# Patient Record
Sex: Female | Born: 1965 | Race: White | Hispanic: No | Marital: Married | Smoking: Never smoker
Health system: Southern US, Community
[De-identification: ages and names within clinical notes are randomized; demographics above are authoritative.]

## PROBLEM LIST (undated history)

## (undated) DIAGNOSIS — I10 Essential (primary) hypertension: Secondary | ICD-10-CM

## (undated) DIAGNOSIS — L309 Dermatitis, unspecified: Secondary | ICD-10-CM

## (undated) DIAGNOSIS — B2790 Infectious mononucleosis, unspecified without complication: Secondary | ICD-10-CM

## (undated) DIAGNOSIS — R011 Cardiac murmur, unspecified: Secondary | ICD-10-CM

## (undated) DIAGNOSIS — G4730 Sleep apnea, unspecified: Secondary | ICD-10-CM

## (undated) DIAGNOSIS — D649 Anemia, unspecified: Secondary | ICD-10-CM

## (undated) DIAGNOSIS — T8859XA Other complications of anesthesia, initial encounter: Secondary | ICD-10-CM

## (undated) DIAGNOSIS — K649 Unspecified hemorrhoids: Secondary | ICD-10-CM

## (undated) DIAGNOSIS — T783XXA Angioneurotic edema, initial encounter: Secondary | ICD-10-CM

## (undated) DIAGNOSIS — K760 Fatty (change of) liver, not elsewhere classified: Secondary | ICD-10-CM

## (undated) DIAGNOSIS — K5790 Diverticulosis of intestine, part unspecified, without perforation or abscess without bleeding: Secondary | ICD-10-CM

## (undated) DIAGNOSIS — Z8489 Family history of other specified conditions: Secondary | ICD-10-CM

## (undated) DIAGNOSIS — I4891 Unspecified atrial fibrillation: Secondary | ICD-10-CM

## (undated) DIAGNOSIS — G709 Myoneural disorder, unspecified: Secondary | ICD-10-CM

## (undated) DIAGNOSIS — E039 Hypothyroidism, unspecified: Secondary | ICD-10-CM

## (undated) DIAGNOSIS — M1990 Unspecified osteoarthritis, unspecified site: Secondary | ICD-10-CM

## (undated) DIAGNOSIS — R112 Nausea with vomiting, unspecified: Secondary | ICD-10-CM

## (undated) DIAGNOSIS — T4145XA Adverse effect of unspecified anesthetic, initial encounter: Secondary | ICD-10-CM

## (undated) DIAGNOSIS — E119 Type 2 diabetes mellitus without complications: Secondary | ICD-10-CM

## (undated) DIAGNOSIS — T7840XA Allergy, unspecified, initial encounter: Secondary | ICD-10-CM

## (undated) DIAGNOSIS — Z9889 Other specified postprocedural states: Secondary | ICD-10-CM

## (undated) HISTORY — PX: MICRODISCECTOMY LUMBAR: SUR864

## (undated) HISTORY — PX: FINGER SURGERY: SHX640

## (undated) HISTORY — DX: Myoneural disorder, unspecified: G70.9

## (undated) HISTORY — DX: Unspecified osteoarthritis, unspecified site: M19.90

## (undated) HISTORY — DX: Fatty (change of) liver, not elsewhere classified: K76.0

## (undated) HISTORY — DX: Angioneurotic edema, initial encounter: T78.3XXA

## (undated) HISTORY — DX: Allergy, unspecified, initial encounter: T78.40XA

## (undated) HISTORY — PX: WISDOM TOOTH EXTRACTION: SHX21

---

## 2004-05-11 ENCOUNTER — Ambulatory Visit: Payer: Self-pay | Admitting: Internal Medicine

## 2005-11-01 ENCOUNTER — Ambulatory Visit: Payer: Self-pay | Admitting: Internal Medicine

## 2005-11-21 ENCOUNTER — Ambulatory Visit: Payer: Self-pay | Admitting: Internal Medicine

## 2005-12-16 ENCOUNTER — Ambulatory Visit: Payer: Self-pay | Admitting: Urology

## 2006-10-31 ENCOUNTER — Ambulatory Visit: Payer: Self-pay | Admitting: Internal Medicine

## 2007-10-25 ENCOUNTER — Ambulatory Visit: Payer: Self-pay | Admitting: Internal Medicine

## 2008-11-25 ENCOUNTER — Ambulatory Visit: Payer: Self-pay | Admitting: Internal Medicine

## 2010-04-13 ENCOUNTER — Ambulatory Visit: Payer: Self-pay | Admitting: Internal Medicine

## 2010-11-08 ENCOUNTER — Ambulatory Visit: Payer: Self-pay | Admitting: Unknown Physician Specialty

## 2010-11-18 ENCOUNTER — Ambulatory Visit: Payer: Self-pay | Admitting: Unknown Physician Specialty

## 2010-11-22 LAB — PATHOLOGY REPORT

## 2010-12-16 LAB — HM COLONOSCOPY

## 2011-01-26 LAB — HM MAMMOGRAPHY

## 2011-06-14 ENCOUNTER — Ambulatory Visit: Payer: Self-pay | Admitting: Internal Medicine

## 2011-11-29 ENCOUNTER — Ambulatory Visit: Payer: Self-pay | Admitting: General Practice

## 2011-11-30 ENCOUNTER — Ambulatory Visit: Payer: Self-pay | Admitting: General Practice

## 2011-11-30 ENCOUNTER — Telehealth: Payer: Self-pay | Admitting: Internal Medicine

## 2011-11-30 NOTE — Telephone Encounter (Signed)
Pt dropped off note of her MRI and wanted you to be up dated on what was going on. Put paper in your inbox up front.

## 2011-12-01 NOTE — Telephone Encounter (Signed)
Received MRI results.  It looks like Dr Dorothey Baseman ordered.  Per note it states see ortho or a spinal surgeon.  Did her refer her to a Careers adviser.

## 2011-12-06 NOTE — Telephone Encounter (Signed)
Called pt at home and left a message

## 2011-12-07 ENCOUNTER — Other Ambulatory Visit: Payer: Self-pay | Admitting: Neurosurgery

## 2011-12-08 ENCOUNTER — Telehealth: Payer: Self-pay | Admitting: Internal Medicine

## 2011-12-08 NOTE — Telephone Encounter (Signed)
Caller: Ximena/Patient; Patient Name: Joy Houston; PCP: Dale Ceresco; Best Callback Phone Number: [phone] Got message  on home phone to call office to speak with Kourtnei, is returning call....unsure what this is about, does know she dropped off [employer] films in office last week.  Has back surgery scheduled 10/24. Patient can be reached back at  [phone].

## 2011-12-09 NOTE — Telephone Encounter (Signed)
Pt aware of results 

## 2011-12-13 ENCOUNTER — Encounter (HOSPITAL_COMMUNITY): Payer: Self-pay | Admitting: Pharmacy Technician

## 2011-12-14 ENCOUNTER — Ambulatory Visit (HOSPITAL_COMMUNITY)
Admission: RE | Admit: 2011-12-14 | Discharge: 2011-12-14 | Disposition: A | Discharge status: Home / Self Care | Payer: BC Managed Care – PPO | Source: Ambulatory Visit | Attending: Neurosurgery | Admitting: Neurosurgery

## 2011-12-14 ENCOUNTER — Encounter (HOSPITAL_COMMUNITY)
Admission: RE | Admit: 2011-12-14 | Discharge: 2011-12-14 | Disposition: A | Discharge status: Home / Self Care | Payer: BC Managed Care – PPO | Source: Ambulatory Visit | Attending: Neurosurgery | Admitting: Neurosurgery

## 2011-12-14 ENCOUNTER — Encounter (HOSPITAL_COMMUNITY): Payer: Self-pay

## 2011-12-14 DIAGNOSIS — Z01818 Encounter for other preprocedural examination: Secondary | ICD-10-CM | POA: Insufficient documentation

## 2011-12-14 HISTORY — DX: Essential (primary) hypertension: I10

## 2011-12-14 HISTORY — DX: Other complications of anesthesia, initial encounter: T88.59XA

## 2011-12-14 HISTORY — DX: Dermatitis, unspecified: L30.9

## 2011-12-14 HISTORY — DX: Other specified postprocedural states: Z98.890

## 2011-12-14 HISTORY — DX: Cardiac murmur, unspecified: R01.1

## 2011-12-14 HISTORY — DX: Unspecified hemorrhoids: K64.9

## 2011-12-14 HISTORY — DX: Family history of other specified conditions: Z84.89

## 2011-12-14 HISTORY — DX: Nausea with vomiting, unspecified: R11.2

## 2011-12-14 HISTORY — DX: Adverse effect of unspecified anesthetic, initial encounter: T41.45XA

## 2011-12-14 LAB — CBC
MCH: 30.6 pg (ref 26.0–34.0)
MCHC: 34.6 g/dL (ref 30.0–36.0)
MCV: 88.5 fL (ref 78.0–100.0)
Platelets: 258 10*3/uL (ref 150–400)
RDW: 13.2 % (ref 11.5–15.5)
WBC: 11.4 10*3/uL — ABNORMAL HIGH (ref 4.0–10.5)

## 2011-12-14 LAB — BASIC METABOLIC PANEL
CO2: 26 mEq/L (ref 19–32)
Calcium: 9.7 mg/dL (ref 8.4–10.5)
Creatinine, Ser: 0.64 mg/dL (ref 0.50–1.10)

## 2011-12-14 LAB — SURGICAL PCR SCREEN: Staphylococcus aureus: NEGATIVE

## 2011-12-14 MED ORDER — CEFAZOLIN SODIUM-DEXTROSE 2-3 GM-% IV SOLR
2.0000 g | INTRAVENOUS | Status: DC
  Administered 2011-12-15: 2 g via INTRAVENOUS
  Filled 2011-12-14: qty 50

## 2011-12-14 NOTE — Progress Notes (Signed)
Mrs Farabee said that she is having a Microdiskectomy and added the word " microdiskectomy" and signed her initials.

## 2011-12-14 NOTE — Pre-Procedure Instructions (Addendum)
[name]  12/14/2011   Your procedure is scheduled on:  Thursday, October 24th.  Report to [facility] at 8:45  AM.  Call this number if you have problems the morning of surgery: [phone]   Remember:   Do not eat food or anything to drink:After Midnight.      Take these medicines the morning of surgery with A SIP OF WATER: Hydrocodone-Acetamiohen (Norco) if needed.   Stop taking any Aspirin, Coumadin, Plavixc, Effient,NSAIDs  or Herbal Medications.  Do not wear jewelry, make-up or nail polish.  Do not wear lotions, powders, or perfumes. You may wear deodorant.  Do not shave 48 hours prior to surgery. Men may shave face and neck.  Do not bring valuables to the hospital.  Contacts, dentures or bridgework may not be worn into surgery.  Leave suitcase in the car. After surgery it may be brought to your room.  For patients admitted to the hospital, checkout time is 11:00 AM the day of discharge.   Patients discharged the day of surgery will not be allowed to drive home.  Name and phone number of your driver: NA  Special Instructions: Shower using CHG 2 nights before surgery and the night before surgery.  If you shower the day of surgery use CHG.  Use special wash - you have one bottle of CHG for all showers.  You should use approximately 1/3 of the bottle for each shower.   Please read over the following fact sheets that you were given: Pain Booklet, Coughing and Deep Breathing and Surgical Site Infection Prevention

## 2011-12-15 ENCOUNTER — Encounter (HOSPITAL_COMMUNITY)
Admission: RE | Disposition: A | Discharge status: Home / Self Care | Payer: Self-pay | Source: Ambulatory Visit | Attending: Neurosurgery

## 2011-12-15 ENCOUNTER — Encounter (HOSPITAL_COMMUNITY): Payer: Self-pay | Admitting: *Deleted

## 2011-12-15 ENCOUNTER — Encounter (HOSPITAL_COMMUNITY): Payer: Self-pay | Admitting: Anesthesiology

## 2011-12-15 ENCOUNTER — Observation Stay (HOSPITAL_COMMUNITY)
Admission: RE | Admit: 2011-12-15 | Discharge: 2011-12-16 | Disposition: A | Discharge status: Home / Self Care | Payer: BC Managed Care – PPO | Source: Ambulatory Visit | Attending: Neurosurgery | Admitting: Neurosurgery

## 2011-12-15 ENCOUNTER — Ambulatory Visit (HOSPITAL_COMMUNITY): Payer: BC Managed Care – PPO | Admitting: *Deleted

## 2011-12-15 ENCOUNTER — Ambulatory Visit (HOSPITAL_COMMUNITY): Payer: BC Managed Care – PPO

## 2011-12-15 DIAGNOSIS — Z01812 Encounter for preprocedural laboratory examination: Secondary | ICD-10-CM | POA: Insufficient documentation

## 2011-12-15 DIAGNOSIS — I10 Essential (primary) hypertension: Secondary | ICD-10-CM | POA: Insufficient documentation

## 2011-12-15 DIAGNOSIS — Z01810 Encounter for preprocedural cardiovascular examination: Secondary | ICD-10-CM | POA: Insufficient documentation

## 2011-12-15 DIAGNOSIS — Z01818 Encounter for other preprocedural examination: Secondary | ICD-10-CM | POA: Insufficient documentation

## 2011-12-15 DIAGNOSIS — M5126 Other intervertebral disc displacement, lumbar region: Principal | ICD-10-CM

## 2011-12-15 HISTORY — PX: LUMBAR LAMINECTOMY/DECOMPRESSION MICRODISCECTOMY: SHX5026

## 2011-12-15 SURGERY — LUMBAR LAMINECTOMY/DECOMPRESSION MICRODISCECTOMY 1 LEVEL
Anesthesia: General | Site: Spine Lumbar | Laterality: Right | Wound class: Clean

## 2011-12-15 MED ORDER — PHENYLEPHRINE HCL 10 MG/ML IJ SOLN
INTRAMUSCULAR | Status: DC | PRN
  Administered 2011-12-15: 80 ug via INTRAVENOUS
  Administered 2011-12-15 (×2): 40 ug via INTRAVENOUS

## 2011-12-15 MED ORDER — ACETAMINOPHEN 650 MG RE SUPP: 650.0000 mg | RECTAL | Status: DC | PRN

## 2011-12-15 MED ORDER — HYDROMORPHONE HCL PF 1 MG/ML IJ SOLN
INTRAMUSCULAR | Status: AC
  Filled 2011-12-15: qty 1

## 2011-12-15 MED ORDER — SODIUM CHLORIDE 0.9 % IV SOLN
INTRAVENOUS | Status: AC
  Filled 2011-12-15: qty 500

## 2011-12-15 MED ORDER — DOCUSATE SODIUM 100 MG PO CAPS
100.0000 mg | ORAL_CAPSULE | Freq: Two times a day (BID) | ORAL | Status: DC
  Administered 2011-12-15: 100 mg via ORAL
  Filled 2011-12-15: qty 1

## 2011-12-15 MED ORDER — SUFENTANIL CITRATE 50 MCG/ML IV SOLN
INTRAVENOUS | Status: DC | PRN
  Administered 2011-12-15 (×3): 10 ug via INTRAVENOUS
  Administered 2011-12-15: 20 ug via INTRAVENOUS

## 2011-12-15 MED ORDER — HYDROMORPHONE HCL PF 1 MG/ML IJ SOLN: 0.5000 mg | INTRAMUSCULAR | Status: DC | PRN

## 2011-12-15 MED ORDER — SODIUM CHLORIDE 0.9 % IR SOLN
Status: DC | PRN
  Administered 2011-12-15: 14:00:00

## 2011-12-15 MED ORDER — EPHEDRINE SULFATE 50 MG/ML IJ SOLN
INTRAMUSCULAR | Status: DC | PRN
  Administered 2011-12-15: 5 mg via INTRAVENOUS
  Administered 2011-12-15: 10 mg via INTRAVENOUS

## 2011-12-15 MED ORDER — ACETAMINOPHEN 325 MG PO TABS: 650.0000 mg | ORAL_TABLET | ORAL | Status: DC | PRN

## 2011-12-15 MED ORDER — ZOLPIDEM TARTRATE 5 MG PO TABS: 5.0000 mg | ORAL_TABLET | Freq: Every evening | ORAL | Status: DC | PRN

## 2011-12-15 MED ORDER — PROPOFOL 10 MG/ML IV BOLUS
INTRAVENOUS | Status: DC | PRN
  Administered 2011-12-15: 200 mg via INTRAVENOUS

## 2011-12-15 MED ORDER — HEMOSTATIC AGENTS (NO CHARGE) OPTIME
TOPICAL | Status: DC | PRN
  Administered 2011-12-15: 1 via TOPICAL

## 2011-12-15 MED ORDER — BUPIVACAINE-EPINEPHRINE PF 0.5-1:200000 % IJ SOLN
INTRAMUSCULAR | Status: DC | PRN
  Administered 2011-12-15: 10 mL

## 2011-12-15 MED ORDER — ROCURONIUM BROMIDE 100 MG/10ML IV SOLN
INTRAVENOUS | Status: DC | PRN
  Administered 2011-12-15: 50 mg via INTRAVENOUS

## 2011-12-15 MED ORDER — ONDANSETRON HCL 4 MG/2ML IJ SOLN: 4.0000 mg | INTRAMUSCULAR | Status: DC | PRN

## 2011-12-15 MED ORDER — HYDROCHLOROTHIAZIDE 25 MG PO TABS
25.0000 mg | ORAL_TABLET | Freq: Every day | ORAL | Status: DC
  Filled 2011-12-15 (×2): qty 1

## 2011-12-15 MED ORDER — ONDANSETRON HCL 4 MG/2ML IJ SOLN
INTRAMUSCULAR | Status: DC | PRN
  Administered 2011-12-15: 4 mg via INTRAVENOUS

## 2011-12-15 MED ORDER — 0.9 % SODIUM CHLORIDE (POUR BTL) OPTIME
TOPICAL | Status: DC | PRN
  Administered 2011-12-15: 1000 mL

## 2011-12-15 MED ORDER — BACITRACIN 50000 UNITS IM SOLR
INTRAMUSCULAR | Status: AC
  Filled 2011-12-15: qty 1

## 2011-12-15 MED ORDER — HYDROMORPHONE HCL PF 1 MG/ML IJ SOLN
0.2500 mg | INTRAMUSCULAR | Status: DC | PRN
  Administered 2011-12-15 (×2): 0.5 mg via INTRAVENOUS

## 2011-12-15 MED ORDER — THROMBIN 5000 UNITS EX KIT
PACK | CUTANEOUS | Status: DC | PRN
  Administered 2011-12-15 (×2): 5000 [IU] via TOPICAL

## 2011-12-15 MED ORDER — BACITRACIN ZINC 500 UNIT/GM EX OINT
TOPICAL_OINTMENT | CUTANEOUS | Status: DC | PRN
  Administered 2011-12-15: 1 via TOPICAL

## 2011-12-15 MED ORDER — HYDROCODONE-ACETAMINOPHEN 5-325 MG PO TABS
1.0000 | ORAL_TABLET | ORAL | Status: DC | PRN
  Administered 2011-12-15 – 2011-12-16 (×5): 1 via ORAL
  Filled 2011-12-15 (×5): qty 1

## 2011-12-15 MED ORDER — ONDANSETRON HCL 4 MG/2ML IJ SOLN: 4.0000 mg | Freq: Once | INTRAMUSCULAR | Status: DC | PRN

## 2011-12-15 MED ORDER — OXYCODONE-ACETAMINOPHEN 5-325 MG PO TABS: 1.0000 | ORAL_TABLET | ORAL | Status: DC | PRN

## 2011-12-15 MED ORDER — DROPERIDOL 2.5 MG/ML IJ SOLN
INTRAMUSCULAR | Status: DC | PRN
  Administered 2011-12-15: 0.625 mg via INTRAVENOUS

## 2011-12-15 MED ORDER — NEOSTIGMINE METHYLSULFATE 1 MG/ML IJ SOLN
INTRAMUSCULAR | Status: DC | PRN
  Administered 2011-12-15: 3 mg via INTRAVENOUS

## 2011-12-15 MED ORDER — LACTATED RINGERS IV SOLN
INTRAVENOUS | Status: DC | PRN
  Administered 2011-12-15 (×2): via INTRAVENOUS

## 2011-12-15 MED ORDER — LACTATED RINGERS IV SOLN: INTRAVENOUS | Status: DC

## 2011-12-15 MED ORDER — LISINOPRIL 10 MG PO TABS
10.0000 mg | ORAL_TABLET | Freq: Every day | ORAL | Status: DC
  Filled 2011-12-15 (×2): qty 1

## 2011-12-15 MED ORDER — DIAZEPAM 5 MG PO TABS: 5.0000 mg | ORAL_TABLET | Freq: Four times a day (QID) | ORAL | Status: DC | PRN

## 2011-12-15 MED ORDER — PHENOL 1.4 % MT LIQD: 1.0000 | OROMUCOSAL | Status: DC | PRN

## 2011-12-15 MED ORDER — MIDAZOLAM HCL 5 MG/5ML IJ SOLN
INTRAMUSCULAR | Status: DC | PRN
  Administered 2011-12-15: 2 mg via INTRAVENOUS

## 2011-12-15 MED ORDER — GLYCOPYRROLATE 0.2 MG/ML IJ SOLN
INTRAMUSCULAR | Status: DC | PRN
  Administered 2011-12-15: 0.4 mg via INTRAVENOUS

## 2011-12-15 MED ORDER — MENTHOL 3 MG MT LOZG
1.0000 | LOZENGE | OROMUCOSAL | Status: DC | PRN
  Administered 2011-12-15: 3 mg via ORAL
  Filled 2011-12-15: qty 9

## 2011-12-15 MED ORDER — LIDOCAINE HCL 4 % MT SOLN
OROMUCOSAL | Status: DC | PRN
  Administered 2011-12-15: 4 mL via TOPICAL

## 2011-12-15 MED ORDER — CEFAZOLIN SODIUM-DEXTROSE 2-3 GM-% IV SOLR
2.0000 g | Freq: Three times a day (TID) | INTRAVENOUS | Status: AC
  Administered 2011-12-15 – 2011-12-16 (×2): 2 g via INTRAVENOUS
  Filled 2011-12-15 (×2): qty 50

## 2011-12-15 MED ORDER — LIDOCAINE HCL (CARDIAC) 20 MG/ML IV SOLN
INTRAVENOUS | Status: DC | PRN
  Administered 2011-12-15: 80 mg via INTRAVENOUS

## 2011-12-15 SURGICAL SUPPLY — 56 items
BAG DECANTER FOR FLEXI CONT (MISCELLANEOUS) ×2 IMPLANT
BENZOIN TINCTURE PRP APPL 2/3 (GAUZE/BANDAGES/DRESSINGS) ×2 IMPLANT
BLADE SURG ROTATE 9660 (MISCELLANEOUS) ×2 IMPLANT
BRUSH SCRUB EZ PLAIN DRY (MISCELLANEOUS) ×2 IMPLANT
BUR ACORN 6.0 (BURR) ×2 IMPLANT
BUR MATCHSTICK NEURO 3.0 LAGG (BURR) ×2 IMPLANT
CANISTER SUCTION 2500CC (MISCELLANEOUS) ×2 IMPLANT
CLOTH BEACON ORANGE TIMEOUT ST (SAFETY) ×2 IMPLANT
CONT SPEC 4OZ CLIKSEAL STRL BL (MISCELLANEOUS) ×2 IMPLANT
DRAPE LAPAROTOMY 100X72X124 (DRAPES) ×2 IMPLANT
DRAPE MICROSCOPE LEICA (MISCELLANEOUS) ×2 IMPLANT
DRAPE POUCH INSTRU U-SHP 10X18 (DRAPES) ×2 IMPLANT
DRAPE SURG 17X23 STRL (DRAPES) ×8 IMPLANT
ELECT BLADE 4.0 EZ CLEAN MEGAD (MISCELLANEOUS) ×2
ELECT BLADE 6.5 EXT (BLADE) ×2 IMPLANT
ELECT REM PT RETURN 9FT ADLT (ELECTROSURGICAL) ×2
ELECTRODE BLDE 4.0 EZ CLN MEGD (MISCELLANEOUS) ×1 IMPLANT
ELECTRODE REM PT RTRN 9FT ADLT (ELECTROSURGICAL) ×1 IMPLANT
GAUZE SPONGE 4X4 16PLY XRAY LF (GAUZE/BANDAGES/DRESSINGS) IMPLANT
GLOVE BIO SURGEON STRL SZ8.5 (GLOVE) ×2 IMPLANT
GLOVE BIOGEL PI IND STRL 7.0 (GLOVE) ×1 IMPLANT
GLOVE BIOGEL PI IND STRL 8 (GLOVE) ×1 IMPLANT
GLOVE BIOGEL PI INDICATOR 7.0 (GLOVE) ×1
GLOVE BIOGEL PI INDICATOR 8 (GLOVE) ×1
GLOVE ECLIPSE 7.5 STRL STRAW (GLOVE) ×2 IMPLANT
GLOVE EXAM NITRILE LRG STRL (GLOVE) IMPLANT
GLOVE EXAM NITRILE MD LF STRL (GLOVE) ×2 IMPLANT
GLOVE EXAM NITRILE XL STR (GLOVE) IMPLANT
GLOVE EXAM NITRILE XS STR PU (GLOVE) IMPLANT
GLOVE SS BIOGEL STRL SZ 8 (GLOVE) ×1 IMPLANT
GLOVE SUPERSENSE BIOGEL SZ 8 (GLOVE) ×1
GLOVE SURG SS PI 6.5 STRL IVOR (GLOVE) ×4 IMPLANT
GOWN BRE IMP SLV AUR LG STRL (GOWN DISPOSABLE) ×4 IMPLANT
GOWN BRE IMP SLV AUR XL STRL (GOWN DISPOSABLE) ×2 IMPLANT
GOWN STRL REIN 2XL LVL4 (GOWN DISPOSABLE) IMPLANT
KIT BASIN OR (CUSTOM PROCEDURE TRAY) ×2 IMPLANT
KIT ROOM TURNOVER OR (KITS) ×2 IMPLANT
NEEDLE HYPO 21X1.5 SAFETY (NEEDLE) IMPLANT
NEEDLE HYPO 22GX1.5 SAFETY (NEEDLE) ×2 IMPLANT
NS IRRIG 1000ML POUR BTL (IV SOLUTION) ×2 IMPLANT
PACK LAMINECTOMY NEURO (CUSTOM PROCEDURE TRAY) ×2 IMPLANT
PAD ARMBOARD 7.5X6 YLW CONV (MISCELLANEOUS) ×10 IMPLANT
PATTIES SURGICAL .5 X1 (DISPOSABLE) IMPLANT
RUBBERBAND STERILE (MISCELLANEOUS) ×4 IMPLANT
SPONGE GAUZE 4X4 12PLY (GAUZE/BANDAGES/DRESSINGS) ×2 IMPLANT
SPONGE SURGIFOAM ABS GEL SZ50 (HEMOSTASIS) ×2 IMPLANT
STRIP CLOSURE SKIN 1/2X4 (GAUZE/BANDAGES/DRESSINGS) ×2 IMPLANT
SUT VIC AB 1 CT1 18XBRD ANBCTR (SUTURE) ×1 IMPLANT
SUT VIC AB 1 CT1 8-18 (SUTURE) ×1
SUT VIC AB 2-0 CP2 18 (SUTURE) ×2 IMPLANT
SYR 20CC LL (SYRINGE) IMPLANT
SYR 20ML ECCENTRIC (SYRINGE) ×2 IMPLANT
TAPE CLOTH SURG 4X10 WHT LF (GAUZE/BANDAGES/DRESSINGS) ×2 IMPLANT
TOWEL OR 17X24 6PK STRL BLUE (TOWEL DISPOSABLE) ×2 IMPLANT
TOWEL OR 17X26 10 PK STRL BLUE (TOWEL DISPOSABLE) ×2 IMPLANT
WATER STERILE IRR 1000ML POUR (IV SOLUTION) ×2 IMPLANT

## 2011-12-15 NOTE — Anesthesia Postprocedure Evaluation (Signed)
  Anesthesia Post-op Note  Patient: Joy Houston  Procedure(s) Performed: Procedure(s) (LRB) with comments: LUMBAR LAMINECTOMY/DECOMPRESSION MICRODISCECTOMY 1 LEVEL (Right) - RIGHT Lumbar four-Five diskectomy  Patient Location: PACU  Anesthesia Type: General  Level of Consciousness: awake, alert , oriented and patient cooperative  Airway and Oxygen Therapy: Patient Spontanous Breathing  Post-op Pain: mild  Post-op Assessment: Post-op Vital signs reviewed, Patient's Cardiovascular Status Stable, Respiratory Function Stable, Patent Airway, No signs of Nausea or vomiting and Pain level controlled  Post-op Vital Signs: stable  Complications: No apparent anesthesia complications

## 2011-12-15 NOTE — Anesthesia Preprocedure Evaluation (Addendum)
Anesthesia Evaluation  Patient identified by MRN, date of birth, ID band Patient awake    Reviewed: Allergy & Precautions, H&P , NPO status , Patient's Chart, lab work & pertinent test results  History of Anesthesia Complications (+) PONV and Family history of anesthesia reaction  Airway Mallampati: I TM Distance: >3 FB Neck ROM: full    Dental   Pulmonary          Cardiovascular Exercise Tolerance: Good hypertension, Pt. on medications + Valvular Problems/Murmurs Rhythm:regular Rate:Normal  Trivial murmer since childhood no prophylaxis required    Neuro/Psych Right leg pain and numbness     GI/Hepatic ? Fatty liver    Endo/Other    Renal/GU      Musculoskeletal   Abdominal   Peds  Hematology   Anesthesia Other Findings   Reproductive/Obstetrics                          Anesthesia Physical Anesthesia Plan  ASA: II  Anesthesia Plan: General   Post-op Pain Management:    Induction: Intravenous  Airway Management Planned: Oral ETT  Additional Equipment:   Intra-op Plan:   Post-operative Plan: Extubation in OR  Informed Consent: I have reviewed the patients History and Physical, chart, labs and discussed the procedure including the risks, benefits and alternatives for the proposed anesthesia with the patient or authorized representative who has indicated his/her understanding and acceptance.   Dental advisory given  Plan Discussed with: CRNA, Anesthesiologist and Surgeon  Anesthesia Plan Comments:        Anesthesia Quick Evaluation

## 2011-12-15 NOTE — Transfer of Care (Signed)
Immediate Anesthesia Transfer of Care Note  Patient: Joy Houston  Procedure(s) Performed: Procedure(s) (LRB) with comments: LUMBAR LAMINECTOMY/DECOMPRESSION MICRODISCECTOMY 1 LEVEL (Right) - RIGHT Lumbar four-Five diskectomy  Patient Location: PACU  Anesthesia Type: General  Level of Consciousness: awake, alert  and oriented  Airway & Oxygen Therapy: Patient Spontanous Breathing and Patient connected to face mask oxygen  Post-op Assessment: Report given to PACU RN  Post vital signs: Reviewed and stable  Complications: No apparent anesthesia complications

## 2011-12-15 NOTE — Progress Notes (Signed)
Patient ID: Joy Houston, female   DOB: Feb 13, 1966, 46 y.o.   MRN: [id number] Subjective:  The patient is alert and pleasant. She looks well.  Objective: Vital signs in last 24 hours: Temp:  [97.6 F (36.4 C)-98 F (36.7 C)] 97.6 F (36.4 C) (10/24 1504) Pulse Rate:  [79] 79  (10/24 0900) Resp:  [20] 20  (10/24 0900) BP: (126)/(71) 126/71 mmHg (10/24 0900) SpO2:  [97 %-100 %] 100 % (10/24 1504)  Intake/Output from previous day:   Intake/Output this shift: Total I/O In: 1700 [I.V.:1700] Out: 100 [Blood:100]  Physical exam the patient is alert and pleasant. She is moving all 4 extremities well.  Lab Results:  Basename 12/14/11 1524  WBC 11.4*  HGB 14.7  HCT 42.5  PLT 258   BMET  Basename 12/14/11 1524  NA 136  K 3.4*  CL 97  CO2 26  GLUCOSE 137*  BUN 11  CREATININE 0.64  CALCIUM 9.7    Studies/Results: Dg Chest 2 View  12/14/2011  *RADIOLOGY REPORT*  Clinical Data: Preop lumbar discectomy  CHEST - 2 VIEW  Comparison: None.  Findings: Lungs are clear. No pleural effusion or pneumothorax.  Cardiomediastinal silhouette is within normal limits.  Visualized osseous structures are within normal limits.  IMPRESSION: No evidence of acute cardiopulmonary disease.   Original Report Authenticated By: Charline Bills, M.D.    Dg Lumbar Spine 1 View  12/15/2011  *RADIOLOGY REPORT*  Clinical Data: For lumbar laminectomy and microdiskectomy  LUMBAR SPINE - 1 VIEW  Comparison: None  Findings: Single lateral view shows a surgical probe extending to the skin retractors.  The tip lies posterior to the L4-L5 disc interspace.  There is normal vertebral body stature and alignment.  IMPRESSION: Lateral lumbar radiograph for surgical localization   Original Report Authenticated By: Domenic Moras, M.D.     Assessment/Plan: The patient is doing well.  LOS: 0 days     Elye Harmsen D 12/15/2011, 3:56 PM

## 2011-12-15 NOTE — Op Note (Signed)
Brief history: The patient is a 46 year old white female who has suffered from back and right leg pain consistent with a L5 radiculopathy. She has failed medical management and was worked up with a lumbar MRI. This demonstrated a herniated disc at L4-5 on the right. I discussed the various treatment options with the patient including surgery. She has weighed the risks, benefits, and alternatives surgery and decided proceed with a right L4-5 discectomy.  Preoperative diagnosis: Right L4-5 herniated disc, spinal stenosis, lumbar radiculopathy, lumbago  Postoperative diagnosis: The same  Procedure: Right L4-5 Intervertebral discectomy using micro-dissection  Surgeon: Dr. Delma Officer  Asst.: Dr. Shirlean Kelly  Anesthesia: Gen. endotracheal  Estimated blood loss: 25 cc  Drains: None  Complications: None  Description of procedure: The patient was brought to the operating room by the anesthesia team. General endotracheal anesthesia was induced. The patient was turned to the prone position on the Wilson frame. The patient's lumbosacral region was then prepared with Betadine scrub and Betadine solution. Sterile drapes were applied.  I then injected the area to be incised with Marcaine with epinephrine solution. I then used a scalpel to make a linear midline incision over the L4-5 intervertebral disc space. I then used electrocautery to perform a right sided subperiosteal dissection exposing the spinous process and lamina of L4 and L5. We obtained intraoperative radiograph to confirm our location. I then inserted the [facility] retractor for exposure.  We then brought the operative microscope into the field. Under its magnification and illumination we completed the microdissection. I used a high-speed drill to perform a laminotomy at L4 on the right. I then used a Kerrison punches to widen the laminotomy and removed the ligamentum flavum at right L4-5. We then used microdissection to free up the  thecal sac and the right L5 nerve root from the epidural tissue. I then used a Kerrison punch to perform a foraminotomy at about the right L5 nerve root. We then using the nerve root retractor to gently retract the thecal sac and the right L5 nerve root medially. This exposed the intervertebral disc. We identified the ruptured disc and remove it with the pituitary forceps. We inspected the intervertebral disc at L4-5. There was a small hole in the annulus but did not appear to be any impending herniations. We therefore did not enter into the intervertebral disc space.  I then palpated along the ventral surface of the thecal sac and along exit route of the right L5 nerve root and noted that the neural structures were well decompressed. This completed the decompression.  We then obtained hemostasis using bipolar electrocautery. We irrigated the wound out with bacitracin solution. We then removed the retractor. We then reapproximated the patient's thoracolumbar fascia with interrupted #1 Vicryl suture. We then reapproximated the patient's subcutaneous tissue with interrupted 3-0 Vicryl suture. We then reapproximated patient's skin with Steri-Strips and benzoin. The was then coated with bacitracin ointment. The drapes were removed. The patient was subsequently returned to the supine position where they were extubated by the anesthesia team. The patient was then transported to the postanesthesia care unit in stable condition. All sponge instrument and needle counts were reportedly correct at the end of this case.

## 2011-12-15 NOTE — H&P (Signed)
Subjective: The patient is a 46 year old white female who has suffered from back and right leg pain consistent with an L5 radiculopathy. She has failed medical management and was worked up with a lumbar MRI. This demonstrated patient had a herniated disc at L4-5 on the right. I discussed the various treatment options with the patient including surgery. The patient has weighed the risks, benefits, and alternatives surgery decided proceed with a right L4-5 discectomy.   Past Medical History  Diagnosis Date  . Complication of anesthesia   . PONV (postoperative nausea and vomiting)   . Family history of anesthesia complication     Mother - confusion  . Hypertension   . Heart murmur     Slight - "nothing to worry about"  . Hemorrhoid   . Eczema     Past Surgical History  Procedure Date  . Cesarean section   . Finger surgery     pin to 3rd finger Right hand  . Wisdom tooth extraction     Allergies  Allergen Reactions  . Morphine And Related     Slight itching around nose area    History  Substance Use Topics  . Smoking status: Never Smoker   . Smokeless tobacco: Not on file  . Alcohol Use: No    History reviewed. No pertinent family history. Prior to Admission medications   Medication Sig Start Date End Date Taking? Authorizing Provider  HYDROcodone-acetaminophen (NORCO) 10-325 MG per tablet Take 0.5-1 tablets by mouth every 4 (four) hours as needed. For pain   Yes Historical Provider, MD  hydrochlorothiazide (HYDRODIURIL) 25 MG tablet Take 25 mg by mouth daily.    Historical Provider, MD  lisinopril (PRINIVIL,ZESTRIL) 10 MG tablet Take 10 mg by mouth daily.    Historical Provider, MD     Review of Systems  Positive ROS: As above  All other systems have been reviewed and were otherwise negative with the exception of those mentioned in the HPI and as above.  Objective: Vital signs in last 24 hours: Temp:  [98 F (36.7 C)-98.8 F (37.1 C)] 98 F (36.7 C) (10/24  0900) Pulse Rate:  [79-104] 79  (10/24 0900) Resp:  [20] 20  (10/24 0900) BP: (126-146)/(71-85) 126/71 mmHg (10/24 0900) SpO2:  [96 %-97 %] 97 % (10/24 0900) Weight:  [112.4 kg (247 lb 12.8 oz)] 112.4 kg (247 lb 12.8 oz) (10/23 1459)  General Appearance: Alert, cooperative, no distress, appears stated age Head: Normocephalic, without obvious abnormality, atraumatic Eyes: PERRL, conjunctiva/corneas clear, EOM's intact, fundi benign, both eyes      Ears: Normal TM's and external ear canals, both ears Throat: Lips, mucosa, and tongue normal; teeth and gums normal Neck: Supple, symmetrical, trachea midline, no adenopathy; thyroid: No enlargement/tenderness/nodules; no carotid bruit or JVD Back: Symmetric, no curvature, ROM normal, no CVA tenderness Lungs: Clear to auscultation bilaterally, respirations unlabored Heart: Regular rate and rhythm, S1 and S2 normal, no murmur, rub or gallop Abdomen: Soft, non-tender, bowel sounds active all four quadrants, no masses, no organomegaly Extremities: Extremities normal, atraumatic, no cyanosis or edema Pulses: 2+ and symmetric all extremities Skin: Skin color, texture, turgor normal, no rashes or lesions  NEUROLOGIC:   Mental status: alert and oriented, no aphasia, good attention span, Fund of knowledge/ memory ok Motor Exam - grossly normal except that she has weakness in the right extensor hallicus longus/dorsiflexors Sensory Exam - grossly normal except she has numbness in the right L5 distribution. Reflexes: Symmetric Coordination - grossly normal Gait - grossly  normal Balance - grossly normal Cranial Nerves: I: smell Not tested  II: visual acuity  OS:  normal    OD: Normal   II: visual fields Full to confrontation  II: pupils Equal, round, reactive to light  III,VII: ptosis None  III,IV,VI: extraocular muscles  Full ROM  V: mastication Normal  V: facial light touch sensation  Normal  V,VII: corneal reflex  Present  VII: facial muscle  function - upper  Normal  VII: facial muscle function - lower Normal  VIII: hearing Not tested  IX: soft palate elevation  Normal  IX,X: gag reflex Present  XI: trapezius strength  5/5  XI: sternocleidomastoid strength 5/5  XI: neck flexion strength  5/5  XII: tongue strength  Normal    Data Review Lab Results  Component Value Date   WBC 11.4* 12/14/2011   HGB 14.7 12/14/2011   HCT 42.5 12/14/2011   MCV 88.5 12/14/2011   PLT 258 12/14/2011   Lab Results  Component Value Date   NA 136 12/14/2011   K 3.4* 12/14/2011   CL 97 12/14/2011   CO2 26 12/14/2011   BUN 11 12/14/2011   CREATININE 0.64 12/14/2011   GLUCOSE 137* 12/14/2011   No results found for this basename: INR, PROTIME    Assessment/Plan: Right L4-5 disc herniation, lumbago, lumbar radiculopathy: I discussed situation with the patient. I reviewed her MR scan with her and pointed out the abnormalities. We have discussed the various treatment options including surgery. I described the surgical option of a right L4-5 discectomy. I've shown her surgical models. We have discussed the risks, benefits, alternatives, and likelihood of achieving our goals with surgery. I've answered all the patient's questions. She wants to proceed with surgery.   Tressie Stalker D 12/15/2011 12:58 PM

## 2011-12-15 NOTE — Plan of Care (Signed)
Problem: Consults Goal: Diagnosis - Spinal Surgery Outcome: Completed/Met Date Met:  12/15/11 Microdiscectomy     

## 2011-12-16 MED ORDER — DSS 100 MG PO CAPS: 100.0000 mg | ORAL_CAPSULE | Freq: Two times a day (BID) | ORAL | Status: DC

## 2011-12-16 MED ORDER — OXYCODONE-ACETAMINOPHEN 10-325 MG PO TABS: 1.0000 | ORAL_TABLET | ORAL | Status: DC | PRN

## 2011-12-16 MED ORDER — DIAZEPAM 5 MG PO TABS: 5.0000 mg | ORAL_TABLET | Freq: Four times a day (QID) | ORAL | Status: DC | PRN

## 2011-12-16 NOTE — Discharge Summary (Signed)
  Physician Discharge Summary  Patient ID: Joy Houston MRN: [id number] DOB/AGE: 02-25-1965 46 y.o.  Admit date: 12/15/2011 Discharge date: 12/16/2011  Admission Diagnoses: Right L4-5 herniated disc, lumbago, lumbar radiculopathy  Discharge Diagnoses: The same Principal Problem:  *Lumbar herniated disc   Discharged Condition: good  Hospital Course: I admitted the patient to [facility] on 10/24 13. On that day I performed a right L4-5 discectomy. The surgery went well. The patient's postoperative course was unremarkable. By postop day #1 she was requesting discharge to home. She was given oral and written discharge instructions. All her questions were answered.  Consults: None Significant Diagnostic Studies: None Treatments: Right L4-5 discectomy using microdissection. Discharge Exam: Blood pressure 109/74, pulse 75, temperature 98.9 F (37.2 C), temperature source Oral, resp. rate 20, last menstrual period 12/15/2010, SpO2 99.00%. The patient is alert and pleasant. She looks well. She is moving her lower extremities well.  Disposition: Home  Discharge Orders    Future Appointments: Provider: Department: Dept Phone: Center:   01/12/2012 3:30 PM Charm Barges, MD [facility]- [phone] None       Medication List     As of 12/16/2011  9:31 AM    STOP taking these medications         HYDROcodone-acetaminophen 10-325 MG per tablet   Commonly known as: NORCO      TAKE these medications         diazepam 5 MG tablet   Commonly known as: VALIUM   Take 1 tablet (5 mg total) by mouth every 6 (six) hours as needed.      DSS 100 MG Caps   Take 100 mg by mouth 2 (two) times daily.      hydrochlorothiazide 25 MG tablet   Commonly known as: HYDRODIURIL   Take 25 mg by mouth daily.      lisinopril 10 MG tablet   Commonly known as: PRINIVIL,ZESTRIL   Take 10 mg by mouth daily.      oxyCODONE-acetaminophen 10-325 MG per tablet   Commonly known as:  PERCOCET   Take 1 tablet by mouth every 4 (four) hours as needed for pain.         SignedCristi Loron 12/16/2011, 9:31 AM

## 2011-12-19 ENCOUNTER — Encounter (HOSPITAL_COMMUNITY): Payer: Self-pay | Admitting: Neurosurgery

## 2012-01-04 ENCOUNTER — Ambulatory Visit (INDEPENDENT_AMBULATORY_CARE_PROVIDER_SITE_OTHER): Payer: BC Managed Care – PPO | Admitting: Internal Medicine

## 2012-01-04 ENCOUNTER — Encounter: Payer: Self-pay | Admitting: Internal Medicine

## 2012-01-04 VITALS — BP 122/82 | HR 90 | Temp 98.5°F | Ht 67.0 in | Wt 252.0 lb

## 2012-01-04 DIAGNOSIS — R945 Abnormal results of liver function studies: Secondary | ICD-10-CM

## 2012-01-04 DIAGNOSIS — R319 Hematuria, unspecified: Secondary | ICD-10-CM

## 2012-01-04 DIAGNOSIS — R7989 Other specified abnormal findings of blood chemistry: Secondary | ICD-10-CM

## 2012-01-04 DIAGNOSIS — R739 Hyperglycemia, unspecified: Secondary | ICD-10-CM

## 2012-01-04 DIAGNOSIS — R7309 Other abnormal glucose: Secondary | ICD-10-CM

## 2012-01-04 DIAGNOSIS — M5126 Other intervertebral disc displacement, lumbar region: Secondary | ICD-10-CM

## 2012-01-04 DIAGNOSIS — D72829 Elevated white blood cell count, unspecified: Secondary | ICD-10-CM

## 2012-01-04 DIAGNOSIS — I10 Essential (primary) hypertension: Secondary | ICD-10-CM | POA: Insufficient documentation

## 2012-01-04 DIAGNOSIS — K769 Liver disease, unspecified: Secondary | ICD-10-CM

## 2012-01-04 DIAGNOSIS — E7800 Pure hypercholesterolemia, unspecified: Secondary | ICD-10-CM

## 2012-01-04 MED ORDER — HYDROCORTISONE ACETATE 25 MG RE SUPP: 25.0000 mg | Freq: Two times a day (BID) | RECTAL | Status: DC

## 2012-01-04 MED ORDER — LOTEPREDNOL ETABONATE 0.5 % OP SUSP: 1.0000 [drp] | Freq: Four times a day (QID) | OPHTHALMIC | Status: DC

## 2012-01-04 NOTE — Patient Instructions (Addendum)
It was nice seeing you today.  I am glad to hear your surgery went well.  We will get a scheduled appt for you to return for your lab work.  Let me know if you need anything.

## 2012-01-05 ENCOUNTER — Other Ambulatory Visit (INDEPENDENT_AMBULATORY_CARE_PROVIDER_SITE_OTHER): Payer: BC Managed Care – PPO

## 2012-01-05 DIAGNOSIS — E7800 Pure hypercholesterolemia, unspecified: Secondary | ICD-10-CM

## 2012-01-05 DIAGNOSIS — D72829 Elevated white blood cell count, unspecified: Secondary | ICD-10-CM

## 2012-01-05 DIAGNOSIS — R945 Abnormal results of liver function studies: Secondary | ICD-10-CM

## 2012-01-05 DIAGNOSIS — R739 Hyperglycemia, unspecified: Secondary | ICD-10-CM

## 2012-01-05 DIAGNOSIS — R7989 Other specified abnormal findings of blood chemistry: Secondary | ICD-10-CM

## 2012-01-05 DIAGNOSIS — R7309 Other abnormal glucose: Secondary | ICD-10-CM

## 2012-01-05 DIAGNOSIS — I10 Essential (primary) hypertension: Secondary | ICD-10-CM

## 2012-01-05 LAB — BASIC METABOLIC PANEL
Calcium: 9.2 mg/dL (ref 8.4–10.5)
Chloride: 103 mEq/L (ref 96–112)
Creatinine, Ser: 0.7 mg/dL (ref 0.4–1.2)
Sodium: 137 mEq/L (ref 135–145)

## 2012-01-05 LAB — LIPID PANEL
Cholesterol: 199 mg/dL (ref 0–200)
Triglycerides: 145 mg/dL (ref 0.0–149.0)
VLDL: 29 mg/dL (ref 0.0–40.0)

## 2012-01-05 LAB — CBC WITH DIFFERENTIAL/PLATELET
Basophils Relative: 0.6 % (ref 0.0–3.0)
Eosinophils Absolute: 0.1 10*3/uL (ref 0.0–0.7)
Hemoglobin: 13.7 g/dL (ref 12.0–15.0)
MCHC: 33.6 g/dL (ref 30.0–36.0)
MCV: 91.2 fl (ref 78.0–100.0)
Monocytes Absolute: 0.6 10*3/uL (ref 0.1–1.0)
Neutro Abs: 6.2 10*3/uL (ref 1.4–7.7)
RBC: 4.48 Mil/uL (ref 3.87–5.11)

## 2012-01-05 LAB — HEPATIC FUNCTION PANEL
Albumin: 4.1 g/dL (ref 3.5–5.2)
Total Protein: 7.2 g/dL (ref 6.0–8.3)

## 2012-01-05 LAB — HEMOGLOBIN A1C: Hgb A1c MFr Bld: 5.8 % (ref 4.6–6.5)

## 2012-01-05 MED ORDER — HYDROCORTISONE ACETATE 25 MG RE SUPP: 25.0000 mg | Freq: Two times a day (BID) | RECTAL | Status: DC

## 2012-01-06 ENCOUNTER — Ambulatory Visit: Payer: BC Managed Care – PPO | Admitting: Internal Medicine

## 2012-01-06 ENCOUNTER — Telehealth: Payer: Self-pay | Admitting: Internal Medicine

## 2012-01-06 NOTE — Telephone Encounter (Signed)
Per my last office visit note, pt was supposed to schedule a physical in 2 months (around 03/06/12).  I don't see where this has been scheduled.  Please schedule her a physical.   Thanks.

## 2012-01-08 ENCOUNTER — Encounter: Payer: Self-pay | Admitting: Internal Medicine

## 2012-01-08 DIAGNOSIS — R319 Hematuria, unspecified: Secondary | ICD-10-CM | POA: Insufficient documentation

## 2012-01-08 DIAGNOSIS — E119 Type 2 diabetes mellitus without complications: Secondary | ICD-10-CM | POA: Insufficient documentation

## 2012-01-08 DIAGNOSIS — R945 Abnormal results of liver function studies: Secondary | ICD-10-CM | POA: Insufficient documentation

## 2012-01-08 DIAGNOSIS — E7800 Pure hypercholesterolemia, unspecified: Secondary | ICD-10-CM | POA: Insufficient documentation

## 2012-01-08 DIAGNOSIS — E782 Mixed hyperlipidemia: Secondary | ICD-10-CM | POA: Insufficient documentation

## 2012-01-08 NOTE — Assessment & Plan Note (Signed)
Presumed to be fatty liver.  Have discussed need for weight loss.  Check liver panel

## 2012-01-08 NOTE — Assessment & Plan Note (Signed)
Saw Dr Stoioff.  Follow urinalysis.    

## 2012-01-08 NOTE — Progress Notes (Signed)
  Subjective:    Patient ID: Joy Houston, female    DOB: 1965-05-17, 46 y.o.   MRN: [id number]  HPI 46 year old female with past history of gestational diabetes, hypertension and presumed fatty liver who comes in today to follow up on these issues as well as for a complete physical exam.  She just recently had back surgery for a ruptured disc.  Is doing well.  Has not returned to work.  Seeing Dr Lovell Sheehan.  She has also seen an ophthalmologist for an allergic reaction/infection in her eye.  Has used Lotemax.  Was requesting a refill for this.  Explained to her that she would need to get this from her eye doctor.  She does request some Annusol HC suppositories.  Some trouble occasionally with hemorrhoid flares.  Bowels doing relatively well now.    Past Medical History  Diagnosis Date  . Complication of anesthesia   . PONV (postoperative nausea and vomiting)   . Family history of anesthesia complication     Mother - confusion  . Hypertension   . Heart murmur     Slight - "nothing to worry about"  . Hemorrhoid   . Eczema     Review of Systems Patient denies any headache, lightheadedness or dizziness.  No significant sinus or allergy symptoms.  No chest pain, tightness or palpitations.  No increased shortness of breath, cough or congestion.  No nausea or vomiting.  No abdominal pain or cramping.  No bowel change, such as diarrhea, constipation, BRBPR or melana.  No urine change.        Objective:   Physical Exam Filed Vitals:   01/04/12 1310  BP: 122/82  Pulse: 90  Temp: 98.5 F (93.74 C)   46 year old female in no acute distress.   HEENT:  Nares - clear.  OP- without lesions or erythema.  NECK:  Supple, nontender.  No audible bruit.   HEART:  Appears to be regular. LUNGS:  Without crackles or wheezing audible.  Respirations even and unlabored.   RADIAL PULSE:  Equal bilaterally.  ABDOMEN:  Soft, nontender.  No audible abdominal bruit. BACK.  Well healed incision site.  No  increased erythema.    EXTREMITIES:  No increased edema to be present.                     Assessment & Plan:  MSK.  S/P recent back surgery.  Following with Dr Lovell Sheehan.  Doing well.    HEMORRHOIDS.  Refilled anusol HC suppositories to have if needed.  Notify me if persistent problems.    GI.  Colonoscopy 12/16/10 revealed diverticulosis, internal hemorrhoids and one 3mm polyp in the proximal sigmoid and one 5mm polyp in the distal sigmoid.  Bowels currently stable.  Follow.    GYN.  Saw Dr Harold Hedge.  S/p Novasure ablation - 9/12.  Follow.    HEALTH MAINTENANCE.  Physical 01/26/11.  Need to obtain records from last mammogram.  Colonoscopy as outlined.

## 2012-01-08 NOTE — Assessment & Plan Note (Signed)
Blood pressure on my check looks good.  Same meds.  Check metabolic panel.

## 2012-01-08 NOTE — Assessment & Plan Note (Signed)
Low carb diet, exercise and weight loss.  Check met b and a1c.   

## 2012-01-08 NOTE — Assessment & Plan Note (Signed)
Low cholesterol diet and exercise when able.  Check lipid panel with next fasting labs.   

## 2012-01-08 NOTE — Assessment & Plan Note (Signed)
S/p surgery and doing well.  Continue follow up with Dr Lovell Sheehan.

## 2012-01-10 NOTE — Progress Notes (Signed)
Called patient on cell and left message for patient to return call.

## 2012-01-11 NOTE — Progress Notes (Signed)
Called and gave lab results to patient.  

## 2012-01-12 ENCOUNTER — Ambulatory Visit: Payer: Self-pay | Admitting: Internal Medicine

## 2012-01-14 NOTE — Telephone Encounter (Signed)
Pt has cpe schedule 03/15/12.

## 2012-02-01 ENCOUNTER — Telehealth: Payer: Self-pay | Admitting: *Deleted

## 2012-02-01 MED ORDER — LISINOPRIL 10 MG PO TABS: 10.0000 mg | ORAL_TABLET | Freq: Every day | ORAL | Status: DC

## 2012-02-01 NOTE — Telephone Encounter (Signed)
Called prescription in to pharmacy 

## 2012-02-16 ENCOUNTER — Encounter: Payer: Self-pay | Admitting: Internal Medicine

## 2012-02-27 ENCOUNTER — Other Ambulatory Visit: Payer: Self-pay | Admitting: Internal Medicine

## 2012-02-27 NOTE — Telephone Encounter (Signed)
Sent in to pharmacy.  

## 2012-03-15 ENCOUNTER — Encounter: Payer: BC Managed Care – PPO | Admitting: Internal Medicine

## 2012-04-02 ENCOUNTER — Telehealth: Payer: Self-pay | Admitting: *Deleted

## 2012-04-02 NOTE — Telephone Encounter (Signed)
Refill request  Hydrochlorot 25 mg  #30  Take 1 tablet by mouth everyday

## 2012-04-04 MED ORDER — HYDROCORTISONE ACETATE 25 MG RE SUPP: 25.0000 mg | Freq: Two times a day (BID) | RECTAL | Status: DC

## 2012-04-04 NOTE — Telephone Encounter (Signed)
Prescription faxed to pharmacy.

## 2012-04-07 ENCOUNTER — Other Ambulatory Visit: Payer: Self-pay

## 2012-04-09 ENCOUNTER — Telehealth: Payer: Self-pay | Admitting: Internal Medicine

## 2012-04-09 MED ORDER — HYDROCHLOROTHIAZIDE 25 MG PO TABS: 25.0000 mg | ORAL_TABLET | Freq: Every day | ORAL | Status: DC

## 2012-04-09 NOTE — Telephone Encounter (Signed)
Spoke to patient and was advised that she would like refills sent to [facility]. Patient states that she requested the refill on her HCTZ last week from [facility] and was told that they never heard back from the office. Patient states that her Anusol was refilled and does not know why that was refilled because she seldom uses that.  Checked refills and it looks like Anusol was refilled instead of HCTZ. Refill for HCTZ sent to pharmacy electronically.

## 2012-04-09 NOTE — Telephone Encounter (Signed)
Patient requested refill on Ibuprofen 800 mg. Medication is not on med sheet. Is it okay to refill medication?

## 2012-04-09 NOTE — Telephone Encounter (Signed)
Patient is completely out of her HCTZ needing it called in today   hydrochlorothiazide (HYDRODIURIL) 25 MG tablet  Ibuprofen 800 mg

## 2012-04-09 NOTE — Telephone Encounter (Signed)
Please call pt and ask her how often takes and who initially prescribed.

## 2012-04-09 NOTE — Telephone Encounter (Signed)
Refill Request  Hydrochlorot 25 mg     Take one tablet by mouth every day

## 2012-04-17 ENCOUNTER — Other Ambulatory Visit: Payer: Self-pay | Admitting: *Deleted

## 2012-04-17 NOTE — Telephone Encounter (Signed)
Spoke with patient.  She states she never heard back on her refill requests, but, she found a bottle of ibuprofen at home and will use that until she sees Dr. Lorin Picket at her appt on 2/28.  She also says she did finally get her script for hydrochlorothiazide at [facility].

## 2012-04-17 NOTE — Telephone Encounter (Signed)
Refill denied, pt to come in for office visit on 2/28 and will discuss refill then.

## 2012-04-20 ENCOUNTER — Encounter: Payer: Self-pay | Admitting: Internal Medicine

## 2012-04-20 ENCOUNTER — Ambulatory Visit (INDEPENDENT_AMBULATORY_CARE_PROVIDER_SITE_OTHER): Payer: BC Managed Care – PPO | Admitting: Internal Medicine

## 2012-04-20 VITALS — BP 120/78 | HR 93 | Temp 99.1°F | Ht 66.0 in | Wt 243.0 lb

## 2012-04-20 DIAGNOSIS — R739 Hyperglycemia, unspecified: Secondary | ICD-10-CM

## 2012-04-20 DIAGNOSIS — M5126 Other intervertebral disc displacement, lumbar region: Secondary | ICD-10-CM

## 2012-04-20 DIAGNOSIS — I10 Essential (primary) hypertension: Secondary | ICD-10-CM

## 2012-04-20 DIAGNOSIS — R7309 Other abnormal glucose: Secondary | ICD-10-CM

## 2012-04-20 DIAGNOSIS — Z1239 Encounter for other screening for malignant neoplasm of breast: Secondary | ICD-10-CM

## 2012-04-20 DIAGNOSIS — E7800 Pure hypercholesterolemia, unspecified: Secondary | ICD-10-CM

## 2012-04-20 DIAGNOSIS — R945 Abnormal results of liver function studies: Secondary | ICD-10-CM

## 2012-04-20 DIAGNOSIS — R319 Hematuria, unspecified: Secondary | ICD-10-CM

## 2012-04-20 DIAGNOSIS — K769 Liver disease, unspecified: Secondary | ICD-10-CM

## 2012-04-20 MED ORDER — DOXYCYCLINE HYCLATE 100 MG PO TBEC: 100.0000 mg | DELAYED_RELEASE_TABLET | Freq: Two times a day (BID) | ORAL | Status: DC | PRN

## 2012-04-20 MED ORDER — LISINOPRIL 10 MG PO TABS: 10.0000 mg | ORAL_TABLET | Freq: Every day | ORAL | Status: DC

## 2012-04-20 MED ORDER — HYDROCHLOROTHIAZIDE 25 MG PO TABS: 25.0000 mg | ORAL_TABLET | Freq: Every day | ORAL | Status: DC

## 2012-04-20 MED ORDER — IBUPROFEN 800 MG PO TABS: 800.0000 mg | ORAL_TABLET | Freq: Three times a day (TID) | ORAL | Status: DC | PRN

## 2012-04-22 ENCOUNTER — Encounter: Payer: Self-pay | Admitting: Internal Medicine

## 2012-04-22 NOTE — Assessment & Plan Note (Signed)
Low carb diet, exercise and weight loss.  Follow met b and a1c.  A1c just checked 01/05/12 - 5.8.

## 2012-04-22 NOTE — Progress Notes (Signed)
Subjective:    Patient ID: Joy Houston, female    DOB: 16-Dec-1965, 47 y.o.   MRN: [id number]  HPI 47 year old female with past history of gestational diabetes, hypertension and presumed fatty liver who comes in today to follow up on these issues as well as for a complete physical exam.  She just recently had back surgery for a ruptured disc.  Had been doing well.  Now with increased pain/stiffness in her back.  She especially notices this when she has been sitting for a while and then stands.  Once she starts moving around - back seems to feel better.  No significant radiation of pain down the leg.  Has started back to taking valium prn and ibuprofen 800mg  prn.  Tolerating.  Is on doxycycline for her eye.  Has a left tender axillary nodule.  No acid reflux.  No abdominal pain or cramping.  Breathing stable.  Bowels stable.     Past Medical History  Diagnosis Date  . Complication of anesthesia   . PONV (postoperative nausea and vomiting)   . Family history of anesthesia complication     Mother - confusion  . Hypertension   . Heart murmur     Slight - "nothing to worry about"  . Hemorrhoid   . Eczema   . Fatty liver     Review of Systems Patient denies any headache, lightheadedness or dizziness.  No significant sinus or allergy symptoms.  No chest pain, tightness or palpitations.  No increased shortness of breath, cough or congestion.  No nausea or vomiting.  No acid reflux.  No abdominal pain or cramping.  No bowel change, such as diarrhea, constipation, BRBPR or melana.  No urine change.  Back pain as outlined.  Involves her lower back and buttocks.  See above.       Objective:   Physical Exam  Filed Vitals:   04/20/12 1438  BP: 120/78  Pulse: 93  Temp: 99.1 F (56.23 C)   47 year old female in no acute distress.   HEENT:  Nares- clear.  Oropharynx - without lesions. NECK:  Supple.  Nontender.  No audible bruit.  HEART:  Appears to be regular. LUNGS:  No crackles or  wheezing audible.  Respirations even and unlabored.  RADIAL PULSE:  Equal bilaterally.    BREASTS:  No nipple discharge or nipple retraction present.  Could not appreciate any distinct nodules or axillary adenopathy.  Tender cyst - left axilla.  ABDOMEN:  Soft, nontender.  Bowel sounds present and normal.  No audible abdominal bruit.  GU:  Normal external genitalia.  Vaginal vault without lesions.  Cervix identified.  Pap performed. Could not appreciate any adnexal masses or tenderness.   RECTAL:  Heme negative.   EXTREMITIES:  No increased edema present.  DP pulses palpable and equal bilaterally.             Assessment & Plan:  MSK.  S/P recent back surgery.  Following with Dr Lovell Sheehan.  Some low back discomfort and stiffness as outlined.  Doing better with valium and ibuprofen.  Has a follow up appt with Dr Lovell Sheehan next week.  Discussed possible physical therapy.  Stretching.  She request a lumbar support.  rx given.  She is to discuss with Dr Lovell Sheehan before wearing/purchasing.    HEMORRHOIDS.  Stable.  Notify me if persistent problems.    GI.  Colonoscopy 12/16/10 revealed diverticulosis, internal hemorrhoids and one 3mm polyp in the proximal sigmoid and one 5mm  polyp in the distal sigmoid.  Bowels currently stable.  Follow.    GYN.  Saw Dr Harold Hedge.  S/p Novasure ablation - 9/12.  Pelvic/pap today.   PROBABLE CYST.  Left axillary cyst.  On doxycycline.  Will give a rx to extend for one more week if needed.  Warm compresses.  Let me know if persistent problem.     HEALTH MAINTENANCE.  Physical today.  Need to obtain records from last mammogram.  Schedule when due.  Colonoscopy as outlined.

## 2012-04-22 NOTE — Assessment & Plan Note (Signed)
Saw Dr Stoioff.  Follow urinalysis.    

## 2012-04-22 NOTE — Assessment & Plan Note (Signed)
Low cholesterol diet and exercise when able.  Check lipid panel with next fasting labs.   

## 2012-04-22 NOTE — Assessment & Plan Note (Signed)
S/p surgery.  Some recent problems with increased low back discomfort.  Taking valium prn and ibuprofen prn.  Continue follow up with Dr Lovell Sheehan.  Has an appt next week.  Discussed possible therapy, etc.  Will discuss with Dr Lovell Sheehan.

## 2012-04-22 NOTE — Assessment & Plan Note (Signed)
Blood pressure on my check looks good.  Same meds.  Follow metabolic panel.

## 2012-04-22 NOTE — Assessment & Plan Note (Signed)
Presumed to be fatty liver.  Have discussed need for weight loss.  She has lost weight since last visit.  Follow liver panel

## 2012-04-23 ENCOUNTER — Other Ambulatory Visit (HOSPITAL_COMMUNITY)
Admission: RE | Admit: 2012-04-23 | Discharge: 2012-04-23 | Disposition: A | Discharge status: Home / Self Care | Payer: BC Managed Care – PPO | Source: Ambulatory Visit | Attending: Internal Medicine | Admitting: Internal Medicine

## 2012-04-23 DIAGNOSIS — Z01419 Encounter for gynecological examination (general) (routine) without abnormal findings: Secondary | ICD-10-CM | POA: Insufficient documentation

## 2012-04-23 DIAGNOSIS — Z1151 Encounter for screening for human papillomavirus (HPV): Secondary | ICD-10-CM | POA: Insufficient documentation

## 2012-04-23 NOTE — Addendum Note (Signed)
Addended by: Montine Circle D on: 04/23/2012 10:48 AM   Modules accepted: Orders

## 2012-04-25 ENCOUNTER — Telehealth: Payer: Self-pay | Admitting: Internal Medicine

## 2012-04-25 NOTE — Telephone Encounter (Signed)
Pt notified of pap via my chart

## 2012-05-01 ENCOUNTER — Telehealth: Payer: Self-pay | Admitting: Emergency Medicine

## 2012-05-11 ENCOUNTER — Encounter: Payer: Self-pay | Admitting: Internal Medicine

## 2012-08-01 ENCOUNTER — Ambulatory Visit: Payer: Self-pay | Admitting: Internal Medicine

## 2012-08-02 ENCOUNTER — Encounter: Payer: Self-pay | Admitting: Internal Medicine

## 2012-08-20 ENCOUNTER — Ambulatory Visit (INDEPENDENT_AMBULATORY_CARE_PROVIDER_SITE_OTHER): Payer: BC Managed Care – PPO | Admitting: Internal Medicine

## 2012-08-20 ENCOUNTER — Encounter: Payer: Self-pay | Admitting: Internal Medicine

## 2012-08-20 VITALS — BP 120/80 | HR 92 | Temp 98.7°F | Ht 66.0 in | Wt 248.5 lb

## 2012-08-20 DIAGNOSIS — I10 Essential (primary) hypertension: Secondary | ICD-10-CM

## 2012-08-20 DIAGNOSIS — R7309 Other abnormal glucose: Secondary | ICD-10-CM

## 2012-08-20 DIAGNOSIS — K769 Liver disease, unspecified: Secondary | ICD-10-CM

## 2012-08-20 DIAGNOSIS — M5126 Other intervertebral disc displacement, lumbar region: Secondary | ICD-10-CM

## 2012-08-20 DIAGNOSIS — R319 Hematuria, unspecified: Secondary | ICD-10-CM

## 2012-08-20 DIAGNOSIS — E7800 Pure hypercholesterolemia, unspecified: Secondary | ICD-10-CM

## 2012-08-20 DIAGNOSIS — R739 Hyperglycemia, unspecified: Secondary | ICD-10-CM

## 2012-08-20 DIAGNOSIS — R945 Abnormal results of liver function studies: Secondary | ICD-10-CM

## 2012-08-20 NOTE — Assessment & Plan Note (Signed)
Saw Dr Stoioff.  Follow urinalysis.    

## 2012-08-20 NOTE — Assessment & Plan Note (Signed)
Presumed to be fatty liver.  Have discussed need for weight loss.  Follow liver panel   

## 2012-08-20 NOTE — Progress Notes (Signed)
Subjective:    Patient ID: Joy Houston, female    DOB: Jul 25, 1965, 47 y.o.   MRN: [id number]  HPI 47 year old female with past history of gestational diabetes, hypertension and presumed fatty liver who comes in today for a scheduled follow up.  She just recently had back surgery for a ruptured disc. Has been doing well.  Some pain persist.  She is working with her Systems analyst.  Takes an occasional ibuprofen.  No acid reflux.  No abdominal pain or cramping.  Breathing stable.  Bowels stable.  Desires to lose weight.     Past Medical History  Diagnosis Date  . Complication of anesthesia   . PONV (postoperative nausea and vomiting)   . Family history of anesthesia complication     Mother - confusion  . Hypertension   . Heart murmur     Slight - "nothing to worry about"  . Hemorrhoid   . Eczema   . Fatty liver     Current Outpatient Prescriptions on File Prior to Visit  Medication Sig Dispense Refill  . diazepam (VALIUM) 5 MG tablet Take 1 tablet (5 mg total) by mouth every 6 (six) hours as needed.  50 tablet  1  . hydrochlorothiazide (HYDRODIURIL) 25 MG tablet Take 1 tablet (25 mg total) by mouth daily.  90 tablet  3  . hydrocortisone (ANUSOL-HC) 25 MG suppository Place 25 mg rectally 2 (two) times daily as needed.      Marland Kitchen ibuprofen (ADVIL,MOTRIN) 800 MG tablet Take 1 tablet (800 mg total) by mouth every 8 (eight) hours as needed for pain.  30 tablet  0  . lisinopril (PRINIVIL,ZESTRIL) 10 MG tablet Take 1 tablet (10 mg total) by mouth daily.  90 tablet  3   No current facility-administered medications on file prior to visit.    Review of Systems Patient denies any headache, lightheadedness or dizziness.  No significant sinus or allergy symptoms.  No chest pain, tightness or palpitations.  No increased shortness of breath, cough or congestion.  No nausea or vomiting.  No acid reflux.  No abdominal pain or cramping.  No bowel change, such as diarrhea, constipation, BRBPR or  melana.  No urine change.  Back pain as outlined. Takes an occasional ibuprofen.         Objective:   Physical Exam  Filed Vitals:   08/20/12 1440  BP: 120/80  Pulse: 92  Temp: 98.7 F (37.1 C)   Blood pressure recheck:  75/56  47 year old female in no acute distress.   HEENT:  Nares- clear.  Oropharynx - without lesions. NECK:  Supple.  Nontender.  No audible bruit.  HEART:  Appears to be regular. LUNGS:  No crackles or wheezing audible.  Respirations even and unlabored.  RADIAL PULSE:  Equal bilaterally.  ABDOMEN:  Soft, nontender.  Bowel sounds present and normal.  No audible abdominal bruit.    EXTREMITIES:  No increased edema present.  DP pulses palpable and equal bilaterally.             Assessment & Plan:  MSK.  S/P recent back surgery.  Following with Dr Lovell Sheehan.  Working with a Systems analyst.     HEMORRHOIDS.  Stable.  Notify me if persistent problems.    GI.  Colonoscopy 12/16/10 revealed diverticulosis, internal hemorrhoids and one 3mm polyp in the proximal sigmoid and one 5mm polyp in the distal sigmoid.  Bowels currently stable.  Follow.    GYN.  Saw Dr Harold Hedge.  S/p Novasure ablation - 9/12.    HEALTH MAINTENANCE.  Physical 04/20/12.  Colonoscopy as outlined.  Mammogram 08/01/12.

## 2012-08-20 NOTE — Assessment & Plan Note (Signed)
Low cholesterol diet and exercise when able.  Check lipid panel with next fasting labs.   

## 2012-08-20 NOTE — Assessment & Plan Note (Signed)
Blood pressure doing well.  Same meds.  Follow metabolic panel.   

## 2012-08-20 NOTE — Assessment & Plan Note (Signed)
Low carb diet, exercise and weight loss.  Follow met b and a1c.  Check a1c with next labs.   

## 2012-08-20 NOTE — Assessment & Plan Note (Signed)
S/p surgery.  Seeing Dr Lovell Sheehan.  Stable.  Continues to work with her Systems analyst. Joy Houston

## 2012-08-29 ENCOUNTER — Other Ambulatory Visit: Payer: BC Managed Care – PPO

## 2012-08-30 ENCOUNTER — Other Ambulatory Visit: Payer: Self-pay | Admitting: *Deleted

## 2012-08-31 ENCOUNTER — Other Ambulatory Visit: Payer: Self-pay | Admitting: *Deleted

## 2012-08-31 ENCOUNTER — Other Ambulatory Visit (INDEPENDENT_AMBULATORY_CARE_PROVIDER_SITE_OTHER): Payer: BC Managed Care – PPO

## 2012-08-31 DIAGNOSIS — K769 Liver disease, unspecified: Secondary | ICD-10-CM

## 2012-08-31 DIAGNOSIS — R7309 Other abnormal glucose: Secondary | ICD-10-CM

## 2012-08-31 DIAGNOSIS — R739 Hyperglycemia, unspecified: Secondary | ICD-10-CM

## 2012-08-31 DIAGNOSIS — R945 Abnormal results of liver function studies: Secondary | ICD-10-CM

## 2012-08-31 DIAGNOSIS — E7800 Pure hypercholesterolemia, unspecified: Secondary | ICD-10-CM

## 2012-08-31 DIAGNOSIS — I10 Essential (primary) hypertension: Secondary | ICD-10-CM

## 2012-08-31 LAB — LIPID PANEL
Cholesterol: 184 mg/dL (ref 0–200)
Total CHOL/HDL Ratio: 4
Triglycerides: 114 mg/dL (ref 0.0–149.0)

## 2012-08-31 LAB — HEPATIC FUNCTION PANEL
AST: 42 U/L — ABNORMAL HIGH (ref 0–37)
Albumin: 3.9 g/dL (ref 3.5–5.2)
Alkaline Phosphatase: 61 U/L (ref 39–117)
Total Protein: 7.3 g/dL (ref 6.0–8.3)

## 2012-08-31 LAB — BASIC METABOLIC PANEL
Calcium: 9.5 mg/dL (ref 8.4–10.5)
GFR: 102.91 mL/min (ref >60.00)
Sodium: 138 mEq/L (ref 135–145)

## 2012-08-31 MED ORDER — LISINOPRIL 10 MG PO TABS: 10.0000 mg | ORAL_TABLET | Freq: Every day | ORAL | Status: DC

## 2012-09-05 ENCOUNTER — Encounter: Payer: Self-pay | Admitting: Internal Medicine

## 2012-09-05 ENCOUNTER — Telehealth: Payer: Self-pay | Admitting: Internal Medicine

## 2012-09-05 DIAGNOSIS — R7989 Other specified abnormal findings of blood chemistry: Secondary | ICD-10-CM

## 2012-09-05 DIAGNOSIS — R945 Abnormal results of liver function studies: Secondary | ICD-10-CM

## 2012-09-05 NOTE — Telephone Encounter (Signed)
Pt notified of lab results via my chart and the need for a f/u non fasting lab in one month.  Please schedule her for a lab appt and call her with appt date and time.   Thanks.   Dr Lorin Picket

## 2012-09-07 NOTE — Telephone Encounter (Signed)
Sent my chart message letting pt know her appointment is 8/19

## 2012-09-19 ENCOUNTER — Encounter: Payer: Self-pay | Admitting: Internal Medicine

## 2012-09-26 ENCOUNTER — Encounter: Payer: Self-pay | Admitting: Internal Medicine

## 2012-10-09 ENCOUNTER — Other Ambulatory Visit: Payer: Self-pay | Admitting: *Deleted

## 2012-10-09 ENCOUNTER — Other Ambulatory Visit: Payer: BC Managed Care – PPO

## 2012-10-09 MED ORDER — HYDROCHLOROTHIAZIDE 25 MG PO TABS: 25.0000 mg | ORAL_TABLET | Freq: Every day | ORAL | Status: DC

## 2012-11-26 ENCOUNTER — Other Ambulatory Visit: Payer: BC Managed Care – PPO

## 2012-12-07 ENCOUNTER — Other Ambulatory Visit (INDEPENDENT_AMBULATORY_CARE_PROVIDER_SITE_OTHER): Payer: BC Managed Care – PPO

## 2012-12-07 DIAGNOSIS — R7989 Other specified abnormal findings of blood chemistry: Secondary | ICD-10-CM

## 2012-12-07 DIAGNOSIS — R945 Abnormal results of liver function studies: Secondary | ICD-10-CM

## 2012-12-07 LAB — HEPATIC FUNCTION PANEL
ALT: 67 U/L — ABNORMAL HIGH (ref 0–35)
Albumin: 4 g/dL (ref 3.5–5.2)
Alkaline Phosphatase: 61 U/L (ref 39–117)
Bilirubin, Direct: 0 mg/dL (ref 0.0–0.3)
Total Protein: 7.7 g/dL (ref 6.0–8.3)

## 2012-12-10 ENCOUNTER — Encounter: Payer: Self-pay | Admitting: Internal Medicine

## 2012-12-12 ENCOUNTER — Encounter: Payer: Self-pay | Admitting: *Deleted

## 2012-12-12 ENCOUNTER — Encounter: Payer: Self-pay | Admitting: Internal Medicine

## 2012-12-13 ENCOUNTER — Encounter: Payer: Self-pay | Admitting: Internal Medicine

## 2012-12-13 ENCOUNTER — Ambulatory Visit (INDEPENDENT_AMBULATORY_CARE_PROVIDER_SITE_OTHER): Payer: BC Managed Care – PPO | Admitting: Internal Medicine

## 2012-12-13 VITALS — BP 102/70 | HR 80 | Temp 98.9°F | Wt 241.0 lb

## 2012-12-13 DIAGNOSIS — R059 Cough, unspecified: Secondary | ICD-10-CM

## 2012-12-13 DIAGNOSIS — R05 Cough: Secondary | ICD-10-CM

## 2012-12-13 MED ORDER — ALBUTEROL SULFATE HFA 108 (90 BASE) MCG/ACT IN AERS: 2.0000 | INHALATION_SPRAY | Freq: Four times a day (QID) | RESPIRATORY_TRACT | Status: DC | PRN

## 2012-12-13 NOTE — Patient Instructions (Signed)
Robitussin DM as directed

## 2012-12-14 ENCOUNTER — Encounter: Payer: Self-pay | Admitting: Internal Medicine

## 2012-12-16 ENCOUNTER — Encounter: Payer: Self-pay | Admitting: Internal Medicine

## 2012-12-16 NOTE — Progress Notes (Signed)
Subjective:    Patient ID: Joy Houston, female    DOB: Jul 21, 1965, 47 y.o.   MRN: [id number]  Pneumonia  47 year old female with past history of gestational diabetes, hypertension and presumed fatty liver who comes in today as a work in with concerns regarding persistent cough and congestion.  States symptoms started approximately one week ago.  Felt run down.  Developed a sore throat.  Subsequently developed chills.  Temperature 103.5 - tmax.  Some headache previously.  Increased cough over the last several days.  No drainage.  Taking ibuprofen and tylenol. Left ear bothers her with the cough.  No earache.  Cough mostly non productive.  Some "purring" when she lies down.  Decreased appetite.  No vomiting.  No diarrhea.  Was evaluated at the [facility] clinic at the hospital.  Given Levaquin.  Feels better.      Past Medical History  Diagnosis Date  . Complication of anesthesia   . PONV (postoperative nausea and vomiting)   . Family history of anesthesia complication     Mother - confusion  . Hypertension   . Heart murmur     Slight - "nothing to worry about"  . Hemorrhoid   . Eczema   . Fatty liver     Current Outpatient Prescriptions on File Prior to Visit  Medication Sig Dispense Refill  . hydrochlorothiazide (HYDRODIURIL) 25 MG tablet Take 1 tablet (25 mg total) by mouth daily.  90 tablet  1  . hydrocortisone (ANUSOL-HC) 25 MG suppository Place 25 mg rectally 2 (two) times daily as needed.      Marland Kitchen ibuprofen (ADVIL,MOTRIN) 800 MG tablet Take 1 tablet (800 mg total) by mouth every 8 (eight) hours as needed for pain.  30 tablet  0  . lisinopril (PRINIVIL,ZESTRIL) 10 MG tablet Take 1 tablet (10 mg total) by mouth daily.  90 tablet  1   No current facility-administered medications on file prior to visit.    Review of Systems Patient denies any lightheadedness or dizziness.  No headache now.  No significant sinus or allergy symptoms.  No drainage.   No chest pain, tightness or  palpitations.  Fever as outlined.  Increased cough and congestion as outlined.  No nausea or vomiting.  No acid reflux.  No abdominal pain or cramping.  No bowel change, such as diarrhea.         Objective:   Physical Exam  Filed Vitals:   12/13/12 1503  BP: 102/70  Pulse: 80  Temp: 98.9 F (58.8 C)   47 year old female in no acute distress.   HEENT:  Nares- clear.  Oropharynx - without lesions.  TMs - no erythema.  NECK:  Supple.  Nontender.  No audible bruit.  HEART:  Appears to be regular. LUNGS:  No crackles or wheezing audible.  Respirations even and unlabored.  Good breath sounds bilaterally.  Some increased cough with forced expiration.  RADIAL PULSE:  Equal bilaterally.            Assessment & Plan:  URI.  I do not hear anything in the lungs.  Does have increased cough and congestion as outlined.  No fever lately.  On levaquin.  Better.  Will continue levaquin.  Previous flu swab negative.  Tolerating po's.  Have her take robitussin/mucinex as directed.  albuteral inhaler as directed.  flovent inhaler - sample given.  Use as directed.  Rest.  Fluids.  Explained to her if symptoms changed, worsened or did not resolve -  she was to be reevaluated.    GI.  Colonoscopy 12/16/10 revealed diverticulosis, internal hemorrhoids and one 3mm polyp in the proximal sigmoid and one 5mm polyp in the distal sigmoid.  Bowels currently stable.  Follow.    GYN.  Saw Dr Harold Hedge.  S/p Novasure ablation - 9/12.    HEALTH MAINTENANCE.  Physical 04/20/12.  Colonoscopy as outlined.  Mammogram 08/01/12.

## 2012-12-20 ENCOUNTER — Encounter: Payer: Self-pay | Admitting: Internal Medicine

## 2012-12-20 ENCOUNTER — Ambulatory Visit: Payer: Self-pay | Admitting: Internal Medicine

## 2012-12-20 ENCOUNTER — Ambulatory Visit (INDEPENDENT_AMBULATORY_CARE_PROVIDER_SITE_OTHER): Payer: BC Managed Care – PPO | Admitting: Internal Medicine

## 2012-12-20 VITALS — BP 110/70 | HR 109 | Temp 98.3°F | Ht 66.0 in | Wt 241.2 lb

## 2012-12-20 DIAGNOSIS — R0602 Shortness of breath: Secondary | ICD-10-CM

## 2012-12-20 DIAGNOSIS — I10 Essential (primary) hypertension: Secondary | ICD-10-CM

## 2012-12-20 DIAGNOSIS — J189 Pneumonia, unspecified organism: Secondary | ICD-10-CM

## 2012-12-20 MED ORDER — PREDNISONE 10 MG PO TABS: ORAL_TABLET | ORAL | Status: DC

## 2012-12-20 MED ORDER — LEVOFLOXACIN 500 MG PO TABS: 500.0000 mg | ORAL_TABLET | Freq: Every day | ORAL | Status: DC

## 2012-12-20 MED ORDER — ALBUTEROL SULFATE (2.5 MG/3ML) 0.083% IN NEBU: 2.5000 mg | INHALATION_SOLUTION | Freq: Once | RESPIRATORY_TRACT | Status: DC

## 2012-12-21 ENCOUNTER — Telehealth: Payer: Self-pay | Admitting: *Deleted

## 2012-12-21 ENCOUNTER — Encounter: Payer: Self-pay | Admitting: Internal Medicine

## 2012-12-21 NOTE — Telephone Encounter (Signed)
Chest X-ray Findings: Increased density is appreciated in the region of the lingula and to a much lesser extent the left lower lobe. This finding has developed in the interim. The cardia silhouette is within normal limits. There is mild prominence of the interstitial markings. An ill-defined area of vague density projects within the right upper lobe. Mild dextroscoliosis is identified within the thoracic spine.  Impression: 1.) Infiltrate versus atelectasis within the lingula       2.) Atelectasis versus infiltrate, right upper lobe. Surveillance evaluation is recommended.  (Original report in your folder)

## 2012-12-22 ENCOUNTER — Encounter: Payer: Self-pay | Admitting: Internal Medicine

## 2012-12-22 ENCOUNTER — Telehealth: Payer: Self-pay | Admitting: Internal Medicine

## 2012-12-22 NOTE — Telephone Encounter (Signed)
Called in aerochamber for pt.  Continue levaquin as she is doing.  Needs to use the inhalers as directed.  Will call with update Monday or Tuesday of this coming week.

## 2012-12-22 NOTE — Progress Notes (Signed)
Subjective:    Patient ID: Joy Houston, female    DOB: 07/10/1965, 47 y.o.   MRN: [id number]  Pneumonia  47 year old female with past history of gestational diabetes, hypertension and presumed fatty liver who comes in today for a scheduled follow up.  Was recently evaluated and diagnosed with a respiratory infection.  Is on Levaquin.  Taking regularly.  Finished her last pill today.  Is not using her inhaler.  States she feels better.  Still with increase cough and congestion, but overall better.  No fever.  Eating and drinking.  No nausea or vomiting.  States she did notice some blood in her underpants today.  Not sure if it is vaginal bleeding or hemorrhoidal bleeding.  No abdominal pain or cramping.       Past Medical History  Diagnosis Date  . Complication of anesthesia   . PONV (postoperative nausea and vomiting)   . Family history of anesthesia complication     Mother - confusion  . Hypertension   . Heart murmur     Slight - "nothing to worry about"  . Hemorrhoid   . Eczema   . Fatty liver     Current Outpatient Prescriptions on File Prior to Visit  Medication Sig Dispense Refill  . acetaminophen (TYLENOL) 325 MG tablet Take 650 mg by mouth every 6 (six) hours as needed for pain.      Marland Kitchen albuterol (PROVENTIL HFA;VENTOLIN HFA) 108 (90 BASE) MCG/ACT inhaler Inhale 2 puffs into the lungs every 6 (six) hours as needed for wheezing.  1 Inhaler  0  . hydrochlorothiazide (HYDRODIURIL) 25 MG tablet Take 1 tablet (25 mg total) by mouth daily.  90 tablet  1  . hydrocortisone (ANUSOL-HC) 25 MG suppository Place 25 mg rectally 2 (two) times daily as needed.      Marland Kitchen ibuprofen (ADVIL,MOTRIN) 800 MG tablet Take 1 tablet (800 mg total) by mouth every 8 (eight) hours as needed for pain.  30 tablet  0  . lisinopril (PRINIVIL,ZESTRIL) 10 MG tablet Take 1 tablet (10 mg total) by mouth daily.  90 tablet  1   No current facility-administered medications on file prior to visit.    Review of  Systems Patient denies any lightheadedness or dizziness.  No headache now.  No significant sinus or allergy symptoms.  No drainage.   No chest pain, tightness or palpitations.  No fever.  Increased cough and congestion as outlined.  No nausea or vomiting.  No acid reflux.  No abdominal pain or cramping.  No bowel change, such as diarrhea.  Does report the bleeding as outlined.  Overall states she feels better.          Objective:   Physical Exam  Filed Vitals:   12/20/12 1558  BP: 110/70  Pulse: 109  Temp:    47 year old female in no acute distress.   HEENT:  Nares- clear.  Oropharynx - without lesions.  TMs - no erythema.  NECK:  Supple.  Nontender.  No audible bruit.  HEART:  Appears to be regular. LUNGS:  Increased wheezing - diffuse.  Increased cough with forced expiration.  No crackles.    RADIAL PULSE:  Equal bilaterally.     ABDOMEN:  Soft.  Non tender.  Bowels sounds present and normal. GU:  Normal external genitalia.  Vaginal vault without lesions or blood.  Cervix with no lesions.    RECTAL:  Small hemorrhoid present.  Heme positive.  Assessment & Plan:  GI.  Colonoscopy 12/16/10 revealed diverticulosis, internal hemorrhoids and one 3mm polyp in the proximal sigmoid and one 5mm polyp in the distal sigmoid.  Bowels currently stable.  Has been straining a little more lately.  With the hemorrhoid as outlined.  Treat.  Follow.  Notify me if persistent problems.    GYN.  Saw Dr Harold Hedge.  S/p Novasure ablation - 9/12.  No bleeding since.  Exam as outlined.    HEALTH MAINTENANCE.  Physical 04/20/12.  Colonoscopy as outlined.  Mammogram 08/01/12.   I spent 30 minutes with the patient and more than 50% of the time was spent in consultation regarding the above.

## 2012-12-22 NOTE — Telephone Encounter (Signed)
Reviewed pts cxr and spoke to pt.  See other phone message.

## 2012-12-22 NOTE — Assessment & Plan Note (Signed)
Blood pressure doing well.  Same meds.  Follow metabolic panel.   

## 2012-12-22 NOTE — Telephone Encounter (Signed)
Pt notified of cxr results.  Was feeling good yesterday.  Went to a party.  Stayed up late.  Not feeling good today. Notices some change in her breathing.  Still with increased cough.  Not using her inhalers as directed.  Has only used once today.  Will continue the levaquin as she is doing.  Use the inhalers as directed.  Call with update within the next couple of days.  Incentive spirometer called in to [facility] [address]  (her pharmacy was closed).   Any change or worsening symptoms - she is to be evaluated.

## 2012-12-22 NOTE — Assessment & Plan Note (Signed)
On levaquin.  Will extend out for one more week.  Overall she feels better.  Lung exam as outlined.  Was given an albuterol neb here in the office.  Some increased air movement s/p neb treatment.  Pulse ox post neb 95-96%.  Will treat with prednisone taper starting at 60mg  and decreasing by 5mg  each day until off.  Use the albuterol inhaler and flovent inhaler scheduled as directed.  Continue mucinex and robitussin DM as directed.  Follow closely.  Check cxr.

## 2012-12-23 ENCOUNTER — Encounter: Payer: Self-pay | Admitting: Internal Medicine

## 2012-12-28 ENCOUNTER — Encounter: Payer: Self-pay | Admitting: Internal Medicine

## 2012-12-28 MED ORDER — AZITHROMYCIN 250 MG PO TABS: ORAL_TABLET | ORAL | Status: DC

## 2012-12-28 NOTE — Telephone Encounter (Signed)
rx sent in for zpak.  Not on levaquin.

## 2013-01-04 ENCOUNTER — Ambulatory Visit: Payer: BC Managed Care – PPO | Admitting: Internal Medicine

## 2013-01-09 ENCOUNTER — Telehealth: Payer: Self-pay | Admitting: Internal Medicine

## 2013-01-09 ENCOUNTER — Encounter: Payer: Self-pay | Admitting: Internal Medicine

## 2013-01-09 DIAGNOSIS — R7989 Other specified abnormal findings of blood chemistry: Secondary | ICD-10-CM

## 2013-01-09 DIAGNOSIS — E7800 Pure hypercholesterolemia, unspecified: Secondary | ICD-10-CM

## 2013-01-09 DIAGNOSIS — R739 Hyperglycemia, unspecified: Secondary | ICD-10-CM

## 2013-01-09 DIAGNOSIS — I10 Essential (primary) hypertension: Secondary | ICD-10-CM

## 2013-01-09 DIAGNOSIS — R945 Abnormal results of liver function studies: Secondary | ICD-10-CM

## 2013-01-09 NOTE — Telephone Encounter (Signed)
Pt is scheduled in January for a f/u but she was wondering if she needed to go and do the follow up x-ray before she sees you so the results could be back in by January ???

## 2013-01-09 NOTE — Telephone Encounter (Signed)
See her my chart message.  I notified her regarding the question she had about a f/u appt and labs.  Please mail the rx for her cxr.  She has the address in the attached my chart message.  Thanks.

## 2013-01-09 NOTE — Telephone Encounter (Signed)
Responded to pt via my chart.  Will let me know about order.

## 2013-01-15 ENCOUNTER — Ambulatory Visit: Payer: Self-pay | Admitting: Internal Medicine

## 2013-01-18 ENCOUNTER — Telehealth: Payer: Self-pay | Admitting: Internal Medicine

## 2013-01-18 NOTE — Telephone Encounter (Signed)
Pt notified cxr cleared via my chart.

## 2013-01-21 ENCOUNTER — Encounter: Payer: Self-pay | Admitting: Internal Medicine

## 2013-02-07 ENCOUNTER — Encounter: Payer: Self-pay | Admitting: Internal Medicine

## 2013-02-26 ENCOUNTER — Other Ambulatory Visit: Payer: BC Managed Care – PPO

## 2013-02-28 ENCOUNTER — Encounter: Payer: Self-pay | Admitting: Internal Medicine

## 2013-02-28 ENCOUNTER — Ambulatory Visit (INDEPENDENT_AMBULATORY_CARE_PROVIDER_SITE_OTHER): Payer: BC Managed Care – PPO | Admitting: Internal Medicine

## 2013-02-28 VITALS — BP 120/80 | HR 89 | Temp 98.9°F | Ht 66.0 in | Wt 253.5 lb

## 2013-02-28 DIAGNOSIS — M5126 Other intervertebral disc displacement, lumbar region: Secondary | ICD-10-CM

## 2013-02-28 DIAGNOSIS — R319 Hematuria, unspecified: Secondary | ICD-10-CM

## 2013-02-28 DIAGNOSIS — R7309 Other abnormal glucose: Secondary | ICD-10-CM

## 2013-02-28 DIAGNOSIS — K769 Liver disease, unspecified: Secondary | ICD-10-CM

## 2013-02-28 DIAGNOSIS — E7800 Pure hypercholesterolemia, unspecified: Secondary | ICD-10-CM

## 2013-02-28 DIAGNOSIS — R945 Abnormal results of liver function studies: Secondary | ICD-10-CM

## 2013-02-28 DIAGNOSIS — R739 Hyperglycemia, unspecified: Secondary | ICD-10-CM

## 2013-02-28 DIAGNOSIS — J189 Pneumonia, unspecified organism: Secondary | ICD-10-CM

## 2013-02-28 DIAGNOSIS — I10 Essential (primary) hypertension: Secondary | ICD-10-CM

## 2013-02-28 NOTE — Progress Notes (Signed)
Pre-visit discussion using our clinic review tool. No additional management support is needed unless otherwise documented below in the visit note.  

## 2013-03-01 ENCOUNTER — Other Ambulatory Visit: Payer: Self-pay | Admitting: Internal Medicine

## 2013-03-01 ENCOUNTER — Other Ambulatory Visit (INDEPENDENT_AMBULATORY_CARE_PROVIDER_SITE_OTHER): Payer: BC Managed Care – PPO

## 2013-03-01 DIAGNOSIS — R7309 Other abnormal glucose: Secondary | ICD-10-CM

## 2013-03-01 DIAGNOSIS — I10 Essential (primary) hypertension: Secondary | ICD-10-CM

## 2013-03-01 DIAGNOSIS — R739 Hyperglycemia, unspecified: Secondary | ICD-10-CM

## 2013-03-01 DIAGNOSIS — E7800 Pure hypercholesterolemia, unspecified: Secondary | ICD-10-CM

## 2013-03-01 LAB — CBC WITH DIFFERENTIAL/PLATELET
BASOS ABS: 0 10*3/uL (ref 0.0–0.1)
Basophils Relative: 0.4 % (ref 0.0–3.0)
EOS ABS: 0.1 10*3/uL (ref 0.0–0.7)
Eosinophils Relative: 1.1 % (ref 0.0–5.0)
HCT: 43.2 % (ref 36.0–46.0)
Hemoglobin: 14.8 g/dL (ref 12.0–15.0)
LYMPHS PCT: 26.9 % (ref 12.0–46.0)
Lymphs Abs: 2.8 10*3/uL (ref 0.7–4.0)
MCHC: 34.4 g/dL (ref 30.0–36.0)
MCV: 90.3 fl (ref 78.0–100.0)
MONO ABS: 0.7 10*3/uL (ref 0.1–1.0)
Monocytes Relative: 6.4 % (ref 3.0–12.0)
NEUTROS PCT: 65.2 % (ref 43.0–77.0)
Neutro Abs: 6.7 10*3/uL (ref 1.4–7.7)
Platelets: 294 10*3/uL (ref 150.0–400.0)
RBC: 4.78 Mil/uL (ref 3.87–5.11)
RDW: 13.7 % (ref 11.5–14.6)
WBC: 10.4 10*3/uL (ref 4.5–10.5)

## 2013-03-01 LAB — BASIC METABOLIC PANEL
BUN: 13 mg/dL (ref 6–23)
CALCIUM: 9.8 mg/dL (ref 8.4–10.5)
CO2: 26 meq/L (ref 19–32)
Chloride: 104 mEq/L (ref 96–112)
Creatinine, Ser: 0.6 mg/dL (ref 0.4–1.2)
GFR: 106.39 mL/min (ref >60.00)
Glucose, Bld: 87 mg/dL (ref 70–99)
Potassium: 4.3 mEq/L (ref 3.5–5.1)
Sodium: 138 mEq/L (ref 135–145)

## 2013-03-01 LAB — LDL CHOLESTEROL, DIRECT: Direct LDL: 145.4 mg/dL

## 2013-03-01 LAB — LIPID PANEL
CHOL/HDL RATIO: 4
Cholesterol: 217 mg/dL — ABNORMAL HIGH (ref 0–200)
HDL: 58 mg/dL (ref >39.00)
TRIGLYCERIDES: 85 mg/dL (ref 0.0–149.0)
VLDL: 17 mg/dL (ref 0.0–40.0)

## 2013-03-01 LAB — HEPATIC FUNCTION PANEL
ALK PHOS: 60 U/L (ref 39–117)
ALT: 64 U/L — AB (ref 0–35)
AST: 49 U/L — AB (ref 0–37)
Albumin: 4.3 g/dL (ref 3.5–5.2)
Bilirubin, Direct: 0.1 mg/dL (ref 0.0–0.3)
Total Bilirubin: 0.7 mg/dL (ref 0.3–1.2)
Total Protein: 7.5 g/dL (ref 6.0–8.3)

## 2013-03-01 LAB — TSH: TSH: 3.06 u[IU]/mL (ref 0.35–5.50)

## 2013-03-01 LAB — MICROALBUMIN / CREATININE URINE RATIO
Creatinine,U: 120.1 mg/dL
MICROALB/CREAT RATIO: 0.7 mg/g (ref 0.0–30.0)
Microalb, Ur: 0.8 mg/dL (ref 0.0–1.9)

## 2013-03-01 LAB — HEMOGLOBIN A1C: HEMOGLOBIN A1C: 5.9 % (ref 4.6–6.5)

## 2013-03-02 ENCOUNTER — Encounter: Payer: Self-pay | Admitting: Internal Medicine

## 2013-03-03 ENCOUNTER — Encounter: Payer: Self-pay | Admitting: Internal Medicine

## 2013-03-03 ENCOUNTER — Telehealth: Payer: Self-pay | Admitting: Internal Medicine

## 2013-03-03 NOTE — Telephone Encounter (Signed)
Please schedule a physical in 3 months.

## 2013-03-03 NOTE — Assessment & Plan Note (Signed)
Presumed to be fatty liver.  Have discussed need for weight loss.  Follow liver panel   

## 2013-03-03 NOTE — Progress Notes (Signed)
  Subjective:    Patient ID: Joy Houston, female    DOB: 09-30-1965, 48 y.o.   MRN: [id number]  HPI 48 year old female with past history of gestational diabetes, hypertension and presumed fatty liver who comes in today for a scheduled follow up.  Is s/p back surgery for a ruptured disc. Has been doing well.  No acid reflux.  No abdominal pain or cramping.  Breathing stable.  Bowels stable.  Desires to lose weight.  Discussed at length with her today.  Overall she is doing well.    Past Medical History  Diagnosis Date  . Complication of anesthesia   . PONV (postoperative nausea and vomiting)   . Family history of anesthesia complication     Mother - confusion  . Hypertension   . Heart murmur     Slight - "nothing to worry about"  . Hemorrhoid   . Eczema   . Fatty liver     Current Outpatient Prescriptions on File Prior to Visit  Medication Sig Dispense Refill  . hydrochlorothiazide (HYDRODIURIL) 25 MG tablet Take 1 tablet (25 mg total) by mouth daily.  90 tablet  1  . hydrocortisone (ANUSOL-HC) 25 MG suppository Place 25 mg rectally 2 (two) times daily as needed.      Marland Kitchen ibuprofen (ADVIL,MOTRIN) 800 MG tablet Take 1 tablet (800 mg total) by mouth every 8 (eight) hours as needed for pain.  30 tablet  0   Current Facility-Administered Medications on File Prior to Visit  Medication Dose Route Frequency Provider Last Rate Last Dose  . albuterol (PROVENTIL) (2.5 MG/3ML) 0.083% nebulizer solution 2.5 mg  2.5 mg Nebulization Once Alisa Graff, MD        Review of Systems Patient denies any headache, lightheadedness or dizziness.  No significant sinus or allergy symptoms.  No chest pain, tightness or palpitations.  No increased shortness of breath, cough or congestion.  No nausea or vomiting.  No acid reflux.  No abdominal pain or cramping.  No bowel change, such as diarrhea, constipation, BRBPR or melana.  No urine change.  Back stable.          Objective:   Physical Exam  Filed  Vitals:   02/28/13 1605  BP: 120/80  Pulse: 89  Temp: 98.9 F (37.2 C)   Blood pressure recheck:  8/31  48 year old female in no acute distress.   HEENT:  Nares- clear.  Oropharynx - without lesions. NECK:  Supple.  Nontender.  No audible bruit.  HEART:  Appears to be regular. LUNGS:  No crackles or wheezing audible.  Respirations even and unlabored.  RADIAL PULSE:  Equal bilaterally.  ABDOMEN:  Soft, nontender.  Bowel sounds present and normal.  No audible abdominal bruit.    EXTREMITIES:  No increased edema present.  DP pulses palpable and equal bilaterally.             Assessment & Plan:  MSK.  S/P recent back surgery.  Following with Dr Arnoldo Morale.  Stable.      HEMORRHOIDS.  Stable.  Notify me if persistent problems.    GI.  Colonoscopy 12/16/10 revealed diverticulosis, internal hemorrhoids and one 43mm polyp in the proximal sigmoid and one 4mm polyp in the distal sigmoid.  Bowels currently stable.  Follow.    GYN.  Saw Dr Rayford Halsted.  S/p Novasure ablation - 9/12.    HEALTH MAINTENANCE.  Physical 04/20/12.  Colonoscopy as outlined.  Mammogram 08/01/12.

## 2013-03-03 NOTE — Assessment & Plan Note (Signed)
Blood pressure doing well.  Same meds.  Follow metabolic panel.   

## 2013-03-03 NOTE — Assessment & Plan Note (Signed)
S/p surgery.  Seeing Dr Jenkins.  Stable.   

## 2013-03-03 NOTE — Assessment & Plan Note (Signed)
Saw Dr Bernardo Heater.  Follow urinalysis.

## 2013-03-03 NOTE — Assessment & Plan Note (Signed)
Low cholesterol diet and exercise when able.  Check lipid panel with next fasting labs.   

## 2013-03-03 NOTE — Assessment & Plan Note (Signed)
Doing well.  Follow up cxr - clear.    

## 2013-03-03 NOTE — Assessment & Plan Note (Signed)
Low carb diet, exercise and weight loss.  Follow met b and a1c.  Check a1c with next labs.

## 2013-03-04 NOTE — Telephone Encounter (Signed)
cpx already made   Appointment 04/22/13

## 2013-04-14 ENCOUNTER — Other Ambulatory Visit: Payer: Self-pay | Admitting: Internal Medicine

## 2013-04-22 ENCOUNTER — Ambulatory Visit (INDEPENDENT_AMBULATORY_CARE_PROVIDER_SITE_OTHER): Payer: BC Managed Care – PPO | Admitting: Internal Medicine

## 2013-04-22 ENCOUNTER — Encounter: Payer: Self-pay | Admitting: Internal Medicine

## 2013-04-22 VITALS — BP 110/78 | HR 92 | Temp 98.5°F | Ht 66.0 in | Wt 248.8 lb

## 2013-04-22 DIAGNOSIS — R7309 Other abnormal glucose: Secondary | ICD-10-CM

## 2013-04-22 DIAGNOSIS — E7800 Pure hypercholesterolemia, unspecified: Secondary | ICD-10-CM

## 2013-04-22 DIAGNOSIS — R0602 Shortness of breath: Secondary | ICD-10-CM

## 2013-04-22 DIAGNOSIS — J189 Pneumonia, unspecified organism: Secondary | ICD-10-CM

## 2013-04-22 DIAGNOSIS — M5126 Other intervertebral disc displacement, lumbar region: Secondary | ICD-10-CM

## 2013-04-22 DIAGNOSIS — I10 Essential (primary) hypertension: Secondary | ICD-10-CM

## 2013-04-22 DIAGNOSIS — R739 Hyperglycemia, unspecified: Secondary | ICD-10-CM

## 2013-04-22 DIAGNOSIS — R945 Abnormal results of liver function studies: Secondary | ICD-10-CM

## 2013-04-22 DIAGNOSIS — K769 Liver disease, unspecified: Secondary | ICD-10-CM

## 2013-04-22 DIAGNOSIS — R319 Hematuria, unspecified: Secondary | ICD-10-CM

## 2013-04-22 NOTE — Progress Notes (Signed)
Subjective:    Patient ID: Joy Houston, female    DOB: 1965/03/30, 48 y.o.   MRN: [id number]  HPI 48 year old female with past history of gestational diabetes, hypertension and presumed fatty liver who comes in today to follow up on these issues as well as for a complete physical exam.   Is s/p back surgery for a ruptured disc.  Has been doing well.  No acid reflux.  No abdominal pain or cramping.  Breathing stable.   Desires to lose weight.  Have discussed at length with her.  She reports that when she eats she feels bloated.  Bowel movements are normal.  Has previously been on abx for pneumonia and for her eye.  Off now.     Past Medical History  Diagnosis Date  . Complication of anesthesia   . PONV (postoperative nausea and vomiting)   . Family history of anesthesia complication     Mother - confusion  . Hypertension   . Heart murmur     Slight - "nothing to worry about"  . Hemorrhoid   . Eczema   . Fatty liver     Current Outpatient Prescriptions on File Prior to Visit  Medication Sig Dispense Refill  . hydrochlorothiazide (HYDRODIURIL) 25 MG tablet TAKE 1 TABLET (25 MG TOTAL) BY MOUTH DAILY.  90 tablet  1  . ibuprofen (ADVIL,MOTRIN) 800 MG tablet Take 1 tablet (800 mg total) by mouth every 8 (eight) hours as needed for pain.  30 tablet  0  . lisinopril (PRINIVIL,ZESTRIL) 10 MG tablet TAKE 1 TABLET BY MOUTH EVERY DAY  90 tablet  1  . doxycycline (DORYX) 100 MG DR capsule Take 100 mg by mouth daily.      . hydrocortisone (ANUSOL-HC) 25 MG suppository Place 25 mg rectally 2 (two) times daily as needed.       Current Facility-Administered Medications on File Prior to Visit  Medication Dose Route Frequency Provider Last Rate Last Dose  . albuterol (PROVENTIL) (2.5 MG/3ML) 0.083% nebulizer solution 2.5 mg  2.5 mg Nebulization Once Alisa Graff, MD        Review of Systems Patient denies any headache, lightheadedness or dizziness.  No significant sinus or allergy symptoms.   No chest pain, tightness or palpitations.  No cough or congestion.  Does report some occasional sob - or feeling she needs to take a deep breath.  States notices more with the bloating.  No sob with increased activity or exertion.  No nausea or vomiting.  No acid reflux. No abdominal pain or cramping.  Does report feeling bloated after eating.  Started essential oils.  States feels better.  No bowel change, such as diarrhea, constipation, BRBPR or melana.  States her bowels are stable.  No urine change.  Back stable.          Objective:   Physical Exam  Filed Vitals:   04/22/13 1540  BP: 110/78  Pulse: 92  Temp: 98.5 F (36.9 C)   Blood pressure recheck:  59/57  48 year old female in no acute distress.   HEENT:  Nares- clear.  Oropharynx - without lesions. NECK:  Supple.  Nontender.  No audible bruit.  HEART:  Appears to be regular. LUNGS:  No crackles or wheezing audible.  Respirations even and unlabored.  RADIAL PULSE:  Equal bilaterally.    BREASTS:  No nipple discharge or nipple retraction present.  Could not appreciate any distinct nodules or axillary adenopathy.  ABDOMEN:  Soft, nontender.  Bowel sounds present and normal.  No audible abdominal bruit.  GU:  Not performed.    EXTREMITIES:  No increased edema present.  DP pulses palpable and equal bilaterally.           Assessment & Plan:  MSK.  S/P recent back surgery.  Following with Dr Arnoldo Morale.  Stable.      HEMORRHOIDS.  Stable.  Notify me if persistent problems.    GI.  Colonoscopy 12/16/10 revealed diverticulosis, internal hemorrhoids and one 41mm polyp in the proximal sigmoid and one 65mm polyp in the distal sigmoid.  Bowels currently stable.  Bloating - as outlined.  Start align.  Monitor for triggers.  Follow closely.  If persistent symptoms or problems, will need reevaluation.     GYN.  Saw Dr Rayford Halsted.  S/p Novasure ablation - 9/12.    HEALTH MAINTENANCE.  Physical today.  Colonoscopy as outlined.  Mammogram  08/01/12.

## 2013-04-27 ENCOUNTER — Telehealth: Payer: Self-pay | Admitting: Internal Medicine

## 2013-04-27 ENCOUNTER — Encounter: Payer: Self-pay | Admitting: Internal Medicine

## 2013-04-27 NOTE — Assessment & Plan Note (Signed)
Presumed to be fatty liver.  Have discussed need for weight loss.  Follow liver panel

## 2013-04-27 NOTE — Assessment & Plan Note (Signed)
Low carb diet, exercise and weight loss.  Follow met b and a1c.  Check a1c with next labs.

## 2013-04-27 NOTE — Assessment & Plan Note (Signed)
Doing well.  Follow up cxr - clear.

## 2013-04-27 NOTE — Assessment & Plan Note (Signed)
Blood pressure doing well.  Same meds.  Follow metabolic panel.   

## 2013-04-27 NOTE — Assessment & Plan Note (Signed)
S/p surgery.  Seeing Dr Jenkins.  Stable.   

## 2013-04-27 NOTE — Telephone Encounter (Signed)
Pt needs a f/u appt in 6 weeks (89min) or end of 1/2 day.  Needs after 3:30.   Thanks.

## 2013-04-27 NOTE — Assessment & Plan Note (Signed)
Low cholesterol diet and exercise when able.  Check lipid panel with next fasting labs.

## 2013-04-27 NOTE — Assessment & Plan Note (Signed)
Saw Dr Stoioff.  Follow urinalysis.    

## 2013-04-29 ENCOUNTER — Encounter: Payer: Self-pay | Admitting: Internal Medicine

## 2013-04-29 NOTE — Telephone Encounter (Signed)
Made appointment for 06/20/13 @ 3 Sent my chart message letting pt know about appointment date and time

## 2013-06-17 ENCOUNTER — Telehealth: Payer: Self-pay | Admitting: Internal Medicine

## 2013-06-17 NOTE — Telephone Encounter (Signed)
The patient cancelled her appointment on 4.30.15, because she has to go out of town. I offered her an appointment on 5.22.15 and she stated I should talk to Dr. Nicki Reaper about this appointment because she did not think she would want her waiting that long. Please advise.

## 2013-06-17 NOTE — Telephone Encounter (Signed)
Pt will be here 4/28 @ 2:15

## 2013-06-17 NOTE — Telephone Encounter (Signed)
Noted  

## 2013-06-17 NOTE — Telephone Encounter (Signed)
Please advise 

## 2013-06-18 ENCOUNTER — Encounter: Payer: Self-pay | Admitting: Internal Medicine

## 2013-06-18 ENCOUNTER — Ambulatory Visit (INDEPENDENT_AMBULATORY_CARE_PROVIDER_SITE_OTHER): Payer: BC Managed Care – PPO | Admitting: Internal Medicine

## 2013-06-18 VITALS — BP 130/80 | HR 96 | Temp 98.5°F | Ht 66.0 in | Wt 252.8 lb

## 2013-06-18 DIAGNOSIS — K769 Liver disease, unspecified: Secondary | ICD-10-CM

## 2013-06-18 DIAGNOSIS — R319 Hematuria, unspecified: Secondary | ICD-10-CM

## 2013-06-18 DIAGNOSIS — R945 Abnormal results of liver function studies: Secondary | ICD-10-CM

## 2013-06-18 DIAGNOSIS — E7800 Pure hypercholesterolemia, unspecified: Secondary | ICD-10-CM

## 2013-06-18 DIAGNOSIS — J189 Pneumonia, unspecified organism: Secondary | ICD-10-CM

## 2013-06-18 DIAGNOSIS — I10 Essential (primary) hypertension: Secondary | ICD-10-CM

## 2013-06-18 DIAGNOSIS — M5126 Other intervertebral disc displacement, lumbar region: Secondary | ICD-10-CM

## 2013-06-18 DIAGNOSIS — R7309 Other abnormal glucose: Secondary | ICD-10-CM

## 2013-06-18 DIAGNOSIS — R739 Hyperglycemia, unspecified: Secondary | ICD-10-CM

## 2013-06-20 ENCOUNTER — Ambulatory Visit: Payer: BC Managed Care – PPO | Admitting: Internal Medicine

## 2013-06-23 ENCOUNTER — Encounter: Payer: Self-pay | Admitting: Internal Medicine

## 2013-06-23 NOTE — Assessment & Plan Note (Signed)
Low carb diet, exercise and weight loss.  Follow met b and a1c.  Check a1c with next labs.

## 2013-06-23 NOTE — Assessment & Plan Note (Signed)
Saw Dr Bernardo Heater.  Follow urinalysis.

## 2013-06-23 NOTE — Assessment & Plan Note (Signed)
Doing well.  Follow up cxr - clear.

## 2013-06-23 NOTE — Assessment & Plan Note (Signed)
Presumed to be fatty liver.  Have discussed need for weight loss.  Follow liver panel

## 2013-06-23 NOTE — Assessment & Plan Note (Signed)
Low cholesterol diet and exercise when able.  Check lipid panel with next fasting labs.

## 2013-06-23 NOTE — Assessment & Plan Note (Signed)
Blood pressure doing well.  Same meds.  Follow metabolic panel.   

## 2013-06-23 NOTE — Assessment & Plan Note (Signed)
S/p surgery.  Seeing Dr Jenkins.  Stable.   

## 2013-06-23 NOTE — Progress Notes (Signed)
Subjective:    Patient ID: Joy Houston, female    DOB: 1965/12/23, 48 y.o.   MRN: [id number]  HPI 48 year old female with past history of gestational diabetes, hypertension and presumed fatty liver who comes in today for a scheduled follow up.  Is s/p back surgery for a ruptured disc.  Has been doing well.  No acid reflux.  No significant abdominal pain or cramping.  Does still notice some increased gas, but overall feels may be getting better.  Plans to adjust her diet more.  Thinks this will help.  Breathing stable.   Desires to lose weight.  Have discussed at length with her.  Bowel movements are normal.  Has started working with a Physiological scientist.  Is going to do more exercise.  Request rx for massage therapy.     Past Medical History  Diagnosis Date  . Complication of anesthesia   . PONV (postoperative nausea and vomiting)   . Family history of anesthesia complication     Mother - confusion  . Hypertension   . Heart murmur     Slight - "nothing to worry about"  . Hemorrhoid   . Eczema   . Fatty liver     Current Outpatient Prescriptions on File Prior to Visit  Medication Sig Dispense Refill  . hydrochlorothiazide (HYDRODIURIL) 25 MG tablet TAKE 1 TABLET (25 MG TOTAL) BY MOUTH DAILY.  90 tablet  1  . lisinopril (PRINIVIL,ZESTRIL) 10 MG tablet TAKE 1 TABLET BY MOUTH EVERY DAY  90 tablet  1  . hydrocortisone (ANUSOL-HC) 25 MG suppository Place 25 mg rectally 2 (two) times daily as needed.      Marland Kitchen ibuprofen (ADVIL,MOTRIN) 800 MG tablet Take 1 tablet (800 mg total) by mouth every 8 (eight) hours as needed for pain.  30 tablet  0   No current facility-administered medications on file prior to visit.    Review of Systems Patient denies any headache, lightheadedness or dizziness.  No significant sinus or allergy symptoms.  No chest pain, tightness or palpitations.  No cough or congestion.  No sob.   No sob with increased activity or exertion.  No nausea or vomiting.  No acid reflux.  No abdominal pain or cramping.  Does report feeling bloated after eating.  Started essential oils.  States feels better.  No bowel change, such as diarrhea, constipation, BRBPR or melana.  States her bowels are stable.  No urine change.  Back stable.  Starting to exercise more.          Objective:   Physical Exam  Filed Vitals:   06/18/13 1433  BP: 130/80  Pulse: 96  Temp: 98.5 F (36.9 C)   Blood pressure recheck:  124/80, pulse 81  48 year old female in no acute distress.   HEENT:  Nares- clear.  Oropharynx - without lesions. NECK:  Supple.  Nontender.  No audible bruit.  HEART:  Appears to be regular. LUNGS:  No crackles or wheezing audible.  Respirations even and unlabored.  RADIAL PULSE:  Equal bilaterally.    ABDOMEN:  Soft, nontender.  Bowel sounds present and normal.  No audible abdominal bruit.   EXTREMITIES:  No increased edema present.  DP pulses palpable and equal bilaterally.           Assessment & Plan:  MSK.  S/P back surgery.  Following with Joy Houston.  Stable.      HEMORRHOIDS.  Stable.  Notify me if persistent problems.    GI.  Colonoscopy 12/16/10 revealed diverticulosis, internal hemorrhoids and one 15mm polyp in the proximal sigmoid and one 48mm polyp in the distal sigmoid.  Bowels currently stable.  Bloating - as outlined.  Is better.  Monitor for triggers.  Follow closely.  If persistent symptoms or problems, will need reevaluation.  She wants to continue to modify her diet.  Feels this will help.     GYN.  Saw Joy Houston.  S/p Novasure ablation - 9/12.    HEALTH MAINTENANCE.  Physical last visit.  Colonoscopy as outlined.  Mammogram 08/01/12.

## 2013-08-19 ENCOUNTER — Other Ambulatory Visit: Payer: Self-pay | Admitting: *Deleted

## 2013-08-19 ENCOUNTER — Other Ambulatory Visit: Payer: BC Managed Care – PPO

## 2013-08-19 MED ORDER — HYDROCHLOROTHIAZIDE 25 MG PO TABS: ORAL_TABLET | ORAL | Status: DC

## 2013-08-21 ENCOUNTER — Other Ambulatory Visit (INDEPENDENT_AMBULATORY_CARE_PROVIDER_SITE_OTHER): Payer: BC Managed Care – PPO

## 2013-08-21 DIAGNOSIS — R945 Abnormal results of liver function studies: Secondary | ICD-10-CM

## 2013-08-21 DIAGNOSIS — R7309 Other abnormal glucose: Secondary | ICD-10-CM

## 2013-08-21 DIAGNOSIS — R319 Hematuria, unspecified: Secondary | ICD-10-CM

## 2013-08-21 DIAGNOSIS — I10 Essential (primary) hypertension: Secondary | ICD-10-CM

## 2013-08-21 DIAGNOSIS — R739 Hyperglycemia, unspecified: Secondary | ICD-10-CM

## 2013-08-21 DIAGNOSIS — E7800 Pure hypercholesterolemia, unspecified: Secondary | ICD-10-CM

## 2013-08-21 LAB — URINALYSIS, ROUTINE W REFLEX MICROSCOPIC
Bilirubin Urine: NEGATIVE
Ketones, ur: NEGATIVE
LEUKOCYTES UA: NEGATIVE
Nitrite: NEGATIVE
SPECIFIC GRAVITY, URINE: 1.025 (ref 1.000–1.030)
Total Protein, Urine: NEGATIVE
URINE GLUCOSE: NEGATIVE
UROBILINOGEN UA: 0.2 (ref 0.0–1.0)
pH: 6 (ref 5.0–8.0)

## 2013-08-21 LAB — BASIC METABOLIC PANEL
BUN: 12 mg/dL (ref 6–23)
CO2: 29 meq/L (ref 19–32)
Calcium: 9.6 mg/dL (ref 8.4–10.5)
Chloride: 102 mEq/L (ref 96–112)
Creatinine, Ser: 0.6 mg/dL (ref 0.4–1.2)
GFR: 123.85 mL/min (ref >60.00)
GLUCOSE: 89 mg/dL (ref 70–99)
Potassium: 4.3 mEq/L (ref 3.5–5.1)
SODIUM: 138 meq/L (ref 135–145)

## 2013-08-21 LAB — HEPATIC FUNCTION PANEL
ALBUMIN: 4.3 g/dL (ref 3.5–5.2)
ALT: 66 U/L — ABNORMAL HIGH (ref 0–35)
AST: 46 U/L — ABNORMAL HIGH (ref 0–37)
Alkaline Phosphatase: 71 U/L (ref 39–117)
Bilirubin, Direct: 0.2 mg/dL (ref 0.0–0.3)
Total Bilirubin: 0.7 mg/dL (ref 0.2–1.2)
Total Protein: 7.5 g/dL (ref 6.0–8.3)

## 2013-08-21 LAB — LIPID PANEL
Cholesterol: 202 mg/dL — ABNORMAL HIGH (ref 0–200)
HDL: 54.1 mg/dL (ref >39.00)
LDL CALC: 133 mg/dL — AB (ref 0–99)
NONHDL: 147.9
Total CHOL/HDL Ratio: 4
Triglycerides: 75 mg/dL (ref 0.0–149.0)
VLDL: 15 mg/dL (ref 0.0–40.0)

## 2013-08-21 LAB — HEMOGLOBIN A1C: HEMOGLOBIN A1C: 5.9 % (ref 4.6–6.5)

## 2013-08-22 ENCOUNTER — Encounter: Payer: Self-pay | Admitting: Internal Medicine

## 2013-08-30 ENCOUNTER — Ambulatory Visit (INDEPENDENT_AMBULATORY_CARE_PROVIDER_SITE_OTHER): Payer: BC Managed Care – PPO | Admitting: Internal Medicine

## 2013-08-30 ENCOUNTER — Encounter: Payer: Self-pay | Admitting: Internal Medicine

## 2013-08-30 VITALS — BP 120/70 | HR 84 | Temp 98.6°F | Ht 66.0 in | Wt 247.5 lb

## 2013-08-30 DIAGNOSIS — K769 Liver disease, unspecified: Secondary | ICD-10-CM

## 2013-08-30 DIAGNOSIS — R319 Hematuria, unspecified: Secondary | ICD-10-CM

## 2013-08-30 DIAGNOSIS — R7309 Other abnormal glucose: Secondary | ICD-10-CM

## 2013-08-30 DIAGNOSIS — E669 Obesity, unspecified: Secondary | ICD-10-CM

## 2013-08-30 DIAGNOSIS — R739 Hyperglycemia, unspecified: Secondary | ICD-10-CM

## 2013-08-30 DIAGNOSIS — R945 Abnormal results of liver function studies: Secondary | ICD-10-CM

## 2013-08-30 DIAGNOSIS — M5126 Other intervertebral disc displacement, lumbar region: Secondary | ICD-10-CM

## 2013-08-30 DIAGNOSIS — E7800 Pure hypercholesterolemia, unspecified: Secondary | ICD-10-CM

## 2013-08-30 DIAGNOSIS — I10 Essential (primary) hypertension: Secondary | ICD-10-CM

## 2013-08-30 NOTE — Progress Notes (Signed)
Pre visit review using our clinic review tool, if applicable. No additional management support is needed unless otherwise documented below in the visit note. 

## 2013-09-05 ENCOUNTER — Encounter: Payer: Self-pay | Admitting: Internal Medicine

## 2013-09-05 DIAGNOSIS — Z6841 Body Mass Index (BMI) 40.0 and over, adult: Secondary | ICD-10-CM | POA: Insufficient documentation

## 2013-09-05 NOTE — Assessment & Plan Note (Signed)
Blood pressure doing well.  Same meds.  Follow metabolic panel.   

## 2013-09-05 NOTE — Assessment & Plan Note (Signed)
Low cholesterol diet and exercise.  Discussed medication.  She refuses.  Follow.

## 2013-09-05 NOTE — Progress Notes (Signed)
Subjective:    Patient ID: Joy Houston, female    DOB: 03-Apr-1965, 48 y.o.   MRN: [id number]  HPI 48 year old female with past history of gestational diabetes, hypertension and presumed fatty liver who comes in today for a scheduled follow up.  Is s/p back surgery for a ruptured disc.  Has been doing well.  No acid reflux.  No significant abdominal pain or cramping.   Breathing stable.   Desires to lose weight.  Have discussed at length with her.  We again discussed today.  We discussed her cholesterol and fatty liver.  Discussed cholesterol medication.  She declines.  Bowel movements are normal.  Has started working with a Physiological scientist.  Is going to do more exercise. Overall feels she is doing relatively well.  Just returned from cruise.     Past Medical History  Diagnosis Date  . Complication of anesthesia   . PONV (postoperative nausea and vomiting)   . Family history of anesthesia complication     Mother - confusion  . Hypertension   . Heart murmur     Slight - "nothing to worry about"  . Hemorrhoid   . Eczema   . Fatty liver     Current Outpatient Prescriptions on File Prior to Visit  Medication Sig Dispense Refill  . hydrochlorothiazide (HYDRODIURIL) 25 MG tablet TAKE 1 TABLET (25 MG TOTAL) BY MOUTH DAILY.  30 tablet  0  . hydrocortisone (ANUSOL-HC) 25 MG suppository Place 25 mg rectally 2 (two) times daily as needed.      Marland Kitchen ibuprofen (ADVIL,MOTRIN) 800 MG tablet Take 1 tablet (800 mg total) by mouth every 8 (eight) hours as needed for pain.  30 tablet  0  . lisinopril (PRINIVIL,ZESTRIL) 10 MG tablet TAKE 1 TABLET BY MOUTH EVERY DAY  90 tablet  1   No current facility-administered medications on file prior to visit.    Review of Systems Patient denies any headache, lightheadedness or dizziness.  No significant sinus or allergy symptoms.  No chest pain, tightness or palpitations.  No cough or congestion.  No sob.   No sob with increased activity or exertion.  No nausea  or vomiting.  No acid reflux. No abdominal pain or cramping.   No bowel change, such as diarrhea, constipation, BRBPR or melana.  States her bowels are stable.  No urine change.  Back stable.  Starting to exercise more.          Objective:   Physical Exam  Filed Vitals:   08/30/13 1507  BP: 120/70  Pulse: 84  Temp: 98.6 F (61 C)   48 year old female in no acute distress.   HEENT:  Nares- clear.  Oropharynx - without lesions. NECK:  Supple.  Nontender.  No audible bruit.  HEART:  Appears to be regular. LUNGS:  No crackles or wheezing audible.  Respirations even and unlabored.  RADIAL PULSE:  Equal bilaterally.    ABDOMEN:  Soft, nontender.  Bowel sounds present and normal.  No audible abdominal bruit.   EXTREMITIES:  No increased edema present.  DP pulses palpable and equal bilaterally.  FEET:  No lesions.            Assessment & Plan:  MSK.  S/P back surgery.  Following with Dr Arnoldo Morale.  Stable.      HEMORRHOIDS.  Stable.  Notify me if persistent problems.    GI.  Colonoscopy 12/16/10 revealed diverticulosis, internal hemorrhoids and one 59mm polyp in the proximal  sigmoid and one 29mm polyp in the distal sigmoid.  Bowels currently stable.     GYN.  Saw Dr Rayford Halsted.  S/p Novasure ablation - 9/12.    HEALTH MAINTENANCE.  Physical 04/22/13.  Colonoscopy as outlined.  Mammogram 08/01/12.  Schedule f/u mammogram.

## 2013-09-05 NOTE — Assessment & Plan Note (Signed)
Low carb diet, exercise and weight loss.  Follow met b and a1c.  

## 2013-09-05 NOTE — Assessment & Plan Note (Signed)
Discussed diet, exercise and need for weight loss.  Follow.

## 2013-09-05 NOTE — Assessment & Plan Note (Addendum)
Saw Dr Bernardo Heater.  Follow urinalysis.  Urinalysis 08/21/13 - no rbc's.

## 2013-09-05 NOTE — Assessment & Plan Note (Signed)
S/p surgery.  Seeing Dr Arnoldo Morale.  Stable.

## 2013-09-05 NOTE — Assessment & Plan Note (Signed)
Presumed to be fatty liver.  Dscussed need for weight loss, diet adjustment and exercise.  Start vitamin E.   Follow liver panel

## 2013-09-16 ENCOUNTER — Other Ambulatory Visit: Payer: Self-pay | Admitting: Internal Medicine

## 2013-09-22 ENCOUNTER — Other Ambulatory Visit: Payer: Self-pay | Admitting: Internal Medicine

## 2013-10-24 ENCOUNTER — Other Ambulatory Visit: Payer: Self-pay | Admitting: Internal Medicine

## 2013-11-01 ENCOUNTER — Other Ambulatory Visit: Payer: Self-pay | Admitting: Internal Medicine

## 2013-12-13 ENCOUNTER — Other Ambulatory Visit: Payer: BC Managed Care – PPO

## 2013-12-31 ENCOUNTER — Encounter: Payer: Self-pay | Admitting: Internal Medicine

## 2013-12-31 ENCOUNTER — Ambulatory Visit (INDEPENDENT_AMBULATORY_CARE_PROVIDER_SITE_OTHER): Payer: BC Managed Care – PPO | Admitting: Internal Medicine

## 2013-12-31 ENCOUNTER — Other Ambulatory Visit (INDEPENDENT_AMBULATORY_CARE_PROVIDER_SITE_OTHER): Payer: BC Managed Care – PPO

## 2013-12-31 VITALS — BP 115/73 | HR 96 | Temp 98.0°F | Ht 66.0 in | Wt 248.0 lb

## 2013-12-31 DIAGNOSIS — K7689 Other specified diseases of liver: Secondary | ICD-10-CM

## 2013-12-31 DIAGNOSIS — R945 Abnormal results of liver function studies: Secondary | ICD-10-CM

## 2013-12-31 DIAGNOSIS — E7800 Pure hypercholesterolemia, unspecified: Secondary | ICD-10-CM

## 2013-12-31 DIAGNOSIS — I10 Essential (primary) hypertension: Secondary | ICD-10-CM

## 2013-12-31 DIAGNOSIS — R739 Hyperglycemia, unspecified: Secondary | ICD-10-CM

## 2013-12-31 DIAGNOSIS — R0602 Shortness of breath: Secondary | ICD-10-CM

## 2013-12-31 DIAGNOSIS — E669 Obesity, unspecified: Secondary | ICD-10-CM

## 2013-12-31 DIAGNOSIS — R319 Hematuria, unspecified: Secondary | ICD-10-CM

## 2013-12-31 LAB — LIPID PANEL
CHOLESTEROL: 206 mg/dL — AB (ref 0–200)
HDL: 49.9 mg/dL (ref >39.00)
LDL CALC: 140 mg/dL — AB (ref 0–99)
NonHDL: 156.1
TRIGLYCERIDES: 83 mg/dL (ref 0.0–149.0)
Total CHOL/HDL Ratio: 4
VLDL: 16.6 mg/dL (ref 0.0–40.0)

## 2013-12-31 LAB — BASIC METABOLIC PANEL
BUN: 14 mg/dL (ref 6–23)
CALCIUM: 9.3 mg/dL (ref 8.4–10.5)
CO2: 27 mEq/L (ref 19–32)
Chloride: 102 mEq/L (ref 96–112)
Creatinine, Ser: 0.7 mg/dL (ref 0.4–1.2)
GFR: 94.03 mL/min (ref >60.00)
Glucose, Bld: 105 mg/dL — ABNORMAL HIGH (ref 70–99)
POTASSIUM: 3.8 meq/L (ref 3.5–5.1)
SODIUM: 136 meq/L (ref 135–145)

## 2013-12-31 LAB — HEPATIC FUNCTION PANEL
ALT: 74 U/L — AB (ref 0–35)
AST: 59 U/L — AB (ref 0–37)
Albumin: 3.6 g/dL (ref 3.5–5.2)
Alkaline Phosphatase: 66 U/L (ref 39–117)
Bilirubin, Direct: 0.1 mg/dL (ref 0.0–0.3)
Total Bilirubin: 0.6 mg/dL (ref 0.2–1.2)
Total Protein: 7.8 g/dL (ref 6.0–8.3)

## 2013-12-31 LAB — HEMOGLOBIN A1C: HEMOGLOBIN A1C: 5.8 % (ref 4.6–6.5)

## 2013-12-31 MED ORDER — HYDROCHLOROTHIAZIDE 25 MG PO TABS: 25.0000 mg | ORAL_TABLET | Freq: Every day | ORAL | Status: DC

## 2013-12-31 MED ORDER — LISINOPRIL 10 MG PO TABS: 10.0000 mg | ORAL_TABLET | Freq: Every day | ORAL | Status: DC

## 2013-12-31 NOTE — Progress Notes (Signed)
Pre visit review using our clinic review tool, if applicable. No additional management support is needed unless otherwise documented below in the visit note. 

## 2014-01-05 ENCOUNTER — Encounter: Payer: Self-pay | Admitting: Internal Medicine

## 2014-01-05 NOTE — Progress Notes (Signed)
Subjective:    Patient ID: Joy Houston, female    DOB: September 21, 1965, 48 y.o.   MRN: [id number]  HPI 48 year old female with past history of gestational diabetes, hypertension and presumed fatty liver who comes in today for a scheduled follow up.  Is s/p back surgery for a ruptured disc.  Has been doing well.  No acid reflux.  Some sob with exertion.  Desires to lose weight.  Have discussed at length with her.  We again discussed today.  We discussed her cholesterol and fatty liver.  Discussed my desire for f/u abdominal ultrasound.  Discussed cholesterol medication.  She declines.  Bowel movements are normal.  She does report some fullness in her right side.  No significant pain.  No nausea or vomiting.  Eating well.  Some bloating.     Past Medical History  Diagnosis Date  . Complication of anesthesia   . PONV (postoperative nausea and vomiting)   . Family history of anesthesia complication     Mother - confusion  . Hypertension   . Heart murmur     Slight - "nothing to worry about"  . Hemorrhoid   . Eczema   . Fatty liver     Current Outpatient Prescriptions on File Prior to Visit  Medication Sig Dispense Refill  . hydrocortisone (ANUSOL-HC) 25 MG suppository Place 25 mg rectally 2 (two) times daily as needed.    Marland Kitchen ibuprofen (ADVIL,MOTRIN) 800 MG tablet Take 1 tablet (800 mg total) by mouth every 8 (eight) hours as needed for pain. 30 tablet 0   No current facility-administered medications on file prior to visit.    Review of Systems Patient denies any headache, lightheadedness or dizziness.  No significant sinus or allergy symptoms.  No chest pain, tightness or palpitations.  No cough or congestion.  Some sob with exertion.  She relates to being out of shape.  No nausea or vomiting.  No acid reflux. Right side abdominal discomfort as outlined.  No bowel change, such as diarrhea, constipation, BRBPR or melana.  States her bowels are stable.  No urine change.  Back stable.  We  again discussed diet, exercise and weight loss.      Objective:   Physical Exam  Filed Vitals:   12/31/13 1551  BP: 115/73  Pulse: 96  Temp: 98 F (36.7 C)   Blood pressure recheck:  33/12  48 year old female in no acute distress.   HEENT:  Nares- clear.  Oropharynx - without lesions. NECK:  Supple.  Nontender.  No audible bruit.  HEART:  Appears to be regular. LUNGS:  No crackles or wheezing audible.  Respirations even and unlabored.  RADIAL PULSE:  Equal bilaterally.    ABDOMEN:  Soft, nontender.  Bowel sounds present and normal.  No audible abdominal bruit.   EXTREMITIES:  No increased edema present.  DP pulses palpable and equal bilaterally.  FEET:  No lesions.            Assessment & Plan:  MSK.  S/P back surgery.  Following with Dr Arnoldo Morale.  Stable.      HEMORRHOIDS.  Stable.  Notify me if persistent problems.    GI.  Colonoscopy 12/16/10 revealed diverticulosis, internal hemorrhoids and one 61mm polyp in the proximal sigmoid and one 57mm polyp in the distal sigmoid.  Bowels currently stable.     GYN.  Saw Dr Rayford Halsted.  S/p Novasure ablation - 9/12.    SOB (shortness of breath) on exertion Reported  sob with exertion.   - EKG 12-Lead - no acute ischemic changes.  Discussed with her regarding further w/up.  She declines.  Wants to follow.    Essential hypertension Blood pressure controlled.  Same medications regimen.  Follow.   Abnormal liver function Persistent elevation, but stable.  Feel related to fatty liver.  Has seen GI.  Discussed diet, exercise and weight loss.  Discussed the need for f/u abdominal ultrasound.  She declines.    Hypercholesterolemia Low cholesterol diet and exercise.  Follow.   Lab Results  Component Value Date   CHOL 206* 12/31/2013   HDL 49.90 12/31/2013   LDLCALC 140* 12/31/2013   LDLDIRECT 145.4 03/01/2013   TRIG 83.0 12/31/2013   CHOLHDL 4 12/31/2013   Hematuria Saw Dr Bernardo Heater.  09/04/13 urinalysis - no rbc's.     Hyperglycemia Low carb diet, exercise and weight loss.   Lab Results  Component Value Date   HGBA1C 5.8 12/31/2013   Obesity (BMI 30-39.9) Discussed diet, exercise and weight loss.    HEALTH MAINTENANCE.  Physical 04/22/13.  Colonoscopy as outlined.  Mammogram 08/01/12.  Schedule f/u mammogram. Overdue.

## 2014-01-27 ENCOUNTER — Other Ambulatory Visit: Payer: Self-pay | Admitting: Internal Medicine

## 2014-05-02 ENCOUNTER — Other Ambulatory Visit: Payer: BC Managed Care – PPO

## 2014-05-05 ENCOUNTER — Other Ambulatory Visit (HOSPITAL_COMMUNITY)
Admission: RE | Admit: 2014-05-05 | Discharge: 2014-05-05 | Disposition: A | Discharge status: Home / Self Care | Payer: BLUE CROSS/BLUE SHIELD | Source: Ambulatory Visit | Attending: Internal Medicine | Admitting: Internal Medicine

## 2014-05-05 ENCOUNTER — Ambulatory Visit (INDEPENDENT_AMBULATORY_CARE_PROVIDER_SITE_OTHER): Payer: BLUE CROSS/BLUE SHIELD | Admitting: Internal Medicine

## 2014-05-05 ENCOUNTER — Encounter: Payer: Self-pay | Admitting: Internal Medicine

## 2014-05-05 VITALS — BP 132/81 | HR 75 | Temp 98.4°F | Ht 65.5 in | Wt 252.5 lb

## 2014-05-05 DIAGNOSIS — Z1151 Encounter for screening for human papillomavirus (HPV): Secondary | ICD-10-CM | POA: Insufficient documentation

## 2014-05-05 DIAGNOSIS — Z0000 Encounter for general adult medical examination without abnormal findings: Secondary | ICD-10-CM

## 2014-05-05 DIAGNOSIS — R945 Abnormal results of liver function studies: Secondary | ICD-10-CM

## 2014-05-05 DIAGNOSIS — Z01419 Encounter for gynecological examination (general) (routine) without abnormal findings: Secondary | ICD-10-CM | POA: Insufficient documentation

## 2014-05-05 DIAGNOSIS — E7800 Pure hypercholesterolemia, unspecified: Secondary | ICD-10-CM

## 2014-05-05 DIAGNOSIS — K7689 Other specified diseases of liver: Secondary | ICD-10-CM

## 2014-05-05 DIAGNOSIS — E669 Obesity, unspecified: Secondary | ICD-10-CM

## 2014-05-05 DIAGNOSIS — I10 Essential (primary) hypertension: Secondary | ICD-10-CM

## 2014-05-05 DIAGNOSIS — H539 Unspecified visual disturbance: Secondary | ICD-10-CM

## 2014-05-05 DIAGNOSIS — N760 Acute vaginitis: Secondary | ICD-10-CM

## 2014-05-05 DIAGNOSIS — R739 Hyperglycemia, unspecified: Secondary | ICD-10-CM

## 2014-05-05 NOTE — Progress Notes (Signed)
Pre visit review using our clinic review tool, if applicable. No additional management support is needed unless otherwise documented below in the visit note. 

## 2014-05-05 NOTE — Progress Notes (Signed)
Patient ID: Joy Houston, female   DOB: Apr 19, 1965, 49 y.o.   MRN: [id number]   Subjective:    Patient ID: Joy Houston, female    DOB: [birth year], 49 y.o.   MRN: [id number]  HPI  Patient here for her physical exam.  Reports noticed last night (while watching TV) some blurred vision.  She describes it as - looking through a pond.  Involved her left visual field.  Unsure if one eye or both eyes.  Lasted approximately 10-15 minutes.  She had another episode this am.  Same blurring.  Lasted 10-15 minutes.  No headache.  No sinus pressure or congestion.  Breathing stable.  Is not exercising.  Planning to start weight watchers today.     Past Medical History  Diagnosis Date  . Complication of anesthesia   . PONV (postoperative nausea and vomiting)   . Family history of anesthesia complication     Mother - confusion  . Hypertension   . Heart murmur     Slight - "nothing to worry about"  . Hemorrhoid   . Eczema   . Fatty liver     Current Outpatient Prescriptions on File Prior to Visit  Medication Sig Dispense Refill  . hydrochlorothiazide (HYDRODIURIL) 25 MG tablet Take 1 tablet (25 mg total) by mouth daily. 90 tablet 3  . hydrocortisone (ANUSOL-HC) 25 MG suppository Place 25 mg rectally 2 (two) times daily as needed.    Marland Kitchen ibuprofen (ADVIL,MOTRIN) 800 MG tablet Take 1 tablet (800 mg total) by mouth every 8 (eight) hours as needed for pain. 30 tablet 0  . lisinopril (PRINIVIL,ZESTRIL) 10 MG tablet TAKE 1 TABLET BY MOUTH EVERY DAY 90 tablet 0   No current facility-administered medications on file prior to visit.    Review of Systems  Constitutional: Negative for fever and appetite change.  HENT: Negative for congestion, sinus pressure and sore throat.   Eyes: Positive for visual disturbance (vision change as outlined.  ). Negative for pain and discharge.  Respiratory: Negative for cough, chest tightness and shortness of breath.   Cardiovascular: Negative for chest pain,  palpitations and leg swelling.  Gastrointestinal: Negative for nausea, vomiting, abdominal pain and diarrhea.  Genitourinary: Negative for dysuria and difficulty urinating.  Musculoskeletal: Positive for neck pain. Negative for joint swelling.  Skin: Negative for color change and rash.  Neurological: Negative for dizziness, light-headedness and headaches.  Hematological: Negative for adenopathy. Does not bruise/bleed easily.  Psychiatric/Behavioral: Negative for dysphoric mood and agitation.       Objective:    Physical Exam  Constitutional: She is oriented to person, place, and time. She appears well-developed and well-nourished.  HENT:  Nose: Nose normal.  Mouth/Throat: Oropharynx is clear and moist.  Eyes: Right eye exhibits no discharge. Left eye exhibits no discharge. No scleral icterus.  Neck: Neck supple. No thyromegaly present.  Cardiovascular: Normal rate and regular rhythm.   Pulmonary/Chest: Breath sounds normal. No accessory muscle usage. No tachypnea. No respiratory distress. She has no decreased breath sounds. She has no wheezes. She has no rhonchi. Right breast exhibits no inverted nipple, no mass, no nipple discharge and no tenderness (no axillary adenopathy). Left breast exhibits no inverted nipple, no mass, no nipple discharge and no tenderness (no axilarry adenopathy).  Abdominal: Soft. Bowel sounds are normal. There is no tenderness.  Genitourinary:  Normal external genitalia.  Vaginal vault without lesions.  Cervix identified.  Pap smear performed.  Could not appreciate any adnexal masses or tenderness.  Discharge  present.  KOH/wet prep sent.    Musculoskeletal: She exhibits no edema or tenderness.  Lymphadenopathy:    She has no cervical adenopathy.  Neurological: She is alert and oriented to person, place, and time.  Skin: Skin is warm. No rash noted.  Psychiatric: She has a normal mood and affect. Her behavior is normal.    BP 132/81 mmHg  Pulse 75   Temp(Src) 98.4 F (36.9 C) (Oral)  Ht 5' 5.5" (1.664 m)  Wt 252 lb 8 oz (114.533 kg)  BMI 41.36 kg/m2  SpO2 95% Wt Readings from Last 3 Encounters:  05/05/14 252 lb 8 oz (114.533 kg)  12/31/13 248 lb (112.492 kg)  08/30/13 247 lb 8 oz (112.265 kg)     Lab Results  Component Value Date   WBC 10.4 03/01/2013   HGB 14.8 03/01/2013   HCT 43.2 03/01/2013   PLT 294.0 03/01/2013   GLUCOSE 105* 12/31/2013   CHOL 206* 12/31/2013   TRIG 83.0 12/31/2013   HDL 49.90 12/31/2013   LDLDIRECT 145.4 03/01/2013   LDLCALC 140* 12/31/2013   ALT 74* 12/31/2013   AST 59* 12/31/2013   NA 136 12/31/2013   K 3.8 12/31/2013   CL 102 12/31/2013   CREATININE 0.7 12/31/2013   BUN 14 12/31/2013   CO2 27 12/31/2013   TSH 3.06 03/01/2013   HGBA1C 5.8 12/31/2013   MICROALBUR 0.8 03/01/2013       Assessment & Plan:   Problem List Items Addressed This Visit    Abnormal liver function    Presumed to be fatty liver.  Have discussed diet, exercise and weight loss.  She is starting weight watchers today.         Change in vision - Primary    Vision change as outlined.  Two episodes.  Unclear etiology.  Discussed further w/up including MRI, etc.  She declines.  Did agree to be referral to opthalmology.  Start ECASA 38m q day.        Relevant Orders   Ambulatory referral to Ophthalmology   Cytology - PAP   WET PREP BY MOLECULAR PROBE (Completed)   Health care maintenance    Physical today.  PAP today.  State had mammogram for 2015.  Obtain records.  If has not had, will need to schedule.  Colonoscopy 11-2010.        Hypercholesterolemia    Low cholesterol diet and exercise.  Follow lipid panel.  She has declined cholesterol medication.       Hyperglycemia    Low carb diet, exercise and weight loss.  Follow met b and a1c.       Hypertension    Blood pressure is doing well.  Continue same medication regimen.  Check metabolic panel.       Obesity (BMI 30-39.9)    Discussed diet and  exercise.        Vaginitis    Some discharge present.  KOH/wet prep.         I spent 25 minutes with the patient and more than 50% of the time was spent in consultation regarding the above.     SEinar Pheasant MD

## 2014-05-06 ENCOUNTER — Other Ambulatory Visit (INDEPENDENT_AMBULATORY_CARE_PROVIDER_SITE_OTHER): Payer: BLUE CROSS/BLUE SHIELD

## 2014-05-06 ENCOUNTER — Encounter: Payer: Self-pay | Admitting: Internal Medicine

## 2014-05-06 ENCOUNTER — Encounter: Payer: Self-pay | Admitting: *Deleted

## 2014-05-06 DIAGNOSIS — R739 Hyperglycemia, unspecified: Secondary | ICD-10-CM

## 2014-05-06 DIAGNOSIS — E669 Obesity, unspecified: Secondary | ICD-10-CM

## 2014-05-06 DIAGNOSIS — N760 Acute vaginitis: Secondary | ICD-10-CM | POA: Insufficient documentation

## 2014-05-06 DIAGNOSIS — E7800 Pure hypercholesterolemia, unspecified: Secondary | ICD-10-CM

## 2014-05-06 DIAGNOSIS — R319 Hematuria, unspecified: Secondary | ICD-10-CM

## 2014-05-06 DIAGNOSIS — I10 Essential (primary) hypertension: Secondary | ICD-10-CM

## 2014-05-06 DIAGNOSIS — R945 Abnormal results of liver function studies: Secondary | ICD-10-CM

## 2014-05-06 DIAGNOSIS — H539 Unspecified visual disturbance: Secondary | ICD-10-CM | POA: Insufficient documentation

## 2014-05-06 DIAGNOSIS — K7689 Other specified diseases of liver: Secondary | ICD-10-CM

## 2014-05-06 DIAGNOSIS — Z0000 Encounter for general adult medical examination without abnormal findings: Secondary | ICD-10-CM | POA: Insufficient documentation

## 2014-05-06 LAB — TSH: TSH: 3.59 u[IU]/mL (ref 0.35–4.50)

## 2014-05-06 LAB — BASIC METABOLIC PANEL
BUN: 13 mg/dL (ref 6–23)
CO2: 31 mEq/L (ref 19–32)
Calcium: 9.8 mg/dL (ref 8.4–10.5)
Chloride: 99 mEq/L (ref 96–112)
Creatinine, Ser: 0.62 mg/dL (ref 0.40–1.20)
GFR: 109.79 mL/min (ref >60.00)
Glucose, Bld: 89 mg/dL (ref 70–99)
Potassium: 3.9 mEq/L (ref 3.5–5.1)
SODIUM: 135 meq/L (ref 135–145)

## 2014-05-06 LAB — WET PREP BY MOLECULAR PROBE
CANDIDA SPECIES: NEGATIVE
Gardnerella vaginalis: NEGATIVE
TRICHOMONAS VAG: NEGATIVE

## 2014-05-06 LAB — CBC WITH DIFFERENTIAL/PLATELET
BASOS PCT: 0.5 % (ref 0.0–3.0)
Basophils Absolute: 0.1 10*3/uL (ref 0.0–0.1)
EOS ABS: 0.1 10*3/uL (ref 0.0–0.7)
Eosinophils Relative: 1.1 % (ref 0.0–5.0)
HCT: 43.7 % (ref 36.0–46.0)
HEMOGLOBIN: 15.2 g/dL — AB (ref 12.0–15.0)
Lymphocytes Relative: 25.7 % (ref 12.0–46.0)
Lymphs Abs: 2.4 10*3/uL (ref 0.7–4.0)
MCHC: 34.8 g/dL (ref 30.0–36.0)
MCV: 89.1 fl (ref 78.0–100.0)
Monocytes Absolute: 0.6 10*3/uL (ref 0.1–1.0)
Monocytes Relative: 5.8 % (ref 3.0–12.0)
NEUTROS PCT: 66.9 % (ref 43.0–77.0)
Neutro Abs: 6.3 10*3/uL (ref 1.4–7.7)
PLATELETS: 288 10*3/uL (ref 150.0–400.0)
RBC: 4.9 Mil/uL (ref 3.87–5.11)
RDW: 13.3 % (ref 11.5–15.5)
WBC: 9.5 10*3/uL (ref 4.0–10.5)

## 2014-05-06 LAB — MICROALBUMIN / CREATININE URINE RATIO
Creatinine,U: 124.9 mg/dL
MICROALB UR: 0.9 mg/dL (ref 0.0–1.9)
Microalb Creat Ratio: 0.7 mg/g (ref 0.0–30.0)

## 2014-05-06 LAB — LIPID PANEL
CHOL/HDL RATIO: 4
Cholesterol: 219 mg/dL — ABNORMAL HIGH (ref 0–200)
HDL: 56.1 mg/dL (ref >39.00)
LDL CALC: 143 mg/dL — AB (ref 0–99)
NonHDL: 162.9
TRIGLYCERIDES: 102 mg/dL (ref 0.0–149.0)
VLDL: 20.4 mg/dL (ref 0.0–40.0)

## 2014-05-06 LAB — HEMOGLOBIN A1C: Hgb A1c MFr Bld: 6.1 % (ref 4.6–6.5)

## 2014-05-06 LAB — HEPATIC FUNCTION PANEL
ALBUMIN: 4.6 g/dL (ref 3.5–5.2)
ALT: 51 U/L — AB (ref 0–35)
AST: 35 U/L (ref 0–37)
Alkaline Phosphatase: 68 U/L (ref 39–117)
BILIRUBIN TOTAL: 0.5 mg/dL (ref 0.2–1.2)
Bilirubin, Direct: 0.1 mg/dL (ref 0.0–0.3)
Total Protein: 7.5 g/dL (ref 6.0–8.3)

## 2014-05-06 NOTE — Assessment & Plan Note (Signed)
Vision change as outlined.  Two episodes.  Unclear etiology.  Discussed further w/up including MRI, etc.  She declines.  Did agree to be referral to opthalmology.  Start ECASA 81mg  q day.

## 2014-05-06 NOTE — Assessment & Plan Note (Signed)
Physical today.  PAP today.  State had mammogram for 2015.  Obtain records.  If has not had, will need to schedule.  Colonoscopy 11-2010.

## 2014-05-06 NOTE — Assessment & Plan Note (Signed)
Low carb diet, exercise and weight loss.  Follow met b and a1c.

## 2014-05-06 NOTE — Assessment & Plan Note (Addendum)
Presumed to be fatty liver.  Have discussed diet, exercise and weight loss.  She is starting weight watchers today.

## 2014-05-06 NOTE — Assessment & Plan Note (Signed)
Some discharge present.  KOH/wet prep.

## 2014-05-06 NOTE — Assessment & Plan Note (Signed)
Discussed diet and exercise 

## 2014-05-06 NOTE — Assessment & Plan Note (Signed)
Low cholesterol diet and exercise.  Follow lipid panel.  She has declined cholesterol medication.  

## 2014-05-06 NOTE — Assessment & Plan Note (Signed)
Blood pressure is doing well.  Continue same medication regimen.  Check metabolic panel.

## 2014-05-07 ENCOUNTER — Encounter: Payer: Self-pay | Admitting: Internal Medicine

## 2014-05-07 LAB — CYTOLOGY - PAP

## 2014-05-08 NOTE — Telephone Encounter (Signed)
Mailed unread message to pt  

## 2014-05-14 NOTE — Telephone Encounter (Signed)
Unread mychart message mailed to patient 

## 2014-05-20 ENCOUNTER — Other Ambulatory Visit: Payer: Self-pay | Admitting: Internal Medicine

## 2014-09-11 ENCOUNTER — Encounter: Payer: Self-pay | Admitting: Internal Medicine

## 2014-09-11 ENCOUNTER — Ambulatory Visit (INDEPENDENT_AMBULATORY_CARE_PROVIDER_SITE_OTHER): Payer: BLUE CROSS/BLUE SHIELD | Admitting: Internal Medicine

## 2014-09-11 VITALS — BP 120/73 | HR 89 | Temp 97.7°F | Ht 65.5 in | Wt 252.0 lb

## 2014-09-11 DIAGNOSIS — Z0000 Encounter for general adult medical examination without abnormal findings: Secondary | ICD-10-CM

## 2014-09-11 DIAGNOSIS — R945 Abnormal results of liver function studies: Secondary | ICD-10-CM

## 2014-09-11 DIAGNOSIS — E7800 Pure hypercholesterolemia, unspecified: Secondary | ICD-10-CM

## 2014-09-11 DIAGNOSIS — K7689 Other specified diseases of liver: Secondary | ICD-10-CM

## 2014-09-11 DIAGNOSIS — Z1239 Encounter for other screening for malignant neoplasm of breast: Secondary | ICD-10-CM

## 2014-09-11 DIAGNOSIS — R319 Hematuria, unspecified: Secondary | ICD-10-CM

## 2014-09-11 DIAGNOSIS — I10 Essential (primary) hypertension: Secondary | ICD-10-CM | POA: Diagnosis not present

## 2014-09-11 DIAGNOSIS — R739 Hyperglycemia, unspecified: Secondary | ICD-10-CM

## 2014-09-11 DIAGNOSIS — L723 Sebaceous cyst: Secondary | ICD-10-CM

## 2014-09-11 DIAGNOSIS — E669 Obesity, unspecified: Secondary | ICD-10-CM

## 2014-09-11 NOTE — Progress Notes (Signed)
Pre visit review using our clinic review tool, if applicable. No additional management support is needed unless otherwise documented below in the visit note. 

## 2014-09-11 NOTE — Progress Notes (Signed)
Patient ID: Joy Houston, female   DOB: 1965/08/29, 49 y.o.   MRN: [id number]   Subjective:    Patient ID: Joy Houston, female    DOB: 05-15-1965, 49 y.o.   MRN: [id number]  HPI  Patient here for a scheduled follow up.  She has not been exercising.  Not watching her diet.  Discussed the need for diet and exercise.  She reports noticing a "cyst" - left axilla.  Has these intermittently.  No pain.  No increase in size.  The previous left elbow pain - better.  Has been doing accupuncture and "healing touch".  Much better.  No nausea or vomiting.  No bowel change.     Past Medical History  Diagnosis Date  . Complication of anesthesia   . PONV (postoperative nausea and vomiting)   . Family history of anesthesia complication     Mother - confusion  . Hypertension   . Heart murmur     Slight - "nothing to worry about"  . Hemorrhoid   . Eczema   . Fatty liver     Outpatient Encounter Prescriptions as of 09/11/2014  Medication Sig  . doxycycline (VIBRAMYCIN) 100 MG capsule Take 100 mg by mouth 2 (two) times daily.  . hydrochlorothiazide (HYDRODIURIL) 25 MG tablet Take 1 tablet (25 mg total) by mouth daily.  Marland Kitchen ibuprofen (ADVIL,MOTRIN) 800 MG tablet Take 1 tablet (800 mg total) by mouth every 8 (eight) hours as needed for pain.  Marland Kitchen lisinopril (PRINIVIL,ZESTRIL) 10 MG tablet TAKE 1 TABLET BY MOUTH EVERY DAY  . tobramycin (TOBREX) 0.3 % ophthalmic solution ADMINISTER 1-2 DROPS TO THE RIGHT EYE EVERY 4 (FOUR) HOURS FOR 7 DAYS. AVOID IN LACTATION.  . hydrocortisone (ANUSOL-HC) 25 MG suppository Place 25 mg rectally 2 (two) times daily as needed.   No facility-administered encounter medications on file as of 09/11/2014.    Review of Systems  Constitutional: Positive for fatigue. Negative for appetite change and unexpected weight change.       Not watching her diet.  Not exercising.   HENT: Negative for congestion and sinus pressure.   Respiratory: Negative for cough, chest tightness and  shortness of breath.   Cardiovascular: Negative for chest pain, palpitations and leg swelling.  Gastrointestinal: Negative for nausea, vomiting, abdominal pain and diarrhea.  Genitourinary: Negative for dysuria and difficulty urinating.  Musculoskeletal:       Elbow pain is better.   Skin: Negative for color change and rash.       Left axilla nodule/cyst.   Neurological: Negative for dizziness, light-headedness and headaches.  Psychiatric/Behavioral: Negative for dysphoric mood and agitation.       Objective:     Blood pressure recheck:  120/76  Physical Exam  Constitutional: She appears well-developed and well-nourished. No distress.  HENT:  Nose: Nose normal.  Mouth/Throat: Oropharynx is clear and moist.  Neck: Neck supple. No thyromegaly present.  Cardiovascular: Normal rate and regular rhythm.   Pulmonary/Chest: Breath sounds normal. No respiratory distress. She has no wheezes.  Abdominal: Soft. Bowel sounds are normal. There is no tenderness.  Musculoskeletal: She exhibits no edema or tenderness.  Lymphadenopathy:    She has no cervical adenopathy.  Skin: No rash noted. No erythema.  Appears to have sebaceous cyst present - left axilla.  No erythema.    Psychiatric: She has a normal mood and affect. Her behavior is normal.    BP 120/73 mmHg  Pulse 89  Temp(Src) 97.7 F (36.5 C) (Oral)  Ht  5' 5.5" (1.664 m)  Wt 252 lb (114.306 kg)  BMI 41.28 kg/m2  SpO2 95% Wt Readings from Last 3 Encounters:  09/11/14 252 lb (114.306 kg)  05/05/14 252 lb 8 oz (114.533 kg)  12/31/13 248 lb (112.492 kg)     Lab Results  Component Value Date   WBC 9.5 05/06/2014   HGB 15.2* 05/06/2014   HCT 43.7 05/06/2014   PLT 288.0 05/06/2014   GLUCOSE 89 05/06/2014   CHOL 219* 05/06/2014   TRIG 102.0 05/06/2014   HDL 56.10 05/06/2014   LDLDIRECT 145.4 03/01/2013   LDLCALC 143* 05/06/2014   ALT 51* 05/06/2014   AST 35 05/06/2014   NA 135 05/06/2014   K 3.9 05/06/2014   CL 99  05/06/2014   CREATININE 0.62 05/06/2014   BUN 13 05/06/2014   CO2 31 05/06/2014   TSH 3.59 05/06/2014   HGBA1C 6.1 05/06/2014   MICROALBUR 0.9 05/06/2014       Assessment & Plan:   Problem List Items Addressed This Visit    Abnormal liver function    Presumed to be fatty liver.  Discussed diet and exercise.  Discussed the need for weight loss.       Relevant Orders   Hepatic function panel   Health care maintenance    Physical 05/05/14.  PAP 05/05/14 negative with negative HPV.  Missed mammogram for 2015.  Schedule for mammogram.  Colonoscopy 11/2010.       Hematuria    Saw Dr Bernardo Heater.  Follow urinalysis.  Urinalysis 08/21/13 - no rbc's.  Recheck with next labs.        Relevant Orders   Urinalysis, Routine w reflex microscopic (not at [facility])   Hypercholesterolemia    Low cholesterol diet and exercise.  Follow lipid panel.        Relevant Orders   Lipid panel   Hyperglycemia    Low carb diet and exercise.  Follow met b and a1c.       Relevant Orders   Hemoglobin A1c   Hypertension    Blood pressure under good control.  Continue same medication regimen.  Follow pressures.  Follow metabolic panel.        Relevant Orders   Basic metabolic panel   Obesity (BMI 30-39.9)    Diet and exercise.       Sebaceous cyst    Lesion appears to be c/w sebaceous cyst.  Small.  No evidence of infection.  No pain.  Follow.        Other Visit Diagnoses    Breast cancer screening    -  Primary    Relevant Orders    MM DIGITAL SCREENING BILATERAL      I spent 25 minutes with the patient and more than 50% of the time was spent in consultation regarding the above.     Einar Pheasant, MD

## 2014-09-13 ENCOUNTER — Encounter: Payer: Self-pay | Admitting: Internal Medicine

## 2014-09-13 DIAGNOSIS — L723 Sebaceous cyst: Secondary | ICD-10-CM | POA: Insufficient documentation

## 2014-09-13 NOTE — Assessment & Plan Note (Signed)
Low cholesterol diet and exercise.  Follow lipid panel.   

## 2014-09-13 NOTE — Assessment & Plan Note (Signed)
Physical 05/05/14.  PAP 05/05/14 negative with negative HPV.  Missed mammogram for 2015.  Schedule for mammogram.  Colonoscopy 11/2010.

## 2014-09-13 NOTE — Assessment & Plan Note (Signed)
Blood pressure under good control.  Continue same medication regimen.  Follow pressures.  Follow metabolic panel.   

## 2014-09-13 NOTE — Assessment & Plan Note (Signed)
Low carb diet and exercise.  Follow met b and a1c.  

## 2014-09-13 NOTE — Assessment & Plan Note (Signed)
Diet and exercise.   

## 2014-09-13 NOTE — Assessment & Plan Note (Signed)
Presumed to be fatty liver.  Discussed diet and exercise.  Discussed the need for weight loss.

## 2014-09-13 NOTE — Assessment & Plan Note (Signed)
Saw Dr Bernardo Heater.  Follow urinalysis.  Urinalysis 08/21/13 - no rbc's.  Recheck with next labs.

## 2014-09-13 NOTE — Assessment & Plan Note (Signed)
Lesion appears to be c/w sebaceous cyst.  Small.  No evidence of infection.  No pain.  Follow.

## 2014-10-07 ENCOUNTER — Other Ambulatory Visit (INDEPENDENT_AMBULATORY_CARE_PROVIDER_SITE_OTHER): Payer: BLUE CROSS/BLUE SHIELD

## 2014-10-07 ENCOUNTER — Encounter: Payer: Self-pay | Admitting: Internal Medicine

## 2014-10-07 DIAGNOSIS — R319 Hematuria, unspecified: Secondary | ICD-10-CM | POA: Diagnosis not present

## 2014-10-07 DIAGNOSIS — I10 Essential (primary) hypertension: Secondary | ICD-10-CM | POA: Diagnosis not present

## 2014-10-07 DIAGNOSIS — K7689 Other specified diseases of liver: Secondary | ICD-10-CM

## 2014-10-07 DIAGNOSIS — E7800 Pure hypercholesterolemia, unspecified: Secondary | ICD-10-CM

## 2014-10-07 DIAGNOSIS — R739 Hyperglycemia, unspecified: Secondary | ICD-10-CM | POA: Diagnosis not present

## 2014-10-07 DIAGNOSIS — R945 Abnormal results of liver function studies: Secondary | ICD-10-CM

## 2014-10-07 LAB — HEPATIC FUNCTION PANEL
ALT: 52 U/L — AB (ref 0–35)
AST: 34 U/L (ref 0–37)
Albumin: 4.2 g/dL (ref 3.5–5.2)
Alkaline Phosphatase: 63 U/L (ref 39–117)
BILIRUBIN DIRECT: 0.1 mg/dL (ref 0.0–0.3)
BILIRUBIN TOTAL: 0.5 mg/dL (ref 0.2–1.2)
Total Protein: 7.4 g/dL (ref 6.0–8.3)

## 2014-10-07 LAB — URINALYSIS, ROUTINE W REFLEX MICROSCOPIC
Bilirubin Urine: NEGATIVE
KETONES UR: NEGATIVE
LEUKOCYTES UA: NEGATIVE
NITRITE: NEGATIVE
Specific Gravity, Urine: 1.02 (ref 1.000–1.030)
Total Protein, Urine: NEGATIVE
URINE GLUCOSE: NEGATIVE
Urobilinogen, UA: 0.2 (ref 0.0–1.0)
pH: 6 (ref 5.0–8.0)

## 2014-10-07 LAB — LIPID PANEL
CHOL/HDL RATIO: 4
CHOLESTEROL: 191 mg/dL (ref 0–200)
HDL: 51.3 mg/dL (ref >39.00)
LDL CALC: 120 mg/dL — AB (ref 0–99)
NONHDL: 139.98
Triglycerides: 98 mg/dL (ref 0.0–149.0)
VLDL: 19.6 mg/dL (ref 0.0–40.0)

## 2014-10-07 LAB — BASIC METABOLIC PANEL
BUN: 14 mg/dL (ref 6–23)
CO2: 29 meq/L (ref 19–32)
Calcium: 9.5 mg/dL (ref 8.4–10.5)
Chloride: 102 mEq/L (ref 96–112)
Creatinine, Ser: 0.68 mg/dL (ref 0.40–1.20)
GFR: 98.51 mL/min (ref >60.00)
Glucose, Bld: 110 mg/dL — ABNORMAL HIGH (ref 70–99)
POTASSIUM: 4 meq/L (ref 3.5–5.1)
SODIUM: 137 meq/L (ref 135–145)

## 2014-10-07 LAB — HEMOGLOBIN A1C: Hgb A1c MFr Bld: 6.1 % (ref 4.6–6.5)

## 2014-10-08 ENCOUNTER — Ambulatory Visit
Admission: RE | Admit: 2014-10-08 | Discharge: 2014-10-08 | Disposition: A | Discharge status: Home / Self Care | Payer: BLUE CROSS/BLUE SHIELD | Source: Ambulatory Visit | Attending: Internal Medicine | Admitting: Internal Medicine

## 2014-10-08 DIAGNOSIS — Z1231 Encounter for screening mammogram for malignant neoplasm of breast: Secondary | ICD-10-CM | POA: Diagnosis not present

## 2014-10-08 DIAGNOSIS — Z1239 Encounter for other screening for malignant neoplasm of breast: Secondary | ICD-10-CM

## 2014-10-08 MED ORDER — HYDROCHLOROTHIAZIDE 25 MG PO TABS: 25.0000 mg | ORAL_TABLET | Freq: Every day | ORAL | Status: DC

## 2014-10-08 NOTE — Telephone Encounter (Signed)
rx sent in for hctz #90 with 3 refills.

## 2014-11-21 ENCOUNTER — Encounter: Payer: Self-pay | Admitting: Internal Medicine

## 2014-11-21 DIAGNOSIS — I10 Essential (primary) hypertension: Secondary | ICD-10-CM

## 2014-11-21 DIAGNOSIS — R739 Hyperglycemia, unspecified: Secondary | ICD-10-CM

## 2014-11-21 DIAGNOSIS — R945 Abnormal results of liver function studies: Secondary | ICD-10-CM

## 2014-11-21 DIAGNOSIS — E7800 Pure hypercholesterolemia, unspecified: Secondary | ICD-10-CM

## 2014-11-21 DIAGNOSIS — R7989 Other specified abnormal findings of blood chemistry: Secondary | ICD-10-CM

## 2014-11-24 NOTE — Telephone Encounter (Signed)
Orders placed for labs.  Please schedule and notify pt of lab appt.

## 2014-11-25 ENCOUNTER — Other Ambulatory Visit: Payer: Self-pay | Admitting: Internal Medicine

## 2014-12-09 ENCOUNTER — Other Ambulatory Visit: Payer: BLUE CROSS/BLUE SHIELD

## 2014-12-12 ENCOUNTER — Other Ambulatory Visit (INDEPENDENT_AMBULATORY_CARE_PROVIDER_SITE_OTHER): Payer: BLUE CROSS/BLUE SHIELD

## 2014-12-12 DIAGNOSIS — I10 Essential (primary) hypertension: Secondary | ICD-10-CM

## 2014-12-12 DIAGNOSIS — R739 Hyperglycemia, unspecified: Secondary | ICD-10-CM

## 2014-12-12 DIAGNOSIS — R7989 Other specified abnormal findings of blood chemistry: Secondary | ICD-10-CM

## 2014-12-12 DIAGNOSIS — R945 Abnormal results of liver function studies: Secondary | ICD-10-CM

## 2014-12-12 DIAGNOSIS — E7800 Pure hypercholesterolemia, unspecified: Secondary | ICD-10-CM | POA: Diagnosis not present

## 2014-12-12 LAB — CBC WITH DIFFERENTIAL/PLATELET
BASOS ABS: 0 10*3/uL (ref 0.0–0.1)
BASOS PCT: 0.6 % (ref 0.0–3.0)
EOS ABS: 0.1 10*3/uL (ref 0.0–0.7)
Eosinophils Relative: 1.2 % (ref 0.0–5.0)
HCT: 44.4 % (ref 36.0–46.0)
Hemoglobin: 15 g/dL (ref 12.0–15.0)
Lymphocytes Relative: 33.7 % (ref 12.0–46.0)
Lymphs Abs: 2.8 10*3/uL (ref 0.7–4.0)
MCHC: 33.7 g/dL (ref 30.0–36.0)
MCV: 90.9 fl (ref 78.0–100.0)
Monocytes Absolute: 0.6 10*3/uL (ref 0.1–1.0)
Monocytes Relative: 7.4 % (ref 3.0–12.0)
NEUTROS ABS: 4.7 10*3/uL (ref 1.4–7.7)
NEUTROS PCT: 57.1 % (ref 43.0–77.0)
PLATELETS: 251 10*3/uL (ref 150.0–400.0)
RBC: 4.88 Mil/uL (ref 3.87–5.11)
RDW: 13.6 % (ref 11.5–15.5)
WBC: 8.2 10*3/uL (ref 4.0–10.5)

## 2014-12-12 LAB — BASIC METABOLIC PANEL
BUN: 12 mg/dL (ref 6–23)
CHLORIDE: 100 meq/L (ref 96–112)
CO2: 29 mEq/L (ref 19–32)
CREATININE: 0.62 mg/dL (ref 0.40–1.20)
Calcium: 9.9 mg/dL (ref 8.4–10.5)
GFR: 109.5 mL/min (ref >60.00)
GLUCOSE: 74 mg/dL (ref 70–99)
POTASSIUM: 3.9 meq/L (ref 3.5–5.1)
Sodium: 139 mEq/L (ref 135–145)

## 2014-12-12 LAB — LIPID PANEL
Cholesterol: 162 mg/dL (ref 0–200)
HDL: 49.1 mg/dL (ref >39.00)
LDL Cholesterol: 97 mg/dL (ref 0–99)
NONHDL: 112.88
TRIGLYCERIDES: 79 mg/dL (ref 0.0–149.0)
Total CHOL/HDL Ratio: 3
VLDL: 15.8 mg/dL (ref 0.0–40.0)

## 2014-12-12 LAB — HEPATIC FUNCTION PANEL
ALK PHOS: 65 U/L (ref 39–117)
ALT: 53 U/L — ABNORMAL HIGH (ref 0–35)
AST: 31 U/L (ref 0–37)
Albumin: 4.5 g/dL (ref 3.5–5.2)
BILIRUBIN DIRECT: 0.1 mg/dL (ref 0.0–0.3)
TOTAL PROTEIN: 7.5 g/dL (ref 6.0–8.3)
Total Bilirubin: 0.6 mg/dL (ref 0.2–1.2)

## 2014-12-12 LAB — T4, FREE: FREE T4: 0.79 ng/dL (ref 0.60–1.60)

## 2014-12-12 LAB — HEMOGLOBIN A1C: Hgb A1c MFr Bld: 5.6 % (ref 4.6–6.5)

## 2014-12-12 LAB — TSH: TSH: 2.59 u[IU]/mL (ref 0.35–4.50)

## 2014-12-12 LAB — FOLLICLE STIMULATING HORMONE: FSH: 4.7 m[IU]/mL

## 2014-12-12 LAB — T3, FREE: T3, Free: 3.6 pg/mL (ref 2.3–4.2)

## 2014-12-15 ENCOUNTER — Encounter: Payer: Self-pay | Admitting: Internal Medicine

## 2014-12-17 NOTE — Telephone Encounter (Signed)
Unread mychart message mailed to patient 

## 2015-01-12 ENCOUNTER — Encounter: Payer: Self-pay | Admitting: Internal Medicine

## 2015-01-12 ENCOUNTER — Telehealth: Payer: Self-pay | Admitting: Internal Medicine

## 2015-01-12 ENCOUNTER — Ambulatory Visit (INDEPENDENT_AMBULATORY_CARE_PROVIDER_SITE_OTHER): Payer: BLUE CROSS/BLUE SHIELD | Admitting: Internal Medicine

## 2015-01-12 VITALS — BP 120/70 | HR 73 | Temp 98.0°F | Resp 18 | Ht 65.5 in | Wt 228.0 lb

## 2015-01-12 DIAGNOSIS — K7689 Other specified diseases of liver: Secondary | ICD-10-CM | POA: Diagnosis not present

## 2015-01-12 DIAGNOSIS — R739 Hyperglycemia, unspecified: Secondary | ICD-10-CM

## 2015-01-12 DIAGNOSIS — I10 Essential (primary) hypertension: Secondary | ICD-10-CM

## 2015-01-12 DIAGNOSIS — R198 Other specified symptoms and signs involving the digestive system and abdomen: Secondary | ICD-10-CM | POA: Diagnosis not present

## 2015-01-12 DIAGNOSIS — E7800 Pure hypercholesterolemia, unspecified: Secondary | ICD-10-CM

## 2015-01-12 DIAGNOSIS — R945 Abnormal results of liver function studies: Secondary | ICD-10-CM

## 2015-01-12 DIAGNOSIS — E669 Obesity, unspecified: Secondary | ICD-10-CM

## 2015-01-12 NOTE — Progress Notes (Signed)
Pre-visit discussion using our clinic review tool. No additional management support is needed unless otherwise documented below in the visit note.  

## 2015-01-12 NOTE — Telephone Encounter (Signed)
I placed the order for the ultrasound.  Please schedule.  Thanks

## 2015-01-12 NOTE — Telephone Encounter (Signed)
Pt called to request liver ultrasound be around 3:30 by the end of the month.msn

## 2015-01-12 NOTE — Progress Notes (Signed)
Patient ID: Joy Houston, female   DOB: [birth year], 49 y.o.   MRN: [id number]   Subjective:    Patient ID: Joy Houston, female    DOB: [birth year], 49 y.o.   MRN: [id number]  HPI  Patient with history of hypertension, hyperglycemia and fatty liver.  She comes in today for a scheduled follow up.  She has adjusted her diet and lost weight.  Doing weight watchers now.   Feels better.  Has not started exercising.  Plans to start.  Cholesterol recently checked and improved.  One liver test slightly elevated.  Remainder of liver panel wnl.  No abdominal pain or cramping.  States has had a little straining with bowel movements recently.  Minimal bleeding this am.  Feels related to hemorrhoids.     Past Medical History  Diagnosis Date  . Complication of anesthesia   . PONV (postoperative nausea and vomiting)   . Family history of anesthesia complication     Mother - confusion  . Hypertension   . Heart murmur     Slight - "nothing to worry about"  . Hemorrhoid   . Eczema   . Fatty liver    Past Surgical History  Procedure Laterality Date  . Cesarean section    . Finger surgery      pin to 3rd finger Right hand  . Wisdom tooth extraction    . Lumbar laminectomy/decompression microdiscectomy  12/15/2011    Procedure: LUMBAR LAMINECTOMY/DECOMPRESSION MICRODISCECTOMY 1 LEVEL;  Surgeon: Ophelia Charter, MD;  Location: [facility];  Service: Neurosurgery;  Laterality: Right;  RIGHT Lumbar four-Five diskectomy   Family History  Problem Relation Age of Onset  . Diabetes Father   . Cancer Father     Melanoma, Prostate  . Heart disease Father   . Heart disease Maternal Grandfather   . Diabetes Paternal Grandfather   . GER disease Mother    Social History   Social History  . Marital Status: Married    Spouse Name: N/A  . Number of Children: 2  . Years of Education: N/A   Occupational History  .     Social History Main Topics  . Smoking status: Never Smoker   . Smokeless  tobacco: Never Used  . Alcohol Use: 0.0 oz/week    0 Standard drinks or equivalent per week     Comment: rare  . Drug Use: No  . Sexual Activity: Not Asked   Other Topics Concern  . None   Social History Narrative    Outpatient Encounter Prescriptions as of 01/12/2015  Medication Sig  . hydrochlorothiazide (HYDRODIURIL) 25 MG tablet Take 1 tablet (25 mg total) by mouth daily.  . hydrocortisone (ANUSOL-HC) 25 MG suppository Place 25 mg rectally 2 (two) times daily as needed.  Marland Kitchen ibuprofen (ADVIL,MOTRIN) 800 MG tablet Take 1 tablet (800 mg total) by mouth every 8 (eight) hours as needed for pain.  Marland Kitchen lisinopril (PRINIVIL,ZESTRIL) 10 MG tablet TAKE 1 TABLET BY MOUTH EVERY DAY  . [DISCONTINUED] doxycycline (VIBRAMYCIN) 100 MG capsule Take 100 mg by mouth 2 (two) times daily.  . [DISCONTINUED] tobramycin (TOBREX) 0.3 % ophthalmic solution ADMINISTER 1-2 DROPS TO THE RIGHT EYE EVERY 4 (FOUR) HOURS FOR 7 DAYS. AVOID IN LACTATION.   No facility-administered encounter medications on file as of 01/12/2015.    Review of Systems  Constitutional:       Adjusted her diet.  Has lost weight.  Feels better.   HENT: Negative for congestion and sinus  pressure.   Eyes: Negative for pain and discharge.  Respiratory: Negative for cough, chest tightness and shortness of breath.   Cardiovascular: Negative for chest pain, palpitations and leg swelling.  Gastrointestinal: Negative for nausea, vomiting, abdominal pain and diarrhea.  Genitourinary: Negative for dysuria and difficulty urinating.  Musculoskeletal: Negative for back pain and joint swelling.  Skin: Negative for color change and rash.  Neurological: Negative for dizziness, light-headedness and headaches.  Psychiatric/Behavioral: Negative for dysphoric mood and agitation.       Objective:     Blood pressure rechecked by me:  120/78  Physical Exam  Constitutional: She appears well-developed and well-nourished. No distress.  HENT:  Nose:  Nose normal.  Mouth/Throat: Oropharynx is clear and moist.  Eyes: Conjunctivae are normal. Right eye exhibits no discharge. Left eye exhibits no discharge.  Neck: Neck supple. No thyromegaly present.  Cardiovascular: Normal rate and regular rhythm.   Pulmonary/Chest: Breath sounds normal. No respiratory distress. She has no wheezes.  Abdominal: Soft. Bowel sounds are normal. There is no tenderness.  Minimal fullness in the RUQ.    Musculoskeletal: She exhibits no edema or tenderness.  Lymphadenopathy:    She has no cervical adenopathy.  Skin: No rash noted. No erythema.  Psychiatric: She has a normal mood and affect. Her behavior is normal.    BP 120/70 mmHg  Pulse 73  Temp(Src) 98 F (36.7 C) (Oral)  Resp 18  Ht 5' 5.5" (1.664 m)  Wt 228 lb (103.42 kg)  BMI 37.35 kg/m2  SpO2 95% Wt Readings from Last 3 Encounters:  01/12/15 228 lb (103.42 kg)  09/11/14 252 lb (114.306 kg)  05/05/14 252 lb 8 oz (114.533 kg)     Lab Results  Component Value Date   WBC 8.2 12/12/2014   HGB 15.0 12/12/2014   HCT 44.4 12/12/2014   PLT 251.0 12/12/2014   GLUCOSE 74 12/12/2014   CHOL 162 12/12/2014   TRIG 79.0 12/12/2014   HDL 49.10 12/12/2014   LDLDIRECT 145.4 03/01/2013   LDLCALC 97 12/12/2014   ALT 53* 12/12/2014   AST 31 12/12/2014   NA 139 12/12/2014   K 3.9 12/12/2014   CL 100 12/12/2014   CREATININE 0.62 12/12/2014   BUN 12 12/12/2014   CO2 29 12/12/2014   TSH 2.59 12/12/2014   HGBA1C 5.6 12/12/2014   MICROALBUR 0.9 05/06/2014    Mm Digital Screening Bilateral  10/09/2014  CLINICAL DATA:  Screening. EXAM: DIGITAL SCREENING BILATERAL MAMMOGRAM WITH CAD COMPARISON:  Previous exam(s). ACR Breast Density Category b: There are scattered areas of fibroglandular density. FINDINGS: There are no findings suspicious for malignancy. Images were processed with CAD. IMPRESSION: No mammographic evidence of malignancy. A result letter of this screening mammogram will be mailed directly to  the patient. RECOMMENDATION: Screening mammogram in one year. (Code:SM-B-01Y) BI-RADS CATEGORY  1: Negative. Electronically Signed   By: Abelardo Diesel M.D.   On: 10/09/2014 09:23       Assessment & Plan:   Problem List Items Addressed This Visit    Abdominal fullness in right upper quadrant    Check abdominal ultrasound as outlined.        Abnormal liver function - Primary    Liver function tests as outlined.   Has adjusted her diet and lost weight.  Presumed to be due to fatty liver.  Abdominal fullness as outlined.  Check abdominal ultrasound.        Relevant Orders   US Abdomen Complete   Hepatic function panel  Hypercholesterolemia    Low cholesterol diet and exercise.  Follow lipid panel.  Last liver panel wnl.   Lab Results  Component Value Date   CHOL 162 12/12/2014   HDL 49.10 12/12/2014   LDLCALC 97 12/12/2014   LDLDIRECT 145.4 03/01/2013   TRIG 79.0 12/12/2014   CHOLHDL 3 12/12/2014        Relevant Orders   Lipid panel   Hyperglycemia    Low carb diet and exercise.  Follow met b and a1c.   Lab Results  Component Value Date   HGBA1C 5.6 12/12/2014        Relevant Orders   Hemoglobin A1c   Hypertension    Blood pressure under good control.  Continue same medication regimen.  Follow pressures.  Follow metabolic panel.        Relevant Orders   Basic metabolic panel   Obesity (BMI 30-39.9)    She has adjusted her diet.  Low weight.  Feels better.  Plans to start exercising.         Other Visit Diagnoses    RUQ fullness        Relevant Orders    US Abdomen Complete        Einar Pheasant, MD

## 2015-01-13 ENCOUNTER — Encounter: Payer: Self-pay | Admitting: Internal Medicine

## 2015-01-13 DIAGNOSIS — R198 Other specified symptoms and signs involving the digestive system and abdomen: Secondary | ICD-10-CM | POA: Insufficient documentation

## 2015-01-13 NOTE — Assessment & Plan Note (Signed)
Liver function tests as outlined.   Has adjusted her diet and lost weight.  Presumed to be due to fatty liver.  Abdominal fullness as outlined.  Check abdominal ultrasound.

## 2015-01-13 NOTE — Assessment & Plan Note (Signed)
Check abdominal ultrasound as outlined.

## 2015-01-13 NOTE — Assessment & Plan Note (Signed)
Low carb diet and exercise.  Follow met b and a1c.   Lab Results  Component Value Date   HGBA1C 5.6 12/12/2014

## 2015-01-13 NOTE — Assessment & Plan Note (Signed)
Blood pressure under good control.  Continue same medication regimen.  Follow pressures.  Follow metabolic panel.   

## 2015-01-13 NOTE — Assessment & Plan Note (Signed)
She has adjusted her diet.  Low weight.  Feels better.  Plans to start exercising.

## 2015-01-13 NOTE — Assessment & Plan Note (Signed)
Low cholesterol diet and exercise.  Follow lipid panel.  Last liver panel wnl.   Lab Results  Component Value Date   CHOL 162 12/12/2014   HDL 49.10 12/12/2014   LDLCALC 97 12/12/2014   LDLDIRECT 145.4 03/01/2013   TRIG 79.0 12/12/2014   CHOLHDL 3 12/12/2014

## 2015-01-13 NOTE — Telephone Encounter (Signed)
Please clarify time with pt.  Thanks.

## 2015-01-21 ENCOUNTER — Ambulatory Visit
Admission: RE | Admit: 2015-01-21 | Discharge: 2015-01-21 | Disposition: A | Discharge status: Home / Self Care | Payer: BLUE CROSS/BLUE SHIELD | Source: Ambulatory Visit | Attending: Internal Medicine | Admitting: Internal Medicine

## 2015-01-21 DIAGNOSIS — R198 Other specified symptoms and signs involving the digestive system and abdomen: Secondary | ICD-10-CM | POA: Diagnosis present

## 2015-01-21 DIAGNOSIS — K8020 Calculus of gallbladder without cholecystitis without obstruction: Secondary | ICD-10-CM | POA: Insufficient documentation

## 2015-01-21 DIAGNOSIS — R748 Abnormal levels of other serum enzymes: Secondary | ICD-10-CM | POA: Diagnosis present

## 2015-01-21 DIAGNOSIS — R945 Abnormal results of liver function studies: Secondary | ICD-10-CM

## 2015-01-22 ENCOUNTER — Encounter: Payer: Self-pay | Admitting: Internal Medicine

## 2015-01-22 DIAGNOSIS — R945 Abnormal results of liver function studies: Secondary | ICD-10-CM

## 2015-01-22 DIAGNOSIS — R7989 Other specified abnormal findings of blood chemistry: Secondary | ICD-10-CM

## 2015-01-22 NOTE — Telephone Encounter (Signed)
Order placed for GI referral.   

## 2015-03-06 ENCOUNTER — Other Ambulatory Visit: Payer: Self-pay | Admitting: Physician Assistant

## 2015-03-06 MED ORDER — DIFLUPREDNATE 0.05 % OP EMUL: 1.0000 [drp] | Freq: Four times a day (QID) | OPHTHALMIC | Status: DC | PRN

## 2015-03-06 NOTE — Progress Notes (Signed)
Pt called and needs refill on eye medication that opth gave her, durezol

## 2015-04-03 ENCOUNTER — Other Ambulatory Visit: Payer: BLUE CROSS/BLUE SHIELD

## 2015-04-09 ENCOUNTER — Other Ambulatory Visit: Payer: BLUE CROSS/BLUE SHIELD

## 2015-04-14 ENCOUNTER — Other Ambulatory Visit (INDEPENDENT_AMBULATORY_CARE_PROVIDER_SITE_OTHER): Payer: BLUE CROSS/BLUE SHIELD

## 2015-04-14 DIAGNOSIS — E7800 Pure hypercholesterolemia, unspecified: Secondary | ICD-10-CM

## 2015-04-14 DIAGNOSIS — R739 Hyperglycemia, unspecified: Secondary | ICD-10-CM | POA: Diagnosis not present

## 2015-04-14 DIAGNOSIS — I10 Essential (primary) hypertension: Secondary | ICD-10-CM | POA: Diagnosis not present

## 2015-04-14 DIAGNOSIS — R945 Abnormal results of liver function studies: Secondary | ICD-10-CM

## 2015-04-14 DIAGNOSIS — K7689 Other specified diseases of liver: Secondary | ICD-10-CM

## 2015-04-14 LAB — BASIC METABOLIC PANEL
BUN: 16 mg/dL (ref 6–23)
CO2: 30 mEq/L (ref 19–32)
Calcium: 9.8 mg/dL (ref 8.4–10.5)
Chloride: 99 mEq/L (ref 96–112)
Creatinine, Ser: 0.63 mg/dL (ref 0.40–1.20)
GFR: 107.34 mL/min (ref >60.00)
Glucose, Bld: 78 mg/dL (ref 70–99)
POTASSIUM: 3.6 meq/L (ref 3.5–5.1)
Sodium: 138 mEq/L (ref 135–145)

## 2015-04-14 LAB — HEPATIC FUNCTION PANEL
ALBUMIN: 4.6 g/dL (ref 3.5–5.2)
ALK PHOS: 70 U/L (ref 39–117)
ALT: 17 U/L (ref 0–35)
AST: 16 U/L (ref 0–37)
Bilirubin, Direct: 0.1 mg/dL (ref 0.0–0.3)
Total Bilirubin: 0.5 mg/dL (ref 0.2–1.2)
Total Protein: 7.5 g/dL (ref 6.0–8.3)

## 2015-04-14 LAB — LIPID PANEL
Cholesterol: 189 mg/dL (ref 0–200)
HDL: 57.8 mg/dL (ref >39.00)
LDL Cholesterol: 115 mg/dL — ABNORMAL HIGH (ref 0–99)
NONHDL: 130.76
TRIGLYCERIDES: 78 mg/dL (ref 0.0–149.0)
Total CHOL/HDL Ratio: 3
VLDL: 15.6 mg/dL (ref 0.0–40.0)

## 2015-04-14 LAB — HEMOGLOBIN A1C: Hgb A1c MFr Bld: 5.4 % (ref 4.6–6.5)

## 2015-04-16 ENCOUNTER — Ambulatory Visit: Payer: BLUE CROSS/BLUE SHIELD | Admitting: Internal Medicine

## 2015-04-16 ENCOUNTER — Encounter: Payer: Self-pay | Admitting: Internal Medicine

## 2015-04-21 ENCOUNTER — Encounter: Payer: Self-pay | Admitting: Internal Medicine

## 2015-04-21 ENCOUNTER — Ambulatory Visit (INDEPENDENT_AMBULATORY_CARE_PROVIDER_SITE_OTHER): Payer: BLUE CROSS/BLUE SHIELD | Admitting: Internal Medicine

## 2015-04-21 VITALS — BP 110/70 | HR 78 | Temp 98.5°F | Resp 18 | Ht 65.5 in | Wt 214.5 lb

## 2015-04-21 DIAGNOSIS — R945 Abnormal results of liver function studies: Secondary | ICD-10-CM

## 2015-04-21 DIAGNOSIS — R739 Hyperglycemia, unspecified: Secondary | ICD-10-CM | POA: Diagnosis not present

## 2015-04-21 DIAGNOSIS — E7800 Pure hypercholesterolemia, unspecified: Secondary | ICD-10-CM

## 2015-04-21 DIAGNOSIS — E669 Obesity, unspecified: Secondary | ICD-10-CM | POA: Diagnosis not present

## 2015-04-21 DIAGNOSIS — I10 Essential (primary) hypertension: Secondary | ICD-10-CM | POA: Diagnosis not present

## 2015-04-21 DIAGNOSIS — R198 Other specified symptoms and signs involving the digestive system and abdomen: Secondary | ICD-10-CM

## 2015-04-21 DIAGNOSIS — K7689 Other specified diseases of liver: Secondary | ICD-10-CM

## 2015-04-21 NOTE — Progress Notes (Signed)
Pre-visit discussion using our clinic review tool. No additional management support is needed unless otherwise documented below in the visit note.  

## 2015-04-21 NOTE — Progress Notes (Signed)
Patient ID: Joy Houston, female   DOB: [birth year], 50 y.o.   MRN: [id number]   Subjective:    Patient ID: Joy Houston, female    DOB: 1965/09/22, 50 y.o.   MRN: [id number]  HPI  Patient with past history of abnormal liver function tests, hyperglycemia, hypercholesterolemia and hypertension.  She comes in today for a scheduled follow up.  She has adjusted her diet.  Has lost weight.  Has done well with her diet.  Feels better.  Labs better. Discussed lab results.  Discussed exercise.  She plans to start exercising.  No abdominal pain or cramping.  Bowels stable.  No chest pain or tightness.  No sob.     Past Medical History  Diagnosis Date  . Complication of anesthesia   . PONV (postoperative nausea and vomiting)   . Family history of anesthesia complication     Mother - confusion  . Hypertension   . Heart murmur     Slight - "nothing to worry about"  . Hemorrhoid   . Eczema   . Fatty liver    Past Surgical History  Procedure Laterality Date  . Cesarean section    . Finger surgery      pin to 3rd finger Right hand  . Wisdom tooth extraction    . Lumbar laminectomy/decompression microdiscectomy  12/15/2011    Procedure: LUMBAR LAMINECTOMY/DECOMPRESSION MICRODISCECTOMY 1 LEVEL;  Surgeon: Ophelia Charter, MD;  Location: [facility];  Service: Neurosurgery;  Laterality: Right;  RIGHT Lumbar four-Five diskectomy   Family History  Problem Relation Age of Onset  . Diabetes Father   . Cancer Father     Melanoma, Prostate  . Heart disease Father   . Heart disease Maternal Grandfather   . Diabetes Paternal Grandfather   . GER disease Mother    Social History   Social History  . Marital Status: Married    Spouse Name: N/A  . Number of Children: 2  . Years of Education: N/A   Occupational History  .     Social History Main Topics  . Smoking status: Never Smoker   . Smokeless tobacco: Never Used  . Alcohol Use: 0.0 oz/week    0 Standard drinks or equivalent per  week     Comment: rare  . Drug Use: No  . Sexual Activity: Not Asked   Other Topics Concern  . None   Social History Narrative    Outpatient Encounter Prescriptions as of 04/21/2015  Medication Sig  . Difluprednate 0.05 % EMUL Apply 1 drop to eye 4 (four) times daily as needed.  . hydrochlorothiazide (HYDRODIURIL) 25 MG tablet Take 1 tablet (25 mg total) by mouth daily.  . hydrocortisone (ANUSOL-HC) 25 MG suppository Place 25 mg rectally 2 (two) times daily as needed.  Marland Kitchen ibuprofen (ADVIL,MOTRIN) 800 MG tablet Take 1 tablet (800 mg total) by mouth every 8 (eight) hours as needed for pain.  Marland Kitchen lisinopril (PRINIVIL,ZESTRIL) 10 MG tablet TAKE 1 TABLET BY MOUTH EVERY DAY   No facility-administered encounter medications on file as of 04/21/2015.    Review of Systems  Constitutional: Negative for appetite change and unexpected weight change.  HENT: Negative for congestion and sinus pressure.   Respiratory: Negative for cough, chest tightness and shortness of breath.   Cardiovascular: Negative for chest pain, palpitations and leg swelling.  Gastrointestinal: Negative for nausea, vomiting, abdominal pain and diarrhea.  Genitourinary: Negative for dysuria and difficulty urinating.  Musculoskeletal: Negative for back pain and joint  swelling.  Skin: Negative for color change and rash.  Neurological: Negative for dizziness, light-headedness and headaches.  Psychiatric/Behavioral: Negative for dysphoric mood and agitation.       Objective:     Blood pressure rechecked by me:  112/78  Physical Exam  Constitutional: She appears well-developed and well-nourished. No distress.  HENT:  Nose: Nose normal.  Mouth/Throat: Oropharynx is clear and moist.  Eyes: Conjunctivae are normal. Right eye exhibits no discharge. Left eye exhibits no discharge.  Neck: Neck supple. No thyromegaly present.  Cardiovascular: Normal rate and regular rhythm.   Pulmonary/Chest: Breath sounds normal. No respiratory  distress. She has no wheezes.  Abdominal: Soft. Bowel sounds are normal. There is no tenderness.  Musculoskeletal: She exhibits no edema or tenderness.  Lymphadenopathy:    She has no cervical adenopathy.  Skin: No rash noted. No erythema.  Psychiatric: She has a normal mood and affect. Her behavior is normal.    BP 110/70 mmHg  Pulse 78  Temp(Src) 98.5 F (36.9 C) (Oral)  Resp 18  Ht 5' 5.5" (1.664 m)  Wt 214 lb 8 oz (97.297 kg)  BMI 35.14 kg/m2  SpO2 97% Wt Readings from Last 3 Encounters:  04/21/15 214 lb 8 oz (97.297 kg)  01/12/15 228 lb (103.42 kg)  09/11/14 252 lb (114.306 kg)     Lab Results  Component Value Date   WBC 8.2 12/12/2014   HGB 15.0 12/12/2014   HCT 44.4 12/12/2014   PLT 251.0 12/12/2014   GLUCOSE 78 04/14/2015   CHOL 189 04/14/2015   TRIG 78.0 04/14/2015   HDL 57.80 04/14/2015   LDLDIRECT 145.4 03/01/2013   LDLCALC 115* 04/14/2015   ALT 17 04/14/2015   AST 16 04/14/2015   NA 138 04/14/2015   K 3.6 04/14/2015   CL 99 04/14/2015   CREATININE 0.63 04/14/2015   BUN 16 04/14/2015   CO2 30 04/14/2015   TSH 2.59 12/12/2014   HGBA1C 5.4 04/14/2015   MICROALBUR 0.9 05/06/2014    US Abdomen Complete  01/21/2015  CLINICAL DATA:  Abnormal liver function test ; sensation of right upper quadrant fullness. EXAM: ULTRASOUND ABDOMEN COMPLETE COMPARISON:  Abdominal ultrasound of October 31, 2006 FINDINGS: Gallbladder: The gallstone is adequately distended. There are mobile gallstones with the largest measuring 8 mm in diameter. A non mobile focus measures 5 x 7 mm. There is no gallbladder wall thickening, pericholecystic fluid, or positive sonographic Murphy's sign. Common bile duct: Diameter: 3.1 mm Liver: The hepatic echotexture is increased diffusely. There is no focal mass or ductal dilation. A small area of decreased echogenicity adjacent to the gallbladder likely reflect focal fatty sparing. IVC: No abnormality visualized. Pancreas: Visualized portion  unremarkable. Spleen: Size and appearance within normal limits. Right Kidney: Length: 12 cm. Echogenicity within normal limits. No mass or hydronephrosis visualized. Left Kidney: Length: 11.9 cm. Echogenicity within normal limits. No mass or hydronephrosis visualized. Abdominal aorta: No aneurysm visualized. Other findings: There is no ascites. IMPRESSION: 1. Multiple gallstones without sonographic evidence of acute cholecystitis. There may be a 7 mm non mobile gallstone or polyp. 2. Fatty infiltrative change of the liver which is been previously described. 3. No acute abnormality is observed elsewhere in the abdomen. Electronically Signed   By: David  Martinique M.D.   On: 01/21/2015 16:34       Assessment & Plan:   Problem List Items Addressed This Visit    Abdominal fullness in right upper quadrant    Abdominal ultrasound as outlined.  No  fullness noted on exam today.  Has lost weight.  Follow.        Abnormal liver function    With the weight loss, liver function tests now normal.  Follow.        Relevant Orders   Hepatic function panel   Hypercholesterolemia    Low cholesterol diet and exercise.  Follow lipid panel.   Lab Results  Component Value Date   CHOL 189 04/14/2015   HDL 57.80 04/14/2015   LDLCALC 115* 04/14/2015   LDLDIRECT 145.4 03/01/2013   TRIG 78.0 04/14/2015   CHOLHDL 3 04/14/2015        Relevant Orders   Lipid panel   Hyperglycemia    Low carb diet and exercise.  a1c just checked 5.4.        Relevant Orders   Hemoglobin A1c   Hypertension - Primary    Blood pressure doing well.  Has lost weight.  Decreased hctz to 25mg  1/2 tablet per day.  Follow pressures.  Will continue to adjust if needed.  Follow metabolic panel.        Relevant Orders   Basic metabolic panel   Obesity (BMI 30-39.9)    She has adjusted her diet.  Has lost weight.  Feels better. Continue diet adjustment.  Plans to start exercise.            Einar Pheasant, MD

## 2015-04-26 ENCOUNTER — Encounter: Payer: Self-pay | Admitting: Internal Medicine

## 2015-04-26 NOTE — Assessment & Plan Note (Signed)
With the weight loss, liver function tests now normal.  Follow.

## 2015-04-26 NOTE — Assessment & Plan Note (Signed)
She has adjusted her diet.  Has lost weight.  Feels better. Continue diet adjustment.  Plans to start exercise.

## 2015-04-26 NOTE — Assessment & Plan Note (Signed)
Abdominal ultrasound as outlined.  No fullness noted on exam today.  Has lost weight.  Follow.

## 2015-04-26 NOTE — Assessment & Plan Note (Signed)
Low carb diet and exercise.  a1c just checked 5.4.

## 2015-04-26 NOTE — Assessment & Plan Note (Signed)
Low cholesterol diet and exercise.  Follow lipid panel.   Lab Results  Component Value Date   CHOL 189 04/14/2015   HDL 57.80 04/14/2015   LDLCALC 115* 04/14/2015   LDLDIRECT 145.4 03/01/2013   TRIG 78.0 04/14/2015   CHOLHDL 3 04/14/2015

## 2015-04-26 NOTE — Assessment & Plan Note (Signed)
Blood pressure doing well.  Has lost weight.  Decreased hctz to 25mg  1/2 tablet per day.  Follow pressures.  Will continue to adjust if needed.  Follow metabolic panel.

## 2015-04-27 ENCOUNTER — Encounter: Payer: Self-pay | Admitting: Internal Medicine

## 2015-05-11 ENCOUNTER — Encounter: Payer: Self-pay | Admitting: Internal Medicine

## 2015-05-11 NOTE — Telephone Encounter (Signed)
I can see her tomorrow at 12:00.  I am unable to call in valium/pain medication without an evaluation.   Thanks.

## 2015-05-12 ENCOUNTER — Ambulatory Visit (INDEPENDENT_AMBULATORY_CARE_PROVIDER_SITE_OTHER): Payer: BLUE CROSS/BLUE SHIELD | Admitting: Internal Medicine

## 2015-05-12 ENCOUNTER — Encounter: Payer: Self-pay | Admitting: Internal Medicine

## 2015-05-12 VITALS — BP 120/72 | HR 71 | Temp 98.3°F | Resp 18 | Ht 65.5 in | Wt 218.1 lb

## 2015-05-12 DIAGNOSIS — M5126 Other intervertebral disc displacement, lumbar region: Secondary | ICD-10-CM

## 2015-05-12 MED ORDER — DIAZEPAM 5 MG PO TABS: 5.0000 mg | ORAL_TABLET | Freq: Every evening | ORAL | Status: DC | PRN

## 2015-05-12 MED ORDER — IBUPROFEN 800 MG PO TABS: 800.0000 mg | ORAL_TABLET | Freq: Three times a day (TID) | ORAL | Status: DC | PRN

## 2015-05-12 NOTE — Progress Notes (Signed)
Patient ID: Joy Houston, female   DOB: April 28, 1965, 50 y.o.   MRN: [id number]   Subjective:    Patient ID: Joy Houston, female    DOB: 01/18/1966, 50 y.o.   MRN: [id number]  HPI  Patient here as a work in with concerns regarding back pain.  Noticed at the end of last week, she was more stiff with sitting and standing.  Has progressively has gotten worse.  Localized to the lower back.  No radiation of pain.  Better with walking.  Has used healing touch.  Has had issues with her back previously.  S/p surgery.  Flares at times.  States what works for her is ibuprofen and valium.    Past Medical History  Diagnosis Date  . Complication of anesthesia   . PONV (postoperative nausea and vomiting)   . Family history of anesthesia complication     Mother - confusion  . Hypertension   . Heart murmur     Slight - "nothing to worry about"  . Hemorrhoid   . Eczema   . Fatty liver    Past Surgical History  Procedure Laterality Date  . Cesarean section    . Finger surgery      pin to 3rd finger Right hand  . Wisdom tooth extraction    . Lumbar laminectomy/decompression microdiscectomy  12/15/2011    Procedure: LUMBAR LAMINECTOMY/DECOMPRESSION MICRODISCECTOMY 1 LEVEL;  Surgeon: Ophelia Charter, MD;  Location: [facility];  Service: Neurosurgery;  Laterality: Right;  RIGHT Lumbar four-Five diskectomy   Family History  Problem Relation Age of Onset  . Diabetes Father   . Cancer Father     Melanoma, Prostate  . Heart disease Father   . Heart disease Maternal Grandfather   . Diabetes Paternal Grandfather   . GER disease Mother    Social History   Social History  . Marital Status: Married    Spouse Name: N/A  . Number of Children: 2  . Years of Education: N/A   Occupational History  .     Social History Main Topics  . Smoking status: Never Smoker   . Smokeless tobacco: Never Used  . Alcohol Use: 0.0 oz/week    0 Standard drinks or equivalent per week     Comment: rare    . Drug Use: No  . Sexual Activity: Not Asked   Other Topics Concern  . None   Social History Narrative    Outpatient Encounter Prescriptions as of 05/12/2015  Medication Sig  . Difluprednate 0.05 % EMUL Apply 1 drop to eye 4 (four) times daily as needed.  . hydrochlorothiazide (HYDRODIURIL) 25 MG tablet Take 1 tablet (25 mg total) by mouth daily.  . hydrocortisone (ANUSOL-HC) 25 MG suppository Place 25 mg rectally 2 (two) times daily as needed.  Marland Kitchen ibuprofen (ADVIL,MOTRIN) 800 MG tablet Take 1 tablet (800 mg total) by mouth every 8 (eight) hours as needed.  Marland Kitchen lisinopril (PRINIVIL,ZESTRIL) 10 MG tablet TAKE 1 TABLET BY MOUTH EVERY DAY  . [DISCONTINUED] ibuprofen (ADVIL,MOTRIN) 800 MG tablet Take 1 tablet (800 mg total) by mouth every 8 (eight) hours as needed for pain.  . diazepam (VALIUM) 5 MG tablet Take 1 tablet (5 mg total) by mouth at bedtime as needed for anxiety.   No facility-administered encounter medications on file as of 05/12/2015.    Review of Systems  Constitutional: Negative for fever and appetite change.  Respiratory: Negative for cough and shortness of breath.   Cardiovascular: Negative for  chest pain, palpitations and leg swelling.  Gastrointestinal: Negative for nausea and vomiting.  Genitourinary: Negative for dysuria and difficulty urinating.  Musculoskeletal: Positive for back pain. Negative for joint swelling.  Skin: Negative for color change and rash.  Psychiatric/Behavioral: Negative for dysphoric mood and agitation.       Objective:    Physical Exam  Constitutional: She appears well-developed and well-nourished. No distress.  Neck: Neck supple. No thyromegaly present.  Cardiovascular: Normal rate and regular rhythm.   Pulmonary/Chest: Breath sounds normal. No respiratory distress. She has no wheezes.  Abdominal: Bowel sounds are normal.  Musculoskeletal: She exhibits no edema or tenderness.  Increased pain with lying down and sitting up.  Increased  pain with straight leg raise.    Lymphadenopathy:    She has no cervical adenopathy.  Skin: No rash noted. No erythema.    BP 120/72 mmHg  Pulse 71  Temp(Src) 98.3 F (36.8 C) (Oral)  Resp 18  Ht 5' 5.5" (1.664 m)  Wt 218 lb 2 oz (98.941 kg)  BMI 35.73 kg/m2  SpO2 97% Wt Readings from Last 3 Encounters:  05/12/15 218 lb 2 oz (98.941 kg)  04/21/15 214 lb 8 oz (97.297 kg)  01/12/15 228 lb (103.42 kg)     Lab Results  Component Value Date   WBC 8.2 12/12/2014   HGB 15.0 12/12/2014   HCT 44.4 12/12/2014   PLT 251.0 12/12/2014   GLUCOSE 78 04/14/2015   CHOL 189 04/14/2015   TRIG 78.0 04/14/2015   HDL 57.80 04/14/2015   LDLDIRECT 145.4 03/01/2013   LDLCALC 115* 04/14/2015   ALT 17 04/14/2015   AST 16 04/14/2015   NA 138 04/14/2015   K 3.6 04/14/2015   CL 99 04/14/2015   CREATININE 0.63 04/14/2015   BUN 16 04/14/2015   CO2 30 04/14/2015   TSH 2.59 12/12/2014   HGBA1C 5.4 04/14/2015   MICROALBUR 0.9 05/06/2014    US Abdomen Complete  01/21/2015  CLINICAL DATA:  Abnormal liver function test ; sensation of right upper quadrant fullness. EXAM: ULTRASOUND ABDOMEN COMPLETE COMPARISON:  Abdominal ultrasound of October 31, 2006 FINDINGS: Gallbladder: The gallstone is adequately distended. There are mobile gallstones with the largest measuring 8 mm in diameter. A non mobile focus measures 5 x 7 mm. There is no gallbladder wall thickening, pericholecystic fluid, or positive sonographic Murphy's sign. Common bile duct: Diameter: 3.1 mm Liver: The hepatic echotexture is increased diffusely. There is no focal mass or ductal dilation. A small area of decreased echogenicity adjacent to the gallbladder likely reflect focal fatty sparing. IVC: No abnormality visualized. Pancreas: Visualized portion unremarkable. Spleen: Size and appearance within normal limits. Right Kidney: Length: 12 cm. Echogenicity within normal limits. No mass or hydronephrosis visualized. Left Kidney: Length: 11.9 cm.  Echogenicity within normal limits. No mass or hydronephrosis visualized. Abdominal aorta: No aneurysm visualized. Other findings: There is no ascites. IMPRESSION: 1. Multiple gallstones without sonographic evidence of acute cholecystitis. There may be a 7 mm non mobile gallstone or polyp. 2. Fatty infiltrative change of the liver which is been previously described. 3. No acute abnormality is observed elsewhere in the abdomen. Electronically Signed   By: David  Martinique M.D.   On: 01/21/2015 16:34       Assessment & Plan:   Problem List Items Addressed This Visit    Lumbar herniated disc - Primary    S/p surgery previously.  Sees Dr Arnoldo Morale.  Recent flare with her back.  No radicular symptoms.  rx given  for ibuprofen and valium if needed.  Call if changes or problems or if pain worsens or does not resolve.            Einar Pheasant, MD

## 2015-05-12 NOTE — Progress Notes (Signed)
Pre-visit discussion using our clinic review tool. No additional management support is needed unless otherwise documented below in the visit note.  

## 2015-05-24 ENCOUNTER — Encounter: Payer: Self-pay | Admitting: Internal Medicine

## 2015-05-24 NOTE — Assessment & Plan Note (Signed)
S/p surgery previously.  Sees Dr Arnoldo Morale.  Recent flare with her back.  No radicular symptoms.  rx given for ibuprofen and valium if needed.  Call if changes or problems or if pain worsens or does not resolve.

## 2015-08-19 ENCOUNTER — Other Ambulatory Visit: Payer: BLUE CROSS/BLUE SHIELD

## 2015-08-20 ENCOUNTER — Encounter: Payer: Self-pay | Admitting: Internal Medicine

## 2015-08-20 ENCOUNTER — Ambulatory Visit (INDEPENDENT_AMBULATORY_CARE_PROVIDER_SITE_OTHER): Payer: BLUE CROSS/BLUE SHIELD | Admitting: Internal Medicine

## 2015-08-20 VITALS — BP 120/72 | HR 91 | Temp 97.9°F | Resp 18 | Ht 65.5 in | Wt 229.5 lb

## 2015-08-20 DIAGNOSIS — Z1239 Encounter for other screening for malignant neoplasm of breast: Secondary | ICD-10-CM | POA: Diagnosis not present

## 2015-08-20 DIAGNOSIS — M5126 Other intervertebral disc displacement, lumbar region: Secondary | ICD-10-CM

## 2015-08-20 DIAGNOSIS — E669 Obesity, unspecified: Secondary | ICD-10-CM

## 2015-08-20 DIAGNOSIS — I10 Essential (primary) hypertension: Secondary | ICD-10-CM

## 2015-08-20 DIAGNOSIS — R945 Abnormal results of liver function studies: Secondary | ICD-10-CM

## 2015-08-20 DIAGNOSIS — R739 Hyperglycemia, unspecified: Secondary | ICD-10-CM

## 2015-08-20 DIAGNOSIS — K7689 Other specified diseases of liver: Secondary | ICD-10-CM

## 2015-08-20 DIAGNOSIS — E7800 Pure hypercholesterolemia, unspecified: Secondary | ICD-10-CM

## 2015-08-20 MED ORDER — HYDROCHLOROTHIAZIDE 25 MG PO TABS: 25.0000 mg | ORAL_TABLET | Freq: Every day | ORAL | Status: DC

## 2015-08-20 MED ORDER — LISINOPRIL 10 MG PO TABS: 10.0000 mg | ORAL_TABLET | Freq: Every day | ORAL | Status: DC

## 2015-08-20 NOTE — Progress Notes (Signed)
Pre-visit discussion using our clinic review tool. No additional management support is needed unless otherwise documented below in the visit note.  

## 2015-08-20 NOTE — Progress Notes (Signed)
Patient ID: Joy Houston, female   DOB: [birth year], 50 y.o.   MRN: [id number]   Subjective:    Patient ID: Joy Houston, female    DOB: 03-18-1965, 50 y.o.   MRN: [id number]  HPI  Patient here for a scheduled follow up.  She is doing well.  Just returned from a cruise.  Not watching her diet.  No exercising.  Has gained some of her weight back.  Plans to get started back on her diet.  No chest pain.  No sob.  No acid reflux.  No abdominal pain or cramping.  Bowels stable. Blood pressure doing well.    Past Medical History  Diagnosis Date  . Complication of anesthesia   . PONV (postoperative nausea and vomiting)   . Family history of anesthesia complication     Mother - confusion  . Hypertension   . Heart murmur     Slight - "nothing to worry about"  . Hemorrhoid   . Eczema   . Fatty liver    Past Surgical History  Procedure Laterality Date  . Cesarean section    . Finger surgery      pin to 3rd finger Right hand  . Wisdom tooth extraction    . Lumbar laminectomy/decompression microdiscectomy  12/15/2011    Procedure: LUMBAR LAMINECTOMY/DECOMPRESSION MICRODISCECTOMY 1 LEVEL;  Surgeon: Ophelia Charter, MD;  Location: [facility];  Service: Neurosurgery;  Laterality: Right;  RIGHT Lumbar four-Five diskectomy   Family History  Problem Relation Age of Onset  . Diabetes Father   . Cancer Father     Melanoma, Prostate  . Heart disease Father   . Heart disease Maternal Grandfather   . Diabetes Paternal Grandfather   . GER disease Mother    Social History   Social History  . Marital Status: Married    Spouse Name: N/A  . Number of Children: 2  . Years of Education: N/A   Occupational History  .     Social History Main Topics  . Smoking status: Never Smoker   . Smokeless tobacco: Never Used  . Alcohol Use: 0.0 oz/week    0 Standard drinks or equivalent per week     Comment: rare  . Drug Use: No  . Sexual Activity: Not Asked   Other Topics Concern  .  None   Social History Narrative    Outpatient Encounter Prescriptions as of 08/20/2015  Medication Sig  . Difluprednate 0.05 % EMUL Apply 1 drop to eye 4 (four) times daily as needed.  . hydrochlorothiazide (HYDRODIURIL) 25 MG tablet Take 1 tablet (25 mg total) by mouth daily.  . hydrocortisone (ANUSOL-HC) 25 MG suppository Place 25 mg rectally 2 (two) times daily as needed.  Marland Kitchen ibuprofen (ADVIL,MOTRIN) 800 MG tablet Take 1 tablet (800 mg total) by mouth every 8 (eight) hours as needed.  Marland Kitchen lisinopril (PRINIVIL,ZESTRIL) 10 MG tablet Take 1 tablet (10 mg total) by mouth daily.  . [DISCONTINUED] diazepam (VALIUM) 5 MG tablet Take 1 tablet (5 mg total) by mouth at bedtime as needed for anxiety.  . [DISCONTINUED] hydrochlorothiazide (HYDRODIURIL) 25 MG tablet Take 1 tablet (25 mg total) by mouth daily.  . [DISCONTINUED] lisinopril (PRINIVIL,ZESTRIL) 10 MG tablet TAKE 1 TABLET BY MOUTH EVERY DAY   No facility-administered encounter medications on file as of 08/20/2015.    Review of Systems  Constitutional: Negative for appetite change and unexpected weight change.  HENT: Negative for congestion and sinus pressure.   Respiratory: Negative for  cough, chest tightness and shortness of breath.   Cardiovascular: Negative for chest pain, palpitations and leg swelling.  Gastrointestinal: Negative for nausea, vomiting, abdominal pain and diarrhea.  Genitourinary: Negative for dysuria and difficulty urinating.  Musculoskeletal: Negative for back pain and joint swelling.  Skin: Negative for color change and rash.  Neurological: Negative for dizziness, light-headedness and headaches.  Psychiatric/Behavioral: Negative for dysphoric mood and agitation.       Objective:     Blood pressure rechecked by me:  120/76  Physical Exam  Constitutional: She appears well-developed and well-nourished. No distress.  HENT:  Nose: Nose normal.  Mouth/Throat: Oropharynx is clear and moist.  Neck: Neck supple. No  thyromegaly present.  Cardiovascular: Normal rate and regular rhythm.   Pulmonary/Chest: Breath sounds normal. No respiratory distress. She has no wheezes.  Abdominal: Soft. Bowel sounds are normal. There is no tenderness.  Musculoskeletal: She exhibits no edema or tenderness.  Lymphadenopathy:    She has no cervical adenopathy.  Skin: No rash noted. No erythema.  Psychiatric: She has a normal mood and affect. Her behavior is normal.    BP 120/72 mmHg  Pulse 91  Temp(Src) 97.9 F (36.6 C) (Oral)  Resp 18  Ht 5' 5.5" (1.664 m)  Wt 229 lb 8 oz (104.101 kg)  BMI 37.60 kg/m2  SpO2 97% Wt Readings from Last 3 Encounters:  08/20/15 229 lb 8 oz (104.101 kg)  05/12/15 218 lb 2 oz (98.941 kg)  04/21/15 214 lb 8 oz (97.297 kg)     Lab Results  Component Value Date   WBC 8.2 12/12/2014   HGB 15.0 12/12/2014   HCT 44.4 12/12/2014   PLT 251.0 12/12/2014   GLUCOSE 78 04/14/2015   CHOL 189 04/14/2015   TRIG 78.0 04/14/2015   HDL 57.80 04/14/2015   LDLDIRECT 145.4 03/01/2013   LDLCALC 115* 04/14/2015   ALT 17 04/14/2015   AST 16 04/14/2015   NA 138 04/14/2015   K 3.6 04/14/2015   CL 99 04/14/2015   CREATININE 0.63 04/14/2015   BUN 16 04/14/2015   CO2 30 04/14/2015   TSH 2.59 12/12/2014   HGBA1C 5.4 04/14/2015   MICROALBUR 0.9 05/06/2014    US Abdomen Complete  01/21/2015  CLINICAL DATA:  Abnormal liver function test ; sensation of right upper quadrant fullness. EXAM: ULTRASOUND ABDOMEN COMPLETE COMPARISON:  Abdominal ultrasound of October 31, 2006 FINDINGS: Gallbladder: The gallstone is adequately distended. There are mobile gallstones with the largest measuring 8 mm in diameter. A non mobile focus measures 5 x 7 mm. There is no gallbladder wall thickening, pericholecystic fluid, or positive sonographic Murphy's sign. Common bile duct: Diameter: 3.1 mm Liver: The hepatic echotexture is increased diffusely. There is no focal mass or ductal dilation. A small area of decreased  echogenicity adjacent to the gallbladder likely reflect focal fatty sparing. IVC: No abnormality visualized. Pancreas: Visualized portion unremarkable. Spleen: Size and appearance within normal limits. Right Kidney: Length: 12 cm. Echogenicity within normal limits. No mass or hydronephrosis visualized. Left Kidney: Length: 11.9 cm. Echogenicity within normal limits. No mass or hydronephrosis visualized. Abdominal aorta: No aneurysm visualized. Other findings: There is no ascites. IMPRESSION: 1. Multiple gallstones without sonographic evidence of acute cholecystitis. There may be a 7 mm non mobile gallstone or polyp. 2. Fatty infiltrative change of the liver which is been previously described. 3. No acute abnormality is observed elsewhere in the abdomen. Electronically Signed   By: David  Martinique M.D.   On: 01/21/2015 16:34  Assessment & Plan:   Problem List Items Addressed This Visit    Abnormal liver function    Ultrasound - fatty liver.  Last liver check wnl.  Discussed diet and exercise.        Hypercholesterolemia    Low cholesterol diet and exercise.  Follow lipid panel.        Relevant Medications   lisinopril (PRINIVIL,ZESTRIL) 10 MG tablet   hydrochlorothiazide (HYDRODIURIL) 25 MG tablet   Hyperglycemia    Low carb diet and exercise.  Follow met b and a1c.  Weight loss.        Hypertension    Blood pressure under good control.  Continue same medication regimen.  Follow pressures.  Follow metabolic panel.        Relevant Medications   lisinopril (PRINIVIL,ZESTRIL) 10 MG tablet   hydrochlorothiazide (HYDRODIURIL) 25 MG tablet   Lumbar herniated disc    S/p previous surgery.  Sees Dr Arnoldo Morale.  Stable.       Obesity (BMI 30-39.9)    Diet and exercise.         Other Visit Diagnoses    Screening breast examination    -  Primary    Relevant Orders    MM DIGITAL SCREENING BILATERAL        Einar Pheasant, MD

## 2015-08-22 ENCOUNTER — Encounter: Payer: Self-pay | Admitting: Internal Medicine

## 2015-08-22 NOTE — Assessment & Plan Note (Signed)
Ultrasound - fatty liver.  Last liver check wnl.  Discussed diet and exercise.

## 2015-08-22 NOTE — Assessment & Plan Note (Signed)
Low cholesterol diet and exercise.  Follow lipid panel.   

## 2015-08-22 NOTE — Assessment & Plan Note (Signed)
Diet and exercise.   

## 2015-08-22 NOTE — Assessment & Plan Note (Signed)
Low carb diet and exercise.  Follow met b and a1c.  Weight loss.

## 2015-08-22 NOTE — Assessment & Plan Note (Signed)
Blood pressure under good control.  Continue same medication regimen.  Follow pressures.  Follow metabolic panel.   

## 2015-08-22 NOTE — Assessment & Plan Note (Signed)
S/p previous surgery.  Sees Dr Arnoldo Morale.  Stable.

## 2015-10-12 ENCOUNTER — Ambulatory Visit: Admission: RE | Admit: 2015-10-12 | Payer: BLUE CROSS/BLUE SHIELD | Source: Ambulatory Visit

## 2015-10-23 ENCOUNTER — Other Ambulatory Visit: Payer: Self-pay | Admitting: Internal Medicine

## 2015-10-23 ENCOUNTER — Ambulatory Visit
Admission: RE | Admit: 2015-10-23 | Discharge: 2015-10-23 | Disposition: A | Discharge status: Home / Self Care | Payer: BLUE CROSS/BLUE SHIELD | Source: Ambulatory Visit | Attending: Internal Medicine | Admitting: Internal Medicine

## 2015-10-23 ENCOUNTER — Other Ambulatory Visit (INDEPENDENT_AMBULATORY_CARE_PROVIDER_SITE_OTHER): Payer: BLUE CROSS/BLUE SHIELD

## 2015-10-23 DIAGNOSIS — I10 Essential (primary) hypertension: Secondary | ICD-10-CM

## 2015-10-23 DIAGNOSIS — K7689 Other specified diseases of liver: Secondary | ICD-10-CM | POA: Diagnosis not present

## 2015-10-23 DIAGNOSIS — Z1231 Encounter for screening mammogram for malignant neoplasm of breast: Secondary | ICD-10-CM | POA: Insufficient documentation

## 2015-10-23 DIAGNOSIS — E7800 Pure hypercholesterolemia, unspecified: Secondary | ICD-10-CM

## 2015-10-23 DIAGNOSIS — Z1239 Encounter for other screening for malignant neoplasm of breast: Secondary | ICD-10-CM | POA: Diagnosis not present

## 2015-10-23 DIAGNOSIS — R945 Abnormal results of liver function studies: Secondary | ICD-10-CM

## 2015-10-23 DIAGNOSIS — R739 Hyperglycemia, unspecified: Secondary | ICD-10-CM | POA: Diagnosis not present

## 2015-10-23 LAB — LIPID PANEL
CHOL/HDL RATIO: 3
CHOLESTEROL: 185 mg/dL (ref 0–200)
HDL: 55.4 mg/dL (ref >39.00)
LDL CALC: 112 mg/dL — AB (ref 0–99)
NonHDL: 129.23
Triglycerides: 88 mg/dL (ref 0.0–149.0)
VLDL: 17.6 mg/dL (ref 0.0–40.0)

## 2015-10-23 LAB — HEPATIC FUNCTION PANEL
ALT: 20 U/L (ref 0–35)
AST: 17 U/L (ref 0–37)
Albumin: 4.1 g/dL (ref 3.5–5.2)
Alkaline Phosphatase: 59 U/L (ref 39–117)
BILIRUBIN TOTAL: 0.5 mg/dL (ref 0.2–1.2)
Bilirubin, Direct: 0 mg/dL (ref 0.0–0.3)
Total Protein: 7.3 g/dL (ref 6.0–8.3)

## 2015-10-23 LAB — BASIC METABOLIC PANEL
BUN: 16 mg/dL (ref 6–23)
CO2: 28 mEq/L (ref 19–32)
Calcium: 9.2 mg/dL (ref 8.4–10.5)
Chloride: 104 mEq/L (ref 96–112)
Creatinine, Ser: 0.72 mg/dL (ref 0.40–1.20)
GFR: 91.81 mL/min (ref >60.00)
GLUCOSE: 91 mg/dL (ref 70–99)
POTASSIUM: 3.9 meq/L (ref 3.5–5.1)
SODIUM: 139 meq/L (ref 135–145)

## 2015-10-23 LAB — HEMOGLOBIN A1C: HEMOGLOBIN A1C: 5.5 % (ref 4.6–6.5)

## 2015-10-24 ENCOUNTER — Encounter: Payer: Self-pay | Admitting: Internal Medicine

## 2015-10-28 ENCOUNTER — Ambulatory Visit: Payer: BLUE CROSS/BLUE SHIELD

## 2015-10-30 ENCOUNTER — Other Ambulatory Visit: Payer: Self-pay | Admitting: Internal Medicine

## 2015-10-31 ENCOUNTER — Encounter: Payer: Self-pay | Admitting: Emergency Medicine

## 2015-10-31 ENCOUNTER — Emergency Department
Admission: EM | Admit: 2015-10-31 | Discharge: 2015-10-31 | Disposition: A | Discharge status: Home / Self Care | Payer: BLUE CROSS/BLUE SHIELD | Attending: Emergency Medicine | Admitting: Emergency Medicine

## 2015-10-31 ENCOUNTER — Emergency Department: Payer: BLUE CROSS/BLUE SHIELD

## 2015-10-31 DIAGNOSIS — M5186 Other intervertebral disc disorders, lumbar region: Secondary | ICD-10-CM | POA: Diagnosis not present

## 2015-10-31 DIAGNOSIS — I10 Essential (primary) hypertension: Secondary | ICD-10-CM | POA: Diagnosis not present

## 2015-10-31 DIAGNOSIS — R200 Anesthesia of skin: Secondary | ICD-10-CM | POA: Insufficient documentation

## 2015-10-31 DIAGNOSIS — M5136 Other intervertebral disc degeneration, lumbar region: Secondary | ICD-10-CM

## 2015-10-31 DIAGNOSIS — M5386 Other specified dorsopathies, lumbar region: Secondary | ICD-10-CM | POA: Diagnosis not present

## 2015-10-31 DIAGNOSIS — M5126 Other intervertebral disc displacement, lumbar region: Secondary | ICD-10-CM

## 2015-10-31 DIAGNOSIS — M545 Low back pain, unspecified: Secondary | ICD-10-CM

## 2015-10-31 DIAGNOSIS — M546 Pain in thoracic spine: Secondary | ICD-10-CM | POA: Diagnosis not present

## 2015-10-31 DIAGNOSIS — M4806 Spinal stenosis, lumbar region: Secondary | ICD-10-CM | POA: Diagnosis not present

## 2015-10-31 LAB — POCT PREGNANCY, URINE: Preg Test, Ur: NEGATIVE

## 2015-10-31 MED ORDER — KETOROLAC TROMETHAMINE 30 MG/ML IJ SOLN
30.0000 mg | Freq: Once | INTRAMUSCULAR | Status: AC
  Administered 2015-10-31: 30 mg via INTRAVENOUS

## 2015-10-31 MED ORDER — KETOROLAC TROMETHAMINE 10 MG PO TABS: 10.0000 mg | ORAL_TABLET | Freq: Four times a day (QID) | ORAL | 0 refills | Status: DC | PRN

## 2015-10-31 MED ORDER — DIAZEPAM 5 MG/ML IJ SOLN
5.0000 mg | Freq: Once | INTRAMUSCULAR | Status: AC
  Administered 2015-10-31: 5 mg via INTRAVENOUS
  Filled 2015-10-31: qty 2

## 2015-10-31 MED ORDER — KETOROLAC TROMETHAMINE 30 MG/ML IJ SOLN
INTRAMUSCULAR | Status: AC
  Administered 2015-10-31: 30 mg via INTRAVENOUS
  Filled 2015-10-31: qty 1

## 2015-10-31 MED ORDER — DIAZEPAM 5 MG PO TABS: 5.0000 mg | ORAL_TABLET | Freq: Three times a day (TID) | ORAL | 0 refills | Status: DC | PRN

## 2015-10-31 NOTE — ED Provider Notes (Addendum)
-----------------------------------------   10:27 AM on 10/31/2015 ----------------------------------------- Care was assumed from Dr. Owens Shark at 7 AM pending results of MRI of the lumbar spine. MRI shows L4/L5 central disc protrusion with some mass effect on the left L5 nerve root. On her MRI in 2013, the patient did have some component of disc protrusion as well. She denies any fevers, has no numbness or weakness in the legs, no bowel or bladder incontinence. Her pain is improved with Valium, she is able to ambulate. We discussed meticulous return precautions and need for close follow-up with her surgeon who did her microdiscectomy in 2013. Patient is comfortable with the discharge plan. DC home. Upreg negative, will dc with toradol and valium po.   MRI lumbar spine IMPRESSION: 1. At L4-5 there is a central disc protrusion with disc material migrating along the left paracentral aspect with severe narrowing of the left lateral recess and mass effect on the left intraspinal L5 nerve root. Right lateral recess stenosis. Mild bilateral facet arthropathy. Mild spinal stenosis.   Joanne Gavel, MD 10/31/15 1029    Joanne Gavel, MD 10/31/15 1115

## 2015-10-31 NOTE — ED Notes (Signed)
Pt resting quietley, states that she is more comfortable, pt rates pain at a 3/10, with discomfort behind the knee

## 2015-10-31 NOTE — ED Notes (Signed)
Pt taken to MRI  

## 2015-10-31 NOTE — ED Provider Notes (Signed)
[facility] Emergency Department Provider Note    First MD Initiated Contact with Patient 10/31/15 [phone]     (approximate)  I have reviewed the triage vital signs and the nursing notes.   HISTORY  Chief Complaint Back Pain (Pt. ambulatory to triage with back pain.)    HPI Joy Houston is a 50 y.o. female strips sciatica status post microdiscectomy in 2013 presents to the emergency department with left paraspinal lumbar pain is currently 10 out of 10 associated with "muscle spasms. Patient states that the pain radiates down the posterior aspect of her left leg terminating at the level of the knee. Patient states pain onset was after lifting a Joy Houston on Wednesday. Patient states that the pain has progressively worsened since that time reaching its maximum intensity last night.   Past Medical History:  Diagnosis Date  . Complication of anesthesia   . Eczema   . Family history of anesthesia complication    Mother - confusion  . Fatty liver   . Heart murmur    Slight - "nothing to worry about"  . Hemorrhoid   . Hypertension   . PONV (postoperative nausea and vomiting)     Patient Active Problem List   Diagnosis Date Noted  . Abdominal fullness in right upper quadrant 01/13/2015  . Sebaceous cyst 09/13/2014  . Change in vision 05/06/2014  . Vaginitis 05/06/2014  . Health care maintenance 05/06/2014  . Obesity (BMI 30-39.9) 09/05/2013  . Pneumonia 12/22/2012  . Abnormal liver function 01/08/2012  . Hypercholesterolemia 01/08/2012  . Hematuria 01/08/2012  . Hyperglycemia 01/08/2012  . Hypertension 01/04/2012  . Lumbar herniated disc 12/15/2011    Past Surgical History:  Procedure Laterality Date  . CESAREAN SECTION    . FINGER SURGERY     pin to 3rd finger Right hand  . LUMBAR LAMINECTOMY/DECOMPRESSION MICRODISCECTOMY  12/15/2011   Procedure: LUMBAR LAMINECTOMY/DECOMPRESSION MICRODISCECTOMY 1 LEVEL;  Surgeon: Ophelia Charter, MD;   Location: [place] [facility];  Service: Neurosurgery;  Laterality: Right;  RIGHT Lumbar four-Five diskectomy  . WISDOM TOOTH EXTRACTION      Prior to Admission medications   Medication Sig Start Date End Date Taking? Authorizing Provider  Difluprednate 0.05 % EMUL Apply 1 drop to eye 4 (four) times daily as needed. 03/06/15   Versie Starks, PA-C  hydrochlorothiazide (HYDRODIURIL) 25 MG tablet Take 1 tablet (25 mg total) by mouth daily. 08/20/15   Einar Pheasant, MD  hydrocortisone (ANUSOL-HC) 25 MG suppository Place 25 mg rectally 2 (two) times daily as needed. 04/04/12   Einar Pheasant, MD  ibuprofen (ADVIL,MOTRIN) 800 MG tablet Take 1 tablet (800 mg total) by mouth every 8 (eight) hours as needed. 05/12/15   Einar Pheasant, MD  lisinopril (PRINIVIL,ZESTRIL) 10 MG tablet Take 1 tablet (10 mg total) by mouth daily. 08/20/15   Einar Pheasant, MD    Allergies Morphine and related  Family History  Problem Relation Age of Onset  . GER disease Mother   . Diabetes Father   . Cancer Father     Melanoma, Prostate  . Heart disease Father   . Heart disease Maternal Grandfather   . Diabetes Paternal Grandfather   . Breast cancer Sister 40    Social History Social History  Substance Use Topics  . Smoking status: Never Smoker  . Smokeless tobacco: Never Used  . Alcohol use 0.0 oz/week     Comment: rare    Review of Systems Constitutional: No fever/chills Eyes: No  visual changes. ENT: No sore throat. Cardiovascular: Denies chest pain. Respiratory: Denies shortness of breath. Gastrointestinal: No abdominal pain.  No nausea, no vomiting.  No diarrhea.  No constipation. Genitourinary: Negative for dysuria. Musculoskeletal: Negative for back pain. Skin: Negative for rash. Neurological: Negative for headaches, focal weakness or numbness.  10-point ROS otherwise negative.  ____________________________________________   PHYSICAL EXAM:  VITAL SIGNS: ED Triage Vitals  Enc Vitals Group      BP 10/31/15 0600 (!) 150/92     Pulse Rate 10/31/15 0600 82     Resp 10/31/15 0600 18     Temp 10/31/15 0600 97.5 F (36.4 C)     Temp Source 10/31/15 0600 Oral     SpO2 10/31/15 0600 97 %     Weight 10/31/15 0556 225 lb (102.1 kg)     Height 10/31/15 0556 5\' 6"  (1.676 m)     Head Circumference --      Peak Flow --      Pain Score 10/31/15 0556 9     Pain Loc --      Pain Edu? --      Excl. in [place]? --    Constitutional: Alert and oriented. Apparent discomfort Eyes: Conjunctivae are normal. PERRL. EOMI. Head: Atraumatic. Mouth/Throat: Mucous membranes are moist.  Oropharynx non-erythematous. Neck: No stridor.  No meningeal signs.   Cardiovascular: Normal rate, regular rhythm. Good peripheral circulation. Grossly normal heart sounds. Respiratory: Normal respiratory effort.  No retractions. Lungs CTAB. Gastrointestinal: Soft and nontender. No distention.  Musculoskeletal: No lower extremity tenderness nor edema. No gross deformities of extremities. Pain with palpation left paraspinal muscles L3-L5 Neurologic:  Normal speech and language. No gross focal neurologic deficits are appreciated.  Skin:  Skin is warm, dry and intact. No rash noted. Psychiatric: Mood and affect are normal. Speech and behavior are normal.  ____________________________________________    RADIOLOGY     Procedures      INITIAL IMPRESSION / ASSESSMENT AND PLAN / ED COURSE  Pertinent labs & imaging results that were available during my care of the patient were reviewed by me and considered in my medical decision making (see chart for details).  Patient given IV Valium 5 mg and Toradol 30 mg. I revisited the patient at approximately 7:15 AM at which point she informed me that her pain is much improved. MRI lumbar spine ordered for the patient with concern for possible disc herniation. Patient's care transferred to Dr. Madaline Savage   Clinical Course    ____________________________________________  FINAL  CLINICAL IMPRESSION(S) / ED DIAGNOSES  Final diagnoses:  Left leg numbness  Lumbar spine pain     MEDICATIONS GIVEN DURING THIS VISIT:  Medications  diazepam (VALIUM) injection 5 mg (5 mg Intravenous Given 10/31/15 0627)     NEW OUTPATIENT MEDICATIONS STARTED DURING THIS VISIT:  New Prescriptions   No medications on file    Modified Medications   No medications on file    Discontinued Medications   No medications on file     Note:  This document was prepared using Dragon voice recognition software and may include unintentional dictation errors.    Gregor Hams, MD 11/01/15 [id number]

## 2015-10-31 NOTE — ED Triage Notes (Signed)
Pt. States lt. Sided lumbar back pain.  Pt. States hx of back surgery in 2013.  Pt. States she helped lift a hurt dog on Wednesday.  Pt. States pain radiates down lt. Leg.

## 2015-10-31 NOTE — ED Notes (Signed)
Relayed to MD patient reported decreased pain @ 5/10, BP is now at 122/57

## 2015-10-31 NOTE — ED Notes (Signed)
Pt back from MRI 

## 2015-10-31 NOTE — ED Notes (Signed)
Dr Owens Shark at bedside, pt resting more comfortably and states pain down to a 5/10, pt has her left leg propped on pillows, pt reminded of the call bell and to use it as needed, also discussed upcoming MRI while in the ER

## 2015-11-02 ENCOUNTER — Telehealth: Payer: Self-pay | Admitting: Internal Medicine

## 2015-11-02 MED ORDER — IBUPROFEN 800 MG PO TABS: ORAL_TABLET | ORAL | 0 refills | Status: DC

## 2015-11-02 NOTE — Telephone Encounter (Signed)
ok'd rx for ibuprofen #30 with no refills.

## 2015-11-02 NOTE — Telephone Encounter (Signed)
If able to do without toradol, then would try to minimize the amount of toradol she is taking.  I just received a request today for ibuprofen ( she had requested this medication 10/30/15 per note).  Would not want her taking both medications.  If has to take one of these, would prefer ibuprofen.  Please confirm she is scheduled to see her surgeon - given the increased pain.

## 2015-11-02 NOTE — Telephone Encounter (Signed)
Gave Toradol (every 6 hours) for the pain and valium (not taken since the ED, 12 pills given) for Muscle spasms  BP medications might have a contraindication  Weight bearing issues due to pain, L4-5 herniation, left feet and toe are numb, has an appt with a acupuncturist tomorrow am and then Nedurologist appt hopefully tomorrow.

## 2015-11-02 NOTE — Telephone Encounter (Signed)
I sent in the prescription for the ibuprofen.  I would recommend taking the minimal effective dose.  Keep appt with neurosurgery.

## 2015-11-02 NOTE — Telephone Encounter (Signed)
Pt wanted to let Dr. Nicki Reaper know that she was seen in the ED for a herniated disc and that she had some questions about the medicine they put her on.. Please  Advise pt

## 2015-11-02 NOTE — Telephone Encounter (Signed)
Spoke with the patient, she hsas decreased to almost no Toradol, she would rather take the ibuprofen if you can call that in.  The neurologist is the surgeon and she is seeing him tomorrow.

## 2015-11-03 DIAGNOSIS — M545 Low back pain: Secondary | ICD-10-CM | POA: Diagnosis not present

## 2015-11-03 DIAGNOSIS — Z6837 Body mass index (BMI) 37.0-37.9, adult: Secondary | ICD-10-CM | POA: Diagnosis not present

## 2015-11-03 DIAGNOSIS — M5126 Other intervertebral disc displacement, lumbar region: Secondary | ICD-10-CM | POA: Diagnosis not present

## 2015-11-03 DIAGNOSIS — M5416 Radiculopathy, lumbar region: Secondary | ICD-10-CM | POA: Diagnosis not present

## 2015-11-03 NOTE — Telephone Encounter (Signed)
Noted  

## 2015-12-03 ENCOUNTER — Telehealth: Payer: Self-pay | Admitting: Internal Medicine

## 2015-12-03 NOTE — Telephone Encounter (Signed)
Pt called and wants to have her son Lurena Joiner to establish as a new patient with you. He will be switching over from [facility]. Please advise, thank you!  Call pt @ [phone]

## 2015-12-03 NOTE — Telephone Encounter (Signed)
Please advise 

## 2015-12-03 NOTE — Telephone Encounter (Signed)
I do not mind, just need to know how old he is.  I see 36 and older.  Can send to [facility] to schedule if age ok.

## 2015-12-25 ENCOUNTER — Ambulatory Visit (INDEPENDENT_AMBULATORY_CARE_PROVIDER_SITE_OTHER): Payer: BLUE CROSS/BLUE SHIELD | Admitting: Internal Medicine

## 2015-12-25 ENCOUNTER — Encounter: Payer: Self-pay | Admitting: Internal Medicine

## 2015-12-25 DIAGNOSIS — R739 Hyperglycemia, unspecified: Secondary | ICD-10-CM | POA: Diagnosis not present

## 2015-12-25 DIAGNOSIS — K7689 Other specified diseases of liver: Secondary | ICD-10-CM

## 2015-12-25 DIAGNOSIS — E669 Obesity, unspecified: Secondary | ICD-10-CM

## 2015-12-25 DIAGNOSIS — R945 Abnormal results of liver function studies: Secondary | ICD-10-CM

## 2015-12-25 DIAGNOSIS — I10 Essential (primary) hypertension: Secondary | ICD-10-CM | POA: Diagnosis not present

## 2015-12-25 DIAGNOSIS — E7800 Pure hypercholesterolemia, unspecified: Secondary | ICD-10-CM

## 2015-12-25 DIAGNOSIS — M5126 Other intervertebral disc displacement, lumbar region: Secondary | ICD-10-CM | POA: Diagnosis not present

## 2015-12-25 NOTE — Progress Notes (Signed)
Pre visit review using our clinic review tool, if applicable. No additional management support is needed unless otherwise documented below in the visit note. 

## 2015-12-25 NOTE — Progress Notes (Signed)
Patient ID: Joy Houston, female   DOB: 22-Dec-1965, 50 y.o.   MRN: [id number]   Subjective:    Patient ID: Joy Houston, female    DOB: 05/10/1965, 50 y.o.   MRN: [id number]  HPI  Patient here for a scheduled follow up.  She is having increased problems with her back.  Having foot drop in left foot.  Some weakness and numbness - left foot.  Has seen neurosurgery.  Recommending surgery.  She drags her left foot when she walks.  Limiting her activity.  No chest pain.  No sob.  No acid reflux.  No abdominal pain or cramping.  Bowels stable.     Past Medical History:  Diagnosis Date  . Complication of anesthesia   . Eczema   . Family history of anesthesia complication    Mother - confusion  . Fatty liver   . Heart murmur    Slight - "nothing to worry about"  . Hemorrhoid   . Hypertension   . PONV (postoperative nausea and vomiting)    Past Surgical History:  Procedure Laterality Date  . CESAREAN SECTION    . FINGER SURGERY     pin to 3rd finger Right hand  . LUMBAR LAMINECTOMY/DECOMPRESSION MICRODISCECTOMY  12/15/2011   Procedure: LUMBAR LAMINECTOMY/DECOMPRESSION MICRODISCECTOMY 1 LEVEL;  Surgeon: Ophelia Charter, MD;  Location: [facility];  Service: Neurosurgery;  Laterality: Right;  RIGHT Lumbar four-Five diskectomy  . WISDOM TOOTH EXTRACTION     Family History  Problem Relation Age of Onset  . GER disease Mother   . Diabetes Father   . Cancer Father     Melanoma, Prostate  . Heart disease Father   . Heart disease Maternal Grandfather   . Diabetes Paternal Grandfather   . Breast cancer Sister 84   Social History   Social History  . Marital status: Married    Spouse name: N/A  . Number of children: 2  . Years of education: N/A   Occupational History  .  [employer]   Social History Main Topics  . Smoking status: Never Smoker  . Smokeless tobacco: Never Used  . Alcohol use 0.0 oz/week     Comment: rare  . Drug use: No  . Sexual activity: Not  Asked   Other Topics Concern  . None   Social History Narrative  . None    Outpatient Encounter Prescriptions as of 12/25/2015  Medication Sig  . diazepam (VALIUM) 5 MG tablet Take 1 tablet (5 mg total) by mouth every 8 (eight) hours as needed for muscle spasms (Do Not drive while taking this medication).  . hydrochlorothiazide (HYDRODIURIL) 25 MG tablet Take 1 tablet (25 mg total) by mouth daily.  Marland Kitchen ibuprofen (ADVIL,MOTRIN) 800 MG tablet One tablet q 8-12 hours prn  . ketorolac (TORADOL) 10 MG tablet Take 1 tablet (10 mg total) by mouth every 6 (six) hours as needed for moderate pain or severe pain.  Marland Kitchen lisinopril (PRINIVIL,ZESTRIL) 10 MG tablet Take 1 tablet (10 mg total) by mouth daily.  . [DISCONTINUED] Difluprednate 0.05 % EMUL Apply 1 drop to eye 4 (four) times daily as needed.  . [DISCONTINUED] hydrocortisone (ANUSOL-HC) 25 MG suppository Place 25 mg rectally 2 (two) times daily as needed.   No facility-administered encounter medications on file as of 12/25/2015.     Review of Systems  Constitutional: Negative for appetite change and unexpected weight change.  HENT: Negative for congestion and sinus pressure.   Respiratory: Negative for cough,  chest tightness and shortness of breath.   Cardiovascular: Negative for chest pain, palpitations and leg swelling.  Gastrointestinal: Negative for abdominal pain, diarrhea, nausea and vomiting.  Genitourinary: Negative for difficulty urinating and dysuria.  Musculoskeletal: Positive for back pain. Negative for joint swelling.  Neurological: Negative for dizziness, light-headedness and headaches.  Psychiatric/Behavioral: Negative for agitation and dysphoric mood.       Objective:     Blood pressure rechecked by me:  124/78  Physical Exam  Constitutional: She appears well-developed and well-nourished. No distress.  HENT:  Nose: Nose normal.  Mouth/Throat: Oropharynx is clear and moist.  Neck: Neck supple. No thyromegaly present.    Cardiovascular: Normal rate and regular rhythm.   Pulmonary/Chest: Breath sounds normal. No respiratory distress. She has no wheezes.  Abdominal: Soft. Bowel sounds are normal. There is no tenderness.  Musculoskeletal: She exhibits no edema or tenderness.  Some weakness noted in left foot with dorsi and plantar flexion.  Drags her left foot when walks.    Lymphadenopathy:    She has no cervical adenopathy.  Skin: No rash noted. No erythema.  Psychiatric: She has a normal mood and affect. Her behavior is normal.    BP 118/60   Pulse 67   Temp 97.8 F (36.6 C) (Oral)   Ht _0  (1.651 m)   Wt 237 lb 12.8 oz (107.9 kg)   SpO2 98%   BMI 39.57 kg/m  Wt Readings from Last 3 Encounters:  12/25/15 237 lb 12.8 oz (107.9 kg)  10/31/15 225 lb (102.1 kg)  08/20/15 229 lb 8 oz (104.1 kg)     Lab Results  Component Value Date   WBC 8.2 12/12/2014   HGB 15.0 12/12/2014   HCT 44.4 12/12/2014   PLT 251.0 12/12/2014   GLUCOSE 91 10/23/2015   CHOL 185 10/23/2015   TRIG 88.0 10/23/2015   HDL 55.40 10/23/2015   LDLDIRECT 145.4 03/01/2013   LDLCALC 112 (H) 10/23/2015   ALT 20 10/23/2015   AST 17 10/23/2015   NA 139 10/23/2015   K 3.9 10/23/2015   CL 104 10/23/2015   CREATININE 0.72 10/23/2015   BUN 16 10/23/2015   CO2 28 10/23/2015   TSH 2.59 12/12/2014   HGBA1C 5.5 10/23/2015   MICROALBUR 0.9 05/06/2014    Mr Lumbar Spine Wo Contrast  Result Date: 10/31/2015 CLINICAL DATA:  Left side lumbar pain. History of back surgery in 2013. EXAM: MRI LUMBAR SPINE WITHOUT CONTRAST TECHNIQUE: Multiplanar, multisequence MR imaging of the lumbar spine was performed. No intravenous contrast was administered. COMPARISON:  11/29/2011 FINDINGS: Segmentation:  Standard. Alignment:  Physiologic. Vertebrae:  No fracture, evidence of discitis, or bone lesion. Conus medullaris: Extends to the L1 level and appears normal. Paraspinal and other soft tissues: No focal paraspinal abnormality. Disc levels: Disc  spaces: Disc desiccation at L2-3, L3-4, L4-5 and L5-S1. T12-L1: Shallow right paracentral disc protrusion. No evidence of neural foraminal stenosis. No central canal stenosis. L1-L2: No significant disc bulge. No evidence of neural foraminal stenosis. No central canal stenosis. L2-L3: Posterior annular fissure. No evidence of neural foraminal stenosis. No central canal stenosis. L3-L4: Small posterior annular fissure. No disc protrusion. Bilateral lateral recess narrowing. No evidence of neural foraminal stenosis. No central canal stenosis. L4-L5: Central disc protrusion with disc material migrating along the left paracentral aspect with severe narrowing of the left lateral recess and mass effect on the left intraspinal L5 nerve root. Right lateral recess stenosis. Mild bilateral facet arthropathy. Mild spinal stenosis. L5-S1: Broad-based  disc bulge. No evidence of neural foraminal stenosis. No central canal stenosis. IMPRESSION: 1. At L4-5 there is a central disc protrusion with disc material migrating along the left paracentral aspect with severe narrowing of the left lateral recess and mass effect on the left intraspinal L5 nerve root. Right lateral recess stenosis. Mild bilateral facet arthropathy. Mild spinal stenosis. Electronically Signed   By: Kathreen Devoid   On: 10/31/2015 08:55       Assessment & Plan:   Problem List Items Addressed This Visit    Abnormal liver function    Discussed the need for diet, exercise and weight loss.  Previous ultrasound revealed fatty liver.  Follow liver panel.        Relevant Orders   Hepatic function panel   Hypercholesterolemia    Low cholesterol diet and exercise.  Follow lipid panel.        Relevant Orders   Lipid panel   Hyperglycemia    Low carb diet and exercise.  Follow met b and a1c.       Relevant Orders   Hemoglobin A1c   Hypertension    Blood pressure under good control.  Continue same medication regimen.  Follow pressures.  Follow  metabolic panel.        Relevant Orders   CBC with Differential/Platelet   Basic metabolic panel   TSH   Lumbar herniated disc    S/p previous surgery.  With reoccurring back pain now.  Also describes foot drop, etc.  Saw Dr Arnoldo Morale.  Recommending surgery.  Discussed with her today.  She has f/u with Dr Arnoldo Morale scheduled this month.        Obesity (BMI 30-39.9)    Diet and exercise.  Follow.           Einar Pheasant, MD

## 2015-12-27 ENCOUNTER — Encounter: Payer: Self-pay | Admitting: Internal Medicine

## 2015-12-27 NOTE — Assessment & Plan Note (Signed)
Low carb diet and exercise.  Follow met b and a1c.  

## 2015-12-27 NOTE — Assessment & Plan Note (Signed)
Low cholesterol diet and exercise.  Follow lipid panel.   

## 2015-12-27 NOTE — Assessment & Plan Note (Signed)
Blood pressure under good control.  Continue same medication regimen.  Follow pressures.  Follow metabolic panel.   

## 2015-12-27 NOTE — Assessment & Plan Note (Signed)
S/p previous surgery.  With reoccurring back pain now.  Also describes foot drop, etc.  Saw Dr Arnoldo Morale.  Recommending surgery.  Discussed with her today.  She has f/u with Dr Arnoldo Morale scheduled this month.

## 2015-12-27 NOTE — Assessment & Plan Note (Signed)
Diet and exercise.  Follow.  

## 2015-12-27 NOTE — Assessment & Plan Note (Signed)
Discussed the need for diet, exercise and weight loss.  Previous ultrasound revealed fatty liver.  Follow liver panel.

## 2016-01-12 DIAGNOSIS — M5416 Radiculopathy, lumbar region: Secondary | ICD-10-CM | POA: Diagnosis not present

## 2016-01-12 DIAGNOSIS — M5126 Other intervertebral disc displacement, lumbar region: Secondary | ICD-10-CM | POA: Diagnosis not present

## 2016-01-12 DIAGNOSIS — Z6838 Body mass index (BMI) 38.0-38.9, adult: Secondary | ICD-10-CM | POA: Diagnosis not present

## 2016-01-12 DIAGNOSIS — M21372 Foot drop, left foot: Secondary | ICD-10-CM | POA: Diagnosis not present

## 2016-01-27 DIAGNOSIS — M5126 Other intervertebral disc displacement, lumbar region: Secondary | ICD-10-CM | POA: Diagnosis not present

## 2016-02-11 ENCOUNTER — Telehealth: Payer: Self-pay | Admitting: Internal Medicine

## 2016-02-11 NOTE — Telephone Encounter (Signed)
Patient advised and verbalized an understanding  

## 2016-02-11 NOTE — Telephone Encounter (Signed)
Since she has had recent surgery and now having abdominal pain, I do think she needs to go ahead and be evaluated to confirm nothing more acute going on.

## 2016-02-11 NOTE — Telephone Encounter (Signed)
Pt called requesting an appt. C/o lower abdominal pain (feels like menstrual cramps). Please advise, thank you!  Call pt @ [phone]

## 2016-02-11 NOTE — Telephone Encounter (Signed)
Patient calls complaining of abdominal pain.  Called patient back advised her that she would need to go to urgent care to be evaluated.  She insisted for me to send you a message.  She stated she  back surgery 2 weeks ago and she has been having lower abdominal pain for 2 days.  She declines taking opoid pain medications .  Only taking Ibuprofen and tylenol for pain.  No longer has menses.   States she is having regular BM and has been taking Miralax.  Please advise.

## 2016-02-13 DIAGNOSIS — R300 Dysuria: Secondary | ICD-10-CM | POA: Diagnosis not present

## 2016-03-01 DIAGNOSIS — L0291 Cutaneous abscess, unspecified: Secondary | ICD-10-CM | POA: Diagnosis not present

## 2016-03-01 DIAGNOSIS — L820 Inflamed seborrheic keratosis: Secondary | ICD-10-CM | POA: Diagnosis not present

## 2016-03-01 DIAGNOSIS — L821 Other seborrheic keratosis: Secondary | ICD-10-CM | POA: Diagnosis not present

## 2016-03-01 DIAGNOSIS — L300 Nummular dermatitis: Secondary | ICD-10-CM | POA: Diagnosis not present

## 2016-03-30 ENCOUNTER — Other Ambulatory Visit: Payer: BLUE CROSS/BLUE SHIELD

## 2016-04-01 ENCOUNTER — Telehealth: Payer: Self-pay | Admitting: Internal Medicine

## 2016-04-01 ENCOUNTER — Other Ambulatory Visit (INDEPENDENT_AMBULATORY_CARE_PROVIDER_SITE_OTHER): Payer: BLUE CROSS/BLUE SHIELD

## 2016-04-01 DIAGNOSIS — E7800 Pure hypercholesterolemia, unspecified: Secondary | ICD-10-CM | POA: Diagnosis not present

## 2016-04-01 DIAGNOSIS — R739 Hyperglycemia, unspecified: Secondary | ICD-10-CM

## 2016-04-01 DIAGNOSIS — I10 Essential (primary) hypertension: Secondary | ICD-10-CM | POA: Diagnosis not present

## 2016-04-01 DIAGNOSIS — K7689 Other specified diseases of liver: Secondary | ICD-10-CM

## 2016-04-01 DIAGNOSIS — R945 Abnormal results of liver function studies: Secondary | ICD-10-CM

## 2016-04-01 LAB — CBC WITH DIFFERENTIAL/PLATELET
BASOS PCT: 0.6 % (ref 0.0–3.0)
Basophils Absolute: 0.1 10*3/uL (ref 0.0–0.1)
EOS ABS: 0.1 10*3/uL (ref 0.0–0.7)
EOS PCT: 1 % (ref 0.0–5.0)
HEMATOCRIT: 43.6 % (ref 36.0–46.0)
HEMOGLOBIN: 14.8 g/dL (ref 12.0–15.0)
Lymphocytes Relative: 30.1 % (ref 12.0–46.0)
Lymphs Abs: 2.6 10*3/uL (ref 0.7–4.0)
MCHC: 33.9 g/dL (ref 30.0–36.0)
MCV: 91 fl (ref 78.0–100.0)
Monocytes Absolute: 0.5 10*3/uL (ref 0.1–1.0)
Monocytes Relative: 6.3 % (ref 3.0–12.0)
NEUTROS ABS: 5.3 10*3/uL (ref 1.4–7.7)
Neutrophils Relative %: 62 % (ref 43.0–77.0)
PLATELETS: 288 10*3/uL (ref 150.0–400.0)
RBC: 4.8 Mil/uL (ref 3.87–5.11)
RDW: 13.1 % (ref 11.5–15.5)
WBC: 8.5 10*3/uL (ref 4.0–10.5)

## 2016-04-01 LAB — BASIC METABOLIC PANEL
BUN: 16 mg/dL (ref 6–23)
CALCIUM: 9.9 mg/dL (ref 8.4–10.5)
CO2: 30 mEq/L (ref 19–32)
CREATININE: 0.7 mg/dL (ref 0.40–1.20)
Chloride: 101 mEq/L (ref 96–112)
GFR: 94.67 mL/min (ref >60.00)
Glucose, Bld: 97 mg/dL (ref 70–99)
POTASSIUM: 4.2 meq/L (ref 3.5–5.1)
Sodium: 138 mEq/L (ref 135–145)

## 2016-04-01 LAB — HEPATIC FUNCTION PANEL
ALBUMIN: 4.5 g/dL (ref 3.5–5.2)
ALT: 36 U/L — ABNORMAL HIGH (ref 0–35)
AST: 25 U/L (ref 0–37)
Alkaline Phosphatase: 66 U/L (ref 39–117)
Bilirubin, Direct: 0.1 mg/dL (ref 0.0–0.3)
Total Bilirubin: 0.5 mg/dL (ref 0.2–1.2)
Total Protein: 7.5 g/dL (ref 6.0–8.3)

## 2016-04-01 LAB — LIPID PANEL
CHOL/HDL RATIO: 3
CHOLESTEROL: 214 mg/dL — AB (ref 0–200)
HDL: 61.5 mg/dL (ref >39.00)
LDL Cholesterol: 128 mg/dL — ABNORMAL HIGH (ref 0–99)
NonHDL: 152.13
TRIGLYCERIDES: 121 mg/dL (ref 0.0–149.0)
VLDL: 24.2 mg/dL (ref 0.0–40.0)

## 2016-04-01 LAB — TSH: TSH: 2.77 u[IU]/mL (ref 0.35–4.50)

## 2016-04-01 LAB — HEMOGLOBIN A1C: Hgb A1c MFr Bld: 5.9 % (ref 4.6–6.5)

## 2016-04-01 NOTE — Telephone Encounter (Signed)
FYI - Pt wanted to let Dr. Nicki Reaper to know that she has been decreasing her lisinopril to a 1/2 tablet. Pt has an appt on 2/13. If she has any questions to call. [phone]

## 2016-04-01 NOTE — Telephone Encounter (Signed)
Will need to monitor blood pressure and let us know if any problems.

## 2016-04-01 NOTE — Telephone Encounter (Signed)
Please advise 

## 2016-04-04 ENCOUNTER — Encounter: Payer: Self-pay | Admitting: Internal Medicine

## 2016-04-04 DIAGNOSIS — L821 Other seborrheic keratosis: Secondary | ICD-10-CM | POA: Diagnosis not present

## 2016-04-04 DIAGNOSIS — L308 Other specified dermatitis: Secondary | ICD-10-CM | POA: Diagnosis not present

## 2016-04-04 DIAGNOSIS — L820 Inflamed seborrheic keratosis: Secondary | ICD-10-CM | POA: Diagnosis not present

## 2016-04-04 DIAGNOSIS — L720 Epidermal cyst: Secondary | ICD-10-CM | POA: Diagnosis not present

## 2016-04-04 DIAGNOSIS — L918 Other hypertrophic disorders of the skin: Secondary | ICD-10-CM | POA: Diagnosis not present

## 2016-04-05 ENCOUNTER — Encounter: Payer: Self-pay | Admitting: Internal Medicine

## 2016-04-05 ENCOUNTER — Ambulatory Visit (INDEPENDENT_AMBULATORY_CARE_PROVIDER_SITE_OTHER): Payer: BLUE CROSS/BLUE SHIELD | Admitting: Internal Medicine

## 2016-04-05 VITALS — BP 126/84 | HR 88 | Temp 98.6°F | Resp 16 | Ht 65.0 in | Wt 248.8 lb

## 2016-04-05 DIAGNOSIS — K7689 Other specified diseases of liver: Secondary | ICD-10-CM

## 2016-04-05 DIAGNOSIS — I10 Essential (primary) hypertension: Secondary | ICD-10-CM

## 2016-04-05 DIAGNOSIS — R945 Abnormal results of liver function studies: Secondary | ICD-10-CM

## 2016-04-05 DIAGNOSIS — M5126 Other intervertebral disc displacement, lumbar region: Secondary | ICD-10-CM | POA: Diagnosis not present

## 2016-04-05 DIAGNOSIS — E7800 Pure hypercholesterolemia, unspecified: Secondary | ICD-10-CM | POA: Diagnosis not present

## 2016-04-05 DIAGNOSIS — E669 Obesity, unspecified: Secondary | ICD-10-CM

## 2016-04-05 DIAGNOSIS — R739 Hyperglycemia, unspecified: Secondary | ICD-10-CM

## 2016-04-05 DIAGNOSIS — R7989 Other specified abnormal findings of blood chemistry: Secondary | ICD-10-CM | POA: Diagnosis not present

## 2016-04-05 MED ORDER — LISINOPRIL 5 MG PO TABS: 5.0000 mg | ORAL_TABLET | Freq: Every day | ORAL | 1 refills | Status: DC

## 2016-04-05 NOTE — Telephone Encounter (Signed)
Will discuss with Dr. Nicki Reaper at office visit today

## 2016-04-05 NOTE — Progress Notes (Signed)
Pre-visit discussion using our clinic review tool. No additional management support is needed unless otherwise documented below in the visit note.  

## 2016-04-05 NOTE — Progress Notes (Signed)
Patient ID: Joy Houston, female   DOB: 05/31/1965, 50 y.o.   MRN: [id number]   Subjective:    Patient ID: Joy Houston, female    DOB: 01/13/1966, 50 y.o.   MRN: [id number]  HPI  Patient here for a scheduled follow up.  She has just started back to [employer].  Plans to start exercising more.  Just had another back procedure.  Back to work 03/08/16.  Doing better.  Pain better.  Has gained weight.  No chest pain.  No sob.  No acid reflux.  No abdominal pain or cramping.  Bowels stable.  Taking 5mg of lisinopril daily.  Blood pressure doing better.     Past Medical History:  Diagnosis Date  . Complication of anesthesia   . Eczema   . Family history of anesthesia complication    Mother - confusion  . Fatty liver   . Heart murmur    Slight - "nothing to worry about"  . Hemorrhoid   . Hypertension   . PONV (postoperative nausea and vomiting)    Past Surgical History:  Procedure Laterality Date  . CESAREAN SECTION    . FINGER SURGERY     pin to 3rd finger Right hand  . LUMBAR LAMINECTOMY/DECOMPRESSION MICRODISCECTOMY  12/15/2011   Procedure: LUMBAR LAMINECTOMY/DECOMPRESSION MICRODISCECTOMY 1 LEVEL;  Surgeon: Jeffrey D Jenkins, MD;  Location: [facility];  Service: Neurosurgery;  Laterality: Right;  RIGHT Lumbar four-Five diskectomy  . WISDOM TOOTH EXTRACTION     Family History  Problem Relation Age of Onset  . GER disease Mother   . Diabetes Father   . Cancer Father     Melanoma, Prostate  . Heart disease Father   . Heart disease Maternal Grandfather   . Diabetes Paternal Grandfather   . Breast cancer Sister 54   Social History   Social History  . Marital status: Married    Spouse name: N/A  . Number of children: 2  . Years of education: N/A   Occupational History  .  [employer]   Social History Main Topics  . Smoking status: Never Smoker  . Smokeless tobacco: Never Used  . Alcohol use 0.0 oz/week     Comment: rare  . Drug use: No  . Sexual  activity: Not Asked   Other Topics Concern  . None   Social History Narrative  . None    Outpatient Encounter Prescriptions as of 04/05/2016  Medication Sig  . hydrochlorothiazide (HYDRODIURIL) 25 MG tablet Take 1 tablet (25 mg total) by mouth daily.  . [DISCONTINUED] lisinopril (PRINIVIL,ZESTRIL) 10 MG tablet Take 1 tablet (10 mg total) by mouth daily. (Patient taking differently: Take 10 mg by mouth daily. )  . lisinopril (PRINIVIL,ZESTRIL) 5 MG tablet Take 1 tablet (5 mg total) by mouth daily.  . [DISCONTINUED] diazepam (VALIUM) 5 MG tablet Take 1 tablet (5 mg total) by mouth every 8 (eight) hours as needed for muscle spasms (Do Not drive while taking this medication). (Patient not taking: Reported on 04/05/2016)  . [DISCONTINUED] ibuprofen (ADVIL,MOTRIN) 800 MG tablet One tablet q 8-12 hours prn (Patient not taking: Reported on 04/05/2016)  . [DISCONTINUED] ketorolac (TORADOL) 10 MG tablet Take 1 tablet (10 mg total) by mouth every 6 (six) hours as needed for moderate pain or severe pain. (Patient not taking: Reported on 04/05/2016)   No facility-administered encounter medications on file as of 04/05/2016.     Review of Systems  Constitutional: Negative for appetite change and unexpected weight   change.  HENT: Negative for congestion and sinus pressure.   Respiratory: Negative for cough, chest tightness and shortness of breath.   Cardiovascular: Negative for chest pain, palpitations and leg swelling.  Gastrointestinal: Negative for abdominal pain, diarrhea, nausea and vomiting.  Genitourinary: Negative for difficulty urinating and dysuria.  Musculoskeletal: Negative for joint swelling.       Back is better s/p procedure.    Skin: Negative for color change and rash.  Neurological: Negative for dizziness, light-headedness and headaches.  Psychiatric/Behavioral: Negative for agitation and dysphoric mood.       Objective:     Blood pressure rechecked by me:  118/72  Physical Exam    Constitutional: She appears well-developed and well-nourished. No distress.  HENT:  Nose: Nose normal.  Mouth/Throat: Oropharynx is clear and moist.  Neck: Neck supple. No thyromegaly present.  Cardiovascular: Normal rate and regular rhythm.   Pulmonary/Chest: Breath sounds normal. No respiratory distress. She has no wheezes.  Abdominal: Soft. Bowel sounds are normal. There is no tenderness.  Musculoskeletal: She exhibits no edema or tenderness.  Lymphadenopathy:    She has no cervical adenopathy.  Skin: No rash noted. No erythema.  Psychiatric: She has a normal mood and affect. Her behavior is normal.    BP 126/84 (BP Location: Left Arm, Patient Position: Sitting, Cuff Size: Large)   Pulse 88   Temp 98.6 F (37 C) (Oral)   Resp 16   Ht 5' 5" (1.651 m)   Wt 248 lb 12.8 oz (112.9 kg)   SpO2 97%   BMI 41.40 kg/m  Wt Readings from Last 3 Encounters:  04/05/16 248 lb 12.8 oz (112.9 kg)  12/25/15 237 lb 12.8 oz (107.9 kg)  10/31/15 225 lb (102.1 kg)     Lab Results  Component Value Date   WBC 8.5 04/01/2016   HGB 14.8 04/01/2016   HCT 43.6 04/01/2016   PLT 288.0 04/01/2016   GLUCOSE 97 04/01/2016   CHOL 214 (H) 04/01/2016   TRIG 121.0 04/01/2016   HDL 61.50 04/01/2016   LDLDIRECT 145.4 03/01/2013   LDLCALC 128 (H) 04/01/2016   ALT 36 (H) 04/01/2016   AST 25 04/01/2016   NA 138 04/01/2016   K 4.2 04/01/2016   CL 101 04/01/2016   CREATININE 0.70 04/01/2016   BUN 16 04/01/2016   CO2 30 04/01/2016   TSH 2.77 04/01/2016   HGBA1C 5.9 04/01/2016   MICROALBUR 0.9 05/06/2014    Mr Lumbar Spine Wo Contrast  Result Date: 10/31/2015 CLINICAL DATA:  Left side lumbar pain. History of back surgery in 2013. EXAM: MRI LUMBAR SPINE WITHOUT CONTRAST TECHNIQUE: Multiplanar, multisequence MR imaging of the lumbar spine was performed. No intravenous contrast was administered. COMPARISON:  11/29/2011 FINDINGS: Segmentation:  Standard. Alignment:  Physiologic. Vertebrae:  No fracture,  evidence of discitis, or bone lesion. Conus medullaris: Extends to the L1 level and appears normal. Paraspinal and other soft tissues: No focal paraspinal abnormality. Disc levels: Disc spaces: Disc desiccation at L2-3, L3-4, L4-5 and L5-S1. T12-L1: Shallow right paracentral disc protrusion. No evidence of neural foraminal stenosis. No central canal stenosis. L1-L2: No significant disc bulge. No evidence of neural foraminal stenosis. No central canal stenosis. L2-L3: Posterior annular fissure. No evidence of neural foraminal stenosis. No central canal stenosis. L3-L4: Small posterior annular fissure. No disc protrusion. Bilateral lateral recess narrowing. No evidence of neural foraminal stenosis. No central canal stenosis. L4-L5: Central disc protrusion with disc material migrating along the left paracentral aspect with severe narrowing of  the left lateral recess and mass effect on the left intraspinal L5 nerve root. Right lateral recess stenosis. Mild bilateral facet arthropathy. Mild spinal stenosis. L5-S1: Broad-based disc bulge. No evidence of neural foraminal stenosis. No central canal stenosis. IMPRESSION: 1. At L4-5 there is a central disc protrusion with disc material migrating along the left paracentral aspect with severe narrowing of the left lateral recess and mass effect on the left intraspinal L5 nerve root. Right lateral recess stenosis. Mild bilateral facet arthropathy. Mild spinal stenosis. Electronically Signed   By: Kathreen Devoid   On: 10/31/2015 08:55       Assessment & Plan:   Problem List Items Addressed This Visit    Abnormal liver function    Diet, exercise and weight loss.  Previous ultrasound revealed fatty liver.  Follow liver panel.        Hypercholesterolemia    Low cholesterol diet and exercise.  Follow lipid panel.       Relevant Medications   lisinopril (PRINIVIL,ZESTRIL) 5 MG tablet   Hyperglycemia    Low carb diet and exercise.  Follow met b and a1c.   Lab Results    Component Value Date   HGBA1C 5.9 04/01/2016        Hypertension    On lisinopril 28m q day.  Blood pressure doing well.  Follow.        Relevant Medications   lisinopril (PRINIVIL,ZESTRIL) 5 MG tablet   Lumbar herniated disc    S/p recent back procedure.  Doing better.  Follow.        Obesity (BMI 30-39.9)    Discussed diet and exercise.  She has joined [employer]  Follow.         Other Visit Diagnoses    Abnormal liver function test    -  Primary   Relevant Orders   Hepatic function panel       SEinar Pheasant MD

## 2016-04-17 ENCOUNTER — Encounter: Payer: Self-pay | Admitting: Internal Medicine

## 2016-04-17 NOTE — Assessment & Plan Note (Signed)
Diet, exercise and weight loss.  Previous ultrasound revealed fatty liver.  Follow liver panel.

## 2016-04-17 NOTE — Assessment & Plan Note (Signed)
On lisinopril 5mg  q day.  Blood pressure doing well.  Follow.

## 2016-04-17 NOTE — Assessment & Plan Note (Signed)
Discussed diet and exercise.  She has joined [employer].  Follow.

## 2016-04-17 NOTE — Assessment & Plan Note (Signed)
Low cholesterol diet and exercise.  Follow lipid panel.   

## 2016-04-17 NOTE — Assessment & Plan Note (Signed)
Low carb diet and exercise.  Follow met b and a1c.   Lab Results  Component Value Date   HGBA1C 5.9 04/01/2016   

## 2016-04-17 NOTE — Assessment & Plan Note (Signed)
S/p recent back procedure.  Doing better.  Follow.

## 2016-05-12 ENCOUNTER — Encounter: Payer: Self-pay | Admitting: Internal Medicine

## 2016-05-16 ENCOUNTER — Other Ambulatory Visit: Payer: BLUE CROSS/BLUE SHIELD

## 2016-06-14 ENCOUNTER — Encounter: Payer: Self-pay | Admitting: Internal Medicine

## 2016-06-28 DIAGNOSIS — L720 Epidermal cyst: Secondary | ICD-10-CM | POA: Diagnosis not present

## 2016-07-05 DIAGNOSIS — L720 Epidermal cyst: Secondary | ICD-10-CM | POA: Diagnosis not present

## 2016-07-07 ENCOUNTER — Ambulatory Visit (INDEPENDENT_AMBULATORY_CARE_PROVIDER_SITE_OTHER): Payer: BLUE CROSS/BLUE SHIELD | Admitting: Internal Medicine

## 2016-07-07 ENCOUNTER — Encounter: Payer: Self-pay | Admitting: Internal Medicine

## 2016-07-07 DIAGNOSIS — R945 Abnormal results of liver function studies: Secondary | ICD-10-CM

## 2016-07-07 DIAGNOSIS — R739 Hyperglycemia, unspecified: Secondary | ICD-10-CM | POA: Diagnosis not present

## 2016-07-07 DIAGNOSIS — I10 Essential (primary) hypertension: Secondary | ICD-10-CM | POA: Diagnosis not present

## 2016-07-07 DIAGNOSIS — K7689 Other specified diseases of liver: Secondary | ICD-10-CM

## 2016-07-07 DIAGNOSIS — E669 Obesity, unspecified: Secondary | ICD-10-CM

## 2016-07-07 DIAGNOSIS — E7800 Pure hypercholesterolemia, unspecified: Secondary | ICD-10-CM | POA: Diagnosis not present

## 2016-07-07 MED ORDER — MUPIROCIN 2 % EX OINT: 1.0000 "application " | TOPICAL_OINTMENT | Freq: Two times a day (BID) | CUTANEOUS | 0 refills | Status: DC

## 2016-07-07 MED ORDER — HYDROCHLOROTHIAZIDE 12.5 MG PO CAPS: ORAL_CAPSULE | ORAL | 3 refills | Status: DC

## 2016-07-07 NOTE — Progress Notes (Signed)
Pre-visit discussion using our clinic review tool. No additional management support is needed unless otherwise documented below in the visit note.  

## 2016-07-07 NOTE — Progress Notes (Signed)
Patient ID: Joy Houston, female   DOB: Jul 08, 1965, 51 y.o.   MRN: [id number]   Subjective:    Patient ID: Joy Houston, female    DOB: May 28, 1965, 51 y.o.   MRN: [id number]  HPI  Patient here for a scheduled follow up.  She reports she is doing well.  Started Weight [employer]chers.  Has lost some weight.  We discussed diet and exercise.  No chest pain. No sob.  No acid reflux.  No abdominal pain.  Bowels stable.  No urine change.  Wants to decrease her blood pressure medication.  Blood pressure doing well.     Past Medical History:  Diagnosis Date  . Complication of anesthesia   . Eczema   . Family history of anesthesia complication    Mother - confusion  . Fatty liver   . Heart murmur    Slight - "nothing to worry about"  . Hemorrhoid   . Hypertension   . PONV (postoperative nausea and vomiting)    Past Surgical History:  Procedure Laterality Date  . CESAREAN SECTION    . FINGER SURGERY     pin to 3rd finger Right hand  . LUMBAR LAMINECTOMY/DECOMPRESSION MICRODISCECTOMY  12/15/2011   Procedure: LUMBAR LAMINECTOMY/DECOMPRESSION MICRODISCECTOMY 1 LEVEL;  Surgeon: Ophelia Charter, MD;  Location: [facility];  Service: Neurosurgery;  Laterality: Right;  RIGHT Lumbar four-Five diskectomy  . WISDOM TOOTH EXTRACTION     Family History  Problem Relation Age of Onset  . GER disease Mother   . Diabetes Father   . Cancer Father        Melanoma, Prostate  . Heart disease Father   . Heart disease Maternal Grandfather   . Diabetes Paternal Grandfather   . Breast cancer Sister 26   Social History   Social History  . Marital status: Married    Spouse name: N/A  . Number of children: 2  . Years of education: N/A   Occupational History  .  [employer]   Social History Main Topics  . Smoking status: Never Smoker  . Smokeless tobacco: Never Used  . Alcohol use 0.0 oz/week     Comment: rare  . Drug use: No  . Sexual activity: Not Asked   Other Topics Concern  .  None   Social History Narrative  . None    Outpatient Encounter Prescriptions as of 07/07/2016  Medication Sig  . lisinopril (PRINIVIL,ZESTRIL) 5 MG tablet Take 1 tablet (5 mg total) by mouth daily.  . [DISCONTINUED] hydrochlorothiazide (HYDRODIURIL) 25 MG tablet Take 1 tablet (25 mg total) by mouth daily.  . hydrochlorothiazide (MICROZIDE) 12.5 MG capsule Take 1-2 tablets q day  . mupirocin ointment (BACTROBAN) 2 % Place 1 application into the nose 2 (two) times daily.   No facility-administered encounter medications on file as of 07/07/2016.     Review of Systems  Constitutional: Negative for appetite change and unexpected weight change.  HENT: Negative for congestion and sinus pressure.   Respiratory: Negative for cough, chest tightness and shortness of breath.   Cardiovascular: Negative for chest pain, palpitations and leg swelling.  Gastrointestinal: Negative for abdominal pain, diarrhea, nausea and vomiting.  Genitourinary: Negative for difficulty urinating and dysuria.  Musculoskeletal: Negative for joint swelling and myalgias.  Skin: Negative for color change and rash.  Neurological: Negative for dizziness, light-headedness and headaches.  Psychiatric/Behavioral: Negative for agitation and dysphoric mood.       Objective:    Physical Exam  Constitutional: She  appears well-developed and well-nourished. No distress.  HENT:  Nose: Nose normal.  Mouth/Throat: Oropharynx is clear and moist.  Neck: Neck supple. No thyromegaly present.  Cardiovascular: Normal rate and regular rhythm.   Pulmonary/Chest: Breath sounds normal. No respiratory distress. She has no wheezes.  Abdominal: Soft. Bowel sounds are normal. There is no tenderness.  Musculoskeletal: She exhibits no edema or tenderness.  Lymphadenopathy:    She has no cervical adenopathy.  Skin: No rash noted. No erythema.  Psychiatric: She has a normal mood and affect. Her behavior is normal.    BP 120/70 (BP  Location: Left Arm, Patient Position: Sitting, Cuff Size: Large)   Pulse 61   Temp 99 F (37.2 C) (Oral)   Resp 12   Ht 5' 5"  (1.651 m)   Wt 241 lb 3.2 oz (109.4 kg)   SpO2 99%   BMI 40.14 kg/m  Wt Readings from Last 3 Encounters:  07/07/16 241 lb 3.2 oz (109.4 kg)  04/05/16 248 lb 12.8 oz (112.9 kg)  12/25/15 237 lb 12.8 oz (107.9 kg)     Lab Results  Component Value Date   WBC 8.5 04/01/2016   HGB 14.8 04/01/2016   HCT 43.6 04/01/2016   PLT 288.0 04/01/2016   GLUCOSE 97 04/01/2016   CHOL 214 (H) 04/01/2016   TRIG 121.0 04/01/2016   HDL 61.50 04/01/2016   LDLDIRECT 145.4 03/01/2013   LDLCALC 128 (H) 04/01/2016   ALT 36 (H) 04/01/2016   AST 25 04/01/2016   NA 138 04/01/2016   K 4.2 04/01/2016   CL 101 04/01/2016   CREATININE 0.70 04/01/2016   BUN 16 04/01/2016   CO2 30 04/01/2016   TSH 2.77 04/01/2016   HGBA1C 5.9 04/01/2016   MICROALBUR 0.9 05/06/2014    Mr Lumbar Spine Wo Contrast  Result Date: 10/31/2015 CLINICAL DATA:  Left side lumbar pain. History of back surgery in 2013. EXAM: MRI LUMBAR SPINE WITHOUT CONTRAST TECHNIQUE: Multiplanar, multisequence MR imaging of the lumbar spine was performed. No intravenous contrast was administered. COMPARISON:  11/29/2011 FINDINGS: Segmentation:  Standard. Alignment:  Physiologic. Vertebrae:  No fracture, evidence of discitis, or bone lesion. Conus medullaris: Extends to the L1 level and appears normal. Paraspinal and other soft tissues: No focal paraspinal abnormality. Disc levels: Disc spaces: Disc desiccation at L2-3, L3-4, L4-5 and L5-S1. T12-L1: Shallow right paracentral disc protrusion. No evidence of neural foraminal stenosis. No central canal stenosis. L1-L2: No significant disc bulge. No evidence of neural foraminal stenosis. No central canal stenosis. L2-L3: Posterior annular fissure. No evidence of neural foraminal stenosis. No central canal stenosis. L3-L4: Small posterior annular fissure. No disc protrusion. Bilateral  lateral recess narrowing. No evidence of neural foraminal stenosis. No central canal stenosis. L4-L5: Central disc protrusion with disc material migrating along the left paracentral aspect with severe narrowing of the left lateral recess and mass effect on the left intraspinal L5 nerve root. Right lateral recess stenosis. Mild bilateral facet arthropathy. Mild spinal stenosis. L5-S1: Broad-based disc bulge. No evidence of neural foraminal stenosis. No central canal stenosis. IMPRESSION: 1. At L4-5 there is a central disc protrusion with disc material migrating along the left paracentral aspect with severe narrowing of the left lateral recess and mass effect on the left intraspinal L5 nerve root. Right lateral recess stenosis. Mild bilateral facet arthropathy. Mild spinal stenosis. Electronically Signed   By: Kathreen Devoid   On: 10/31/2015 08:55       Assessment & Plan:   Problem List Items Addressed This  Visit    Abnormal liver function    Previous ultrasound revealed fatty liver.  Discussed diet, exercise and weight loss.        Hypercholesterolemia    Low cholesterol diet and exercise.  Follow lipid panel.        Relevant Medications   hydrochlorothiazide (MICROZIDE) 12.5 MG capsule   Other Relevant Orders   Hepatic function panel   Lipid panel   Hyperglycemia    Low carb diet and exercise.  Follow met b and a1c.        Relevant Orders   Hemoglobin A1c   Hypertension    Blood pressure as outlined.  Decrease hctz to 12.65m q day.  Follow pressures.  Follow metabolic panel.        Relevant Medications   hydrochlorothiazide (MICROZIDE) 12.5 MG capsule   Other Relevant Orders   Basic metabolic panel   Obesity (BMI 30-39.9)    Diet and exercise.  Follow.            SEinar Pheasant MD

## 2016-07-18 ENCOUNTER — Encounter: Payer: Self-pay | Admitting: Internal Medicine

## 2016-07-18 NOTE — Assessment & Plan Note (Signed)
Low cholesterol diet and exercise.  Follow lipid panel.   

## 2016-07-18 NOTE — Assessment & Plan Note (Signed)
Previous ultrasound revealed fatty liver.  Discussed diet, exercise and weight loss.

## 2016-07-18 NOTE — Assessment & Plan Note (Signed)
Diet and exercise.  Follow.  

## 2016-07-18 NOTE — Assessment & Plan Note (Signed)
Low carb diet and exercise.  Follow met b and a1c.   

## 2016-07-18 NOTE — Assessment & Plan Note (Signed)
Blood pressure as outlined.  Decrease hctz to 12.5mg  q day.  Follow pressures.  Follow metabolic panel.

## 2016-08-31 ENCOUNTER — Other Ambulatory Visit: Payer: Self-pay | Admitting: Internal Medicine

## 2016-09-05 ENCOUNTER — Encounter: Payer: Self-pay | Admitting: *Deleted

## 2016-09-05 ENCOUNTER — Other Ambulatory Visit: Payer: Self-pay | Admitting: Internal Medicine

## 2016-09-08 ENCOUNTER — Other Ambulatory Visit: Payer: Self-pay | Admitting: *Deleted

## 2016-10-06 ENCOUNTER — Other Ambulatory Visit: Payer: Self-pay | Admitting: Internal Medicine

## 2016-10-06 DIAGNOSIS — Z1231 Encounter for screening mammogram for malignant neoplasm of breast: Secondary | ICD-10-CM

## 2016-10-10 ENCOUNTER — Other Ambulatory Visit: Payer: Self-pay | Admitting: Internal Medicine

## 2016-10-11 ENCOUNTER — Encounter: Payer: Self-pay | Admitting: Internal Medicine

## 2016-11-03 ENCOUNTER — Encounter: Payer: Self-pay | Admitting: Internal Medicine

## 2016-11-21 ENCOUNTER — Ambulatory Visit
Admission: RE | Admit: 2016-11-21 | Discharge: 2016-11-21 | Disposition: A | Discharge status: Home / Self Care | Payer: BLUE CROSS/BLUE SHIELD | Source: Ambulatory Visit | Attending: Internal Medicine | Admitting: Internal Medicine

## 2016-11-21 DIAGNOSIS — Z1231 Encounter for screening mammogram for malignant neoplasm of breast: Secondary | ICD-10-CM | POA: Diagnosis not present

## 2016-11-23 ENCOUNTER — Encounter: Payer: Self-pay | Admitting: Internal Medicine

## 2016-11-23 NOTE — Telephone Encounter (Signed)
This message is regarding pts son Guerline Happ).  Can call and see if he wants/needs to be seen.  I am off this pm.  I can add him at lunch.  Can come in at 1:00.  May have to wait.

## 2016-11-23 NOTE — Telephone Encounter (Signed)
Noted  

## 2016-11-25 ENCOUNTER — Encounter: Payer: Self-pay | Admitting: Internal Medicine

## 2016-11-25 NOTE — Telephone Encounter (Signed)
I am ok to see her husband.  Do you mind calling and getting his information and scheduling.  Thanks.

## 2017-01-03 ENCOUNTER — Encounter: Payer: Self-pay | Admitting: Internal Medicine

## 2017-01-06 NOTE — Telephone Encounter (Signed)
Notified pts mother - have requested lab results

## 2017-02-03 ENCOUNTER — Encounter: Payer: Self-pay | Admitting: Internal Medicine

## 2017-02-03 ENCOUNTER — Ambulatory Visit: Payer: BLUE CROSS/BLUE SHIELD | Admitting: Internal Medicine

## 2017-02-03 DIAGNOSIS — R945 Abnormal results of liver function studies: Secondary | ICD-10-CM

## 2017-02-03 DIAGNOSIS — R739 Hyperglycemia, unspecified: Secondary | ICD-10-CM

## 2017-02-03 DIAGNOSIS — I10 Essential (primary) hypertension: Secondary | ICD-10-CM | POA: Diagnosis not present

## 2017-02-03 DIAGNOSIS — E7800 Pure hypercholesterolemia, unspecified: Secondary | ICD-10-CM | POA: Diagnosis not present

## 2017-02-03 DIAGNOSIS — E669 Obesity, unspecified: Secondary | ICD-10-CM

## 2017-02-03 DIAGNOSIS — K7689 Other specified diseases of liver: Secondary | ICD-10-CM

## 2017-02-03 NOTE — Progress Notes (Signed)
Patient ID: NEESA KNAPIK, female   DOB: 1965/07/04, 51 y.o.   MRN: [id number]   Subjective:    Patient ID: [name], female    DOB: [birth year], 51 y.o.   MRN: [id number]  HPI  Patient here for a scheduled follow up.  She reports she is doing relatively well.  Not exercising.  Not watching her diet.  Discussed with her today.  She plans to start doing better - watching her diet and exercising more.  No chest pain.  No sob.  States noticed sore throat yesterday.  Better today.  No sinus pressure.  No fever.  No chest congestion or cough.  No acid reflux.  No abdominal pain.  Bowels moving.     Past Medical History:  Diagnosis Date  . Complication of anesthesia   . Eczema   . Family history of anesthesia complication    Mother - confusion  . Fatty liver   . Heart murmur    Slight - "nothing to worry about"  . Hemorrhoid   . Hypertension   . PONV (postoperative nausea and vomiting)    Past Surgical History:  Procedure Laterality Date  . CESAREAN SECTION    . FINGER SURGERY     pin to 3rd finger Right hand  . LUMBAR LAMINECTOMY/DECOMPRESSION MICRODISCECTOMY  12/15/2011   Procedure: LUMBAR LAMINECTOMY/DECOMPRESSION MICRODISCECTOMY 1 LEVEL;  Surgeon: Ophelia Charter, MD;  Location: [facility];  Service: Neurosurgery;  Laterality: Right;  RIGHT Lumbar four-Five diskectomy  . WISDOM TOOTH EXTRACTION     Family History  Problem Relation Age of Onset  . GER disease Mother   . Diabetes Father   . Cancer Father        Melanoma, Prostate  . Heart disease Father   . Heart disease Maternal Grandfather   . Diabetes Paternal Grandfather   . Breast cancer Sister 46   Social History   Socioeconomic History  . Marital status: Married    Spouse name: None  . Number of children: 2  . Years of education: None  . Highest education level: None  Social Needs  . Financial resource strain: None  . Food insecurity - worry: None  . Food insecurity - inability: None  .  Transportation needs - medical: None  . Transportation needs - non-medical: None  Occupational History    Employer: [employer]  Tobacco Use  . Smoking status: Never Smoker  . Smokeless tobacco: Never Used  Substance and Sexual Activity  . Alcohol use: Yes    Alcohol/week: 0.0 oz    Comment: rare  . Drug use: No  . Sexual activity: None  Other Topics Concern  . None  Social History Narrative  . None    Outpatient Encounter Medications as of 02/03/2017  Medication Sig  . hydrochlorothiazide (MICROZIDE) 12.5 MG capsule TAKE 1 TO 2 CAPSULES BY MOUTH EVERY DAY  . lisinopril (PRINIVIL,ZESTRIL) 5 MG tablet TAKE 1 TABLET (5 MG TOTAL) BY MOUTH DAILY.  . mupirocin ointment (BACTROBAN) 2 % Place 1 application into the nose 2 (two) times daily.   No facility-administered encounter medications on file as of 02/03/2017.     Review of Systems  Constitutional: Negative for appetite change and unexpected weight change.  HENT: Negative for congestion and sinus pressure.   Respiratory: Negative for cough, chest tightness and shortness of breath.   Cardiovascular: Negative for chest pain, palpitations and leg swelling.  Gastrointestinal: Negative for abdominal pain, diarrhea, nausea and vomiting.  Genitourinary: Negative  for difficulty urinating and dysuria.  Musculoskeletal: Negative for joint swelling and myalgias.  Skin: Negative for color change and rash.  Neurological: Negative for dizziness, light-headedness and headaches.  Psychiatric/Behavioral: Negative for agitation and dysphoric mood.       Objective:     Blood pressure rechecked by me:  122/82  Physical Exam  Constitutional: She appears well-developed and well-nourished. No distress.  HENT:  Nose: Nose normal.  Mouth/Throat: Oropharynx is clear and moist.  Neck: Neck supple. No thyromegaly present.  Cardiovascular: Normal rate and regular rhythm.  Pulmonary/Chest: Breath sounds normal. No respiratory distress. She  has no wheezes.  Abdominal: Soft. Bowel sounds are normal. There is no tenderness.  Musculoskeletal: She exhibits no edema or tenderness.  Lymphadenopathy:    She has no cervical adenopathy.  Skin: No rash noted. No erythema.  Psychiatric: She has a normal mood and affect. Her behavior is normal.    BP 122/82   Pulse 86   Temp 98.4 F (36.9 C) (Oral)   Ht _0  (1.651 m)   Wt 258 lb 12.8 oz (117.4 kg)   SpO2 96%   BMI 43.07 kg/m  Wt Readings from Last 3 Encounters:  02/03/17 258 lb 12.8 oz (117.4 kg)  07/07/16 241 lb 3.2 oz (109.4 kg)  04/05/16 248 lb 12.8 oz (112.9 kg)     Lab Results  Component Value Date   WBC 8.5 04/01/2016   HGB 14.8 04/01/2016   HCT 43.6 04/01/2016   PLT 288.0 04/01/2016   GLUCOSE 97 04/01/2016   CHOL 214 (H) 04/01/2016   TRIG 121.0 04/01/2016   HDL 61.50 04/01/2016   LDLDIRECT 145.4 03/01/2013   LDLCALC 128 (H) 04/01/2016   ALT 36 (H) 04/01/2016   AST 25 04/01/2016   NA 138 04/01/2016   K 4.2 04/01/2016   CL 101 04/01/2016   CREATININE 0.70 04/01/2016   BUN 16 04/01/2016   CO2 30 04/01/2016   TSH 2.77 04/01/2016   HGBA1C 5.9 04/01/2016   MICROALBUR 0.9 05/06/2014    Mm Screening Breast Tomo Bilateral  Result Date: 11/22/2016 CLINICAL DATA:  Screening. EXAM: 2D DIGITAL SCREENING BILATERAL MAMMOGRAM WITH CAD AND ADJUNCT TOMO COMPARISON:  Previous exam(s). ACR Breast Density Category b: There are scattered areas of fibroglandular density. FINDINGS: There are no findings suspicious for malignancy. Images were processed with CAD. IMPRESSION: No mammographic evidence of malignancy. A result letter of this screening mammogram will be mailed directly to the patient. RECOMMENDATION: Screening mammogram in one year. (Code:SM-B-01Y) BI-RADS CATEGORY  1: Negative. Electronically Signed   By: Evangeline Dakin M.D.   On: 11/22/2016 09:00       Assessment & Plan:   Problem List Items Addressed This Visit    Abnormal liver function    Previous  ultrasound - fatty liver.  Discussed diet, exercise and weight loss.        Hypercholesterolemia    Low cholesterol diet and exercise.  Follow lipid panel.       Hyperglycemia    Low carb diet and exercise.  Follow met b and a1c.        Hypertension    Blood pressure on recheck improved.  Same medication regimen.  Follow pressures.  Follow metabolic panel.        Obesity (BMI 30-39.9)    Discussed diet and exercise.  Follow.           Einar Pheasant, MD

## 2017-02-05 ENCOUNTER — Encounter: Payer: Self-pay | Admitting: Internal Medicine

## 2017-02-05 NOTE — Assessment & Plan Note (Signed)
Blood pressure on recheck improved.  Same medication regimen.  Follow pressures.  Follow metabolic panel.   

## 2017-02-05 NOTE — Assessment & Plan Note (Signed)
Discussed diet and exercise.  Follow.  

## 2017-02-05 NOTE — Assessment & Plan Note (Signed)
Low carb diet and exercise.  Follow met b and a1c.   

## 2017-02-05 NOTE — Assessment & Plan Note (Signed)
Low cholesterol diet and exercise.  Follow lipid panel.   

## 2017-02-05 NOTE — Assessment & Plan Note (Signed)
Previous ultrasound - fatty liver.  Discussed diet, exercise and weight loss.

## 2017-02-25 ENCOUNTER — Other Ambulatory Visit: Payer: Self-pay

## 2017-02-25 ENCOUNTER — Ambulatory Visit (INDEPENDENT_AMBULATORY_CARE_PROVIDER_SITE_OTHER): Payer: BLUE CROSS/BLUE SHIELD

## 2017-02-25 ENCOUNTER — Ambulatory Visit (HOSPITAL_COMMUNITY)
Admission: EM | Admit: 2017-02-25 | Discharge: 2017-02-25 | Disposition: A | Discharge status: Home / Self Care | Payer: BLUE CROSS/BLUE SHIELD | Attending: Family Medicine | Admitting: Family Medicine

## 2017-02-25 ENCOUNTER — Encounter (HOSPITAL_COMMUNITY): Payer: Self-pay | Admitting: Emergency Medicine

## 2017-02-25 DIAGNOSIS — S86911A Strain of unspecified muscle(s) and tendon(s) at lower leg level, right leg, initial encounter: Secondary | ICD-10-CM

## 2017-02-25 DIAGNOSIS — M25461 Effusion, right knee: Secondary | ICD-10-CM | POA: Diagnosis not present

## 2017-02-25 DIAGNOSIS — M25561 Pain in right knee: Secondary | ICD-10-CM | POA: Diagnosis not present

## 2017-02-25 MED ORDER — IBUPROFEN 800 MG PO TABS: 800.0000 mg | ORAL_TABLET | Freq: Three times a day (TID) | ORAL | 0 refills | Status: DC

## 2017-02-25 NOTE — Discharge Instructions (Signed)
Ice, elevate, use of sleeve, ibuprofen as needed for pain control. Activity as tolerated. May expect increased pain in the next 24 hours followed by gradual improvement. Continue to follow with your primary care provider and/or orthopedics if develop increased pain in the next week or no improvement of pain in the next 2-4 weeks

## 2017-02-25 NOTE — ED Notes (Signed)
Offered x-large knee sleeve, this knee sleeve too tight.  Offered 2 large ace wrap, patient agreeable to this option.

## 2017-02-25 NOTE — ED Provider Notes (Signed)
[facility]    CSN: [id number] Arrival date & time: 02/25/17  1242     History   Chief Complaint Chief Complaint  Patient presents with  . Knee Pain    HPI Joy Houston is a 52 y.o. female.   [name] presents with complaints of right knee pain after a slip and fall at approximately 1115 today. She slipped on wet tile in a bathroom causing her knee to twist and extend behind her body, she heard a pop. She is not certain if she landed on the knee. History of osgood schlatter but otherwise without history of knee issues. Rates pain 2/10 currently, it has improved. Worse with weight bearing. Without numbness or tingling. Has not taken any medications for her symptoms.    ROS per HPI.       Past Medical History:  Diagnosis Date  . Complication of anesthesia   . Eczema   . Family history of anesthesia complication    Mother - confusion  . Fatty liver   . Heart murmur    Slight - "nothing to worry about"  . Hemorrhoid   . Hypertension   . PONV (postoperative nausea and vomiting)     Patient Active Problem List   Diagnosis Date Noted  . Abdominal fullness in right upper quadrant 01/13/2015  . Sebaceous cyst 09/13/2014  . Change in vision 05/06/2014  . Vaginitis 05/06/2014  . Health care maintenance 05/06/2014  . Obesity (BMI 30-39.9) 09/05/2013  . Abnormal liver function 01/08/2012  . Hypercholesterolemia 01/08/2012  . Hematuria 01/08/2012  . Hyperglycemia 01/08/2012  . Hypertension 01/04/2012  . Lumbar herniated disc 12/15/2011    Past Surgical History:  Procedure Laterality Date  . CESAREAN SECTION    . FINGER SURGERY     pin to 3rd finger Right hand  . LUMBAR LAMINECTOMY/DECOMPRESSION MICRODISCECTOMY  12/15/2011   Procedure: LUMBAR LAMINECTOMY/DECOMPRESSION MICRODISCECTOMY 1 LEVEL;  Surgeon: Ophelia Charter, MD;  Location: [facility];  Service: Neurosurgery;  Laterality: Right;  RIGHT Lumbar four-Five diskectomy  . WISDOM TOOTH  EXTRACTION      OB History    No data available       Home Medications    Prior to Admission medications   Medication Sig Start Date End Date Taking? Authorizing Provider  hydrochlorothiazide (MICROZIDE) 12.5 MG capsule TAKE 1 TO 2 CAPSULES BY MOUTH EVERY DAY 10/10/16  Yes Einar Pheasant, MD  lisinopril (PRINIVIL,ZESTRIL) 5 MG tablet TAKE 1 TABLET (5 MG TOTAL) BY MOUTH DAILY. 09/05/16  Yes Einar Pheasant, MD  ibuprofen (ADVIL,MOTRIN) 800 MG tablet Take 1 tablet (800 mg total) by mouth 3 (three) times daily. 02/25/17   Zigmund Gottron, NP  mupirocin ointment (BACTROBAN) 2 % Place 1 application into the nose 2 (two) times daily. Patient not taking: Reported on 02/25/2017 07/07/16   Einar Pheasant, MD    Family History Family History  Problem Relation Age of Onset  . GER disease Mother   . Diabetes Father   . Cancer Father        Melanoma, Prostate  . Heart disease Father   . Heart disease Maternal Grandfather   . Diabetes Paternal Grandfather   . Breast cancer Sister 21    Social History Social History   Tobacco Use  . Smoking status: Never Smoker  . Smokeless tobacco: Never Used  Substance Use Topics  . Alcohol use: Yes    Alcohol/week: 0.0 oz    Comment: rare  . Drug use: No  Allergies   Morphine and related   Review of Systems Review of Systems   Physical Exam Triage Vital Signs ED Triage Vitals  Enc Vitals Group     BP 02/25/17 1344 (!) 173/71     Pulse Rate 02/25/17 1344 99     Resp 02/25/17 1344 16     Temp 02/25/17 1344 97.9 F (36.6 C)     Temp Source 02/25/17 1344 Oral     SpO2 02/25/17 1344 100 %     Weight --      Height --      Head Circumference --      Peak Flow --      Pain Score 02/25/17 1342 4     Pain Loc --      Pain Edu? --      Excl. in [place]? --    No data found.  Updated Vital Signs BP (!) 173/71 (BP Location: Left Arm) Comment: Notified Tina  Pulse 99   Temp 97.9 F (36.6 C) (Oral)   Resp 16   SpO2 100%   Visual  Acuity Right Eye Distance:   Left Eye Distance:   Bilateral Distance:    Right Eye Near:   Left Eye Near:    Bilateral Near:     Physical Exam  Constitutional: She is oriented to person, place, and time. She appears well-developed and well-nourished. No distress.  Cardiovascular: Normal rate, regular rhythm and normal heart sounds.  Pulmonary/Chest: Effort normal and breath sounds normal.  Musculoskeletal:       Right knee: She exhibits normal range of motion, no swelling, no ecchymosis, no deformity, no erythema, no LCL laxity, normal patellar mobility, no bony tenderness, normal meniscus and no MCL laxity. Tenderness found. Medial joint line tenderness noted.  Mild medial knee pain; without laxity; mild pain with flexion; without pain with extension; sensation intact; strength equal to bilateral lower extremities  Neurological: She is alert and oriented to person, place, and time.  Skin: Skin is warm and dry.     UC Treatments / Results  Labs (all labs ordered are listed, but only abnormal results are displayed) Labs Reviewed - No data to display  EKG  EKG Interpretation None       Radiology Dg Knee Complete 4 Views Right  Result Date: 02/25/2017 CLINICAL DATA:  Acute right knee pain following fall. Initial encounter. EXAM: RIGHT KNEE - COMPLETE 4+ VIEW COMPARISON:  None. FINDINGS: No evidence of acute fracture or dislocation. There may be a small knee effusion present. No evidence of arthropathy or other focal bone abnormality. Soft tissues are unremarkable. IMPRESSION: Question small knee effusion.  No acute bony abnormality. Electronically Signed   By: Margarette Canada M.D.   On: 02/25/2017 14:11    Procedures Procedures (including critical care time)  Medications Ordered in UC Medications - No data to display   Initial Impression / Assessment and Plan / UC Course  I have reviewed the triage vital signs and the nursing notes.  Pertinent labs & imaging results that were  available during my care of the patient were reviewed by me and considered in my medical decision making (see chart for details).     Knee sleeve, RICE therapy, ibuprofen. Return precautions provided. Discussed follow up with PCP and/or orthopedics as needed for follow up. Discussed expected course of healing. Patient verbalized understanding and agreeable to plan.  Ambulatory out of clinic without difficulty.    Final Clinical Impressions(s) / UC Diagnoses  Final diagnoses:  Strain of right knee, initial encounter  Effusion of right knee    ED Discharge Orders        Ordered    ibuprofen (ADVIL,MOTRIN) 800 MG tablet  3 times daily     02/25/17 1431       Controlled Substance Prescriptions [place] Controlled Substance Registry consulted? Not Applicable   Zigmund Gottron, NP 02/25/17 1437

## 2017-02-25 NOTE — ED Triage Notes (Signed)
Pt slipped on a wet floor, fell on her R leg, states it made a popping sound. C/o R knee pain.

## 2017-03-01 ENCOUNTER — Other Ambulatory Visit (INDEPENDENT_AMBULATORY_CARE_PROVIDER_SITE_OTHER): Payer: BLUE CROSS/BLUE SHIELD

## 2017-03-01 DIAGNOSIS — I10 Essential (primary) hypertension: Secondary | ICD-10-CM | POA: Diagnosis not present

## 2017-03-01 DIAGNOSIS — R739 Hyperglycemia, unspecified: Secondary | ICD-10-CM

## 2017-03-01 DIAGNOSIS — E7800 Pure hypercholesterolemia, unspecified: Secondary | ICD-10-CM | POA: Diagnosis not present

## 2017-03-01 LAB — BASIC METABOLIC PANEL
BUN: 15 mg/dL (ref 6–23)
CHLORIDE: 102 meq/L (ref 96–112)
CO2: 27 mEq/L (ref 19–32)
CREATININE: 0.67 mg/dL (ref 0.40–1.20)
Calcium: 9.3 mg/dL (ref 8.4–10.5)
GFR: 99.2 mL/min (ref >60.00)
GLUCOSE: 124 mg/dL — AB (ref 70–99)
POTASSIUM: 4.1 meq/L (ref 3.5–5.1)
Sodium: 138 mEq/L (ref 135–145)

## 2017-03-01 LAB — LIPID PANEL
CHOL/HDL RATIO: 4
Cholesterol: 198 mg/dL (ref 0–200)
HDL: 51.9 mg/dL (ref >39.00)
LDL CALC: 121 mg/dL — AB (ref 0–99)
NONHDL: 145.69
Triglycerides: 123 mg/dL (ref 0.0–149.0)
VLDL: 24.6 mg/dL (ref 0.0–40.0)

## 2017-03-01 LAB — HEPATIC FUNCTION PANEL
ALBUMIN: 4.2 g/dL (ref 3.5–5.2)
ALT: 40 U/L — ABNORMAL HIGH (ref 0–35)
AST: 34 U/L (ref 0–37)
Alkaline Phosphatase: 58 U/L (ref 39–117)
Bilirubin, Direct: 0.1 mg/dL (ref 0.0–0.3)
Total Bilirubin: 0.5 mg/dL (ref 0.2–1.2)
Total Protein: 7.3 g/dL (ref 6.0–8.3)

## 2017-03-01 LAB — HEMOGLOBIN A1C: HEMOGLOBIN A1C: 6.4 % (ref 4.6–6.5)

## 2017-03-02 ENCOUNTER — Ambulatory Visit (INDEPENDENT_AMBULATORY_CARE_PROVIDER_SITE_OTHER): Payer: BLUE CROSS/BLUE SHIELD | Admitting: Internal Medicine

## 2017-03-02 ENCOUNTER — Encounter: Payer: Self-pay | Admitting: Internal Medicine

## 2017-03-02 ENCOUNTER — Other Ambulatory Visit (HOSPITAL_COMMUNITY)
Admission: RE | Admit: 2017-03-02 | Discharge: 2017-03-02 | Disposition: A | Discharge status: Home / Self Care | Payer: BLUE CROSS/BLUE SHIELD | Source: Ambulatory Visit | Attending: Internal Medicine | Admitting: Internal Medicine

## 2017-03-02 VITALS — BP 136/90 | HR 94 | Temp 99.0°F | Ht 65.0 in | Wt 260.0 lb

## 2017-03-02 DIAGNOSIS — I10 Essential (primary) hypertension: Secondary | ICD-10-CM | POA: Diagnosis not present

## 2017-03-02 DIAGNOSIS — M25561 Pain in right knee: Secondary | ICD-10-CM | POA: Diagnosis not present

## 2017-03-02 DIAGNOSIS — K7689 Other specified diseases of liver: Secondary | ICD-10-CM | POA: Diagnosis not present

## 2017-03-02 DIAGNOSIS — Z124 Encounter for screening for malignant neoplasm of cervix: Secondary | ICD-10-CM

## 2017-03-02 DIAGNOSIS — E7800 Pure hypercholesterolemia, unspecified: Secondary | ICD-10-CM | POA: Diagnosis not present

## 2017-03-02 DIAGNOSIS — R739 Hyperglycemia, unspecified: Secondary | ICD-10-CM

## 2017-03-02 DIAGNOSIS — Z6841 Body Mass Index (BMI) 40.0 and over, adult: Secondary | ICD-10-CM

## 2017-03-02 DIAGNOSIS — R945 Abnormal results of liver function studies: Secondary | ICD-10-CM

## 2017-03-02 NOTE — Progress Notes (Signed)
Patient ID: Joy Houston, female   DOB: [birth year], 52 y.o.   MRN: [id number]   Subjective:    Patient ID: Joy Houston, female    DOB: April 01, 1965, 52 y.o.   MRN: [id number]  HPI  Patient here for her physical exam.  She reports she fell five days ago.  Her leg/knee hyperextended.  (right).  Increased pain.  Evaluated at [facility].  Xray.  No acute bone abnormality.  Question of small effusion.  Wearing a brace.  Still with increased pain.  Limiting her walking.  Not watching her diet.  Not exercising.  No sob.  No acid reflux.  No abdominal pain.  Bowels moving.  Discussed labs.  Elevated liver enzymes.  Discussed need for diet adjustment and weight loss.     Past Medical History:  Diagnosis Date  . Complication of anesthesia   . Eczema   . Family history of anesthesia complication    Mother - confusion  . Fatty liver   . Heart murmur    Slight - "nothing to worry about"  . Hemorrhoid   . Hypertension   . PONV (postoperative nausea and vomiting)    Past Surgical History:  Procedure Laterality Date  . CESAREAN SECTION    . FINGER SURGERY     pin to 3rd finger Right hand  . LUMBAR LAMINECTOMY/DECOMPRESSION MICRODISCECTOMY  12/15/2011   Procedure: LUMBAR LAMINECTOMY/DECOMPRESSION MICRODISCECTOMY 1 LEVEL;  Surgeon: Ophelia Charter, MD;  Location: [facility];  Service: Neurosurgery;  Laterality: Right;  RIGHT Lumbar four-Five diskectomy  . WISDOM TOOTH EXTRACTION     Family History  Problem Relation Age of Onset  . GER disease Mother   . Diabetes Father   . Cancer Father        Melanoma, Prostate  . Heart disease Father   . Heart disease Maternal Grandfather   . Diabetes Paternal Grandfather   . Breast cancer Sister 21   Social History   Socioeconomic History  . Marital status: Married    Spouse name: None  . Number of children: 2  . Years of education: None  . Highest education level: None  Social Needs  . Financial resource strain: None  .  Food insecurity - worry: None  . Food insecurity - inability: None  . Transportation needs - medical: None  . Transportation needs - non-medical: None  Occupational History    Employer: [employer]  Tobacco Use  . Smoking status: Never Smoker  . Smokeless tobacco: Never Used  Substance and Sexual Activity  . Alcohol use: Yes    Alcohol/week: 0.0 oz    Comment: rare  . Drug use: No  . Sexual activity: None  Other Topics Concern  . None  Social History Narrative  . None    Outpatient Encounter Medications as of 03/02/2017  Medication Sig  . hydrochlorothiazide (MICROZIDE) 12.5 MG capsule TAKE 1 TO 2 CAPSULES BY MOUTH EVERY DAY  . ibuprofen (ADVIL,MOTRIN) 800 MG tablet Take 1 tablet (800 mg total) by mouth 3 (three) times daily.  Marland Kitchen lisinopril (PRINIVIL,ZESTRIL) 5 MG tablet TAKE 1 TABLET (5 MG TOTAL) BY MOUTH DAILY.  . mupirocin ointment (BACTROBAN) 2 % Place 1 application into the nose 2 (two) times daily. (Patient not taking: Reported on 02/25/2017)   No facility-administered encounter medications on file as of 03/02/2017.     Review of Systems  Constitutional: Negative for appetite change, fatigue and unexpected weight change.  HENT: Negative for congestion and sinus  pressure.   Eyes: Negative for pain and visual disturbance.  Respiratory: Negative for cough, chest tightness and shortness of breath.   Cardiovascular: Negative for chest pain, palpitations and leg swelling.  Gastrointestinal: Negative for abdominal pain, diarrhea, nausea and vomiting.  Genitourinary: Negative for difficulty urinating and dysuria.  Musculoskeletal: Negative for myalgias.       Right knee pain as outlined.    Skin: Negative for color change and rash.  Neurological: Negative for dizziness, light-headedness and headaches.  Hematological: Negative for adenopathy. Does not bruise/bleed easily.  Psychiatric/Behavioral: Negative for agitation and dysphoric mood.       Objective:    Physical  Exam  Constitutional: She is oriented to person, place, and time. She appears well-developed and well-nourished. No distress.  HENT:  Nose: Nose normal.  Mouth/Throat: Oropharynx is clear and moist.  Eyes: Right eye exhibits no discharge. Left eye exhibits no discharge. No scleral icterus.  Neck: Neck supple. No thyromegaly present.  Cardiovascular: Normal rate and regular rhythm.  Pulmonary/Chest: Breath sounds normal. No accessory muscle usage. No tachypnea. No respiratory distress. She has no decreased breath sounds. She has no wheezes. She has no rhonchi. Right breast exhibits no inverted nipple, no mass, no nipple discharge and no tenderness (no axillary adenopathy). Left breast exhibits no inverted nipple, no mass, no nipple discharge and no tenderness (no axilarry adenopathy).  Abdominal: Soft. Bowel sounds are normal. There is no tenderness.  Musculoskeletal: She exhibits no edema or tenderness.  Increased pain to palpation - knee and in the popliteal region.  Increased pain with full extension and flexion.  Increased pain with weight bearing.    Lymphadenopathy:    She has no cervical adenopathy.  Neurological: She is alert and oriented to person, place, and time.  Skin: Skin is warm. No rash noted. No erythema.  Psychiatric: She has a normal mood and affect. Her behavior is normal.    BP 136/90   Pulse 94   Temp 99 F (37.2 C) (Oral)   Ht 5' 5"  (1.651 m)   Wt 260 lb (117.9 kg)   SpO2 96%   BMI 43.27 kg/m  Wt Readings from Last 3 Encounters:  03/02/17 260 lb (117.9 kg)  02/03/17 258 lb 12.8 oz (117.4 kg)  07/07/16 241 lb 3.2 oz (109.4 kg)     Lab Results  Component Value Date   WBC 8.5 04/01/2016   HGB 14.8 04/01/2016   HCT 43.6 04/01/2016   PLT 288.0 04/01/2016   GLUCOSE 124 (H) 03/01/2017   CHOL 198 03/01/2017   TRIG 123.0 03/01/2017   HDL 51.90 03/01/2017   LDLDIRECT 145.4 03/01/2013   LDLCALC 121 (H) 03/01/2017   ALT 40 (H) 03/01/2017   AST 34 03/01/2017     NA 138 03/01/2017   K 4.1 03/01/2017   CL 102 03/01/2017   CREATININE 0.67 03/01/2017   BUN 15 03/01/2017   CO2 27 03/01/2017   TSH 2.77 04/01/2016   HGBA1C 6.4 03/01/2017   MICROALBUR 0.9 05/06/2014    Dg Knee Complete 4 Views Right  Result Date: 02/25/2017 CLINICAL DATA:  Acute right knee pain following fall. Initial encounter. EXAM: RIGHT KNEE - COMPLETE 4+ VIEW COMPARISON:  None. FINDINGS: No evidence of acute fracture or dislocation. There may be a small knee effusion present. No evidence of arthropathy or other focal bone abnormality. Soft tissues are unremarkable. IMPRESSION: Question small knee effusion.  No acute bony abnormality. Electronically Signed   By: Cleatis Polka.D.  On: 02/25/2017 14:11       Assessment & Plan:   Problem List Items Addressed This Visit    Abnormal liver function    Discussed importance of diet and exercise.  Previous ultrasound with fatty liver.        BMI 40.0-44.9, adult (HCC)    Discussed diet and exercise.  Follow.        Hypercholesterolemia    Low cholesterol diet and exercise.  Follow lipid panel.        Relevant Orders   Hepatic function panel   Lipid panel   Hyperglycemia    Low carb diet and exercise.  Follow met b and a1c.        Relevant Orders   Hemoglobin A1c   Hypertension    Blood pressure slightly elevated today.  Recheck improved.  Have her spot check her pressure.  Get her back in to reassess. Send in readings.        Relevant Orders   CBC with Differential/Platelet   TSH   Basic metabolic panel   Right knee pain    Recent fall as outlined.  Was evaluated at urgent care.  Xray revealed no acute bony abnormality.  Question of small effusion.  Given type of injury and persistent pain, refer to ortho for further evaluation and treatment.        Relevant Orders   Ambulatory referral to Orthopedic Surgery    Other Visit Diagnoses    Screening for cervical cancer    -  Primary   Relevant Orders   Cytology -  PAP       Einar Pheasant, MD

## 2017-03-02 NOTE — Progress Notes (Signed)
Pre visit review using our clinic review tool, if applicable. No additional management support is needed unless otherwise documented below in the visit note. 

## 2017-03-05 ENCOUNTER — Encounter: Payer: Self-pay | Admitting: Internal Medicine

## 2017-03-05 DIAGNOSIS — M25561 Pain in right knee: Secondary | ICD-10-CM | POA: Insufficient documentation

## 2017-03-05 NOTE — Assessment & Plan Note (Signed)
Low carb diet and exercise.  Follow met b and a1c.   

## 2017-03-05 NOTE — Assessment & Plan Note (Signed)
Recent fall as outlined.  Was evaluated at urgent care.  Xray revealed no acute bony abnormality.  Question of small effusion.  Given type of injury and persistent pain, refer to ortho for further evaluation and treatment.

## 2017-03-05 NOTE — Assessment & Plan Note (Signed)
Discussed importance of diet and exercise.  Previous ultrasound with fatty liver.

## 2017-03-05 NOTE — Assessment & Plan Note (Signed)
Low cholesterol diet and exercise.  Follow lipid panel.   

## 2017-03-05 NOTE — Assessment & Plan Note (Signed)
Blood pressure slightly elevated today.  Recheck improved.  Have her spot check her pressure.  Get her back in to reassess. Send in readings.

## 2017-03-05 NOTE — Assessment & Plan Note (Signed)
Discussed diet and exercise.  Follow.  

## 2017-03-06 ENCOUNTER — Encounter: Payer: Self-pay | Admitting: Internal Medicine

## 2017-03-06 LAB — CYTOLOGY - PAP
DIAGNOSIS: NEGATIVE
HPV (WINDOPATH): NOT DETECTED

## 2017-03-08 NOTE — Telephone Encounter (Signed)
Pt sent me a my chart message regarding an appt with ortho.  See referral comments. She prefers to see Dr Hart Robinsons at [facility].

## 2017-03-24 DIAGNOSIS — S86111A Strain of other muscle(s) and tendon(s) of posterior muscle group at lower leg level, right leg, initial encounter: Secondary | ICD-10-CM | POA: Diagnosis not present

## 2017-04-06 ENCOUNTER — Ambulatory Visit: Payer: Self-pay | Admitting: Adult Health

## 2017-04-06 ENCOUNTER — Encounter: Payer: Self-pay | Admitting: Adult Health

## 2017-04-06 VITALS — BP 149/78 | HR 97 | Temp 98.0°F | Resp 16 | Ht 66.0 in

## 2017-04-06 DIAGNOSIS — J029 Acute pharyngitis, unspecified: Secondary | ICD-10-CM

## 2017-04-06 MED ORDER — PREDNISONE 10 MG (21) PO TBPK
ORAL_TABLET | ORAL | 0 refills | Status: DC
Start: 2017-04-06 — End: 2017-06-05

## 2017-04-06 MED ORDER — AMOXICILLIN-POT CLAVULANATE 875-125 MG PO TABS: 1.0000 | ORAL_TABLET | Freq: Two times a day (BID) | ORAL | 0 refills | Status: DC

## 2017-04-06 NOTE — Progress Notes (Signed)
Subjective:     Patient ID: Joy Houston, female   DOB: 09/08/1965, 52 y.o.   MRN: [id number]  HPI Patient is a 52 year old fema;le in no acute distress who reports she had a post nasal drip  Cough, congestion since the middle of last week around 03/28/17. She reports mild fever past two days. She continues sore throat.    She is a Marine scientist in occupational health.   Patient  denies any chills, rash, chest pain, shortness of breath, nausea, vomiting, or diarrhea.  Blood pressure (!) 149/78, pulse 97, temperature 98 F (36.7 C), resp. rate 16, height 5\' 6"  (1.676 m), SpO2 99 %. She is having her blood pressure followed by Einar Pheasant, MD and reports this is good for her.  Recheck temperature tympanic right ear per patient request 99.8 F.  She denies any other symptoms or concerns at this time.  Review of Systems  Constitutional: Positive for activity change (tired when home from work past two days ready for bed " not her ususal" ), fatigue and fever. Negative for appetite change, chills, diaphoresis and unexpected weight change.  HENT: Positive for ear pain, rhinorrhea, sinus pressure and sore throat. Negative for congestion, dental problem, drooling, ear discharge, facial swelling, hearing loss, mouth sores, nosebleeds, postnasal drip, sinus pain, tinnitus, trouble swallowing (no trouble swallowing liquids or food but throat does " feel tight" at times / denies any choking or difficulty breathing ) and voice change.   Eyes: Negative.   Respiratory: Positive for cough. Negative for apnea, choking, chest tightness, shortness of breath, wheezing and stridor.   Cardiovascular: Negative.   Gastrointestinal: Negative.   Genitourinary: Negative.   Musculoskeletal: Negative.   Skin: Negative.   Neurological: Negative.   Hematological: Negative.   Psychiatric/Behavioral: Negative.        Objective:   Physical Exam  Constitutional: She is oriented to person, place, and time. She appears  well-developed and well-nourished. No distress.  HENT:  Head: Normocephalic and atraumatic.  Right Ear: Hearing, external ear and ear canal normal. No tenderness. Tympanic membrane is not perforated, not erythematous and not retracted. A middle ear effusion is present.  Left Ear: Hearing, tympanic membrane, external ear and ear canal normal. No tenderness. Tympanic membrane is not perforated, not erythematous and not retracted.  Nose: Rhinorrhea present. No mucosal edema. Right sinus exhibits no maxillary sinus tenderness and no frontal sinus tenderness. Left sinus exhibits no maxillary sinus tenderness and no frontal sinus tenderness.  Mouth/Throat: Uvula is midline and mucous membranes are normal. No uvula swelling. Posterior oropharyngeal erythema present. No oropharyngeal exudate, posterior oropharyngeal edema or tonsillar abscesses.  Mild tonsillar enlargement bilateral 1+ right tonsil / 2 + right   Eyes: Conjunctivae and EOM are normal. Pupils are equal, round, and reactive to light. Right eye exhibits no discharge. Left eye exhibits no discharge. No scleral icterus.  Neck: Normal range of motion. Neck supple. No JVD present. No tracheal deviation present.  Cardiovascular: Normal rate, regular rhythm, normal heart sounds and intact distal pulses. Exam reveals no gallop and no friction rub.  No murmur heard. Pulmonary/Chest: Effort normal and breath sounds normal. No stridor. No respiratory distress. She has no wheezes. She has no rales. She exhibits no tenderness.  Abdominal: Soft. Bowel sounds are normal.  Musculoskeletal: Normal range of motion.  Lymphadenopathy:       Head (right side): Tonsillar adenopathy present.       Head (left side): Tonsillar adenopathy present.    She  has no cervical adenopathy.  Mild bilaterally   Neurological: She is alert and oriented to person, place, and time. She displays normal reflexes. No cranial nerve deficit. She exhibits normal muscle tone.  Coordination normal.  Skin: Skin is warm and dry. No rash noted. She is not diaphoretic. No cyanosis or erythema. No pallor. Nails show no clubbing.  Psychiatric: She has a normal mood and affect. Her speech is normal and behavior is normal. Judgment and thought content normal. Cognition and memory are normal.  Patient moves on and off of exam table and in room without difficulty. Gait is normal in hall and in room. Patient is oriented to person place time and situation. Patient answers questions appropriately and engages in conversation.   Vitals reviewed.      Assessment:       Pharyngitis, unspecified etiology   Plan:     Meds ordered this encounter  Medications  . amoxicillin-clavulanate (AUGMENTIN) 875-125 MG tablet    Sig: Take 1 tablet by mouth 2 (two) times daily.    Dispense:  20 tablet    Refill:  0  . predniSONE (STERAPRED UNI-PAK 21 TAB) 10 MG (21) TBPK tablet    Sig: By mouth Take 6 tablets on day 1, Take 5 tablets day 2 Take 4 tablets day 3 Take 3 tablets day 4 Take 2 tablets day five 5 Take 1 tablet day    Dispense:  21 tablet    Refill:  0   Work note given until 24 hours fever free or no longer than three days will need return office visit for any further note.  Advised to return to clinic for an appointment if no improvement within 72 hours or if any symptoms change or worsen. Advised ER or urgent Care if after hours or on weekend. 911 for emergency symptoms at any time.   Patient verbalized understanding of all instructions given and denies any further questions at this time.

## 2017-04-06 NOTE — Patient Instructions (Signed)
Sinusitis, Adult Sinusitis is soreness and inflammation of your sinuses. Sinuses are hollow spaces in the bones around your face. They are located:  Around your eyes.  In the middle of your forehead.  Behind your nose.  In your cheekbones.  Your sinuses and nasal passages are lined with a stringy fluid (mucus). Mucus normally drains out of your sinuses. When your nasal tissues get inflamed or swollen, the mucus can get trapped or blocked so air cannot flow through your sinuses. This lets bacteria, viruses, and funguses grow, and that leads to infection. Follow these instructions at home: Medicines  Take, use, or apply over-the-counter and prescription medicines only as told by your doctor. These may include nasal sprays.  If you were prescribed an antibiotic medicine, take it as told by your doctor. Do not stop taking the antibiotic even if you start to feel better. Hydrate and Humidify  Drink enough water to keep your pee (urine) clear or pale yellow.  Use a cool mist humidifier to keep the humidity level in your home above 50%.  Breathe in steam for 10-15 minutes, 3-4 times a day or as told by your doctor. You can do this in the bathroom while a hot shower is running.  Try not to spend time in cool or dry air. Rest  Rest as much as possible.  Sleep with your head raised (elevated).  Make sure to get enough sleep each night. General instructions  Put a warm, moist washcloth on your face 3-4 times a day or as told by your doctor. This will help with discomfort.  Wash your hands often with soap and water. If there is no soap and water, use hand sanitizer.  Do not smoke. Avoid being around people who are smoking (secondhand smoke).  Keep all follow-up visits as told by your doctor. This is important. Contact a doctor if:  You have a fever.  Your symptoms get worse.  Your symptoms do not get better within 10 days. Get help right away if:  You have a very bad  headache.  You cannot stop throwing up (vomiting).  You have pain or swelling around your face or eyes.  You have trouble seeing.  You feel confused.  Your neck is stiff.  You have trouble breathing. This information is not intended to replace advice given to you by your health care provider. Make sure you discuss any questions you have with your health care provider. Document Released: 07/27/2007 Document Revised: 10/04/2015 Document Reviewed: 12/03/2014 Elsevier Interactive Patient Education  2018 [employer]. Pharyngitis Pharyngitis is a sore throat (pharynx). There is redness, pain, and swelling of your throat. Follow these instructions at home:  Drink enough fluids to keep your pee (urine) clear or pale yellow.  Only take medicine as told by your doctor. ? You may get sick again if you do not take medicine as told. Finish your medicines, even if you start to feel better. ? Do not take aspirin.  Rest.  Rinse your mouth (gargle) with salt water ( tsp of salt per 1 qt of water) every 1-2 hours. This will help the pain.  If you are not at risk for choking, you can suck on hard candy or sore throat lozenges. Contact a doctor if:  You have large, tender lumps on your neck.  You have a rash.  You cough up green, yellow-brown, or bloody spit. Get help right away if:  You have a stiff neck.  You drool or cannot swallow liquids.  You throw up (vomit) or are not able to keep medicine or liquids down.  You have very bad pain that does not go away with medicine.  You have problems breathing (not from a stuffy nose). This information is not intended to replace advice given to you by your health care provider. Make sure you discuss any questions you have with your health care provider. Document Released: 07/27/2007 Document Revised: 07/16/2015 Document Reviewed: 10/15/2012 Elsevier Interactive Patient Education  2017 [employer].

## 2017-04-11 ENCOUNTER — Other Ambulatory Visit: Payer: Self-pay

## 2017-04-11 MED ORDER — LISINOPRIL 5 MG PO TABS: 5.0000 mg | ORAL_TABLET | Freq: Every day | ORAL | 1 refills | Status: DC

## 2017-04-24 DIAGNOSIS — S83241D Other tear of medial meniscus, current injury, right knee, subsequent encounter: Secondary | ICD-10-CM | POA: Diagnosis not present

## 2017-04-24 DIAGNOSIS — S86111A Strain of other muscle(s) and tendon(s) of posterior muscle group at lower leg level, right leg, initial encounter: Secondary | ICD-10-CM | POA: Diagnosis not present

## 2017-05-29 ENCOUNTER — Encounter: Payer: Self-pay | Admitting: Internal Medicine

## 2017-05-31 ENCOUNTER — Other Ambulatory Visit: Payer: BLUE CROSS/BLUE SHIELD

## 2017-06-01 ENCOUNTER — Ambulatory Visit: Payer: BLUE CROSS/BLUE SHIELD | Admitting: Internal Medicine

## 2017-06-05 ENCOUNTER — Ambulatory Visit: Payer: BLUE CROSS/BLUE SHIELD | Admitting: Internal Medicine

## 2017-06-05 ENCOUNTER — Encounter: Payer: Self-pay | Admitting: Internal Medicine

## 2017-06-05 DIAGNOSIS — R739 Hyperglycemia, unspecified: Secondary | ICD-10-CM | POA: Diagnosis not present

## 2017-06-05 DIAGNOSIS — M25561 Pain in right knee: Secondary | ICD-10-CM | POA: Diagnosis not present

## 2017-06-05 DIAGNOSIS — M5126 Other intervertebral disc displacement, lumbar region: Secondary | ICD-10-CM | POA: Diagnosis not present

## 2017-06-05 DIAGNOSIS — E7800 Pure hypercholesterolemia, unspecified: Secondary | ICD-10-CM | POA: Diagnosis not present

## 2017-06-05 DIAGNOSIS — I10 Essential (primary) hypertension: Secondary | ICD-10-CM

## 2017-06-05 DIAGNOSIS — R319 Hematuria, unspecified: Secondary | ICD-10-CM | POA: Diagnosis not present

## 2017-06-05 DIAGNOSIS — Z6841 Body Mass Index (BMI) 40.0 and over, adult: Secondary | ICD-10-CM

## 2017-06-05 DIAGNOSIS — R945 Abnormal results of liver function studies: Secondary | ICD-10-CM

## 2017-06-05 MED ORDER — IBUPROFEN 800 MG PO TABS: 800.0000 mg | ORAL_TABLET | Freq: Two times a day (BID) | ORAL | 0 refills | Status: DC | PRN

## 2017-06-05 NOTE — Progress Notes (Signed)
Patient ID: [name], female   DOB: August 08, 1965, 52 y.o.   MRN: [id number]   Subjective:    Patient ID: Joy Houston, female    DOB: 08/23/1965, 52 y.o.   MRN: [id number]  HPI  Patient here for a scheduled follow up.  She is still having knee pain.  Has been limiting her activity.  Saw ortho.  Has a probable meniscal tear.  S/p injection.  Better.  Desires no further testing (MRI) and declines surgery.  She has noticed some increased left lower back.  Flared 4 weeks ago.  Some numbness down her left lateral leg.  Took some ibuprofen.  Noticed worsening pain when she changes position - going from sitting to standing.  Now is better.  Has a stand up desk.  Gets massages intermittently.  Helps.  No chest pain.  No sob.  No acid reflux.  No abdominal pain.  Some bloating at times.  Discussed diet and exercise.  Discussed the need for weight loss.     Past Medical History:  Diagnosis Date  . Complication of anesthesia   . Eczema   . Family history of anesthesia complication    Mother - confusion  . Fatty liver   . Heart murmur    Slight - "nothing to worry about"  . Hemorrhoid   . Hypertension   . PONV (postoperative nausea and vomiting)    Past Surgical History:  Procedure Laterality Date  . CESAREAN SECTION    . FINGER SURGERY     pin to 3rd finger Right hand  . LUMBAR LAMINECTOMY/DECOMPRESSION MICRODISCECTOMY  12/15/2011   Procedure: LUMBAR LAMINECTOMY/DECOMPRESSION MICRODISCECTOMY 1 LEVEL;  Surgeon: Joy Charter, MD;  Location: [facility];  Service: Neurosurgery;  Laterality: Right;  RIGHT Lumbar four-Five diskectomy  . WISDOM TOOTH EXTRACTION     Family History  Problem Relation Age of Onset  . GER disease Mother   . Diabetes Father   . Cancer Father        Melanoma, Prostate  . Heart disease Father   . Heart disease Maternal Grandfather   . Diabetes Paternal Grandfather   . Breast cancer Sister 52   Social History   Socioeconomic History  . Marital  status: Married    Spouse name: Not on file  . Number of children: 2  . Years of education: Not on file  . Highest education level: Not on file  Occupational History    Employer: [employer]  . Financial resource strain: Not on file  . Food insecurity:    Worry: Not on file    Inability: Not on file  . Transportation needs:    Medical: Not on file    Non-medical: Not on file  Tobacco Use  . Smoking status: Never Smoker  . Smokeless tobacco: Never Used  Substance and Sexual Activity  . Alcohol use: Yes    Alcohol/week: 0.0 oz    Comment: rare  . Drug use: No  . Sexual activity: Not on file  Lifestyle  . Physical activity:    Days per week: Not on file    Minutes per session: Not on file  . Stress: Not on file  Relationships  . Social connections:    Talks on phone: Not on file    Gets together: Not on file    Attends religious service: Not on file    Active member of club or organization: Not on file    Attends meetings of  clubs or organizations: Not on file    Relationship status: Not on file  Other Topics Concern  . Not on file  Social History Narrative  . Not on file    Outpatient Encounter Medications as of 06/05/2017  Medication Sig  . hydrochlorothiazide (MICROZIDE) 12.5 MG capsule hydrochlorothiazide 12.5 mg capsule  TAKE 1 TO 2 CAPSULES BY MOUTH EVERY DAY  . ibuprofen (ADVIL,MOTRIN) 800 MG tablet Take 1 tablet (800 mg total) by mouth 2 (two) times daily as needed.  Marland Kitchen lisinopril (PRINIVIL,ZESTRIL) 5 MG tablet Take 1 tablet (5 mg total) by mouth daily.  . mupirocin ointment (BACTROBAN) 2 % Place 1 application into the nose 2 (two) times daily. (Patient not taking: Reported on 02/25/2017)  . [DISCONTINUED] amoxicillin-clavulanate (AUGMENTIN) 875-125 MG tablet Take 1 tablet by mouth 2 (two) times daily.  . [DISCONTINUED] hydrochlorothiazide (MICROZIDE) 12.5 MG capsule TAKE 1 TO 2 CAPSULES BY MOUTH EVERY DAY  . [DISCONTINUED] ibuprofen  (ADVIL,MOTRIN) 800 MG tablet Take 1 tablet (800 mg total) by mouth 3 (three) times daily.  . [DISCONTINUED] predniSONE (STERAPRED UNI-PAK 21 TAB) 10 MG (21) TBPK tablet By mouth Take 6 tablets on day 1, Take 5 tablets day 2 Take 4 tablets day 3 Take 3 tablets day 4 Take 2 tablets day five 5 Take 1 tablet day   No facility-administered encounter medications on file as of 06/05/2017.     Review of Systems  Constitutional: Negative for appetite change and unexpected weight change.  HENT: Negative for congestion and sinus pressure.   Respiratory: Negative for cough, chest tightness and shortness of breath.   Cardiovascular: Negative for chest pain, palpitations and leg swelling.  Gastrointestinal: Negative for abdominal pain, diarrhea, nausea and vomiting.  Genitourinary: Negative for difficulty urinating and dysuria.  Musculoskeletal: Positive for back pain. Negative for joint swelling and myalgias.       Knee pain as outlined.    Skin: Negative for color change and rash.  Neurological: Negative for dizziness, light-headedness and headaches.  Psychiatric/Behavioral: Negative for agitation and dysphoric mood.       Objective:     Blood pressure rechecked by me:  128/88  Physical Exam  Constitutional: She appears well-developed and well-nourished. No distress.  HENT:  Nose: Nose normal.  Mouth/Throat: Oropharynx is clear and moist.  Neck: Neck supple. No thyromegaly present.  Cardiovascular: Normal rate and regular rhythm.  Pulmonary/Chest: Breath sounds normal. No respiratory distress. She has no wheezes.  Abdominal: Soft. Bowel sounds are normal. There is no tenderness.  Musculoskeletal: She exhibits no edema or tenderness.  Lymphadenopathy:    She has no cervical adenopathy.  Skin: No rash noted. No erythema.  Psychiatric: She has a normal mood and affect. Her behavior is normal.    BP 130/88 (BP Location: Left Arm, Patient Position: Sitting, Cuff Size: Large)   Pulse 94    Temp (!) 97.3 F (36.3 C) (Oral)   Resp 18   Wt 254 lb 3.2 oz (115.3 kg)   SpO2 97%   BMI 41.03 kg/m  Wt Readings from Last 3 Encounters:  06/05/17 254 lb 3.2 oz (115.3 kg)  03/02/17 260 lb (117.9 kg)  02/03/17 258 lb 12.8 oz (117.4 kg)     Lab Results  Component Value Date   WBC 8.5 04/01/2016   HGB 14.8 04/01/2016   HCT 43.6 04/01/2016   PLT 288.0 04/01/2016   GLUCOSE 124 (H) 03/01/2017   CHOL 198 03/01/2017   TRIG 123.0 03/01/2017   HDL 51.90 03/01/2017  LDLDIRECT 145.4 03/01/2013   LDLCALC 121 (H) 03/01/2017   ALT 40 (H) 03/01/2017   AST 34 03/01/2017   NA 138 03/01/2017   K 4.1 03/01/2017   CL 102 03/01/2017   CREATININE 0.67 03/01/2017   BUN 15 03/01/2017   CO2 27 03/01/2017   TSH 2.77 04/01/2016   HGBA1C 6.4 03/01/2017   MICROALBUR 0.9 05/06/2014       Assessment & Plan:   Problem List Items Addressed This Visit    Abnormal liver function    Discussed diet and exercise and weight loss.  Follow liver function tests.        BMI 40.0-44.9, adult (HCC)    Discussed diet and exercise.  Follow.        Hematuria    Saw Dr Bernardo Heater.  Follow urinalysis.  Recheck with next labs.        Relevant Orders   Urinalysis, Routine w reflex microscopic   Hypercholesterolemia    Low cholesterol diet and exercise.  Follow lipid panel.       Hyperglycemia    Low carb diet and exercise.  Follow met b and a1c.       Hypertension    Blood pressure slightly elevated. Have her spot check her pressure.  She wants to work on diet and exercise.  Will follow.  If persistent elevation, will need to adjust medication.        Lumbar herniated disc    Previous back pain - flare.  Better now.  Desires no further intervention at this time.  Follow.        Right knee pain    Saw ortho.  Concern over meniscal tear.  Desires not to have surgery.  Follow.            Einar Pheasant, MD

## 2017-06-10 ENCOUNTER — Encounter: Payer: Self-pay | Admitting: Internal Medicine

## 2017-06-10 NOTE — Assessment & Plan Note (Signed)
Discussed diet and exercise.  Follow.  

## 2017-06-10 NOTE — Assessment & Plan Note (Signed)
Low cholesterol diet and exercise.  Follow lipid panel.   

## 2017-06-10 NOTE — Assessment & Plan Note (Signed)
Saw ortho.  Concern over meniscal tear.  Desires not to have surgery.  Follow.

## 2017-06-10 NOTE — Assessment & Plan Note (Signed)
Low carb diet and exercise.  Follow met b and a1c.  

## 2017-06-10 NOTE — Assessment & Plan Note (Signed)
Previous back pain - flare.  Better now.  Desires no further intervention at this time.  Follow.

## 2017-06-10 NOTE — Assessment & Plan Note (Signed)
Saw Dr Bernardo Heater.  Follow urinalysis.  Recheck with next labs.

## 2017-06-10 NOTE — Assessment & Plan Note (Signed)
Discussed diet and exercise and weight loss.  Follow liver function tests.

## 2017-06-10 NOTE — Assessment & Plan Note (Signed)
Blood pressure slightly elevated. Have her spot check her pressure.  She wants to work on diet and exercise.  Will follow.  If persistent elevation, will need to adjust medication.

## 2017-07-25 ENCOUNTER — Other Ambulatory Visit: Payer: Self-pay | Admitting: Internal Medicine

## 2017-09-10 ENCOUNTER — Emergency Department
Admission: EM | Admit: 2017-09-10 | Discharge: 2017-09-10 | Disposition: A | Discharge status: Home / Self Care | Payer: BLUE CROSS/BLUE SHIELD | Attending: Emergency Medicine | Admitting: Emergency Medicine

## 2017-09-10 ENCOUNTER — Emergency Department: Payer: BLUE CROSS/BLUE SHIELD

## 2017-09-10 ENCOUNTER — Encounter: Payer: Self-pay | Admitting: Emergency Medicine

## 2017-09-10 ENCOUNTER — Other Ambulatory Visit: Payer: Self-pay

## 2017-09-10 DIAGNOSIS — I10 Essential (primary) hypertension: Secondary | ICD-10-CM | POA: Diagnosis not present

## 2017-09-10 DIAGNOSIS — K649 Unspecified hemorrhoids: Secondary | ICD-10-CM

## 2017-09-10 DIAGNOSIS — Z79899 Other long term (current) drug therapy: Secondary | ICD-10-CM | POA: Insufficient documentation

## 2017-09-10 DIAGNOSIS — M25562 Pain in left knee: Secondary | ICD-10-CM | POA: Insufficient documentation

## 2017-09-10 DIAGNOSIS — M79662 Pain in left lower leg: Secondary | ICD-10-CM | POA: Insufficient documentation

## 2017-09-10 DIAGNOSIS — M79605 Pain in left leg: Secondary | ICD-10-CM | POA: Diagnosis not present

## 2017-09-10 DIAGNOSIS — M7989 Other specified soft tissue disorders: Secondary | ICD-10-CM | POA: Diagnosis not present

## 2017-09-10 MED ORDER — HYDROCORTISONE ACETATE 25 MG RE SUPP: 25.0000 mg | Freq: Two times a day (BID) | RECTAL | 0 refills | Status: DC

## 2017-09-10 MED ORDER — IBUPROFEN 600 MG PO TABS: 600.0000 mg | ORAL_TABLET | Freq: Four times a day (QID) | ORAL | 0 refills | Status: DC | PRN

## 2017-09-10 MED ORDER — KETOROLAC TROMETHAMINE 30 MG/ML IJ SOLN
30.0000 mg | Freq: Once | INTRAMUSCULAR | Status: AC
  Administered 2017-09-10: 30 mg via INTRAMUSCULAR
  Filled 2017-09-10: qty 1

## 2017-09-10 NOTE — ED Provider Notes (Signed)
[facility] Emergency Department Provider Note  ____________________________________________  Time seen: Approximately 10:28 AM  I have reviewed the triage vital signs and the nursing notes.   HISTORY  Chief Complaint Knee Pain    HPI Joy Houston is a 52 y.o. female that presents to the emergency department for evaluation of left knee pain. She is spends most of the day sitting at work. 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 She more pain when she goes from a sitting to standing position. Pain improves the more she walks. Pain is worse on the sides and back. She has torn her meniscus in the right knee and is seeing ortho for that. She sees Dr. Theda Sers. She has taking ibuprofen. She was icing knee last night. She is worried about a blood clot.Shes never had a clot before. No trauma,  recent surgery,  hormonal contraceptives. No IV drug use. No history of gout. No fever.    Past Medical History:  Diagnosis Date  . Complication of anesthesia   . Eczema   . Family history of anesthesia complication    Mother - confusion  . Fatty liver   . Heart murmur    Slight - "nothing to worry about"  . Hemorrhoid   . Hypertension   . PONV (postoperative nausea and vomiting)     Patient Active Problem List   Diagnosis Date Noted  . Right knee pain 03/05/2017  . Abdominal fullness in right upper quadrant 01/13/2015  . Sebaceous cyst 09/13/2014  . Change in vision 05/06/2014  . Health care maintenance 05/06/2014  . BMI 40.0-44.9, adult ([place]) 09/05/2013  . Abnormal liver function 01/08/2012  . Hypercholesterolemia 01/08/2012  . Hematuria 01/08/2012  . Hyperglycemia 01/08/2012  . Hypertension 01/04/2012  . Lumbar herniated disc 12/15/2011    Past Surgical History:  Procedure Laterality Date  . CESAREAN SECTION    . FINGER SURGERY     pin to 3rd finger Right hand  . LUMBAR LAMINECTOMY/DECOMPRESSION MICRODISCECTOMY  12/15/2011   Procedure: LUMBAR  LAMINECTOMY/DECOMPRESSION MICRODISCECTOMY 1 LEVEL;  Surgeon: Ophelia Charter, MD;  Location: [facility];  Service: Neurosurgery;  Laterality: Right;  RIGHT Lumbar four-Five diskectomy  . WISDOM TOOTH EXTRACTION      Prior to Admission medications   Medication Sig Start Date End Date Taking? Authorizing Provider  hydrochlorothiazide (MICROZIDE) 12.5 MG capsule hydrochlorothiazide 12.5 mg capsule  TAKE 1 TO 2 CAPSULES BY MOUTH EVERY DAY    [provider]  hydrocortisone (ANUSOL-HC) 25 MG suppository Place 1 suppository (25 mg total) rectally 2 (two) times daily. 09/10/17   Laban Emperor, PA-C  ibuprofen (ADVIL,MOTRIN) 600 MG tablet Take 1 tablet (600 mg total) by mouth every 6 (six) hours as needed. 09/10/17   Laban Emperor, PA-C  lisinopril (PRINIVIL,ZESTRIL) 5 MG tablet Take 1 tablet (5 mg total) by mouth daily. 04/11/17   Einar Pheasant, MD  mupirocin ointment (BACTROBAN) 2 % Place 1 application into the nose 2 (two) times daily. Patient not taking: Reported on 02/25/2017 07/07/16   Einar Pheasant, MD    Allergies Other and Morphine and related  Family History  Problem Relation Age of Onset  . GER disease Mother   . Diabetes Father   . Cancer Father        Melanoma, Prostate  . Heart disease Father   . Heart disease Maternal Grandfather   . Diabetes Paternal Grandfather   . Breast cancer Sister 43    Social History Social History   Tobacco Use  .  Smoking status: Never Smoker  . Smokeless tobacco: Never Used  Substance Use Topics  . Alcohol use: Yes    Alcohol/week: 0.0 oz    Comment: rare  . Drug use: No     Review of Systems  Constitutional: No fever/chills Cardiovascular: No chest pain. Respiratory: No cough. No SOB. Gastrointestinal: No nausea, no vomiting.  Musculoskeletal: Positive for knee pain. No back pain. No hip pain. No calf pain.  Skin: Negative for rash, abrasions, lacerations, ecchymosis. Neurological: Negative for numbness or  tingling   ____________________________________________   PHYSICAL EXAM:  VITAL SIGNS: ED Triage Vitals  Enc Vitals Group     BP 09/10/17 0852 137/79     Pulse Rate 09/10/17 0852 86     Resp 09/10/17 0852 18     Temp 09/10/17 0852 98.6 F (37 C)     Temp Source 09/10/17 0852 Oral     SpO2 09/10/17 0852 96 %     Weight 09/10/17 0853 250 lb (113.4 kg)     Height 09/10/17 0853 5\' 6"  (1.676 m)     Head Circumference --      Peak Flow --      Pain Score 09/10/17 0855 7     Pain Loc --      Pain Edu? --      Excl. in [place]? --      Constitutional: Alert and oriented. Well appearing and in no acute distress. Eyes: Conjunctivae are normal. PERRL. EOMI. Head: Atraumatic. ENT:      Ears:      Nose: No congestion/rhinnorhea.      Mouth/Throat: Mucous membranes are moist.  Neck: No stridor.  Cardiovascular: Normal rate, regular rhythm.  Good peripheral circulation. Respiratory: Normal respiratory effort without tachypnea or retractions. Lungs CTAB. Good air entry to the bases with no decreased or absent breath sounds. Musculoskeletal: Full range of motion to all extremities. No gross deformities appreciated. Tenderness to palpation over medial knee in joint line and over popliteal fossa. Full ROM of knee. No warmth or erythema. No calf tenderness. Negative anterior drawer, posterior drawer, valgus, varus, mcMurray, patella apprehension, apley grind. Antalgic gait.  Rectal: No tenderness to palpation.  No erythema.  No visible hemorrhoids or abscess. Neurologic:  Normal speech and language. No gross focal neurologic deficits are appreciated.  Skin:  Skin is warm, dry and intact. No rash noted. Psychiatric: Mood and affect are normal. Speech and behavior are normal. Patient exhibits appropriate insight and judgement.   ____________________________________________   LABS (all labs ordered are listed, but only abnormal results are displayed)  Labs Reviewed - No data to  display ____________________________________________  EKG   ____________________________________________  RADIOLOGY Robinette Haines, personally viewed and evaluated these images (plain radiographs) as part of my medical decision making, as well as reviewing the written report by the radiologist.  US Venous Img Lower Unilateral Left  Result Date: 09/10/2017 CLINICAL DATA:  Leg pain for 6 days EXAM: LEFT LOWER EXTREMITY VENOUS DOPPLER ULTRASOUND TECHNIQUE: Gray-scale sonography with graded compression, as well as color Doppler and duplex ultrasound were performed to evaluate the lower extremity deep venous systems from the level of the common femoral vein and including the common femoral, femoral, profunda femoral, popliteal and calf veins including the posterior tibial, peroneal and gastrocnemius veins when visible. The superficial great saphenous vein was also interrogated. Spectral Doppler was utilized to evaluate flow at rest and with distal augmentation maneuvers in the common femoral, femoral and popliteal veins. COMPARISON:  None.  FINDINGS: Contralateral Common Femoral Vein: Respiratory phasicity is normal and symmetric with the symptomatic side. No evidence of thrombus. Normal compressibility. Common Femoral Vein: No evidence of thrombus. Normal compressibility, respiratory phasicity and response to augmentation. Saphenofemoral Junction: No evidence of thrombus. Normal compressibility and flow on color Doppler imaging. Profunda Femoral Vein: No evidence of thrombus. Normal compressibility and flow on color Doppler imaging. Femoral Vein: No evidence of thrombus. Normal compressibility, respiratory phasicity and response to augmentation. Popliteal Vein: No evidence of thrombus. Normal compressibility, respiratory phasicity and response to augmentation. Calf Veins: No evidence of thrombus. Normal compressibility and flow on color Doppler imaging. Superficial Great Saphenous Vein: No evidence of  thrombus. Normal compressibility. IMPRESSION: No evidence of deep venous thrombosis. Electronically Signed   By: Suzy Bouchard M.D.   On: 09/10/2017 12:13   Dg Knee Complete 4 Views Left  Result Date: 09/10/2017 CLINICAL DATA:  Left knee pain and swelling 1 week.  No injury. EXAM: LEFT KNEE - COMPLETE 4+ VIEW COMPARISON:  None. FINDINGS: No evidence of fracture, dislocation, or joint effusion. No evidence of arthropathy or other focal bone abnormality. Soft tissues are unremarkable. IMPRESSION: Negative. Electronically Signed   By: Marin Olp M.D.   On: 09/10/2017 09:50    ____________________________________________    PROCEDURES  Procedure(s) performed:    Procedures    Medications  ketorolac (TORADOL) 30 MG/ML injection 30 mg (30 mg Intramuscular Given 09/10/17 1249)     ____________________________________________   INITIAL IMPRESSION / ASSESSMENT AND PLAN / ED COURSE  Pertinent labs & imaging results that were available during my care of the patient were reviewed by me and considered in my medical decision making (see chart for details).  Review of the [place] CSRS was performed in accordance of the [place] prior to dispensing any controlled drugs.     Patient presented to the emergency department for evaluation of knee pain for 1 week.  Vital signs and exam are reassuring.  Knee x-ray negative for acute bony abnormalities.  Ultrasound negative for DVT.  Knee was ace wrapped.  Crutches were given.  At discharge, patient states that she has hemorrhoids and had some rectal pain with straining during a bowel movement yesterday.  Her primary care provider gives her hydrocortisone suppositories. She does not want any further work-up of this, including bloodwork or imaging.  Patient will be discharged home with prescriptions for ibuprofen and hydrocortisone suppository. Patient is to follow up with PCP and ortho as directed. Patient is given ED precautions to return to the ED for any  worsening or new symptoms.     ____________________________________________  FINAL CLINICAL IMPRESSION(S) / ED DIAGNOSES  Final diagnoses:  Acute pain of left knee  Hemorrhoids, unspecified hemorrhoid type      NEW MEDICATIONS STARTED DURING THIS VISIT:  ED Discharge Orders        Ordered    hydrocortisone (ANUSOL-HC) 25 MG suppository  2 times daily     09/10/17 1302    ibuprofen (ADVIL,MOTRIN) 600 MG tablet  Every 6 hours PRN     09/10/17 1302    Knee brace     09/10/17 1303          This chart was dictated using voice recognition software/Dragon. Despite best efforts to proofread, errors can occur which can change the meaning. Any change was purely unintentional.    Laban Emperor, PA-C 09/11/17 2117    Clearnce Hasten Randall An, MD 09/14/17 [phone]

## 2017-09-10 NOTE — ED Notes (Signed)
Pt able to properly demonstrate use of crutches.  Instructed on ACE wrap use and follow up instructions.

## 2017-09-10 NOTE — ED Triage Notes (Signed)
Pt arrived via POV with reports of left knee pain for the past week, pt states she has had pain when walking. Pt states it is painful to bear weight on left knee and is walking with a limp.

## 2017-09-11 ENCOUNTER — Encounter: Payer: Self-pay | Admitting: Internal Medicine

## 2017-09-25 DIAGNOSIS — M1712 Unilateral primary osteoarthritis, left knee: Secondary | ICD-10-CM | POA: Diagnosis not present

## 2017-09-25 DIAGNOSIS — M25562 Pain in left knee: Secondary | ICD-10-CM | POA: Diagnosis not present

## 2017-09-25 DIAGNOSIS — M25361 Other instability, right knee: Secondary | ICD-10-CM | POA: Diagnosis not present

## 2017-09-25 DIAGNOSIS — M25362 Other instability, left knee: Secondary | ICD-10-CM | POA: Diagnosis not present

## 2017-10-05 DIAGNOSIS — H0100A Unspecified blepharitis right eye, upper and lower eyelids: Secondary | ICD-10-CM | POA: Diagnosis not present

## 2017-10-05 DIAGNOSIS — H0100B Unspecified blepharitis left eye, upper and lower eyelids: Secondary | ICD-10-CM | POA: Diagnosis not present

## 2017-10-05 DIAGNOSIS — H00011 Hordeolum externum right upper eyelid: Secondary | ICD-10-CM | POA: Diagnosis not present

## 2017-10-14 ENCOUNTER — Other Ambulatory Visit: Payer: Self-pay | Admitting: Internal Medicine

## 2017-11-06 ENCOUNTER — Telehealth: Payer: Self-pay

## 2017-11-06 NOTE — Telephone Encounter (Signed)
Copied from [place] [id number]. Topic: Appointment Scheduling - Scheduling Inquiry for Clinic >> Nov 06, 2017  1:36 PM Joy Houston, [place] wrote: Reason for CRM: Pt received call to r/s 9/24 appt. Dr. Bary Leriche first available is 02/13/18. Please call pt in order to get her in sooner if possible. Requesting after 3pm.

## 2017-11-07 NOTE — Telephone Encounter (Signed)
Did you call this patient to r/s her appt?

## 2017-11-07 NOTE — Telephone Encounter (Signed)
Called and scheduled

## 2017-11-14 ENCOUNTER — Ambulatory Visit: Payer: BLUE CROSS/BLUE SHIELD | Admitting: Internal Medicine

## 2017-11-17 ENCOUNTER — Ambulatory Visit: Payer: BLUE CROSS/BLUE SHIELD | Admitting: Internal Medicine

## 2017-11-17 ENCOUNTER — Encounter: Payer: Self-pay | Admitting: Internal Medicine

## 2017-11-17 VITALS — BP 130/90 | HR 75 | Temp 98.0°F | Resp 18 | Ht 66.0 in | Wt 263.0 lb

## 2017-11-17 DIAGNOSIS — R002 Palpitations: Secondary | ICD-10-CM

## 2017-11-17 DIAGNOSIS — R319 Hematuria, unspecified: Secondary | ICD-10-CM

## 2017-11-17 DIAGNOSIS — I10 Essential (primary) hypertension: Secondary | ICD-10-CM

## 2017-11-17 DIAGNOSIS — E7800 Pure hypercholesterolemia, unspecified: Secondary | ICD-10-CM

## 2017-11-17 DIAGNOSIS — R079 Chest pain, unspecified: Secondary | ICD-10-CM | POA: Diagnosis not present

## 2017-11-17 DIAGNOSIS — Z6841 Body Mass Index (BMI) 40.0 and over, adult: Secondary | ICD-10-CM

## 2017-11-17 DIAGNOSIS — R739 Hyperglycemia, unspecified: Secondary | ICD-10-CM

## 2017-11-17 DIAGNOSIS — R945 Abnormal results of liver function studies: Secondary | ICD-10-CM

## 2017-11-17 LAB — CBC WITH DIFFERENTIAL/PLATELET
BASOS PCT: 0.5 % (ref 0.0–3.0)
Basophils Absolute: 0 10*3/uL (ref 0.0–0.1)
EOS PCT: 1.1 % (ref 0.0–5.0)
Eosinophils Absolute: 0.1 10*3/uL (ref 0.0–0.7)
HCT: 41.9 % (ref 36.0–46.0)
Hemoglobin: 14.3 g/dL (ref 12.0–15.0)
LYMPHS ABS: 2.1 10*3/uL (ref 0.7–4.0)
Lymphocytes Relative: 23.9 % (ref 12.0–46.0)
MCHC: 34.2 g/dL (ref 30.0–36.0)
MCV: 90.9 fl (ref 78.0–100.0)
MONO ABS: 0.6 10*3/uL (ref 0.1–1.0)
Monocytes Relative: 6.9 % (ref 3.0–12.0)
NEUTROS PCT: 67.6 % (ref 43.0–77.0)
Neutro Abs: 6 10*3/uL (ref 1.4–7.7)
Platelets: 237 10*3/uL (ref 150.0–400.0)
RBC: 4.61 Mil/uL (ref 3.87–5.11)
RDW: 13.4 % (ref 11.5–15.5)
WBC: 8.8 10*3/uL (ref 4.0–10.5)

## 2017-11-17 LAB — BASIC METABOLIC PANEL
BUN: 15 mg/dL (ref 6–23)
CALCIUM: 9.2 mg/dL (ref 8.4–10.5)
CHLORIDE: 104 meq/L (ref 96–112)
CO2: 28 mEq/L (ref 19–32)
CREATININE: 0.7 mg/dL (ref 0.40–1.20)
GFR: 94.04 mL/min (ref >60.00)
Glucose, Bld: 110 mg/dL — ABNORMAL HIGH (ref 70–99)
Potassium: 4.2 mEq/L (ref 3.5–5.1)
Sodium: 140 mEq/L (ref 135–145)

## 2017-11-17 LAB — URINALYSIS, ROUTINE W REFLEX MICROSCOPIC
BILIRUBIN URINE: NEGATIVE
Ketones, ur: NEGATIVE
Leukocytes, UA: NEGATIVE
NITRITE: NEGATIVE
Specific Gravity, Urine: 1.025 (ref 1.000–1.030)
TOTAL PROTEIN, URINE-UPE24: NEGATIVE
Urine Glucose: NEGATIVE
Urobilinogen, UA: 0.2 (ref 0.0–1.0)
WBC UA: NONE SEEN (ref 0–?)
pH: 5.5 (ref 5.0–8.0)

## 2017-11-17 LAB — LIPID PANEL
CHOLESTEROL: 175 mg/dL (ref 0–200)
HDL: 55.6 mg/dL (ref >39.00)
LDL Cholesterol: 100 mg/dL — ABNORMAL HIGH (ref 0–99)
NonHDL: 119.17
Total CHOL/HDL Ratio: 3
Triglycerides: 94 mg/dL (ref 0.0–149.0)
VLDL: 18.8 mg/dL (ref 0.0–40.0)

## 2017-11-17 LAB — HEMOGLOBIN A1C: Hgb A1c MFr Bld: 6.5 % (ref 4.6–6.5)

## 2017-11-17 LAB — HEPATIC FUNCTION PANEL
ALT: 41 U/L — AB (ref 0–35)
AST: 24 U/L (ref 0–37)
Albumin: 4 g/dL (ref 3.5–5.2)
Alkaline Phosphatase: 71 U/L (ref 39–117)
BILIRUBIN DIRECT: 0.1 mg/dL (ref 0.0–0.3)
BILIRUBIN TOTAL: 0.5 mg/dL (ref 0.2–1.2)
Total Protein: 6.9 g/dL (ref 6.0–8.3)

## 2017-11-17 LAB — TSH: TSH: 2.63 u[IU]/mL (ref 0.35–4.50)

## 2017-11-17 LAB — FOLLICLE STIMULATING HORMONE: FSH: 7.1 m[IU]/mL

## 2017-11-17 NOTE — Progress Notes (Signed)
Patient ID: Joy Houston, female   DOB: [birth year], 52 y.o.   MRN: [id number]   Subjective:    Patient ID: Joy Houston, female    DOB: 09/20/1965, 52 y.o.   MRN: [id number]  HPI  Patient here for a scheduled follow up.  She reports she is doing relatively well.  Not exercising.  Has gained weight.  Not watching her diet as well.  Has been having problems with her knees.  S/p injection previously.  Knees doing better.  Does report noticing some palpitations and some sob with exertion.  Some discomfort in her chest previously.  No abdominal pain.  Bowels moving.  She is concerned regarding possible menopause.  Had ablation.  Does not have periods.  States has been more moody lately.  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  Feels may be related to hormonal changes.     Past Medical History:  Diagnosis Date  . Complication of anesthesia   . Eczema   . Family history of anesthesia complication    Mother - confusion  . Fatty liver   . Heart murmur    Slight - "nothing to worry about"  . Hemorrhoid   . Hypertension   . PONV (postoperative nausea and vomiting)    Past Surgical History:  Procedure Laterality Date  . CESAREAN SECTION    . FINGER SURGERY     pin to 3rd finger Right hand  . LUMBAR LAMINECTOMY/DECOMPRESSION MICRODISCECTOMY  12/15/2011   Procedure: LUMBAR LAMINECTOMY/DECOMPRESSION MICRODISCECTOMY 1 LEVEL;  Surgeon: Ophelia Charter, MD;  Location: [place] [facility];  Service: Neurosurgery;  Laterality: Right;  RIGHT Lumbar four-Five diskectomy  . WISDOM TOOTH EXTRACTION     Family History  Problem Relation Age of Onset  . GER disease Mother   . Diabetes Father   . Cancer Father        Melanoma, Prostate  . Heart disease Father   . Heart disease Maternal Grandfather   . Diabetes Paternal Grandfather   . Breast cancer Sister 34   Social History   Socioeconomic History  . Marital status: Married    Spouse name: Not on file  . Number of children: 2  .  Years of education: Not on file  . Highest education level: Not on file  Occupational History    Employer: [employer]  . Financial resource strain: Not on file  . Food insecurity:    Worry: Not on file    Inability: Not on file  . Transportation needs:    Medical: Not on file    Non-medical: Not on file  Tobacco Use  . Smoking status: Never Smoker  . Smokeless tobacco: Never Used  Substance and Sexual Activity  . Alcohol use: Yes    Alcohol/week: 0.0 standard drinks    Comment: rare  . Drug use: No  . Sexual activity: Not on file  Lifestyle  . Physical activity:    Days per week: Not on file    Minutes per session: Not on file  . Stress: Not on file  Relationships  . Social connections:    Talks on phone: Not on file    Gets together: Not on file    Attends religious service: Not on file    Active member of club or organization: Not on file    Attends meetings of clubs or organizations: Not on file    Relationship status: Not on file  Other Topics Concern  . Not  on file  Social History Narrative  . Not on file    Outpatient Encounter Medications as of 11/17/2017  Medication Sig  . hydrochlorothiazide (MICROZIDE) 12.5 MG capsule TAKE 1 TO 2 CAPSULES BY MOUTH EVERY DAY  . ibuprofen (ADVIL,MOTRIN) 600 MG tablet Take 1 tablet (600 mg total) by mouth every 6 (six) hours as needed.  Marland Kitchen lisinopril (PRINIVIL,ZESTRIL) 5 MG tablet TAKE 1 TABLET BY MOUTH EVERY DAY  . [DISCONTINUED] hydrocortisone (ANUSOL-HC) 25 MG suppository Place 1 suppository (25 mg total) rectally 2 (two) times daily.  . [DISCONTINUED] mupirocin ointment (BACTROBAN) 2 % Place 1 application into the nose 2 (two) times daily. (Patient not taking: Reported on 02/25/2017)   No facility-administered encounter medications on file as of 11/17/2017.     Review of Systems  Constitutional: Negative for appetite change and unexpected weight change.  HENT: Negative for congestion and sinus pressure.    Respiratory: Positive for shortness of breath. Negative for cough and chest tightness.   Cardiovascular: Positive for chest pain. Negative for leg swelling.  Gastrointestinal: Negative for abdominal pain, diarrhea, nausea and vomiting.  Genitourinary: Negative for difficulty urinating and dysuria.  Musculoskeletal: Negative for joint swelling and myalgias.       Has had issues with her knees.    Skin: Negative for color change and rash.  Neurological: Negative for dizziness, light-headedness and headaches.  Psychiatric/Behavioral: Negative for agitation and dysphoric mood.       Objective:     Blood pressure rechecked by me:  130/88-90  Physical Exam  Constitutional: She appears well-developed and well-nourished. No distress.  HENT:  Nose: Nose normal.  Mouth/Throat: Oropharynx is clear and moist.  Neck: Neck supple. No thyromegaly present.  Cardiovascular: Normal rate and regular rhythm.  Pulmonary/Chest: Breath sounds normal. No respiratory distress. She has no wheezes.  Abdominal: Soft. Bowel sounds are normal. There is no tenderness.  Musculoskeletal: She exhibits no edema or tenderness.  Lymphadenopathy:    She has no cervical adenopathy.  Skin: No rash noted. No erythema.  Psychiatric: She has a normal mood and affect. Her behavior is normal.    BP 130/90   Pulse 75   Temp 98 F (36.7 C) (Oral)   Resp 18   Ht 5' 6"  (1.676 m)   Wt 263 lb (119.3 kg)   SpO2 96%   BMI 42.45 kg/m  Wt Readings from Last 3 Encounters:  11/17/17 263 lb (119.3 kg)  09/10/17 250 lb (113.4 kg)  06/05/17 254 lb 3.2 oz (115.3 kg)     Lab Results  Component Value Date   WBC 8.8 11/17/2017   HGB 14.3 11/17/2017   HCT 41.9 11/17/2017   PLT 237.0 11/17/2017   GLUCOSE 110 (H) 11/17/2017   CHOL 175 11/17/2017   TRIG 94.0 11/17/2017   HDL 55.60 11/17/2017   LDLDIRECT 145.4 03/01/2013   LDLCALC 100 (H) 11/17/2017   ALT 41 (H) 11/17/2017   AST 24 11/17/2017   NA 140 11/17/2017   K  4.2 11/17/2017   CL 104 11/17/2017   CREATININE 0.70 11/17/2017   BUN 15 11/17/2017   CO2 28 11/17/2017   TSH 2.63 11/17/2017   HGBA1C 6.5 11/17/2017   MICROALBUR 0.9 05/06/2014    US Venous Img Lower Unilateral Left  Result Date: 09/10/2017 CLINICAL DATA:  Leg pain for 6 days EXAM: LEFT LOWER EXTREMITY VENOUS DOPPLER ULTRASOUND TECHNIQUE: Gray-scale sonography with graded compression, as well as color Doppler and duplex ultrasound were performed to evaluate the lower extremity  deep venous systems from the level of the common femoral vein and including the common femoral, femoral, profunda femoral, popliteal and calf veins including the posterior tibial, peroneal and gastrocnemius veins when visible. The superficial great saphenous vein was also interrogated. Spectral Doppler was utilized to evaluate flow at rest and with distal augmentation maneuvers in the common femoral, femoral and popliteal veins. COMPARISON:  None. FINDINGS: Contralateral Common Femoral Vein: Respiratory phasicity is normal and symmetric with the symptomatic side. No evidence of thrombus. Normal compressibility. Common Femoral Vein: No evidence of thrombus. Normal compressibility, respiratory phasicity and response to augmentation. Saphenofemoral Junction: No evidence of thrombus. Normal compressibility and flow on color Doppler imaging. Profunda Femoral Vein: No evidence of thrombus. Normal compressibility and flow on color Doppler imaging. Femoral Vein: No evidence of thrombus. Normal compressibility, respiratory phasicity and response to augmentation. Popliteal Vein: No evidence of thrombus. Normal compressibility, respiratory phasicity and response to augmentation. Calf Veins: No evidence of thrombus. Normal compressibility and flow on color Doppler imaging. Superficial Great Saphenous Vein: No evidence of thrombus. Normal compressibility. IMPRESSION: No evidence of deep venous thrombosis. Electronically Signed   By: Suzy Bouchard M.D.   On: 09/10/2017 12:13   Dg Knee Complete 4 Views Left  Result Date: 09/10/2017 CLINICAL DATA:  Left knee pain and swelling 1 week.  No injury. EXAM: LEFT KNEE - COMPLETE 4+ VIEW COMPARISON:  None. FINDINGS: No evidence of fracture, dislocation, or joint effusion. No evidence of arthropathy or other focal bone abnormality. Soft tissues are unremarkable. IMPRESSION: Negative. Electronically Signed   By: Marin Olp M.D.   On: 09/10/2017 09:50       Assessment & Plan:   Problem List Items Addressed This Visit    Abnormal liver function    Discussed diet, exercise and weight loss.  Follow liver function tests.        BMI 40.0-44.9, adult (HCC)    Diet and exercise.  Follow.       Chest pain - Primary    Chest discomfort as outlined.  EKG - SR with no acute ischemic changes.  Discussed further w/up including referral to cardiology. She declines.  Wants to follow.  Will notify me if she changes her mind or if any worsening/recurring symptoms.        Relevant Orders   EKG 12-Lead (Completed)   Hematuria    Saw Dr Bernardo Heater previously.  Recheck urinalysis.        Hypercholesterolemia    Low cholesterol diet and exercise.  Follow lipid panel.       Hyperglycemia    Low carb diet and exercise.  Follow met b and a1c.        Hypertension    Blood pressure elevated today.  Have her spot check her pressure.  Get her back in soon to reassess.  Hold on making any changes in medication.         Other Visit Diagnoses    Palpitations       Relevant Orders   FSH (Completed)       Einar Pheasant, MD

## 2017-11-19 ENCOUNTER — Encounter: Payer: Self-pay | Admitting: Internal Medicine

## 2017-11-19 DIAGNOSIS — R079 Chest pain, unspecified: Secondary | ICD-10-CM | POA: Insufficient documentation

## 2017-11-19 NOTE — Assessment & Plan Note (Signed)
Discussed diet, exercise and weight loss.  Follow liver function tests.   

## 2017-11-19 NOTE — Assessment & Plan Note (Signed)
Low carb diet and exercise.  Follow met b and a1c.   

## 2017-11-19 NOTE — Assessment & Plan Note (Signed)
Saw Dr Bernardo Heater previously.  Recheck urinalysis.

## 2017-11-19 NOTE — Assessment & Plan Note (Signed)
Low cholesterol diet and exercise.  Follow lipid panel.   

## 2017-11-19 NOTE — Assessment & Plan Note (Addendum)
Blood pressure elevated today.  Have her spot check her pressure.  Get her back in soon to reassess.  Hold on making any changes in medication.

## 2017-11-19 NOTE — Assessment & Plan Note (Signed)
Diet and exercise.  Follow.  

## 2017-11-19 NOTE — Assessment & Plan Note (Signed)
Chest discomfort as outlined.  EKG - SR with no acute ischemic changes.  Discussed further w/up including referral to cardiology. She declines.  Wants to follow.  Will notify me if she changes her mind or if any worsening/recurring symptoms.

## 2017-11-20 ENCOUNTER — Encounter: Payer: Self-pay | Admitting: Internal Medicine

## 2017-12-22 ENCOUNTER — Other Ambulatory Visit: Payer: Self-pay | Admitting: Internal Medicine

## 2017-12-22 DIAGNOSIS — Z1231 Encounter for screening mammogram for malignant neoplasm of breast: Secondary | ICD-10-CM

## 2018-01-08 ENCOUNTER — Ambulatory Visit: Payer: BLUE CROSS/BLUE SHIELD | Admitting: Internal Medicine

## 2018-01-08 VITALS — BP 138/88 | HR 85 | Temp 98.4°F | Resp 18 | Wt 259.6 lb

## 2018-01-08 DIAGNOSIS — Z6841 Body Mass Index (BMI) 40.0 and over, adult: Secondary | ICD-10-CM

## 2018-01-08 DIAGNOSIS — R000 Tachycardia, unspecified: Secondary | ICD-10-CM

## 2018-01-08 DIAGNOSIS — I10 Essential (primary) hypertension: Secondary | ICD-10-CM

## 2018-01-08 DIAGNOSIS — R945 Abnormal results of liver function studies: Secondary | ICD-10-CM | POA: Diagnosis not present

## 2018-01-08 DIAGNOSIS — E7800 Pure hypercholesterolemia, unspecified: Secondary | ICD-10-CM | POA: Diagnosis not present

## 2018-01-08 DIAGNOSIS — E119 Type 2 diabetes mellitus without complications: Secondary | ICD-10-CM

## 2018-01-08 NOTE — Progress Notes (Signed)
Patient ID: Joy Houston, female   DOB: Oct 12, 1965, 52 y.o.   MRN: [id number]   Subjective:    Patient ID: Joy Houston, female    DOB: April 20, 1965, 52 y.o.   MRN: [id number]  HPI  Patient here for a scheduled follow up.  She is concerned regarding a1c 6.5.  Discussed diabetes.  Discussed diet adjustment and exercise.  States she has been feeling a jittery feeling intermittently.  Felt prior to me coming into the room.  Some increased heart rate.  No syncope or near syncopal episodes.  No chest pain.  No sob.  No acid reflux.  No abdominal pain.  Bowels moving.  Joints better.  Handling stress.     Past Medical History:  Diagnosis Date  . Complication of anesthesia   . Eczema   . Family history of anesthesia complication    Mother - confusion  . Fatty liver   . Heart murmur    Slight - "nothing to worry about"  . Hemorrhoid   . Hypertension   . PONV (postoperative nausea and vomiting)    Past Surgical History:  Procedure Laterality Date  . CESAREAN SECTION    . FINGER SURGERY     pin to 3rd finger Right hand  . LUMBAR LAMINECTOMY/DECOMPRESSION MICRODISCECTOMY  12/15/2011   Procedure: LUMBAR LAMINECTOMY/DECOMPRESSION MICRODISCECTOMY 1 LEVEL;  Surgeon: Ophelia Charter, MD;  Location: [place];  Service: Neurosurgery;  Laterality: Right;  RIGHT Lumbar four-Five diskectomy  . WISDOM TOOTH EXTRACTION     Family History  Problem Relation Age of Onset  . GER disease Mother   . Diabetes Father   . Cancer Father        Melanoma, Prostate  . Heart disease Father   . Heart disease Maternal Grandfather   . Diabetes Paternal Grandfather   . Breast cancer Sister 23   Social History   Socioeconomic History  . Marital status: Married    Spouse name: Not on file  . Number of children: 2  . Years of education: Not on file  . Highest education level: Not on file  Occupational History    Employer: [employer]  . Financial resource strain: Not on file   . Food insecurity:    Worry: Not on file    Inability: Not on file  . Transportation needs:    Medical: Not on file    Non-medical: Not on file  Tobacco Use  . Smoking status: Never Smoker  . Smokeless tobacco: Never Used  Substance and Sexual Activity  . Alcohol use: Yes    Alcohol/week: 0.0 standard drinks    Comment: rare  . Drug use: No  . Sexual activity: Not on file  Lifestyle  . Physical activity:    Days per week: Not on file    Minutes per session: Not on file  . Stress: Not on file  Relationships  . Social connections:    Talks on phone: Not on file    Gets together: Not on file    Attends religious service: Not on file    Active member of club or organization: Not on file    Attends meetings of clubs or organizations: Not on file    Relationship status: Not on file  Other Topics Concern  . Not on file  Social History Narrative  . Not on file    Outpatient Encounter Medications as of 01/08/2018  Medication Sig  . hydrochlorothiazide (MICROZIDE) 12.5 MG capsule TAKE  1 TO 2 CAPSULES BY MOUTH EVERY DAY  . ibuprofen (ADVIL,MOTRIN) 600 MG tablet Take 1 tablet (600 mg total) by mouth every 6 (six) hours as needed.  Marland Kitchen lisinopril (PRINIVIL,ZESTRIL) 5 MG tablet TAKE 1 TABLET BY MOUTH EVERY DAY   No facility-administered encounter medications on file as of 01/08/2018.     Review of Systems  Constitutional: Negative for appetite change and unexpected weight change.  HENT: Negative for congestion and sinus pressure.   Respiratory: Negative for cough, chest tightness and shortness of breath.   Cardiovascular: Negative for chest pain and leg swelling.       Jittery feeling and increased heart rate as outlined.    Gastrointestinal: Negative for abdominal pain, diarrhea, nausea and vomiting.  Genitourinary: Negative for difficulty urinating and dysuria.  Musculoskeletal: Negative for joint swelling and myalgias.  Skin: Negative for color change and rash.    Neurological: Negative for dizziness, light-headedness and headaches.  Psychiatric/Behavioral: Negative for agitation and dysphoric mood.       Objective:    Physical Exam  Constitutional: She appears well-developed and well-nourished. No distress.  HENT:  Nose: Nose normal.  Mouth/Throat: Oropharynx is clear and moist.  Neck: Neck supple. No thyromegaly present.  Cardiovascular:  Auscultation at first revealed - increased heart rate.  After initial exam, heart rate slowed.  Appears to be regular.    Pulmonary/Chest: Breath sounds normal. No respiratory distress. She has no wheezes.  Abdominal: Soft. Bowel sounds are normal. There is no tenderness.  Musculoskeletal: She exhibits no edema or tenderness.  Lymphadenopathy:    She has no cervical adenopathy.  Skin: No rash noted. No erythema.  Psychiatric: She has a normal mood and affect. Her behavior is normal.    BP 138/88 (BP Location: Left Arm, Patient Position: Sitting, Cuff Size: Normal)   Pulse 85   Temp 98.4 F (36.9 C) (Oral)   Resp 18   Wt 259 lb 9.6 oz (117.8 kg)   SpO2 97%   BMI 41.90 kg/m  Wt Readings from Last 3 Encounters:  01/08/18 259 lb 9.6 oz (117.8 kg)  11/17/17 263 lb (119.3 kg)  09/10/17 250 lb (113.4 kg)     Lab Results  Component Value Date   WBC 8.8 11/17/2017   HGB 14.3 11/17/2017   HCT 41.9 11/17/2017   PLT 237.0 11/17/2017   GLUCOSE 110 (H) 11/17/2017   CHOL 175 11/17/2017   TRIG 94.0 11/17/2017   HDL 55.60 11/17/2017   LDLDIRECT 145.4 03/01/2013   LDLCALC 100 (H) 11/17/2017   ALT 41 (H) 11/17/2017   AST 24 11/17/2017   NA 140 11/17/2017   K 4.2 11/17/2017   CL 104 11/17/2017   CREATININE 0.70 11/17/2017   BUN 15 11/17/2017   CO2 28 11/17/2017   TSH 2.63 11/17/2017   HGBA1C 6.5 11/17/2017   MICROALBUR 0.9 05/06/2014    US Venous Img Lower Unilateral Left  Result Date: 09/10/2017 CLINICAL DATA:  Leg pain for 6 days EXAM: LEFT LOWER EXTREMITY VENOUS DOPPLER ULTRASOUND  TECHNIQUE: Gray-scale sonography with graded compression, as well as color Doppler and duplex ultrasound were performed to evaluate the lower extremity deep venous systems from the level of the common femoral vein and including the common femoral, femoral, profunda femoral, popliteal and calf veins including the posterior tibial, peroneal and gastrocnemius veins when visible. The superficial great saphenous vein was also interrogated. Spectral Doppler was utilized to evaluate flow at rest and with distal augmentation maneuvers in the common femoral, femoral  and popliteal veins. COMPARISON:  None. FINDINGS: Contralateral Common Femoral Vein: Respiratory phasicity is normal and symmetric with the symptomatic side. No evidence of thrombus. Normal compressibility. Common Femoral Vein: No evidence of thrombus. Normal compressibility, respiratory phasicity and response to augmentation. Saphenofemoral Junction: No evidence of thrombus. Normal compressibility and flow on color Doppler imaging. Profunda Femoral Vein: No evidence of thrombus. Normal compressibility and flow on color Doppler imaging. Femoral Vein: No evidence of thrombus. Normal compressibility, respiratory phasicity and response to augmentation. Popliteal Vein: No evidence of thrombus. Normal compressibility, respiratory phasicity and response to augmentation. Calf Veins: No evidence of thrombus. Normal compressibility and flow on color Doppler imaging. Superficial Great Saphenous Vein: No evidence of thrombus. Normal compressibility. IMPRESSION: No evidence of deep venous thrombosis. Electronically Signed   By: Suzy Bouchard M.D.   On: 09/10/2017 12:13   Dg Knee Complete 4 Views Left  Result Date: 09/10/2017 CLINICAL DATA:  Left knee pain and swelling 1 week.  No injury. EXAM: LEFT KNEE - COMPLETE 4+ VIEW COMPARISON:  None. FINDINGS: No evidence of fracture, dislocation, or joint effusion. No evidence of arthropathy or other focal bone abnormality.  Soft tissues are unremarkable. IMPRESSION: Negative. Electronically Signed   By: Marin Olp M.D.   On: 09/10/2017 09:50       Assessment & Plan:   Problem List Items Addressed This Visit    Abnormal liver function    Discussed diet, exercise and weight loss.  Follow liver function tests.        BMI 40.0-44.9, adult (HCC)    Discussed diet, exercise and weight loss.        Diabetes mellitus without complication ([place])    Discussed recent a1c 6.5. Discussed diabetes.  Discussed low carb diet and exercise.  Follow met b and a1c.  Discussed lifestyles referral.  Will notify me if wants to meet with nutritionist.  Follow.        Hypercholesterolemia    Low cholesterol diet and exercise.  Follow lipid panel.        Hypertension    Blood pressure a little elevated today.  Feel related to the increased stress, diabetes, etc.  She will spot check her pressure.  Get her back in soon to reassess.        Tachycardia - Primary    EKG - SR with ventricular rate 96.  No acute ischemic changes.  Had varying heart rate and increased heart rate initially on exam.  Slowed by the time EKG performed.  Discussed with her today.  Discussed further cardiac evaluation.  Will d/w cardiology.        Relevant Orders   EKG 12-Lead (Completed)       Einar Pheasant, MD

## 2018-01-14 ENCOUNTER — Encounter: Payer: Self-pay | Admitting: Internal Medicine

## 2018-01-14 DIAGNOSIS — R000 Tachycardia, unspecified: Secondary | ICD-10-CM | POA: Insufficient documentation

## 2018-01-14 NOTE — Assessment & Plan Note (Signed)
Blood pressure a little elevated today.  Feel related to the increased stress, diabetes, etc.  She will spot check her pressure.  Get her back in soon to reassess.

## 2018-01-14 NOTE — Assessment & Plan Note (Signed)
Discussed diet, exercise and weight loss.  Follow liver function tests.   

## 2018-01-14 NOTE — Assessment & Plan Note (Signed)
Discussed recent a1c 6.5. Discussed diabetes.  Discussed low carb diet and exercise.  Follow met b and a1c.  Discussed lifestyles referral.  Will notify me if wants to meet with nutritionist.  Follow.

## 2018-01-14 NOTE — Assessment & Plan Note (Signed)
Discussed diet, exercise and weight loss

## 2018-01-14 NOTE — Assessment & Plan Note (Signed)
EKG - SR with ventricular rate 96.  No acute ischemic changes.  Had varying heart rate and increased heart rate initially on exam.  Slowed by the time EKG performed.  Discussed with her today.  Discussed further cardiac evaluation.  Will d/w cardiology.

## 2018-01-14 NOTE — Assessment & Plan Note (Signed)
Low cholesterol diet and exercise.  Follow lipid panel.   

## 2018-01-16 ENCOUNTER — Telehealth: Payer: Self-pay | Admitting: Internal Medicine

## 2018-01-16 NOTE — Telephone Encounter (Signed)
-----   Message from Lars Masson, LPN sent at [phone]  8:16 AM EST ----- Regarding: RE: appt with Dr Nehemiah Massed I have notified patient of appt date and time and I also scheduled her for a follow up appt with you in 8 Weeks per AVS ----- Message ----- From: Einar Pheasant, MD Sent: 01/15/2018   5:13 PM EST To: Lars Masson, LPN Subject: appt with Dr Nehemiah Massed                          I saw her recently.  I had informed her that I was going to contact Dr Alveria Apley office regarding her increased heart rate, etc.  I spoke with cardiology.  They want to see her and scheduled her an appt on Wednesday 01/17/18 - 9:00. Please notify pt of appt date and time.  If a problem, she will need to call and reschedule.  Thanks.    Dr Nicki Reaper

## 2018-01-17 DIAGNOSIS — R002 Palpitations: Secondary | ICD-10-CM | POA: Diagnosis not present

## 2018-01-17 DIAGNOSIS — I10 Essential (primary) hypertension: Secondary | ICD-10-CM | POA: Diagnosis not present

## 2018-01-17 DIAGNOSIS — E782 Mixed hyperlipidemia: Secondary | ICD-10-CM | POA: Diagnosis not present

## 2018-01-17 DIAGNOSIS — G4733 Obstructive sleep apnea (adult) (pediatric): Secondary | ICD-10-CM | POA: Diagnosis not present

## 2018-01-22 ENCOUNTER — Encounter: Payer: Self-pay | Admitting: Internal Medicine

## 2018-01-22 ENCOUNTER — Other Ambulatory Visit: Payer: Self-pay | Admitting: Internal Medicine

## 2018-01-22 MED ORDER — LISINOPRIL 10 MG PO TABS: 10.0000 mg | ORAL_TABLET | Freq: Every day | ORAL | 1 refills | Status: DC

## 2018-01-22 NOTE — Progress Notes (Signed)
rx sent in for lisinopril 10mg  #90 with one refill.

## 2018-01-23 ENCOUNTER — Ambulatory Visit
Admission: RE | Admit: 2018-01-23 | Discharge: 2018-01-23 | Disposition: A | Discharge status: Home / Self Care | Payer: BLUE CROSS/BLUE SHIELD | Source: Ambulatory Visit | Attending: Internal Medicine | Admitting: Internal Medicine

## 2018-01-23 DIAGNOSIS — Z1231 Encounter for screening mammogram for malignant neoplasm of breast: Secondary | ICD-10-CM | POA: Diagnosis not present

## 2018-02-06 DIAGNOSIS — R002 Palpitations: Secondary | ICD-10-CM | POA: Diagnosis not present

## 2018-02-07 DIAGNOSIS — R002 Palpitations: Secondary | ICD-10-CM | POA: Diagnosis not present

## 2018-02-19 DIAGNOSIS — R002 Palpitations: Secondary | ICD-10-CM | POA: Diagnosis not present

## 2018-02-19 DIAGNOSIS — I480 Paroxysmal atrial fibrillation: Secondary | ICD-10-CM | POA: Diagnosis not present

## 2018-02-19 DIAGNOSIS — I10 Essential (primary) hypertension: Secondary | ICD-10-CM | POA: Diagnosis not present

## 2018-02-19 DIAGNOSIS — G4733 Obstructive sleep apnea (adult) (pediatric): Secondary | ICD-10-CM | POA: Diagnosis not present

## 2018-03-01 DIAGNOSIS — G4733 Obstructive sleep apnea (adult) (pediatric): Secondary | ICD-10-CM | POA: Insufficient documentation

## 2018-03-01 DIAGNOSIS — I480 Paroxysmal atrial fibrillation: Secondary | ICD-10-CM | POA: Insufficient documentation

## 2018-03-06 ENCOUNTER — Ambulatory Visit: Payer: BLUE CROSS/BLUE SHIELD | Admitting: Internal Medicine

## 2018-03-06 VITALS — BP 130/86 | HR 61 | Temp 97.9°F | Resp 16 | Wt 254.8 lb

## 2018-03-06 DIAGNOSIS — E7800 Pure hypercholesterolemia, unspecified: Secondary | ICD-10-CM

## 2018-03-06 DIAGNOSIS — E119 Type 2 diabetes mellitus without complications: Secondary | ICD-10-CM

## 2018-03-06 DIAGNOSIS — R945 Abnormal results of liver function studies: Secondary | ICD-10-CM | POA: Diagnosis not present

## 2018-03-06 DIAGNOSIS — I4891 Unspecified atrial fibrillation: Secondary | ICD-10-CM | POA: Diagnosis not present

## 2018-03-06 DIAGNOSIS — I480 Paroxysmal atrial fibrillation: Secondary | ICD-10-CM

## 2018-03-06 DIAGNOSIS — R600 Localized edema: Secondary | ICD-10-CM

## 2018-03-06 DIAGNOSIS — R0683 Snoring: Secondary | ICD-10-CM

## 2018-03-06 DIAGNOSIS — Z6841 Body Mass Index (BMI) 40.0 and over, adult: Secondary | ICD-10-CM

## 2018-03-06 DIAGNOSIS — I10 Essential (primary) hypertension: Secondary | ICD-10-CM

## 2018-03-06 NOTE — Progress Notes (Signed)
Patient ID: Joy Houston, female   DOB: 1966/01/02, 53 y.o.   MRN: [id number]   Subjective:    Patient ID: Joy Houston, female    DOB: 1965-09-15, 53 y.o.   MRN: [id number]  HPI  Patient here for a scheduled follow up.  States she has not felt as well lately.  Saw cardiology (last 02/19/18).  Holter monitor revealed short runs of atrial fibrillation and frequent PVCs.   Discussed possible sleep apnea.  Was started on toprol and sleep study recommended.  She reported that last week noticed left foot swelling.  Started with itching and then swelling.  Discussed with cardiology.   They had her hold toprol.  Has not taken since last week.  Reported right foot swelling last night.  Better now.  States again started with itching and then swells.  She has been elevating the foot and applying ice.  Better now.  Reports increased fatigue.  No chest pain.  Breathing appears to be stable.  Trying to adjust her diet and lose weight.  No vomiting.  Does snore.  Increased daytime somnolence.  Discussed diet and weight loss.  Agreeable to [facility] referral.      Past Medical History:  Diagnosis Date  . Complication of anesthesia   . Eczema   . Family history of anesthesia complication    Mother - confusion  . Fatty liver   . Heart murmur    Slight - "nothing to worry about"  . Hemorrhoid   . Hypertension   . PONV (postoperative nausea and vomiting)    Past Surgical History:  Procedure Laterality Date  . CESAREAN SECTION    . FINGER SURGERY     pin to 3rd finger Right hand  . LUMBAR LAMINECTOMY/DECOMPRESSION MICRODISCECTOMY  12/15/2011   Procedure: LUMBAR LAMINECTOMY/DECOMPRESSION MICRODISCECTOMY 1 LEVEL;  Surgeon: Ophelia Charter, MD;  Location: [facility];  Service: Neurosurgery;  Laterality: Right;  RIGHT Lumbar four-Five diskectomy  . WISDOM TOOTH EXTRACTION     Family History  Problem Relation Age of Onset  . GER disease Mother   . Diabetes Father   . Cancer Father    Melanoma, Prostate  . Heart disease Father   . Heart disease Maternal Grandfather   . Diabetes Paternal Grandfather   . Breast cancer Sister 33   Social History   Socioeconomic History  . Marital status: Married    Spouse name: Not on file  . Number of children: 2  . Years of education: Not on file  . Highest education level: Not on file  Occupational History    Employer: [employer]  . Financial resource strain: Not on file  . Food insecurity:    Worry: Not on file    Inability: Not on file  . Transportation needs:    Medical: Not on file    Non-medical: Not on file  Tobacco Use  . Smoking status: Never Smoker  . Smokeless tobacco: Never Used  Substance and Sexual Activity  . Alcohol use: Yes    Alcohol/week: 0.0 standard drinks    Comment: rare  . Drug use: No  . Sexual activity: Not on file  Lifestyle  . Physical activity:    Days per week: Not on file    Minutes per session: Not on file  . Stress: Not on file  Relationships  . Social connections:    Talks on phone: Not on file    Gets together: Not on file  Attends religious service: Not on file    Active member of club or organization: Not on file    Attends meetings of clubs or organizations: Not on file    Relationship status: Not on file  Other Topics Concern  . Not on file  Social History Narrative  . Not on file    Outpatient Encounter Medications as of 03/06/2018  Medication Sig  . hydrochlorothiazide (MICROZIDE) 12.5 MG capsule TAKE 1 TO 2 CAPSULES BY MOUTH EVERY DAY  . ibuprofen (ADVIL,MOTRIN) 600 MG tablet Take 1 tablet (600 mg total) by mouth every 6 (six) hours as needed.  Marland Kitchen lisinopril (PRINIVIL,ZESTRIL) 10 MG tablet Take 1 tablet (10 mg total) by mouth daily.   No facility-administered encounter medications on file as of 03/06/2018.     Review of Systems  Constitutional: Positive for fatigue.       Trying to adjust diet and lose weight.   HENT: Negative for  congestion and sinus pressure.   Respiratory: Negative for cough and chest tightness.        Breathing overall stable.   Cardiovascular: Negative for chest pain.       Foot swelling as outlined.    Gastrointestinal: Negative for abdominal pain, diarrhea and vomiting.  Musculoskeletal: Negative for joint swelling and myalgias.  Skin: Negative for color change and rash.  Neurological: Negative for dizziness, light-headedness and headaches.  Psychiatric/Behavioral: Negative for agitation and dysphoric mood.       Increased stress with current medical issues.         Objective:    Physical Exam Constitutional:      General: She is not in acute distress.    Appearance: Normal appearance.  HENT:     Nose: Nose normal. No congestion.     Mouth/Throat:     Pharynx: No oropharyngeal exudate or posterior oropharyngeal erythema.  Neck:     Musculoskeletal: Neck supple. No muscular tenderness.     Thyroid: No thyromegaly.  Cardiovascular:     Comments: Pulse rate initially approximately 150s.  Irregular.  After toprol, appeared to be regular with ventricular rate in the 70s.   Pulmonary:     Effort: No respiratory distress.     Breath sounds: Normal breath sounds. No wheezing.  Abdominal:     General: Bowel sounds are normal.     Palpations: Abdomen is soft.     Tenderness: There is no abdominal tenderness.  Musculoskeletal:        General: No tenderness.     Comments: Minimal pedal edema.  No leg swelling or erythema.    Lymphadenopathy:     Cervical: No cervical adenopathy.  Skin:    Findings: No erythema or rash.  Neurological:     Mental Status: She is alert.  Psychiatric:        Mood and Affect: Mood normal.        Behavior: Behavior normal.     BP 130/86 (BP Location: Left Arm, Patient Position: Sitting, Cuff Size: Large)   Pulse 61   Temp 97.9 F (36.6 C) (Oral)   Resp 16   Wt 254 lb 12.8 oz (115.6 kg)   SpO2 97%   BMI 41.13 kg/m  Wt Readings from Last 3  Encounters:  03/06/18 254 lb 12.8 oz (115.6 kg)  01/08/18 259 lb 9.6 oz (117.8 kg)  11/17/17 263 lb (119.3 kg)     Lab Results  Component Value Date   WBC 8.8 11/17/2017   HGB 14.3 11/17/2017  HCT 41.9 11/17/2017   PLT 237.0 11/17/2017   GLUCOSE 110 (H) 11/17/2017   CHOL 175 11/17/2017   TRIG 94.0 11/17/2017   HDL 55.60 11/17/2017   LDLDIRECT 145.4 03/01/2013   LDLCALC 100 (H) 11/17/2017   ALT 41 (H) 11/17/2017   AST 24 11/17/2017   NA 140 11/17/2017   K 4.2 11/17/2017   CL 104 11/17/2017   CREATININE 0.70 11/17/2017   BUN 15 11/17/2017   CO2 28 11/17/2017   TSH 2.63 11/17/2017   HGBA1C 6.5 11/17/2017   MICROALBUR 0.9 05/06/2014    Mm 3d Screen Breast Bilateral  Result Date: 01/23/2018 CLINICAL DATA:  Screening. EXAM: DIGITAL SCREENING BILATERAL MAMMOGRAM WITH TOMO AND CAD COMPARISON:  Previous exam(s). ACR Breast Density Category b: There are scattered areas of fibroglandular density. FINDINGS: There are no findings suspicious for malignancy. An external device overlies the left breast. Images were processed with CAD. IMPRESSION: No mammographic evidence of malignancy. A result letter of this screening mammogram will be mailed directly to the patient. RECOMMENDATION: Screening mammogram in one year. (Code:SM-B-01Y) BI-RADS CATEGORY  1: Negative. Electronically Signed   By: Everlean Alstrom M.D.   On: 01/23/2018 16:54       Assessment & Plan:   Problem List Items Addressed This Visit    A-fib ([place]) - Primary    Recently diagnosed with afb.  Reviewed cardiology's note.  Off toprol given concern was contributing to foot swelling.  Increased heart rate noted on exam today.  EKG - aflutter with ventricular rate 150.  Discussed with her cardiologist.  recommended restarting toprol and f/u with cardiology if needed.  Took one of her 25m toprol here in the office.  Was monitored for 30-40 minutes.  Prior to leaving office, rhythm appeared to be regular with ventricular rate in  the 70s.  Needs evaluation for sleep apnea.  Arrange. Restart toprol.  Planned f/u with cardiology.       Relevant Orders   EKG 12-Lead (Completed)   Ambulatory referral to Pulmonology   Abnormal liver function    She is adjusting her diet.  Discussed diet and weight loss.  Follow liver function tests.        BMI 40.0-44.9, adult (HCC)    Discussed diet and exercise.        Diabetes mellitus without complication (HCC)    Low carb diet and exercise.  Follow met b and a1c.  She is adjusting her diet.  Has lost some weight.  Refer to [facility] for diet instruction.        Relevant Orders   Ambulatory referral to diabetic education   Hypercholesterolemia    Low cholesterol diet and exercise.  Follow lipid panel.       Hypertension    Blood pressure as outlined.  Adding back toprol as outlined.  Follow met b.        Pedal edema    Feel is multifactorial.  No significant swelling on exam today.  Treat afib.  Evaluate for sleep apnea.  Discussed compression hose and leg elevation.  No leg swelling.        Snoring    Snoring with increased fatigue and daytime somnolence.  Overweight.  Afib.  Needs evaluation for sleep apnea.  Refer to pulmonary.  Avoid sleeping supine.  Avoid sedating medication.        Relevant Orders   Ambulatory referral to Pulmonology      I spent over 45 minutes with the patient and more than  50% of the time was spent in consultation regarding the above.  Time spent discussing her current symptoms and concerns.  Time also spent discussing with her and her husband regarding further evaluation and treatment.     Einar Pheasant, MD

## 2018-03-08 ENCOUNTER — Encounter: Payer: Self-pay | Admitting: Internal Medicine

## 2018-03-08 DIAGNOSIS — R0683 Snoring: Secondary | ICD-10-CM | POA: Insufficient documentation

## 2018-03-08 DIAGNOSIS — I4891 Unspecified atrial fibrillation: Secondary | ICD-10-CM | POA: Insufficient documentation

## 2018-03-08 DIAGNOSIS — R600 Localized edema: Secondary | ICD-10-CM | POA: Insufficient documentation

## 2018-03-08 NOTE — Assessment & Plan Note (Signed)
Low cholesterol diet and exercise.  Follow lipid panel.   

## 2018-03-08 NOTE — Assessment & Plan Note (Signed)
Low carb diet and exercise.  Follow met b and a1c.  She is adjusting her diet.  Has lost some weight.  Refer to [facility] for diet instruction.

## 2018-03-08 NOTE — Assessment & Plan Note (Signed)
Discussed diet and exercise 

## 2018-03-08 NOTE — Assessment & Plan Note (Signed)
Feel is multifactorial.  No significant swelling on exam today.  Treat afib.  Evaluate for sleep apnea.  Discussed compression hose and leg elevation.  No leg swelling.

## 2018-03-08 NOTE — Assessment & Plan Note (Signed)
Blood pressure as outlined.  Adding back toprol as outlined.  Follow met b.

## 2018-03-08 NOTE — Assessment & Plan Note (Signed)
Snoring with increased fatigue and daytime somnolence.  Overweight.  Afib.  Needs evaluation for sleep apnea.  Refer to pulmonary.  Avoid sleeping supine.  Avoid sedating medication.

## 2018-03-08 NOTE — Assessment & Plan Note (Signed)
She is adjusting her diet.  Discussed diet and weight loss.  Follow liver function tests.

## 2018-03-08 NOTE — Assessment & Plan Note (Signed)
Recently diagnosed with afb.  Reviewed cardiology's note.  Off toprol given concern was contributing to foot swelling.  Increased heart rate noted on exam today.  EKG - aflutter with ventricular rate 150.  Discussed with her cardiologist.  recommended restarting toprol and f/u with cardiology if needed.  Took one of her 25mg  toprol here in the office.  Was monitored for 30-40 minutes.  Prior to leaving office, rhythm appeared to be regular with ventricular rate in the 70s.  Needs evaluation for sleep apnea.  Arrange. Restart toprol.  Planned f/u with cardiology.

## 2018-03-09 ENCOUNTER — Encounter: Payer: Self-pay | Admitting: Internal Medicine

## 2018-03-09 NOTE — Telephone Encounter (Signed)
Left message for referrals at pulmonary

## 2018-03-09 NOTE — Telephone Encounter (Signed)
Pt scheduled for 1/20 with pulmonary. Pt aware

## 2018-03-09 NOTE — Telephone Encounter (Signed)
Can you help with this.  Do you know where we stand with getting a pulmonary appt

## 2018-03-12 ENCOUNTER — Ambulatory Visit: Payer: BLUE CROSS/BLUE SHIELD | Admitting: Internal Medicine

## 2018-03-12 ENCOUNTER — Encounter: Payer: Self-pay | Admitting: Internal Medicine

## 2018-03-12 VITALS — BP 128/78 | HR 72 | Ht 65.0 in | Wt 253.4 lb

## 2018-03-12 DIAGNOSIS — G4719 Other hypersomnia: Secondary | ICD-10-CM | POA: Diagnosis not present

## 2018-03-12 NOTE — Patient Instructions (Signed)
Will send for home sleep study.    Sleep Apnea    Sleep apnea is disorder that affects a person's sleep. A person with sleep apnea has abnormal pauses in their breathing when they sleep. It is hard for them to get a good sleep. This makes a person tired during the day. It also can lead to other physical problems. There are three types of sleep apnea. One type is when breathing stops for a short time because your airway is blocked (obstructive sleep apnea). Another type is when the brain sometimes fails to give the normal signal to breathe to the muscles that control your breathing (central sleep apnea). The third type is a combination of the other two types.  HOME CARE   Take all medicine as told by your doctor.  Avoid alcohol, calming medicines (sedatives), and depressant drugs.  Try to lose weight if you are overweight. Talk to your doctor about a healthy weight goal.  Your doctor may have you use a device that helps to open your airway. It can help you get the air that you need. It is called a positive airway pressure (PAP) device.   MAKE SURE YOU:   Understand these instructions.  Will watch your condition.  Will get help right away if you are not doing well or get worse.  It may take approximately 1 month for you to get used to wearing her CPAP every night.  Be sure to work with your machine to get used to it, be patient, it may take time!  If you have trouble tolerating CPAP DO NOT RETURN YOUR MACHINE; Contact our office to see if we can help you tolerate the CPAP better first!

## 2018-03-12 NOTE — Progress Notes (Signed)
[facility] Consultation      Assessment and Plan:  Excessive daytime sleepiness. - Symptoms and signs of obstructive sleep apnea. - We will send for sleep study.  Essential hypertension, atrial fibrillation, obesity. - Obstructive sleep apnea can contribute to above conditions, therefore treatment of sleep apnea is an important part of their management.  Orders Placed This Encounter  Procedures  . Home sleep test   Return in about 3 months (around 06/11/2018).   Date: 03/12/2018  MRN# [id number] Joy Houston 14-Mar-1965  Referring Physician: Einar Pheasant, M.D.   Joy Houston is a 53 y.o. old female seen in consultation for chief complaint of:    Chief Complaint  Patient presents with  . Consult    Consult for snoring and day time fatigue.     HPI:  The patient is a 53 year old female referred for symptoms of excessive daytime sleepiness.  Her Epworth score is elevated at 15 today. Her husband is present and notes that she snores loudly, she falls asleep easily like in a movie theater. 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  This has been doing on for about 6 mo to a year and has been progressive worsening. She has gained about 40 lbs in past 3  Years.  She occasionally naps after getting home from work.   No previous testing for OSA. Her mother has OSA, not on cpap. Denies sleep paralysis, no sleep walking, no cataplexy. No jaw pain, no TMJ, no dentures.    PMHX:   Past Medical History:  Diagnosis Date  . Complication of anesthesia   . Eczema   . Family history of anesthesia complication    Mother - confusion  . Fatty liver   . Heart murmur    Slight - "nothing to worry about"  . Hemorrhoid   . Hypertension   . PONV (postoperative nausea and vomiting)    Surgical Hx:  Past Surgical History:  Procedure Laterality Date  . CESAREAN SECTION    . FINGER SURGERY     pin to 3rd finger Right hand  . LUMBAR LAMINECTOMY/DECOMPRESSION  MICRODISCECTOMY  12/15/2011   Procedure: LUMBAR LAMINECTOMY/DECOMPRESSION MICRODISCECTOMY 1 LEVEL;  Surgeon: Ophelia Charter, MD;  Location: [facility];  Service: Neurosurgery;  Laterality: Right;  RIGHT Lumbar four-Five diskectomy  . WISDOM TOOTH EXTRACTION     Family Hx:  Family History  Problem Relation Age of Onset  . GER disease Mother   . Diabetes Father   . Cancer Father        Melanoma, Prostate  . Heart disease Father   . Heart disease Maternal Grandfather   . Diabetes Paternal Grandfather   . Breast cancer Sister 39   Social Hx:   Social History   Tobacco Use  . Smoking status: Never Smoker  . Smokeless tobacco: Never Used  Substance Use Topics  . Alcohol use: Yes    Alcohol/week: 0.0 standard drinks    Comment: rare  . Drug use: No   Medication:    Current Outpatient Medications:  .  hydrochlorothiazide (MICROZIDE) 12.5 MG capsule, TAKE 1 TO 2 CAPSULES BY MOUTH EVERY DAY, Disp: 180 capsule, Rfl: 1 .  lisinopril (PRINIVIL,ZESTRIL) 10 MG tablet, Take 1 tablet (10 mg total) by mouth daily., Disp: 90 tablet, Rfl: 1 .  metoprolol succinate (TOPROL-XL) 25 MG 24 hr tablet, Take 25 mg by mouth daily., Disp: , Rfl:  .  ibuprofen (ADVIL,MOTRIN) 600 MG tablet, Take 1 tablet (600 mg  total) by mouth every 6 (six) hours as needed. (Patient not taking: Reported on 03/12/2018), Disp: 30 tablet, Rfl: 0   Allergies:  Other and Morphine and related  Review of Systems: Gen:  Denies  fever, sweats, chills HEENT: Denies blurred vision, double vision. bleeds, sore throat Cvc:  No dizziness, chest pain. Resp:   Denies cough or sputum production, shortness of breath Gi: Denies swallowing difficulty, stomach pain. Gu:  Denies bladder incontinence, burning urine Ext:   No Joint pain, stiffness. Skin: No skin rash,  hives  Endoc:  No polyuria, polydipsia. Psych: No depression, insomnia. Other:  All other systems were reviewed with the patient and were negative other that what is  mentioned in the HPI.   Physical Examination:   VS: BP 128/78   Pulse 72   Ht 5\' 5"  (1.651 m)   Wt 253 lb 6.4 oz (114.9 kg)   SpO2 99%   BMI 42.17 kg/m   General Appearance: No distress  Neuro:without focal findings,  speech normal,  HEENT: PERRLA, EOM intact.   Pulmonary: normal breath sounds, No wheezing.  CardiovascularNormal S1,S2.  No m/r/g.   Abdomen: Benign, Soft, non-tender. Renal:  No costovertebral tenderness  GU:  No performed at this time. Endoc: No evident thyromegaly, no signs of acromegaly. Skin:   warm, no rashes, no ecchymosis  Extremities: normal, no cyanosis, clubbing.  Other findings:    LABORATORY PANEL:   CBC No results for input(s): WBC, HGB, HCT, PLT in the last 168 hours. ------------------------------------------------------------------------------------------------------------------  Chemistries  No results for input(s): NA, K, CL, CO2, GLUCOSE, BUN, CREATININE, CALCIUM, MG, AST, ALT, ALKPHOS, BILITOT in the last 168 hours.  Invalid input(s): GFRCGP ------------------------------------------------------------------------------------------------------------------  Cardiac Enzymes No results for input(s): TROPONINI in the last 168 hours. ------------------------------------------------------------  RADIOLOGY:  No results found.     Thank  you for the consultation and for allowing [facility] to assist in the care of your patient. Our recommendations are noted above.  Please contact us if we can be of further service.   Marda Stalker, M.D., F.C.C.P.  Board Certified in Internal Medicine, Pulmonary Medicine, [place], and Sleep Medicine.   Pulmonary and Critical Care Office Number: [phone]   03/12/2018

## 2018-03-15 DIAGNOSIS — G4733 Obstructive sleep apnea (adult) (pediatric): Secondary | ICD-10-CM

## 2018-03-16 ENCOUNTER — Telehealth: Payer: Self-pay | Admitting: *Deleted

## 2018-03-16 DIAGNOSIS — G4733 Obstructive sleep apnea (adult) (pediatric): Secondary | ICD-10-CM

## 2018-03-16 NOTE — Telephone Encounter (Signed)
Pt aware of results. Orders placed. She wants Tappapp mask.

## 2018-03-26 ENCOUNTER — Encounter: Payer: Self-pay | Admitting: Dietician

## 2018-03-26 ENCOUNTER — Encounter: Payer: BLUE CROSS/BLUE SHIELD | Attending: Internal Medicine | Admitting: Dietician

## 2018-03-26 VITALS — Ht 65.5 in | Wt 250.1 lb

## 2018-03-26 DIAGNOSIS — Z6841 Body Mass Index (BMI) 40.0 and over, adult: Secondary | ICD-10-CM | POA: Diagnosis not present

## 2018-03-26 DIAGNOSIS — E119 Type 2 diabetes mellitus without complications: Secondary | ICD-10-CM

## 2018-03-26 NOTE — Progress Notes (Signed)
Medical Nutrition Therapy: Visit start time: 0910 end time: 1015  Assessment:  Diagnosis: Type 2 diabetes Past medical history: sleep apnea with A-fib Psychosocial issues/ stress concerns: none  Preferred learning method:  . Auditory . Visual . Hands-on   Current weight: 250.1lbs with shoes  Height: 5'5.5" Medications, supplements: reconciled list in medical record.  Progress and evaluation: Patient reports weight loss of about 13lbs in the past several weeks; has been working on [employer], which (per patient) incorporates clean eating and avoidance of sugar and wheat, bit includes other ancient grains, mostly organic. Lowered cholesterol and HbA1C in 2016 by following the same diet. Getting C-pap machine this week for sleep apnea. She would like help with achieving a balanced diet to control blood sugars while allowing for steady weight loss and incorporating some principles of the Fast Metabolism diet. She is familiar with the dietary exchange system and would like to have a meal plan using exchanges.    Physical activity: walking short duration, sporadically and working to gradually increase.   Dietary Intake:  Usual eating pattern includes 3 meals and 1 snacks per day. Dining out frequency: 2-5 meals per week.  Breakfast: [employer] K[employer] bread, 1-2 slices + protein -- grain + fruit Mon-Tues; protein Wed-Thu; protein + fat + fruit Fri-Sun Snack: frozen fruit w/ pressed fruit vinegar + water; or protein + bread Lunch: varies, black beans + salsa fajitas no rice, tortillas -- 1 grain + protein + veg + fruit except no grain Wed-Thu Snack: 1 grain and/or 1 fruit Supper: 1 grain + 1 protein + veg + fruit except no grain Wed-Thu Snack: none Beverages: water, other sugar free drinks  Nutrition Care Education: Topics covered: weight control, diabetes Basic nutrition: basic food groups, appropriate nutrient balance, appropriate meal and snack schedule, general nutrition guidelines     Weight control: benefits of weight control, appropriate energy intake for gradual and sustainable weight loss, role of physical activity Diabetes:  appropriate meal and snack schedule, appropriate carb intake and balance, healthy portions for carbohydrates and other food groups; 1600kcal meal plan with 40-45% CHO, 25-30% protein, and 30% fat; used exchange lists and meal plan; effect of weight loss on BGs  Nutritional Diagnosis:  [place]-2.2 Altered nutrition-related laboratory As related to Type 2 diabetes.  As evidenced by patient with recent HbA1C of 6.5%. [place]-3.3 Overweight/obesity As related to history of excess calories and inactivity.  As evidenced by patient with current BMI of 40.99, working on dietary changes for weight loss.  Intervention:   Instruction as noted above.  Commended patient for positive changes and progress already made.  Established goals with direction from patient.   No follow-up scheduled at this time; patient will schedule later if needed.   Education Materials given:  . Plate Planner with food lists . Dietary Exchanges for Weight Management (AND) . Sample meal pattern/ menus . Goals/ instructions   Learner/ who was taught:  . Patient   Level of understanding: Marland Kitchen Verbalizes/ demonstrates competency   Demonstrated degree of understanding via:   Teach back Learning barriers: . None  Willingness to learn/ readiness for change: . Eager, change in progress  Monitoring and Evaluation:  Dietary intake, exercise, BG control, and body weight      follow up: prn

## 2018-03-26 NOTE — Patient Instructions (Signed)
   Continue making healthy food choices, great job!  Use meal plan to ensure calorie control to 1500-1700 calories

## 2018-03-27 ENCOUNTER — Other Ambulatory Visit: Payer: Self-pay

## 2018-03-27 DIAGNOSIS — G4733 Obstructive sleep apnea (adult) (pediatric): Secondary | ICD-10-CM | POA: Diagnosis not present

## 2018-03-27 DIAGNOSIS — G4719 Other hypersomnia: Secondary | ICD-10-CM

## 2018-04-14 ENCOUNTER — Other Ambulatory Visit: Payer: Self-pay

## 2018-04-14 ENCOUNTER — Emergency Department
Admission: EM | Admit: 2018-04-14 | Discharge: 2018-04-14 | Disposition: A | Discharge status: Home / Self Care | Payer: BLUE CROSS/BLUE SHIELD | Attending: Emergency Medicine | Admitting: Emergency Medicine

## 2018-04-14 DIAGNOSIS — Z79899 Other long term (current) drug therapy: Secondary | ICD-10-CM | POA: Diagnosis not present

## 2018-04-14 DIAGNOSIS — I10 Essential (primary) hypertension: Secondary | ICD-10-CM | POA: Diagnosis not present

## 2018-04-14 DIAGNOSIS — E119 Type 2 diabetes mellitus without complications: Secondary | ICD-10-CM | POA: Diagnosis not present

## 2018-04-14 DIAGNOSIS — T783XXA Angioneurotic edema, initial encounter: Secondary | ICD-10-CM | POA: Diagnosis not present

## 2018-04-14 MED ORDER — METOPROLOL TARTRATE 5 MG/5ML IV SOLN
5.0000 mg | Freq: Once | INTRAVENOUS | Status: AC
  Administered 2018-04-14: 5 mg via INTRAVENOUS

## 2018-04-14 MED ORDER — METOPROLOL TARTRATE 5 MG/5ML IV SOLN
INTRAVENOUS | Status: AC
  Administered 2018-04-14: 5 mg via INTRAVENOUS
  Filled 2018-04-14: qty 5

## 2018-04-14 MED ORDER — AMLODIPINE BESYLATE 5 MG PO TABS: 5.0000 mg | ORAL_TABLET | Freq: Every day | ORAL | 0 refills | Status: DC

## 2018-04-14 MED ORDER — BUTALBITAL-APAP-CAFFEINE 50-325-40 MG PO TABS: 1.0000 | ORAL_TABLET | Freq: Four times a day (QID) | ORAL | 0 refills | Status: DC | PRN

## 2018-04-14 MED ORDER — ONDANSETRON HCL 4 MG/2ML IJ SOLN
4.0000 mg | Freq: Once | INTRAMUSCULAR | Status: AC
  Administered 2018-04-14: 4 mg via INTRAVENOUS

## 2018-04-14 MED ORDER — DEXAMETHASONE SODIUM PHOSPHATE 10 MG/ML IJ SOLN
20.0000 mg | Freq: Once | INTRAMUSCULAR | Status: AC
  Administered 2018-04-14: 20 mg via INTRAVENOUS

## 2018-04-14 MED ORDER — PREDNISONE 20 MG PO TABS: 40.0000 mg | ORAL_TABLET | Freq: Every day | ORAL | 0 refills | Status: AC

## 2018-04-14 MED ORDER — SODIUM CHLORIDE 0.9 % IV BOLUS
1000.0000 mL | Freq: Once | INTRAVENOUS | Status: AC
  Administered 2018-04-14: 1000 mL via INTRAVENOUS

## 2018-04-14 MED ORDER — ONDANSETRON HCL 4 MG/2ML IJ SOLN
INTRAMUSCULAR | Status: AC
  Administered 2018-04-14: 4 mg via INTRAVENOUS
  Filled 2018-04-14: qty 2

## 2018-04-14 NOTE — ED Provider Notes (Signed)
[facility] Emergency Department Provider Note  ____________________________________________   First MD Initiated Contact with Patient 04/14/18 [phone]     (approximate)  I have reviewed the triage vital signs and the nursing notes.   HISTORY  Chief Complaint Angioedema    HPI Joy Houston is a 53 y.o. female with below list of chronic medical conditions including hypertension for which the patient takes lisinopril presents to the emergency department with acute onset of left side tongue and lip swelling which began this morning.  Patient also admits to left foot swelling as well.  Patient does admit to difficulty breathing and swallowing as well.   Past Medical History:  Diagnosis Date  . Complication of anesthesia   . Eczema   . Family history of anesthesia complication    Mother - confusion  . Fatty liver   . Heart murmur    Slight - "nothing to worry about"  . Hemorrhoid   . Hypertension   . PONV (postoperative nausea and vomiting)     Patient Active Problem List   Diagnosis Date Noted  . A-fib ([place]) 03/08/2018  . Snoring 03/08/2018  . Pedal edema 03/08/2018  . Tachycardia 01/14/2018  . Chest pain 11/19/2017  . Right knee pain 03/05/2017  . Abdominal fullness in right upper quadrant 01/13/2015  . Sebaceous cyst 09/13/2014  . Change in vision 05/06/2014  . Health care maintenance 05/06/2014  . BMI 40.0-44.9, adult ([place]) 09/05/2013  . Abnormal liver function 01/08/2012  . Hypercholesterolemia 01/08/2012  . Hematuria 01/08/2012  . Diabetes mellitus without complication ([place]) 00/37/0488  . Hypertension 01/04/2012  . Lumbar herniated disc 12/15/2011    Past Surgical History:  Procedure Laterality Date  . CESAREAN SECTION    . FINGER SURGERY     pin to 3rd finger Right hand  . LUMBAR LAMINECTOMY/DECOMPRESSION MICRODISCECTOMY  12/15/2011   Procedure: LUMBAR LAMINECTOMY/DECOMPRESSION MICRODISCECTOMY 1 LEVEL;  Surgeon: Ophelia Charter, MD;  Location: [facility];  Service: Neurosurgery;  Laterality: Right;  RIGHT Lumbar four-Five diskectomy  . WISDOM TOOTH EXTRACTION      Prior to Admission medications   Medication Sig Start Date End Date Taking? Authorizing Provider  hydrochlorothiazide (MICROZIDE) 12.5 MG capsule TAKE 1 TO 2 CAPSULES BY MOUTH EVERY DAY 10/16/17  Yes Einar Pheasant, MD  ibuprofen (ADVIL,MOTRIN) 600 MG tablet Take 1 tablet (600 mg total) by mouth every 6 (six) hours as needed. 09/10/17  Yes Laban Emperor, PA-C  metoprolol succinate (TOPROL-XL) 25 MG 24 hr tablet Take 25 mg by mouth daily.   Yes [provider]    Allergies Lisinopril; Other; and Morphine and related  Family History  Problem Relation Age of Onset  . GER disease Mother   . Diabetes Father   . Cancer Father        Melanoma, Prostate  . Heart disease Father   . Heart disease Maternal Grandfather   . Diabetes Paternal Grandfather   . Breast cancer Sister 61    Social History Social History   Tobacco Use  . Smoking status: Never Smoker  . Smokeless tobacco: Never Used  Substance Use Topics  . Alcohol use: Yes    Alcohol/week: 0.0 standard drinks    Comment: rare  . Drug use: No    Review of Systems Constitutional: No fever/chills Eyes: No visual changes. ENT: No sore throat.  Positive for left lip and tongue swelling Cardiovascular: Denies chest pain. Respiratory: Denies shortness of breath. Gastrointestinal: No abdominal pain.  No nausea, no vomiting.  No diarrhea.  No constipation. Genitourinary: Negative for dysuria. Musculoskeletal: Negative for neck pain.  Negative for back pain. Integumentary: Negative for rash. Neurological: Negative for headaches, focal weakness or numbness.   ____________________________________________   PHYSICAL EXAM:  VITAL SIGNS: ED Triage Vitals [04/14/18 0429]  Enc Vitals Group     BP (!) 153/87     Pulse Rate 93     Resp 18     Temp      Temp Source Oral       SpO2 100 %     Weight 120.2 kg (265 lb)     Height      Head Circumference      Peak Flow      Pain Score 0     Pain Loc      Pain Edu?      Excl. in [place]?     Constitutional: Alert and oriented. Well appearing and in no acute distress. Eyes: Conjunctivae are normal.  Head: Atraumatic. Mouth/Throat: Mucous membranes are moist.  Oropharynx non-erythematous.  Left side tongue swelling with associated left lip swelling.  Mallampati class III visualization Neck: No stridor.   Cardiovascular: Tachycardia irregular rhythm. Good peripheral circulation. Grossly normal heart sounds. Respiratory: Normal respiratory effort.  No retractions. Lungs CTAB. Gastrointestinal: Soft and nontender. No distention.  Musculoskeletal: No lower extremity tenderness nor edema. No gross deformities of extremities. Neurologic:  Normal speech and language. No gross focal neurologic deficits are appreciated.  Skin:  Skin is warm, dry and intact. No rash noted. Psychiatric: Mood and affect are normal. Speech and behavior are normal.   Procedures   ____________________________________________   INITIAL IMPRESSION / ASSESSMENT AND PLAN / ED COURSE  As part of my medical decision making, I reviewed the following data within the electronic MEDICAL RECORD NUMBER   53 year old female presented with above-stated history and physical exam consistent with angioedema most likely secondary to lisinopril.  Patient given Decadron 20 mg after my evaluation.  On reevaluation patient states that difficulty breathing and swallowing have resolved and that she feels decrease swelling in her tongue and lip as well. ____________________________________________  FINAL CLINICAL IMPRESSION(S) / ED DIAGNOSES  Final diagnoses:  Angioedema, initial encounter     MEDICATIONS GIVEN DURING THIS VISIT:  Medications  dexamethasone (DECADRON) injection 20 mg (20 mg Intravenous Given 04/14/18 0434)  sodium chloride 0.9 % bolus 1,000  mL (1,000 mLs Intravenous New Bag/Given 04/14/18 0435)  ondansetron (ZOFRAN) injection 4 mg (4 mg Intravenous Given 04/14/18 0434)  metoprolol tartrate (LOPRESSOR) injection 5 mg (5 mg Intravenous Given 04/14/18 0453)     ED Discharge Orders    None       Note:  This document was prepared using Dragon voice recognition software and may include unintentional dictation errors.   Gregor Hams, MD 04/14/18 [phone]

## 2018-04-14 NOTE — ED Notes (Signed)
Pt resting quietly on ER stretcher, NAD noted, VSS, POC to monitor pt for 1-2 hours and d/c with new meds.  Pt aware of same.  No needs or c/o voiced at this time.  Will continue to monitor.

## 2018-04-14 NOTE — ED Provider Notes (Signed)
Patient received in sign-out from Dr. Owens Shark.  Workup and evaluation pending further observation and reassessment.  Patient reassessed.  Angioedema has completely resolved.  She is currently asymptomatic and requesting discharge home which I think is reasonable.  Discussed signs and symptoms which she should return to the ER and reemphasized importance of discontinuing any ACE inhibitor use.Merlyn Lot, MD 04/14/18 [phone]

## 2018-04-14 NOTE — ED Triage Notes (Signed)
Pt presents with left sided tongue and lip swelling. Pt is on lisinopril. md at bedside.

## 2018-04-16 ENCOUNTER — Encounter: Payer: Self-pay | Admitting: Internal Medicine

## 2018-04-16 DIAGNOSIS — G4733 Obstructive sleep apnea (adult) (pediatric): Secondary | ICD-10-CM | POA: Diagnosis not present

## 2018-04-16 DIAGNOSIS — E782 Mixed hyperlipidemia: Secondary | ICD-10-CM | POA: Diagnosis not present

## 2018-04-16 DIAGNOSIS — I480 Paroxysmal atrial fibrillation: Secondary | ICD-10-CM | POA: Diagnosis not present

## 2018-04-16 DIAGNOSIS — I10 Essential (primary) hypertension: Secondary | ICD-10-CM | POA: Diagnosis not present

## 2018-04-16 NOTE — Telephone Encounter (Signed)
Patient was seen in ED on 04/14/2018. She does not have a f/u scheduled with you. She sees Dr. Nehemiah Massed today. How soon would you like her to f/u with you?

## 2018-04-18 ENCOUNTER — Telehealth: Payer: Self-pay | Admitting: Internal Medicine

## 2018-04-18 ENCOUNTER — Other Ambulatory Visit: Payer: Self-pay | Admitting: Internal Medicine

## 2018-04-18 DIAGNOSIS — G4733 Obstructive sleep apnea (adult) (pediatric): Secondary | ICD-10-CM

## 2018-04-18 NOTE — Telephone Encounter (Signed)
Butch Penny, RT with [employer] called stating that patient feels that her pressure is to high.  Pt has changed mask x 2.  Butch Penny has faxed over download and is in your folder.  Pt requesting to have pressure lowered. Please advise. Rhonda J Cobb

## 2018-04-19 NOTE — Telephone Encounter (Signed)
Please change pressure to 5-8 cm H2O.

## 2018-04-19 NOTE — Telephone Encounter (Signed)
Download reprinted and placed in folder. Joy Houston

## 2018-04-19 NOTE — Telephone Encounter (Signed)
Pressure change entered. Left message for patient making aware pressures changed.

## 2018-04-19 NOTE — Telephone Encounter (Signed)
Certain parts of the report were no legible. Please reprint.

## 2018-04-23 ENCOUNTER — Telehealth: Payer: Self-pay | Admitting: Internal Medicine

## 2018-04-23 NOTE — Telephone Encounter (Signed)
Spoke to patient, let her know that the order has been put in to [employer] and to allow 5 days for the pressure to change. Patient stated that it is waking her up at night and she hasn't been able to wear it more than 4 hours at a time. She is going to contact Butch Penny at [employer] and touch base. Nothing further needed at this time.

## 2018-04-25 DIAGNOSIS — G4733 Obstructive sleep apnea (adult) (pediatric): Secondary | ICD-10-CM | POA: Diagnosis not present

## 2018-05-06 ENCOUNTER — Other Ambulatory Visit: Payer: Self-pay

## 2018-05-06 ENCOUNTER — Emergency Department
Admission: EM | Admit: 2018-05-06 | Discharge: 2018-05-07 | Disposition: A | Discharge status: Home / Self Care | Payer: BLUE CROSS/BLUE SHIELD | Attending: Emergency Medicine | Admitting: Emergency Medicine

## 2018-05-06 DIAGNOSIS — R600 Localized edema: Secondary | ICD-10-CM | POA: Diagnosis present

## 2018-05-06 DIAGNOSIS — E119 Type 2 diabetes mellitus without complications: Secondary | ICD-10-CM | POA: Insufficient documentation

## 2018-05-06 DIAGNOSIS — T7840XA Allergy, unspecified, initial encounter: Secondary | ICD-10-CM | POA: Diagnosis not present

## 2018-05-06 DIAGNOSIS — Z79899 Other long term (current) drug therapy: Secondary | ICD-10-CM | POA: Insufficient documentation

## 2018-05-06 DIAGNOSIS — I10 Essential (primary) hypertension: Secondary | ICD-10-CM | POA: Insufficient documentation

## 2018-05-06 MED ORDER — DIPHENHYDRAMINE HCL 50 MG/ML IJ SOLN
25.0000 mg | Freq: Once | INTRAMUSCULAR | Status: AC
  Administered 2018-05-06: 25 mg via INTRAVENOUS

## 2018-05-06 MED ORDER — PREDNISONE 10 MG PO TABS: 20.0000 mg | ORAL_TABLET | Freq: Every day | ORAL | 0 refills | Status: DC

## 2018-05-06 MED ORDER — DIPHENHYDRAMINE HCL 50 MG/ML IJ SOLN
INTRAMUSCULAR | Status: AC
  Administered 2018-05-06: 25 mg via INTRAVENOUS
  Filled 2018-05-06: qty 1

## 2018-05-06 MED ORDER — PREDNISONE 20 MG PO TABS
20.0000 mg | ORAL_TABLET | Freq: Once | ORAL | Status: AC
  Administered 2018-05-07: 20 mg via ORAL
  Filled 2018-05-06: qty 1

## 2018-05-06 NOTE — ED Provider Notes (Signed)
[facility] Emergency Department Provider Note   ____________________________________________    I have reviewed the triage vital signs and the nursing notes.   HISTORY  Chief Complaint Oral Swelling     HPI Joy Houston is a 53 y.o. female who presents with complaints of a strange sensation in her throat.  Patient was in the emergency department 3 weeks ago with mild angioedema attributed to lisinopril.  She was treated with Decadron in the emergency department and responded quite rapidly with improvement.  She has not been taking lisinopril since then.  She reports she was eating yogurt today and developed a strange sensation in her throat.  She denies any difficulty breathing.  Currently she feels like her symptoms are much better but when she drinks some fluid she still feels that her throat feels "funny ".  No rash.  No history of anaphylaxis  Past Medical History:  Diagnosis Date  . Complication of anesthesia   . Eczema   . Family history of anesthesia complication    Mother - confusion  . Fatty liver   . Heart murmur    Slight - "nothing to worry about"  . Hemorrhoid   . Hypertension   . PONV (postoperative nausea and vomiting)     Patient Active Problem List   Diagnosis Date Noted  . A-fib ([place]) 03/08/2018  . Snoring 03/08/2018  . Pedal edema 03/08/2018  . Tachycardia 01/14/2018  . Chest pain 11/19/2017  . Right knee pain 03/05/2017  . Abdominal fullness in right upper quadrant 01/13/2015  . Sebaceous cyst 09/13/2014  . Change in vision 05/06/2014  . Health care maintenance 05/06/2014  . BMI 40.0-44.9, adult ([place]) 09/05/2013  . Abnormal liver function 01/08/2012  . Hypercholesterolemia 01/08/2012  . Hematuria 01/08/2012  . Diabetes mellitus without complication ([place]) 50/93/2671  . Hypertension 01/04/2012  . Lumbar herniated disc 12/15/2011    Past Surgical History:  Procedure Laterality Date  . CESAREAN SECTION    .  FINGER SURGERY     pin to 3rd finger Right hand  . LUMBAR LAMINECTOMY/DECOMPRESSION MICRODISCECTOMY  12/15/2011   Procedure: LUMBAR LAMINECTOMY/DECOMPRESSION MICRODISCECTOMY 1 LEVEL;  Surgeon: Ophelia Charter, MD;  Location: [facility];  Service: Neurosurgery;  Laterality: Right;  RIGHT Lumbar four-Five diskectomy  . WISDOM TOOTH EXTRACTION      Prior to Admission medications   Medication Sig Start Date End Date Taking? Authorizing Provider  amLODipine (NORVASC) 5 MG tablet Take 1 tablet (5 mg total) by mouth daily. 04/14/18 04/14/19  Merlyn Lot, MD  butalbital-acetaminophen-caffeine (FIORICET, ESGIC) [phone] MG tablet Take 1-2 tablets by mouth every 6 (six) hours as needed for headache. 04/14/18 04/14/19  Merlyn Lot, MD  hydrochlorothiazide (MICROZIDE) 12.5 MG capsule TAKE 1 TO 2 CAPSULES BY MOUTH EVERY DAY 04/18/18   Einar Pheasant, MD  ibuprofen (ADVIL,MOTRIN) 600 MG tablet Take 1 tablet (600 mg total) by mouth every 6 (six) hours as needed. 09/10/17   Laban Emperor, PA-C  metoprolol succinate (TOPROL-XL) 25 MG 24 hr tablet Take 25 mg by mouth daily.    [provider]  predniSONE (DELTASONE) 10 MG tablet Take 2 tablets (20 mg total) by mouth daily. 05/06/18   Lavonia Drafts, MD     Allergies Lisinopril; Other; and Morphine and related  Family History  Problem Relation Age of Onset  . GER disease Mother   . Diabetes Father   . Cancer Father        Melanoma, Prostate  .  Heart disease Father   . Heart disease Maternal Grandfather   . Diabetes Paternal Grandfather   . Breast cancer Sister 73    Social History Social History   Tobacco Use  . Smoking status: Never Smoker  . Smokeless tobacco: Never Used  Substance Use Topics  . Alcohol use: Yes    Alcohol/week: 0.0 standard drinks    Comment: rare  . Drug use: No    Review of Systems  Constitutional: No fever/chills Eyes: No visual changes.  ENT: As above Cardiovascular: Denies chest pain.  Respiratory: Denies shortness of breath. Gastrointestinal: No abdominal pain.  No nausea, no vomiting.   Genitourinary: Negative for dysuria. Musculoskeletal: Negative for back pain. Skin: Negative for rash. Neurological: Negative for headaches   ____________________________________________   PHYSICAL EXAM:  VITAL SIGNS: ED Triage Vitals [05/06/18 2159]  Enc Vitals Group     BP (!) 167/78     Pulse Rate 94     Resp 18     Temp 98.4 F (36.9 C)     Temp Source Oral     SpO2 97 %     Weight      Height      Head Circumference      Peak Flow      Pain Score      Pain Loc      Pain Edu?      Excl. in [place]?     Constitutional: Alert and oriented. No acute distress. Pleasant and interactive Eyes: Conjunctivae are normal.  Head: Atraumatic. Nose: No congestion/rhinnorhea. Mouth/Throat: Mucous membranes are moist.  Pharynx normal, no swelling, no swelling of the floor of the mouth, no lip swelling Neck:  Painless ROM Cardiovascular: Normal rate, regular rhythm.  Good peripheral circulation. Respiratory: Normal respiratory effort.  No retractions. Lungs CTAB. Gastrointestinal: Soft and nontender. No distention.    Musculoskeletal: Warm and well perfused Neurologic:  Normal speech and language. No gross focal neurologic deficits are appreciated.  Skin:  Skin is warm, dry and intact. No rash noted. Psychiatric: Mood and affect are normal. Speech and behavior are normal.  ____________________________________________   LABS (all labs ordered are listed, but only abnormal results are displayed)  Labs Reviewed - No data to display ____________________________________________  EKG  None ____________________________________________  RADIOLOGY  None ____________________________________________   PROCEDURES  Procedure(s) performed: No  Procedures   Critical Care performed: No ____________________________________________   INITIAL IMPRESSION / ASSESSMENT AND  PLAN / ED COURSE  Pertinent labs & imaging results that were available during my care of the patient were reviewed by me and considered in my medical decision making (see chart for details).  Patient is feeling well currently.  Differential includes angioedema, anaphylaxis, globus hystericus.  She does report her symptoms have mostly improved  We will give IV Benadryl and reevaluate,   ----------------------------------------- 11:41 PM on 05/06/2018 -----------------------------------------  Patient reports feeling much better, she is completely asymptomatic at this time, she is anxious to go home.  I suspect this may be related to an allergic reaction have suggested follow-up with allergist for further testing.  Will give prednisone, recommend Benadryl as needed, strict return precautions    ____________________________________________   FINAL CLINICAL IMPRESSION(S) / ED DIAGNOSES  Final diagnoses:  Allergic reaction, initial encounter        Note:  This document was prepared using Dragon voice recognition software and may include unintentional dictation errors.   Lavonia Drafts, MD 05/06/18 2342

## 2018-05-06 NOTE — ED Notes (Signed)
Patient reports that throat feels some better.

## 2018-05-06 NOTE — ED Notes (Signed)
Dr Kinner at bedside. 

## 2018-05-06 NOTE — ED Triage Notes (Signed)
Patient reports seen couple weeks ago for angioedema related to lisinopril use.  Tonight while eating states feels like something is in her throat and that it is swelling.  Patient is able to speak in complete sentences without difficulty or distress.  No acute swelling noted to lips or tongue at this time.  Patient reports mouth feels really dry.

## 2018-05-07 ENCOUNTER — Encounter: Payer: Self-pay | Admitting: Internal Medicine

## 2018-05-07 NOTE — Telephone Encounter (Signed)
Please call and confirm pt doing ok now.  Agree with f/u ENT.  Keep [name] posted.

## 2018-05-07 NOTE — Telephone Encounter (Signed)
Patient stated she is doing okay. Has an appt with ENT tomorrow. Has benadryl if needed. She is not going to take prednisone right now. She spoke with her cardiologist and let him know that she wanted to consult with ENT and see what they decide to do tomorrow since it can cause palpitations. Advised patient to keep [name] posted and let me know if she needs anything.

## 2018-05-08 DIAGNOSIS — J039 Acute tonsillitis, unspecified: Secondary | ICD-10-CM | POA: Diagnosis not present

## 2018-05-08 DIAGNOSIS — J301 Allergic rhinitis due to pollen: Secondary | ICD-10-CM | POA: Diagnosis not present

## 2018-05-08 DIAGNOSIS — J305 Allergic rhinitis due to food: Secondary | ICD-10-CM | POA: Diagnosis not present

## 2018-05-08 DIAGNOSIS — T783XXA Angioneurotic edema, initial encounter: Secondary | ICD-10-CM | POA: Diagnosis not present

## 2018-05-08 DIAGNOSIS — R221 Localized swelling, mass and lump, neck: Secondary | ICD-10-CM | POA: Diagnosis not present

## 2018-05-09 ENCOUNTER — Encounter: Payer: Self-pay | Admitting: Internal Medicine

## 2018-05-10 ENCOUNTER — Emergency Department: Payer: BLUE CROSS/BLUE SHIELD

## 2018-05-10 ENCOUNTER — Other Ambulatory Visit: Payer: Self-pay

## 2018-05-10 ENCOUNTER — Telehealth: Payer: Self-pay

## 2018-05-10 ENCOUNTER — Observation Stay
Admission: EM | Admit: 2018-05-10 | Discharge: 2018-05-10 | Disposition: A | Discharge status: Home / Self Care | Payer: BLUE CROSS/BLUE SHIELD | Attending: Internal Medicine | Admitting: Internal Medicine

## 2018-05-10 ENCOUNTER — Ambulatory Visit: Payer: BLUE CROSS/BLUE SHIELD | Admitting: Internal Medicine

## 2018-05-10 ENCOUNTER — Encounter: Payer: Self-pay | Admitting: Pulmonary Disease

## 2018-05-10 DIAGNOSIS — I10 Essential (primary) hypertension: Secondary | ICD-10-CM | POA: Insufficient documentation

## 2018-05-10 DIAGNOSIS — E7800 Pure hypercholesterolemia, unspecified: Secondary | ICD-10-CM | POA: Insufficient documentation

## 2018-05-10 DIAGNOSIS — K760 Fatty (change of) liver, not elsewhere classified: Secondary | ICD-10-CM | POA: Diagnosis not present

## 2018-05-10 DIAGNOSIS — G4733 Obstructive sleep apnea (adult) (pediatric): Secondary | ICD-10-CM | POA: Insufficient documentation

## 2018-05-10 DIAGNOSIS — E119 Type 2 diabetes mellitus without complications: Secondary | ICD-10-CM | POA: Diagnosis not present

## 2018-05-10 DIAGNOSIS — T783XXA Angioneurotic edema, initial encounter: Secondary | ICD-10-CM | POA: Insufficient documentation

## 2018-05-10 DIAGNOSIS — I480 Paroxysmal atrial fibrillation: Secondary | ICD-10-CM | POA: Diagnosis not present

## 2018-05-10 DIAGNOSIS — R55 Syncope and collapse: Secondary | ICD-10-CM | POA: Diagnosis not present

## 2018-05-10 DIAGNOSIS — Z79899 Other long term (current) drug therapy: Secondary | ICD-10-CM | POA: Diagnosis not present

## 2018-05-10 DIAGNOSIS — Z7952 Long term (current) use of systemic steroids: Secondary | ICD-10-CM | POA: Diagnosis not present

## 2018-05-10 DIAGNOSIS — Z8249 Family history of ischemic heart disease and other diseases of the circulatory system: Secondary | ICD-10-CM | POA: Diagnosis not present

## 2018-05-10 DIAGNOSIS — Z6841 Body Mass Index (BMI) 40.0 and over, adult: Secondary | ICD-10-CM | POA: Diagnosis not present

## 2018-05-10 DIAGNOSIS — Z888 Allergy status to other drugs, medicaments and biological substances status: Secondary | ICD-10-CM | POA: Insufficient documentation

## 2018-05-10 DIAGNOSIS — R079 Chest pain, unspecified: Secondary | ICD-10-CM | POA: Diagnosis not present

## 2018-05-10 DIAGNOSIS — L309 Dermatitis, unspecified: Secondary | ICD-10-CM | POA: Diagnosis not present

## 2018-05-10 DIAGNOSIS — R0602 Shortness of breath: Secondary | ICD-10-CM | POA: Diagnosis not present

## 2018-05-10 DIAGNOSIS — R0789 Other chest pain: Secondary | ICD-10-CM | POA: Diagnosis not present

## 2018-05-10 DIAGNOSIS — R410 Disorientation, unspecified: Secondary | ICD-10-CM | POA: Insufficient documentation

## 2018-05-10 DIAGNOSIS — I4891 Unspecified atrial fibrillation: Secondary | ICD-10-CM | POA: Diagnosis not present

## 2018-05-10 DIAGNOSIS — F419 Anxiety disorder, unspecified: Secondary | ICD-10-CM | POA: Diagnosis not present

## 2018-05-10 LAB — FIBRIN DERIVATIVES D-DIMER (ARMC ONLY): Fibrin derivatives D-dimer (ARMC): 397.45 ng/mL (FEU) (ref 0.00–499.00)

## 2018-05-10 LAB — COOXEMETRY PANEL
Carboxyhemoglobin: 1.1 % (ref 0.5–1.5)
Methemoglobin: 1.3 % (ref 0.0–1.5)
O2 SAT: 98.9 %
Total oxygen content: 97.6 mL/dL

## 2018-05-10 LAB — BRAIN NATRIURETIC PEPTIDE: B NATRIURETIC PEPTIDE 5: 84 pg/mL (ref 0.0–100.0)

## 2018-05-10 LAB — T4, FREE: FREE T4: 0.82 ng/dL (ref 0.82–1.77)

## 2018-05-10 LAB — GLUCOSE, CAPILLARY: Glucose-Capillary: 115 mg/dL — ABNORMAL HIGH (ref 70–99)

## 2018-05-10 LAB — TROPONIN I: Troponin I: <0.03 ng/mL (ref <0.03)

## 2018-05-10 LAB — TSH: TSH: 2.011 u[IU]/mL (ref 0.350–4.500)

## 2018-05-10 LAB — LIPASE, BLOOD: Lipase: 22 U/L (ref 11–51)

## 2018-05-10 MED ORDER — SIMETHICONE 80 MG PO CHEW
80.0000 mg | CHEWABLE_TABLET | ORAL | Status: AC
  Administered 2018-05-10: 80 mg via ORAL
  Filled 2018-05-10: qty 1

## 2018-05-10 MED ORDER — METOPROLOL SUCCINATE ER 25 MG PO TB24
25.0000 mg | ORAL_TABLET | Freq: Every day | ORAL | Status: DC
  Administered 2018-05-10: 25 mg via ORAL
  Filled 2018-05-10: qty 1

## 2018-05-10 MED ORDER — ONDANSETRON HCL 4 MG/2ML IJ SOLN: 4.0000 mg | Freq: Four times a day (QID) | INTRAMUSCULAR | Status: DC | PRN

## 2018-05-10 MED ORDER — ONDANSETRON HCL 4 MG PO TABS: 4.0000 mg | ORAL_TABLET | Freq: Four times a day (QID) | ORAL | Status: DC | PRN

## 2018-05-10 MED ORDER — ACETAMINOPHEN 650 MG RE SUPP: 650.0000 mg | Freq: Four times a day (QID) | RECTAL | Status: DC | PRN

## 2018-05-10 MED ORDER — SODIUM CHLORIDE 0.9 % IV BOLUS
500.0000 mL | Freq: Once | INTRAVENOUS | Status: AC
  Administered 2018-05-10: 500 mL via INTRAVENOUS

## 2018-05-10 MED ORDER — ACETAMINOPHEN 325 MG PO TABS: 650.0000 mg | ORAL_TABLET | Freq: Four times a day (QID) | ORAL | Status: DC | PRN

## 2018-05-10 MED ORDER — ENOXAPARIN SODIUM 40 MG/0.4ML ~~LOC~~ SOLN: 40.0000 mg | Freq: Two times a day (BID) | SUBCUTANEOUS | Status: DC

## 2018-05-10 MED ORDER — LORAZEPAM 2 MG/ML IJ SOLN: 0.2500 mg | Freq: Once | INTRAMUSCULAR | Status: DC

## 2018-05-10 MED ORDER — SIMETHICONE 80 MG PO CHEW: 80.0000 mg | CHEWABLE_TABLET | Freq: Four times a day (QID) | ORAL | 0 refills | Status: DC | PRN

## 2018-05-10 MED ORDER — AMOXICILLIN-POT CLAVULANATE 875-125 MG PO TABS
1.0000 | ORAL_TABLET | Freq: Two times a day (BID) | ORAL | Status: DC
  Administered 2018-05-10: 1 via ORAL
  Filled 2018-05-10: qty 1

## 2018-05-10 MED ORDER — CULTURELLE DIGESTIVE HEALTH PO CAPS: 2.0000 | ORAL_CAPSULE | Freq: Every morning | ORAL | 0 refills | Status: DC

## 2018-05-10 NOTE — ED Triage Notes (Signed)
Pt in with co irregular HR hx of afib, states has been occurring for few months. Persistent symptoms tonight.

## 2018-05-10 NOTE — ED Notes (Signed)
ED TO INPATIENT HANDOFF REPORT  ED Nurse Name and Phone #: Jenny Reichmann [phone]   S Name/Age/Gender Joy Houston 53 y.o. female Room/Bed: ED15A/ED15A  Code Status   Code Status: Not on file  Home/SNF/Other Home Patient oriented to: self, place, time and situation Is this baseline? Yes   Triage Complete: Triage complete  Chief Complaint Afib  Triage Note Pt in with co irregular HR hx of afib, states has been occurring for few months. Persistent symptoms tonight.    Allergies Allergies  Allergen Reactions  . Angiotensin Receptor Blockers     Other reaction(s): Angioedema Had past reaction of arb and angioedema with ace  . Lisinopril Swelling  . Other   . Morphine And Related     Slight itching around nose area    Level of Care/Admitting Diagnosis ED Disposition    ED Disposition Condition [place]: [place] [100120]  Level of Care: Telemetry [5]  Diagnosis: SOB (shortness of breath) [578469]  Admitting Physician: Harrie Foreman [[id number]]  Attending Physician: Harrie Foreman [id number]  PT Class (Do Not Modify): Observation [104]  PT Acc Code (Do Not Modify): Observation [10022]       B Medical/Surgery History Past Medical History:  Diagnosis Date  . Complication of anesthesia   . Eczema   . Family history of anesthesia complication    Mother - confusion  . Fatty liver   . Heart murmur    Slight - "nothing to worry about"  . Hemorrhoid   . Hypertension   . PONV (postoperative nausea and vomiting)    Past Surgical History:  Procedure Laterality Date  . CESAREAN SECTION    . FINGER SURGERY     pin to 3rd finger Right hand  . LUMBAR LAMINECTOMY/DECOMPRESSION MICRODISCECTOMY  12/15/2011   Procedure: LUMBAR LAMINECTOMY/DECOMPRESSION MICRODISCECTOMY 1 LEVEL;  Surgeon: Ophelia Charter, MD;  Location: [facility];  Service: Neurosurgery;  Laterality: Right;  RIGHT Lumbar four-Five diskectomy  .  WISDOM TOOTH EXTRACTION       A IV Location/Drains/Wounds Patient Lines/Drains/Airways Status   Active Line/Drains/Airways    Name:   Placement date:   Placement time:   Site:   Days:   Peripheral IV 05/10/18 Right Antecubital   05/10/18    0400    Antecubital   less than 1          Intake/Output Last 24 hours No intake or output data in the 24 hours ending 05/10/18 0615  Labs/Imaging Results for orders placed or performed during the hospital encounter of 05/10/18 (from the past 48 hour(s))  CBC with Differential     Status: Abnormal   Collection Time: 05/10/18  2:46 AM  Result Value Ref Range   WBC 11.8 (H) 4.0 - 10.5 K/uL   RBC 5.19 (H) 3.87 - 5.11 MIL/uL   Hemoglobin 15.9 (H) 12.0 - 15.0 g/dL   HCT 47.1 (H) 36.0 - 46.0 %   MCV 90.8 80.0 - 100.0 fL   MCH 30.6 26.0 - 34.0 pg   MCHC 33.8 30.0 - 36.0 g/dL   RDW 12.9 11.5 - 15.5 %   Platelets 310 150 - 400 K/uL   nRBC 0.0 0.0 - 0.2 %   Neutrophils Relative % 85 %   Neutro Abs 10.0 (H) 1.7 - 7.7 K/uL   Lymphocytes Relative 12 %   Lymphs Abs 1.4 0.7 - 4.0 K/uL   Monocytes Relative 3 %   Monocytes Absolute 0.4  0.1 - 1.0 K/uL   Eosinophils Relative 0 %   Eosinophils Absolute 0.0 0.0 - 0.5 K/uL   Basophils Relative 0 %   Basophils Absolute 0.0 0.0 - 0.1 K/uL   Immature Granulocytes 0 %   Abs Immature Granulocytes 0.03 0.00 - 0.07 K/uL    Comment: Performed at [facility], [address] [place], [place] [address]  Comprehensive metabolic panel     Status: Abnormal   Collection Time: 05/10/18  2:46 AM  Result Value Ref Range   Sodium 139 135 - 145 mmol/L   Potassium 3.7 3.5 - 5.1 mmol/L   Chloride 105 98 - 111 mmol/L   CO2 22 22 - 32 mmol/L   Glucose, Bld 172 (H) 70 - 99 mg/dL   BUN 10 6 - 20 mg/dL   Creatinine, Ser 0.64 0.44 - 1.00 mg/dL   Calcium 9.1 8.9 - 10.3 mg/dL   Total Protein 7.7 6.5 - 8.1 g/dL   Albumin 4.2 3.5 - 5.0 g/dL   AST 35 15 - 41 U/L   ALT 45 (H) 0 - 44 U/L   Alkaline Phosphatase 65  38 - 126 U/L   Total Bilirubin 0.7 0.3 - 1.2 mg/dL   GFR calc non Af Amer >60 >60 mL/min   GFR calc Af Amer >60 >60 mL/min   Anion gap 12 5 - 15    Comment: Performed at [facility], [address], [place], [place] [address]  Brain natriuretic peptide     Status: None   Collection Time: 05/10/18  2:46 AM  Result Value Ref Range   B Natriuretic Peptide 84.0 0.0 - 100.0 pg/mL    Comment: Performed at [facility], [address] [place], [place] [address]  Troponin I - Once     Status: None   Collection Time: 05/10/18  2:46 AM  Result Value Ref Range   Troponin I <0.03 <0.03 ng/mL    Comment: Performed at [facility], [address] [place], [place] [address]  Lipase, blood     Status: None   Collection Time: 05/10/18  2:46 AM  Result Value Ref Range   Lipase 22 11 - 51 U/L    Comment: Performed at [facility], [address] [place], [place] [address]  Fibrin derivatives D-Dimer     Status: None   Collection Time: 05/10/18  2:46 AM  Result Value Ref Range   Fibrin derivatives D-dimer ([facility]) 397.45 0.00 - 499.00 ng/mL (FEU)    Comment: (NOTE) <> Exclusion of Venous Thromboembolism (VTE) - OUTPATIENT ONLY   (Emergency Department or [place])   0-499 ng/ml (FEU): With a low to intermediate pretest probability                      for VTE this test result excludes the diagnosis                      of VTE.   >499 ng/ml (FEU) : VTE not excluded; additional work up for VTE is                      required. <> Testing on Inpatients and Evaluation of Disseminated Intravascular   Coagulation (DIC) Reference Range:   0-499 ng/ml (FEU) Performed at [facility], [address] [place], [place] [address]   CK     Status: None   Collection Time: 05/10/18  2:46 AM  Result Value Ref Range   Total CK 184 38 - 234 U/L    Comment: Performed at [facility], [address] [place], [place] [address]  TSH     Status: None    Collection Time: 05/10/18  2:46 AM  Result Value Ref Range   TSH 2.011 0.350 - 4.500 uIU/mL    Comment: Performed by a 3rd Generation assay with a functional sensitivity of <=0.01 uIU/mL. Performed at [facility], [address] [place], [place] [address]   T4, free     Status: None   Collection Time: 05/10/18  2:46 AM  Result Value Ref Range   Free T4 0.82 0.82 - 1.77 ng/dL    Comment: (NOTE) Biotin ingestion may interfere with free T4 tests. If the results are inconsistent with the TSH level, previous test results, or the clinical presentation, then consider biotin interference. If needed, order repeat testing after stopping biotin. Performed at [facility], [address] [place], [place] [address]   Cooxemetry Panel, hospital-performed (carboxy, met, total hgb, O2 sat)     Status: None   Collection Time: 05/10/18  3:46 AM  Result Value Ref Range   O2 Saturation 98.9 %   Carboxyhemoglobin 1.1 0.5 - 1.5 %    Comment: CRITICAL RESULT CALLED TO, READ BACK BY AND VERIFIED WITH: DR SUNG 05/10/18  0450 BC    Methemoglobin 1.3 0.0 - 1.5 %   Total oxygen content 97.6 mL/dL    Comment: Performed at [facility], [address] [place], [place] [address]  Blood gas, arterial     Status: Abnormal (Preliminary result)   Collection Time: 05/10/18  3:46 AM  Result Value Ref Range   FIO2 0.28    Delivery systems Jackpot 2 LPM    pH, Arterial 7.42 7.350 - 7.450   pCO2 arterial 35 32.0 - 48.0 mmHg   pO2, Arterial 127 (H) 83.0 - 108.0 mmHg   Bicarbonate 22.7 20.0 - 28.0 mmol/L   Acid-base deficit 1.3 0.0 - 2.0 mmol/L   O2 Saturation 98.9 %   Patient temperature 37.0    Collection site LEFT RADIAL    Sample type ARTERIAL DRAW    Allens test (pass/fail) PASS PASS    Comment: Performed at [facility], [address], [place], [place] [address]   Mechanical Rate PENDING    Dg Chest 2 View  Result Date: 05/10/2018 CLINICAL DATA:  Atrial  fibrillation and chest pain EXAM: CHEST - 2 VIEW COMPARISON:  01/15/2013 FINDINGS: Normal heart size and mediastinal contours. No acute infiltrate or edema. No effusion or pneumothorax. No acute osseous findings. IMPRESSION: Negative chest. Electronically Signed   By: Monte Fantasia M.D.   On: 05/10/2018 04:19   Ct Head Wo Contrast  Result Date: 05/10/2018 CLINICAL DATA:  Confusion.  Atrial fibrillation. EXAM: CT HEAD WITHOUT CONTRAST TECHNIQUE: Contiguous axial images were obtained from the base of the skull through the vertex without intravenous contrast. COMPARISON:  None. FINDINGS: Brain: No evidence of acute infarction, hemorrhage, hydrocephalus, extra-axial collection or mass lesion/mass effect. Vascular: No hyperdense vessel or unexpected calcification. Skull: Normal. Negative for fracture or focal lesion. Sinuses/Orbits: Negative IMPRESSION: Negative head CT. Electronically Signed   By: Monte Fantasia M.D.   On: 05/10/2018 04:20    Pending Labs Unresulted Labs (From admission, onward)    Start     Ordered   Signed and Held  Creatinine, serum  (enoxaparin (LOVENOX)    CrCl >/= 30 ml/min)  Weekly,   R    Comments:  while on enoxaparin therapy    Signed and Held          Vitals/Pain Today's Vitals   05/10/18 0530 05/10/18 0545 05/10/18 0600 05/10/18 0615  BP: (!) 152/83  135/81   Pulse: 91 84 87 85  Resp: 20 (!) 25 (!) 30   Temp:      TempSrc:      SpO2: 100% 98% 98% 98%  Weight:      Height:      PainSc:        Isolation Precautions No active isolations  Medications Medications  LORazepam (ATIVAN) injection 0.25 mg (has no administration in time range)  sodium chloride 0.9 % bolus 500 mL (500 mLs Intravenous New Bag/Given 05/10/18 0435)    Mobility walks Low fall risk   Focused Assessments Cardiac Assessment Handoff:  Cardiac Rhythm: Sinus tachycardia Lab Results  Component Value Date   CKTOTAL 184 05/10/2018   TROPONINI <0.03 05/10/2018   No results  found for: DDIMER Does the Patient currently have chest pain? No     R Recommendations: See Admitting Provider Note  Report given to:   Additional Notes: NONE

## 2018-05-10 NOTE — Progress Notes (Signed)
Joy Houston to be D/C'd Home per MD order.  Discussed prescriptions and follow up appointments with the patient. Prescriptions were e-prescribed medication list explained in detail. Pt verbalized understanding. Husband at bedside to pick up pt.   Allergies as of 05/10/2018      Reactions   Angiotensin Receptor Blockers    Other reaction(s): Angioedema Had past reaction of arb and angioedema with ace   Lisinopril Swelling   Other    Morphine And Related    Slight itching around nose area      Medication List    STOP taking these medications   amLODipine 5 MG tablet Commonly known as:  NORVASC   hydrochlorothiazide 12.5 MG capsule Commonly known as:  MICROZIDE     TAKE these medications   amoxicillin-clavulanate 875-125 MG tablet Commonly known as:  AUGMENTIN Take 1 tablet by mouth 2 (two) times daily.   Culturelle Digestive Health Caps Take 2 capsules by mouth every morning for 7 days. Best taken on an empty stomach with a glass of water.   EPINEPHrine 0.3 mg/0.3 mL Soaj injection Commonly known as:  EPI-PEN   metoprolol succinate 25 MG 24 hr tablet Commonly known as:  TOPROL-XL Take 25 mg by mouth daily.   predniSONE 10 MG tablet Commonly known as:  DELTASONE Take 2 tablets (20 mg total) by mouth daily.   simethicone 80 MG chewable tablet Commonly known as:  MYLICON Chew 1 tablet (80 mg total) by mouth every 6 (six) hours as needed for up to 30 days for flatulence.            Durable Medical Equipment  (From admission, onward)         Start     Ordered   05/10/18 1431  DME Oxygen  Once    Question:  Oxygen delivery system  Answer:  Gas   05/10/18 1437          Vitals:   05/10/18 0752 05/10/18 1402  BP: (!) 142/74 139/76  Pulse: 87 77  Resp: 20 20  Temp: 98.4 F (36.9 C)   SpO2: 98% 98%    Tele box removed. Skin clean, dry and intact without evidence of skin break down, no evidence of skin tears noted. IV catheter discontinued intact.  Site without signs and symptoms of complications. Dressing and pressure applied. Pt denies pain at this time. No complaints noted.  An After Visit Summary was printed and given to the patient. Patient escorted via [place], and D/C home via private auto.  Rolley Sims

## 2018-05-10 NOTE — Telephone Encounter (Signed)
Copied from [place] [id number]. Topic: General - Other >> May 10, 2018  3:28 PM Antonieta Iba C wrote: Reason for CRM: [place] called in to schedule pt an hospital follow up visit PCP. Pt is being discharged today, not showing any openings with PCP.   Please call pt directly and assist with scheduling directly .

## 2018-05-10 NOTE — H&P (Signed)
Joy Houston is an 53 y.o. female.   Chief Complaint: Shortness of breath HPI: The patient with past medical history of hypertension and paroxysmal atrial fibrillation presents to the emergency department complaining of shortness of breath.  Her dyspnea is always associated with A. fib and the patient is very aware when she goes into atrial fibrillation. This has happened multiple times over the last 3 weeks.  Coincidentally, the patient also had an episode of angioedema 3 weeks ago.  Since that time she has not had any more mucosal swelling but admits to some swelling of her feet bilaterally that starts off as itching.  The patient took 40 mg of prednisone which she had leftover from her prescription following the episode of angioedema, and it helped with her pedal swelling.  Currently she has no lower extremity edema, but tonight her shortness of breath has become distressing although she denies chest pain.  She denies cough but admits to a soreness in her throat that ENT visualized on laryngoscopy as a small pocket of infection that may be related to her CPAP.  Also of note, the patient has been unable to tolerate her CPAP as is not yet titrated appropriately.  For the above reasons the emergency department staff called the hospitalist service for further evaluation.  Past Medical History:  Diagnosis Date  . Complication of anesthesia   . Eczema   . Family history of anesthesia complication    Mother - confusion  . Fatty liver   . Heart murmur    Slight - "nothing to worry about"  . Hemorrhoid   . Hypertension   . PONV (postoperative nausea and vomiting)     Past Surgical History:  Procedure Laterality Date  . CESAREAN SECTION    . FINGER SURGERY     pin to 3rd finger Right hand  . LUMBAR LAMINECTOMY/DECOMPRESSION MICRODISCECTOMY  12/15/2011   Procedure: LUMBAR LAMINECTOMY/DECOMPRESSION MICRODISCECTOMY 1 LEVEL;  Surgeon: Ophelia Charter, MD;  Location: [facility];  Service:  Neurosurgery;  Laterality: Right;  RIGHT Lumbar four-Five diskectomy  . WISDOM TOOTH EXTRACTION      Family History  Problem Relation Age of Onset  . GER disease Mother   . Diabetes Father   . Cancer Father        Melanoma, Prostate  . Heart disease Father   . Heart disease Maternal Grandfather   . Diabetes Paternal Grandfather   . Breast cancer Sister 6   Social History:  reports that she has never smoked. She has never used smokeless tobacco. She reports current alcohol use. She reports that she does not use drugs.  Allergies:  Allergies  Allergen Reactions  . Angiotensin Receptor Blockers     Other reaction(s): Angioedema Had past reaction of arb and angioedema with ace  . Lisinopril Swelling  . Other   . Morphine And Related     Slight itching around nose area    Medications Prior to Admission  Medication Sig Dispense Refill  . amoxicillin-clavulanate (AUGMENTIN) 875-125 MG tablet Take 1 tablet by mouth 2 (two) times daily.     . metoprolol succinate (TOPROL-XL) 25 MG 24 hr tablet Take 25 mg by mouth daily.    . predniSONE (DELTASONE) 10 MG tablet Take 2 tablets (20 mg total) by mouth daily. 6 tablet 0  . amLODipine (NORVASC) 5 MG tablet Take 1 tablet (5 mg total) by mouth daily. (Patient not taking: Reported on 05/10/2018) 14 tablet 0  . EPINEPHrine 0.3 mg/0.3 mL  IJ SOAJ injection     . hydrochlorothiazide (MICROZIDE) 12.5 MG capsule TAKE 1 TO 2 CAPSULES BY MOUTH EVERY DAY (Patient not taking: Reported on 05/10/2018) 180 capsule 1    Results for orders placed or performed during the hospital encounter of 05/10/18 (from the past 48 hour(s))  CBC with Differential     Status: Abnormal   Collection Time: 05/10/18  2:46 AM  Result Value Ref Range   WBC 11.8 (H) 4.0 - 10.5 K/uL   RBC 5.19 (H) 3.87 - 5.11 MIL/uL   Hemoglobin 15.9 (H) 12.0 - 15.0 g/dL   HCT 47.1 (H) 36.0 - 46.0 %   MCV 90.8 80.0 - 100.0 fL   MCH 30.6 26.0 - 34.0 pg   MCHC 33.8 30.0 - 36.0 g/dL   RDW  12.9 11.5 - 15.5 %   Platelets 310 150 - 400 K/uL   nRBC 0.0 0.0 - 0.2 %   Neutrophils Relative % 85 %   Neutro Abs 10.0 (H) 1.7 - 7.7 K/uL   Lymphocytes Relative 12 %   Lymphs Abs 1.4 0.7 - 4.0 K/uL   Monocytes Relative 3 %   Monocytes Absolute 0.4 0.1 - 1.0 K/uL   Eosinophils Relative 0 %   Eosinophils Absolute 0.0 0.0 - 0.5 K/uL   Basophils Relative 0 %   Basophils Absolute 0.0 0.0 - 0.1 K/uL   Immature Granulocytes 0 %   Abs Immature Granulocytes 0.03 0.00 - 0.07 K/uL    Comment: Performed at [facility], [address] [place], [place] [address]  Comprehensive metabolic panel     Status: Abnormal   Collection Time: 05/10/18  2:46 AM  Result Value Ref Range   Sodium 139 135 - 145 mmol/L   Potassium 3.7 3.5 - 5.1 mmol/L   Chloride 105 98 - 111 mmol/L   CO2 22 22 - 32 mmol/L   Glucose, Bld 172 (H) 70 - 99 mg/dL   BUN 10 6 - 20 mg/dL   Creatinine, Ser 0.64 0.44 - 1.00 mg/dL   Calcium 9.1 8.9 - 10.3 mg/dL   Total Protein 7.7 6.5 - 8.1 g/dL   Albumin 4.2 3.5 - 5.0 g/dL   AST 35 15 - 41 U/L   ALT 45 (H) 0 - 44 U/L   Alkaline Phosphatase 65 38 - 126 U/L   Total Bilirubin 0.7 0.3 - 1.2 mg/dL   GFR calc non Af Amer >60 >60 mL/min   GFR calc Af Amer >60 >60 mL/min   Anion gap 12 5 - 15    Comment: Performed at [facility], [address] [place], [place] [address]  Brain natriuretic peptide     Status: None   Collection Time: 05/10/18  2:46 AM  Result Value Ref Range   B Natriuretic Peptide 84.0 0.0 - 100.0 pg/mL    Comment: Performed at [facility], [address] [place], [place] [address]  Troponin I - Once     Status: None   Collection Time: 05/10/18  2:46 AM  Result Value Ref Range   Troponin I <0.03 <0.03 ng/mL    Comment: Performed at [facility], [address] [place], [place] [address]  Lipase, blood     Status: None   Collection Time: 05/10/18  2:46 AM  Result Value Ref Range   Lipase 22 11 - 51 U/L    Comment:  Performed at [facility], [address] [place], [place] [address]  Fibrin derivatives D-Dimer  Status: None   Collection Time: 05/10/18  2:46 AM  Result Value Ref Range   Fibrin derivatives D-dimer ([facility]) 397.45 0.00 - 499.00 ng/mL (FEU)    Comment: (NOTE) <> Exclusion of Venous Thromboembolism (VTE) - OUTPATIENT ONLY   (Emergency Department or [place])   0-499 ng/ml (FEU): With a low to intermediate pretest probability                      for VTE this test result excludes the diagnosis                      of VTE.   >499 ng/ml (FEU) : VTE not excluded; additional work up for VTE is                      required. <> Testing on Inpatients and Evaluation of Disseminated Intravascular   Coagulation (DIC) Reference Range:   0-499 ng/ml (FEU) Performed at [facility], [address] [place], [place] [address]   CK     Status: None   Collection Time: 05/10/18  2:46 AM  Result Value Ref Range   Total CK 184 38 - 234 U/L    Comment: Performed at [facility], [address] [place], [place] [address]  TSH     Status: None   Collection Time: 05/10/18  2:46 AM  Result Value Ref Range   TSH 2.011 0.350 - 4.500 uIU/mL    Comment: Performed by a 3rd Generation assay with a functional sensitivity of <=0.01 uIU/mL. Performed at [facility], [address] [place], [place] [address]   T4, free     Status: None   Collection Time: 05/10/18  2:46 AM  Result Value Ref Range   Free T4 0.82 0.82 - 1.77 ng/dL    Comment: (NOTE) Biotin ingestion may interfere with free T4 tests. If the results are inconsistent with the TSH level, previous test results, or the clinical presentation, then consider biotin interference. If needed, order repeat testing after stopping biotin. Performed at [facility], [address] [place], [place] [address]   Cooxemetry Panel, hospital-performed (carboxy, met, total hgb, O2 sat)     Status: None    Collection Time: 05/10/18  3:46 AM  Result Value Ref Range   O2 Saturation 98.9 %   Carboxyhemoglobin 1.1 0.5 - 1.5 %    Comment: CRITICAL RESULT CALLED TO, READ BACK BY AND VERIFIED WITH: DR SUNG 05/10/18  0450 BC    Methemoglobin 1.3 0.0 - 1.5 %   Total oxygen content 97.6 mL/dL    Comment: Performed at [facility], [address] [place], [place] [address]  Blood gas, arterial     Status: Abnormal (Preliminary result)   Collection Time: 05/10/18  3:46 AM  Result Value Ref Range   FIO2 0.28    Delivery systems [place] 2 LPM    pH, Arterial 7.42 7.350 - 7.450   pCO2 arterial 35 32.0 - 48.0 mmHg   pO2, Arterial 127 (H) 83.0 - 108.0 mmHg   Bicarbonate 22.7 20.0 - 28.0 mmol/L   Acid-base deficit 1.3 0.0 - 2.0 mmol/L   O2 Saturation 98.9 %   Patient temperature 37.0    Collection site LEFT RADIAL    Sample type ARTERIAL DRAW    Allens test (pass/fail) PASS PASS    Comment: Performed at [facility], [address], [place], [place] [address]  Mechanical Rate PENDING    Dg Chest 2 View  Result Date: 05/10/2018 CLINICAL DATA:  Atrial fibrillation and chest pain EXAM: CHEST - 2 VIEW COMPARISON:  01/15/2013 FINDINGS: Normal heart size and mediastinal contours. No acute infiltrate or edema. No effusion or pneumothorax. No acute osseous findings. IMPRESSION: Negative chest. Electronically Signed   By: Monte Fantasia M.D.   On: 05/10/2018 04:19   Ct Head Wo Contrast  Result Date: 05/10/2018 CLINICAL DATA:  Confusion.  Atrial fibrillation. EXAM: CT HEAD WITHOUT CONTRAST TECHNIQUE: Contiguous axial images were obtained from the base of the skull through the vertex without intravenous contrast. COMPARISON:  None. FINDINGS: Brain: No evidence of acute infarction, hemorrhage, hydrocephalus, extra-axial collection or mass lesion/mass effect. Vascular: No hyperdense vessel or unexpected calcification. Skull: Normal. Negative for fracture or focal lesion. Sinuses/Orbits: Negative  IMPRESSION: Negative head CT. Electronically Signed   By: Monte Fantasia M.D.   On: 05/10/2018 04:20    Review of Systems  Constitutional: Negative for chills and fever.  HENT: Negative for sore throat and tinnitus.   Eyes: Negative for blurred vision and redness.  Respiratory: Positive for shortness of breath. Negative for cough.   Cardiovascular: Positive for palpitations. Negative for chest pain, orthopnea and PND.  Gastrointestinal: Negative for abdominal pain, diarrhea, nausea and vomiting.  Genitourinary: Negative for dysuria, frequency and urgency.  Musculoskeletal: Negative for joint pain and myalgias.  Skin: Positive for itching. Negative for rash.       No lesions  Neurological: Negative for speech change, focal weakness and weakness.  Endo/Heme/Allergies: Does not bruise/bleed easily.       No temperature intolerance  Psychiatric/Behavioral: Negative for depression and suicidal ideas.    Blood pressure (!) 156/84, pulse 92, temperature 98 F (36.7 C), temperature source Oral, resp. rate (!) 30, height 5' 5.5" (1.664 m), weight 111.5 kg, SpO2 99 %. Physical Exam  Vitals reviewed. Constitutional: She is oriented to person, place, and time. She appears well-developed and well-nourished. No distress.  HENT:  Head: Normocephalic and atraumatic.  Mouth/Throat: Oropharynx is clear and moist.  Eyes: Pupils are equal, round, and reactive to light. Conjunctivae and EOM are normal. No scleral icterus.  Neck: Normal range of motion. Neck supple. No JVD present. No tracheal deviation present. No thyromegaly present.  Cardiovascular: Normal rate, regular rhythm and normal heart sounds. Exam reveals no gallop and no friction rub.  No murmur heard. Respiratory: Effort normal and breath sounds normal.  GI: Soft. Bowel sounds are normal. She exhibits no distension. There is no abdominal tenderness.  Genitourinary:    Genitourinary Comments: Deferred   Musculoskeletal: Normal range of  motion.        General: No edema.  Lymphadenopathy:    She has no cervical adenopathy.  Neurological: She is alert and oriented to person, place, and time. No cranial nerve deficit. She exhibits normal muscle tone.  Skin: Skin is warm and dry. No rash noted. No erythema.  Psychiatric: She has a normal mood and affect. Her behavior is normal. Judgment and thought content normal.     Assessment/Plan This is a 53 year old female admitted for shortness of breath. 1.  Shortness of breath: Multifactorial.  The patient usually shortness of breath with atrial fibrillation, however differential diagnosis includes provoked bronchial spasm (the patient works near a boiler and has had episodes of shortness of breath as well as confusion and cognitive slowing that seem to resolve when not at work), transient CHF possibly due to A. fib with RVR  or discordant systolic function, or more persistent atrial fibrillation.  Consult cardiology as well as pulmonology 2.  Atrial fibrillation: Paroxysmal; continue metoprolol.  Consider anticoagulation if determined to be more persistent. 3.  Hypertension: Uncontrolled; reassess antihypertensives as the patient cannot take lisinopril.  (Consider resuming hydrochlorothiazide) 4.  OSA: Patient will need overnight monitored sleep study as opposed to outpatient sleep study to more accurately titrate CPAP pressure. 5.  DVT prophylaxis: Lovenox 6.  GI prophylaxis: None The patient is a full code.  Time spent on admission orders and patient care approximately 45 minutes  Harrie Foreman, MD 05/10/2018, 7:26 AM

## 2018-05-10 NOTE — Consult Note (Signed)
Pulmonary Medicine          Date: 05/10/2018,   MRN# [id number] Joy Houston 09-20-1965     AdmissionWeight: 113.4 kg                 CurrentWeight: 111.5 kg      CHIEF COMPLAINT:   Throat discomfort, shortness of breath at rest, palpitations, aerophagia   HISTORY OF PRESENT ILLNESS    This is a pleasant 53 year old female with history of eczema, gestational diabetes, atrial fibrillation, obesity, sleep apnea currently on CPAP however unclear severity.  Patient reports that she has been healthy majority of her life however has recently been diagnosed with atrial fibrillation and is evaluated by cardiology and currently medically managed.  She has recently developed angioedema which is thought to be due to ACE inhibitor use and has responded well with diphenhydramine.  She has also been evaluated by ENT who has noted a pocket of infection.  On my evaluation she does appear to have a left tonsillar exudate.  Patient has also been tested for sleep apnea with a H sat and reports an AHI of 180 indicative of extremely severe sleep apnea however reports only sleeping 1 hour that night.  She has been using a fullface mask as interface and reports severe aerophagia with resultant belching and flatus.  Additionally she does have some atopic history including seasonal allergies, eczema and possible reactive airway disease/asthma in the past but is unsure.  She did have an inhaler in the past when she developed pneumonia.  She also reports possible mold exposure at work where they had mold on the wall which has been repaired by washing as well as stagnant water in a boiler which has possibly contributed to her respiratory decline.  She has not had a pulmonary evaluation in the past.  Patient also describes some symptoms of pulmonary hypertension including presyncope dizziness lightheadedness however has not lost consciousness in the past.  On review of her echo her R VSP is within reference  range however on lateral chest x-ray the retrocardiac space is diminished.  She has not had a right heart cath in the past.  She denies constitutional symptoms denies nausea and vomiting however does report high frequency of belching and was doing so during my interview today.   PAST MEDICAL HISTORY   Past Medical History:  Diagnosis Date   Complication of anesthesia    Eczema    Family history of anesthesia complication    Mother - confusion   Fatty liver    Heart murmur    Slight - "nothing to worry about"   Hemorrhoid    Hypertension    PONV (postoperative nausea and vomiting)      SURGICAL HISTORY   Past Surgical History:  Procedure Laterality Date   CESAREAN SECTION     FINGER SURGERY     pin to 3rd finger Right hand   LUMBAR LAMINECTOMY/DECOMPRESSION MICRODISCECTOMY  12/15/2011   Procedure: LUMBAR LAMINECTOMY/DECOMPRESSION MICRODISCECTOMY 1 LEVEL;  Surgeon: Ophelia Charter, MD;  Location: [facility];  Service: Neurosurgery;  Laterality: Right;  RIGHT Lumbar four-Five diskectomy   WISDOM TOOTH EXTRACTION       FAMILY HISTORY   Family History  Problem Relation Age of Onset   GER disease Mother    Diabetes Father    Cancer Father        Melanoma, Prostate   Heart disease Father    Heart disease Maternal Grandfather  Diabetes Paternal Grandfather    Breast cancer Sister 18     SOCIAL HISTORY   Social History   Tobacco Use   Smoking status: Never Smoker   Smokeless tobacco: Never Used  Substance Use Topics   Alcohol use: Yes    Alcohol/week: 0.0 standard drinks    Comment: rare   Drug use: No     MEDICATIONS    Home Medication: as per [facility]   Current Medication:  Current Facility-Administered Medications:    acetaminophen (TYLENOL) tablet 650 mg, 650 mg, Oral, Q6H PRN **OR** acetaminophen (TYLENOL) suppository 650 mg, 650 mg, Rectal, Q6H PRN, Harrie Foreman, MD   amoxicillin-clavulanate (AUGMENTIN) 875-125 MG  per tablet 1 tablet, 1 tablet, Oral, BID, Harrie Foreman, MD, 1 tablet at 05/10/18 0913   enoxaparin (LOVENOX) injection 40 mg, 40 mg, Subcutaneous, Q12H, Harrie Foreman, MD   LORazepam (ATIVAN) injection 0.25 mg, 0.25 mg, Intravenous, Once, Harrie Foreman, MD   metoprolol succinate (TOPROL-XL) 24 hr tablet 25 mg, 25 mg, Oral, Daily, Harrie Foreman, MD, 25 mg at 05/10/18 0913   ondansetron (ZOFRAN) tablet 4 mg, 4 mg, Oral, Q6H PRN **OR** ondansetron (ZOFRAN) injection 4 mg, 4 mg, Intravenous, Q6H PRN, Harrie Foreman, MD    ALLERGIES   Angiotensin receptor blockers; Lisinopril; Other; and Morphine and related     REVIEW OF SYSTEMS    Review of Systems:  Gen:  Denies  fever, sweats, chills weigh loss  HEENT: Denies blurred vision, double vision, ear pain, eye pain, hearing loss, nose bleeds, sore throat Cardiac:  No dizziness, chest pain or heaviness, chest tightness,edema Resp:   Reports shortness of breath which is intermittent and unpredictable.  Not associated with dyspnea on exertion most times  Gi: Denies swallowing difficulty, stomach pain, nausea or vomiting, diarrhea, constipation, bowel incontinence Gu:  Denies bladder incontinence, burning urine Ext:   Denies Joint pain, stiffness or swelling Skin: Denies  skin rash, easy bruising or bleeding or hives Endoc:  Denies polyuria, polydipsia , polyphagia or weight change Psych:   Denies depression, insomnia or hallucinations   Other:  All other systems negative   VS: BP (!) 142/74 (BP Location: Left Arm)    Pulse 87    Temp 36.9 C (Oral)    Resp 20    Ht 5' 5.5" (1.664 m)    Wt 111.5 kg    SpO2 98%    BMI 40.30 kg/m      PHYSICAL EXAM    GENERAL:NAD, no fevers, chills, no weakness no fatigue HEAD: Normocephalic, atraumatic.  EYES: Pupils equal, round, reactive to light. Extraocular muscles intact. No scleral icterus.  MOUTH: Moist mucosal membrane. Dentition intact. No abscess noted.  EAR,  NOSE, THROAT:  No external lesions. +left tonsillar mucopurulent exudate NECK: Supple. No thyromegaly. No nodules. No JVD.  PULMONARY: Clear to auscultation on left, right side with diminished breath sounds at the base possibly due to atelectasis CARDIOVASCULAR: S1 and S2. Regular rate and rhythm. No murmurs, rubs, or gallops. No edema. Pedal pulses 2+ bilaterally.  GASTROINTESTINAL: Soft, nontender, nondistended. No masses. Positive bowel sounds. No hepatosplenomegaly.  MUSCULOSKELETAL: No swelling, clubbing, or edema. Range of motion full in all extremities.  NEUROLOGIC: Cranial nerves II through XII are intact. No gross focal neurological deficits. Sensation intact. Reflexes intact.  SKIN: No ulceration, lesions, rashes, or cyanosis. Skin warm and dry. Turgor intact.  PSYCHIATRIC: Mood, affect within normal limits. The patient is awake, alert and oriented x 3. Insight,  judgment intact.       IMAGING    Dg Chest 2 View  Result Date: 05/10/2018 CLINICAL DATA:  Atrial fibrillation and chest pain EXAM: CHEST - 2 VIEW COMPARISON:  01/15/2013 FINDINGS: Normal heart size and mediastinal contours. No acute infiltrate or edema. No effusion or pneumothorax. No acute osseous findings. IMPRESSION: Negative chest. Electronically Signed   By: Monte Fantasia M.D.   On: 05/10/2018 04:19   Ct Head Wo Contrast  Result Date: 05/10/2018 CLINICAL DATA:  Confusion.  Atrial fibrillation. EXAM: CT HEAD WITHOUT CONTRAST TECHNIQUE: Contiguous axial images were obtained from the base of the skull through the vertex without intravenous contrast. COMPARISON:  None. FINDINGS: Brain: No evidence of acute infarction, hemorrhage, hydrocephalus, extra-axial collection or mass lesion/mass effect. Vascular: No hyperdense vessel or unexpected calcification. Skull: Normal. Negative for fracture or focal lesion. Sinuses/Orbits: Negative IMPRESSION: Negative head CT. Electronically Signed   By: Monte Fantasia M.D.   On:  05/10/2018 04:20         ASSESSMENT/PLAN     Shortness of breath, dyspnea, presyncope     -Unclear etiology as at this time     -Possibly due to hypersensitivity pneumonitis due to exposure to mold/stagnant water-we will evaluate in pulmonary clinic on outpatient basis     -Dizziness and presyncope may be signs of pulmonary hypertension which is associated with obstructive sleep apnea- will obtain 6-minute walk, BNP and consider right heart cath in the future we will discuss in pulmonary clinic as well     -Due to seasonal allergies and history of eczema as per medical chart, will also evaluate for reactive airway disease including asthma     -Need to have full allergy panel testing to detect any triggers that she may be exposed to   Throat discomfort          -Patient has a left tonsillar exudate-            -Currently on Augmentin-we will reevaluate if this has resolved once and chronic stable state on outpatient basis  Aerophagia    -May be the result of CPAP use versus antibiotic adverse effect-we will prescribe simethicone while inpatient.   Obstructive sleep apnea -Patient will need a official in lab sleep study for accurate result and titration, cannot do this while inpatient due to insurance coverage. -We will order for in lab sleep study post DC     Thank you for allowing me to participate in the care of this patient.    Patient/Family are satisfied with care plan and all questions have been answered.  This document was prepared using Dragon voice recognition software and may include unintentional dictation errors.     Ottie Glazier, M.D.  Division of [facility]

## 2018-05-10 NOTE — ED Provider Notes (Signed)
[facility] Emergency Department Provider Note   ____________________________________________   First MD Initiated Contact with Patient 05/10/18 0258     (approximate)  I have reviewed the triage vital signs and the nursing notes.   HISTORY  Chief Complaint Atrial Fibrillation    HPI Joy Houston is a 53 y.o. female who presents to the ED from home with a chief complaint of generalized malaise, atrial fibrillation and shortness of breath.  Since starting a CPAP machine several weeks ago, patient reports a myriad of mysterious symptoms.  She was seen in the ED on 04/14/2018 for angioedema thought to be secondary to lisinopril.  This was removed and she improved.  Return to the ED on 05/06/2018 with a sensation of throat constriction and was treated for allergic reaction.  She was evaluated by ENT yesterday who felt she might have a throat infection secondary to the CPAP machine and started her on amoxicillin.  She can feel when she is in and out of atrial fibrillation and states she has been in and out of it all day.  She spoke with her cardiologist via phone.  Only takes Toprol and aspirin, no anticoagulation.  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  She also states she works in a basement setting which was checked and cleared for mold.  Her office is next to a boiler and she is concerned she might have carbon monoxide poisoning.  Over the past several weeks both she and her husband have noted intermittent confusion and forgetfulness.  Tonight she is coming in for chest tightness, shortness of breath and altered mentation.  Patient denies recent fever, chills, cough, congestion, abdominal pain, nausea, vomiting.  Denies recent travel or trauma.  Has not had exposure to persons diagnosed with coronavirus.          Past Medical History:  Diagnosis Date  . Complication of anesthesia   . Eczema   . Family history of anesthesia complication     Mother - confusion  . Fatty liver   . Heart murmur    Slight - "nothing to worry about"  . Hemorrhoid   . Hypertension   . PONV (postoperative nausea and vomiting)     Patient Active Problem List   Diagnosis Date Noted  . SOB (shortness of breath) 05/10/2018  . A-fib ([place]) 03/08/2018  . Snoring 03/08/2018  . Pedal edema 03/08/2018  . Tachycardia 01/14/2018  . Chest pain 11/19/2017  . Right knee pain 03/05/2017  . Abdominal fullness in right upper quadrant 01/13/2015  . Sebaceous cyst 09/13/2014  . Change in vision 05/06/2014  . Health care maintenance 05/06/2014  . BMI 40.0-44.9, adult ([place]) 09/05/2013  . Abnormal liver function 01/08/2012  . Hypercholesterolemia 01/08/2012  . Hematuria 01/08/2012  . Diabetes mellitus without complication ([place]) 80/04/4915  . Hypertension 01/04/2012  . Lumbar herniated disc 12/15/2011    Past Surgical History:  Procedure Laterality Date  . CESAREAN SECTION    . FINGER SURGERY     pin to 3rd finger Right hand  . LUMBAR LAMINECTOMY/DECOMPRESSION MICRODISCECTOMY  12/15/2011   Procedure: LUMBAR LAMINECTOMY/DECOMPRESSION MICRODISCECTOMY 1 LEVEL;  Surgeon: Ophelia Charter, MD;  Location: [place] [facility];  Service: Neurosurgery;  Laterality: Right;  RIGHT Lumbar four-Five diskectomy  . WISDOM TOOTH EXTRACTION      Prior to Admission medications   Medication Sig Start Date End Date Taking? Authorizing Provider  amoxicillin-clavulanate (AUGMENTIN) 875-125 MG tablet Take 1 tablet by  mouth 2 (two) times daily.  05/08/18  Yes [provider]  metoprolol succinate (TOPROL-XL) 25 MG 24 hr tablet Take 25 mg by mouth daily.   Yes [provider]  predniSONE (DELTASONE) 10 MG tablet Take 2 tablets (20 mg total) by mouth daily. 05/06/18  Yes Lavonia Drafts, MD  amLODipine (NORVASC) 5 MG tablet Take 1 tablet (5 mg total) by mouth daily. Patient not taking: Reported on 05/10/2018 04/14/18 04/14/19  Merlyn Lot, MD  EPINEPHrine 0.3  mg/0.3 mL IJ SOAJ injection  05/08/18   [provider]  hydrochlorothiazide (MICROZIDE) 12.5 MG capsule TAKE 1 TO 2 CAPSULES BY MOUTH EVERY DAY Patient not taking: Reported on 05/10/2018 04/18/18   Einar Pheasant, MD    Allergies Angiotensin receptor blockers; Lisinopril; Other; and Morphine and related  Family History  Problem Relation Age of Onset  . GER disease Mother   . Diabetes Father   . Cancer Father        Melanoma, Prostate  . Heart disease Father   . Heart disease Maternal Grandfather   . Diabetes Paternal Grandfather   . Breast cancer Sister 41    Social History Social History   Tobacco Use  . Smoking status: Never Smoker  . Smokeless tobacco: Never Used  Substance Use Topics  . Alcohol use: Yes    Alcohol/week: 0.0 standard drinks    Comment: rare  . Drug use: No    Review of Systems  Constitutional: No fever/chills Eyes: No visual changes. ENT: No sore throat. Cardiovascular: Positive for chest pain. Respiratory: Positive for shortness of breath. Gastrointestinal: No abdominal pain.  No nausea, no vomiting.  No diarrhea.  No constipation. Genitourinary: Negative for dysuria. Musculoskeletal: Negative for back pain. Skin: Negative for rash. Neurological: Negative for headaches, focal weakness or numbness. Psychiatric:  Positive for anxiety.  ____________________________________________   PHYSICAL EXAM:  VITAL SIGNS: ED Triage Vitals  Enc Vitals Group     BP 05/10/18 0225 (!) 160/101     Pulse Rate 05/10/18 0225 (!) 118     Resp 05/10/18 0225 20     Temp 05/10/18 0225 (!) 97.5 F (36.4 C)     Temp Source 05/10/18 0225 Oral     SpO2 05/10/18 0225 97 %     Weight 05/10/18 0224 250 lb (113.4 kg)     Height 05/10/18 0224 5' 5"  (1.651 m)     Head Circumference --      Peak Flow --      Pain Score 05/10/18 0224 0     Pain Loc --      Pain Edu? --      Excl. in [place]? --     Constitutional: Alert and oriented.  Anxious appearing and in  mild acute distress. Eyes: Conjunctivae are normal. PERRL. EOMI. Head: Atraumatic. Nose: No congestion/rhinnorhea. Mouth/Throat: Mucous membranes are moist.  Oropharynx non-erythematous. Neck: No stridor.  No thyromegaly. Cardiovascular: Normal rate, regular rhythm. Grossly normal heart sounds.  Good peripheral circulation. Respiratory: Normal respiratory effort.  No retractions. Lungs CTAB. Gastrointestinal: Soft and nontender. No distention. No abdominal bruits. No CVA tenderness. Musculoskeletal: No lower extremity tenderness nor edema.  No joint effusions. Neurologic:  Normal speech and language. No gross focal neurologic deficits are appreciated. No gait instability. Skin:  Skin is warm, dry and intact. No rash noted. Psychiatric: Mood and affect are anxious and tearful. Speech and behavior are normal.  ____________________________________________   LABS (all labs ordered are listed, but only abnormal results are  displayed)  Labs Reviewed  CBC WITH DIFFERENTIAL/PLATELET - Abnormal; Notable for the following components:      Result Value   WBC 11.8 (*)    RBC 5.19 (*)    Hemoglobin 15.9 (*)    HCT 47.1 (*)    Neutro Abs 10.0 (*)    All other components within normal limits  COMPREHENSIVE METABOLIC PANEL - Abnormal; Notable for the following components:   Glucose, Bld 172 (*)    ALT 45 (*)    All other components within normal limits  BLOOD GAS, ARTERIAL - Abnormal; Notable for the following components:   pO2, Arterial 127 (*)    All other components within normal limits  BRAIN NATRIURETIC PEPTIDE  TROPONIN I  LIPASE, BLOOD  FIBRIN DERIVATIVES D-DIMER ([facility] ONLY)  CK  TSH  T4, FREE  COOXEMETRY PANEL   ____________________________________________  EKG  ED ECG REPORT I, , J, the attending physician, personally viewed and interpreted this ECG.   Date: 05/10/2018  EKG Time: 0232  Rate: 109  Rhythm: sinus tachycardia  Axis: Normal  Intervals:none  ST&T  Change: Nonspecific  ____________________________________________  RADIOLOGY  ED MD interpretation:  No ICH, no cardiopulmonary process  Official radiology report(s): Dg Chest 2 View  Result Date: 05/10/2018 CLINICAL DATA:  Atrial fibrillation and chest pain EXAM: CHEST - 2 VIEW COMPARISON:  01/15/2013 FINDINGS: Normal heart size and mediastinal contours. No acute infiltrate or edema. No effusion or pneumothorax. No acute osseous findings. IMPRESSION: Negative chest. Electronically Signed   By: Monte Fantasia M.D.   On: 05/10/2018 04:19   Ct Head Wo Contrast  Result Date: 05/10/2018 CLINICAL DATA:  Confusion.  Atrial fibrillation. EXAM: CT HEAD WITHOUT CONTRAST TECHNIQUE: Contiguous axial images were obtained from the base of the skull through the vertex without intravenous contrast. COMPARISON:  None. FINDINGS: Brain: No evidence of acute infarction, hemorrhage, hydrocephalus, extra-axial collection or mass lesion/mass effect. Vascular: No hyperdense vessel or unexpected calcification. Skull: Normal. Negative for fracture or focal lesion. Sinuses/Orbits: Negative IMPRESSION: Negative head CT. Electronically Signed   By: Monte Fantasia M.D.   On: 05/10/2018 04:20    ____________________________________________   PROCEDURES  Procedure(s) performed (including Critical Care):  Procedures  CRITICAL CARE Performed by: Paulette Blanch   Total critical care time: 30 minutes  Critical care time was exclusive of separately billable procedures and treating other patients.  Critical care was necessary to treat or prevent imminent or life-threatening deterioration.  Critical care was time spent personally by me on the following activities: development of treatment plan with patient and/or surrogate as well as nursing, discussions with consultants, evaluation of patient's response to treatment, examination of patient, obtaining history from patient or surrogate, ordering and performing  treatments and interventions, ordering and review of laboratory studies, ordering and review of radiographic studies, pulse oximetry and re-evaluation of patient's condition. ____________________________________________   INITIAL IMPRESSION / ASSESSMENT AND PLAN / ED COURSE  As part of my medical decision making, I reviewed the following data within the [place] History obtained from family, Nursing notes reviewed and incorporated, Labs reviewed, EKG interpreted, Old chart reviewed, Radiograph reviewed, Discussed with admitting physician and Notes from prior ED visits        53 year old female with paroxysmal A. fib, recent respiratory issues and allergic reaction since starting CPAP who presents with altered mentation, chest pain and shortness of breath.  Differential diagnosis includes but is not limited to TIA, CVA, ACS, CHF, PE, etc.   In addition  to cardiac work-up, will check thyroid panel, d-dimer, CK for complaints of her muscles twitching.  Will check ABG with co-oximetry for concerns of carbon monoxide exposure.  Will obtain CT head to evaluate for signs of CVA.  I did discuss with patient going ahead and obtaining CTA of her chest to evaluate for PE but she is hesitant to receive IV contrast so we will check d-dimer first.  We did discuss my offer to administer low-dose IV Ativan to help with her anxiety.  She does agree to 0.25 mg IV dose.  Will reassess but we have discussed hospitalization for A. fib, chest pain and shortness of breath.   Clinical Course as of May 10 614  Thu May 10, 2018  0454 Have discussed with hospitalist Dr. Marcille Blanco who will evaluate patient in the emergency department for admission.   [JS]    Clinical Course User Index [JS] Paulette Blanch, MD     ____________________________________________   FINAL CLINICAL IMPRESSION(S) / ED DIAGNOSES  Final diagnoses:  Paroxysmal atrial fibrillation (HCC)  SOB (shortness of breath)  Chest  pain, unspecified type     ED Discharge Orders    None       Note:  This document was prepared using Dragon voice recognition software and may include unintentional dictation errors.   Paulette Blanch, MD 05/10/18 [id number]

## 2018-05-10 NOTE — Progress Notes (Addendum)
SATURATION QUALIFICATIONS: (This note is used to comply with regulatory documentation for home oxygen)  Patient Saturations on Room Air at Rest = 95%  Patient Saturations on Room Air while Ambulating = 98%  Patient Saturations on n/a Liters of oxygen while Ambulating = n/a%  Please briefly explain why patient needs home oxygen: Pt ambulated around nurses station x1, stand by assist, no sob, no cp noted. Will continue to monitor.

## 2018-05-10 NOTE — Discharge Instructions (Signed)
Follow-up with Dr. Nehemiah Massed within 1 week.  Follow-up with Dr. Lanney Gins (pulmonology) within 1 week, at this time you may have an outpatient sleep study scheduled.   Follow-up with your primary care doctor within 5 days to assess your shortness of breath and overall symptoms.   Take Simethicone for gas and bloating as needed every 6 hours for gas and bloating. This is a chewable tablet.  Take a probiotic while on antibiotics, you may also continue to take this regularly, just touch base with your PCP. Take 2 capsules in the morning, preferably on an empty stomach with a glass of water to improve absorption.  Culturelle is the brand you were prescribed today, it can be purchased over-the-counter.

## 2018-05-10 NOTE — Progress Notes (Signed)
PHARMACIST - PHYSICIAN COMMUNICATION  CONCERNING:  Enoxaparin (Lovenox) for DVT Prophylaxis   RECOMMENDATION: Patient was prescribed enoxaprin 40mg  q24 hours for VTE prophylaxis.   Filed Weights   05/10/18 0224  Weight: 250 lb (113.4 kg)   Body mass index is 41.6 kg/m.  Estimated Creatinine Clearance: 105.7 mL/min (by C-G formula based on SCr of 0.64 mg/dL).  Based on [place] patient is candidate for enoxaparin 40mg  every 12 hour dosing due to BMI being >40.  DESCRIPTION: Pharmacy has adjusted enoxaparin dose per [facility] policy.  Patient is now receiving enoxaparin 40mg  every 12 hours.   Pernell Dupre, PharmD, BCPS Clinical Pharmacist 05/10/2018 6:51 AM

## 2018-05-10 NOTE — Progress Notes (Signed)
Cardiology Consultation Note    Patient ID: Joy Houston, MRN: [id number], DOB/AGE: Jul 27, 1965 53 y.o. Admit date: 05/10/2018   Date of Consult: 05/10/2018 Primary Physician: Einar Pheasant, MD Primary Cardiologist: Dr. Nehemiah Massed  Chief Complaint: tachycardia/sob Reason for Consultation: afib Requesting MD: Dr. Manuella Ghazi  HPI: Joy Houston is a 53 y.o. female with history of paroxysmal atrial fibrillation, hypertension who was admitted with sensation of rapid irregular heartbeat shortness of breath.  Patient has been extensively evaluated as an outpatient per Dr. Nehemiah Massed.  Echocardiogram has revealed no significant valvular disease.  Her chads score is 1 for female gender.  She has been treated with metoprolol succinate at 25 mg daily for this.  She works in a damp environment where she was concerned about exposure to mold and carbon monoxide.  Mold was not found in her work environment.  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  No evidence of carbon monoxide poisoning on presentation to the ER.  She recently had an episode of angioedema and she was taken off her ACE inhibitor.  She does have transient episodes of lower extremity edema.  These episodes do not last very long.  She had an episode shortness of breath and a rapid heart rate last p.m.  She had prednisone that she had been given for the angioedema which she had not taken.  She took a tablet and had improvement fairly quickly after taking this.  She presented to the emergency room where she complained of tachycardia and shortness of breath.  Electrocardiogram showed sinus tachycardia.  CBC was abnormal with a white count 11.8 although the patient had taken a prednisone.  Chest x-ray was unremarkable by radiology reading.  Glucose was 172 potassium was 3.7 renal function was normal.  BNP was normal at 84.  Troponin was normal.  Fibrin derivatives were is normal at 397.  TSH and T4 were normal.  Carboxyhemoglobin was normal.  Meth hemoglobin  was normal, arterial blood gas showed a pH of 7.42 with a PCO2 of 35 PO2 of 127 with an O2 sat of 98% on 0.28 FiO2.  She denies any illicit drug use.  She had some allergy testing at ENT but has not been fully evaluated.  She does have a St. Bernard dog at home.  She was diagnosed with sleep apnea by home sleep study and is attempting to learn how to use CPAP.  She has been intermittently compliant with this.  Past Medical History:  Diagnosis Date  . Complication of anesthesia   . Eczema   . Family history of anesthesia complication    Mother - confusion  . Fatty liver   . Heart murmur    Slight - "nothing to worry about"  . Hemorrhoid   . Hypertension   . PONV (postoperative nausea and vomiting)       Surgical History:  Past Surgical History:  Procedure Laterality Date  . CESAREAN SECTION    . FINGER SURGERY     pin to 3rd finger Right hand  . LUMBAR LAMINECTOMY/DECOMPRESSION MICRODISCECTOMY  12/15/2011   Procedure: LUMBAR LAMINECTOMY/DECOMPRESSION MICRODISCECTOMY 1 LEVEL;  Surgeon: Ophelia Charter, MD;  Location: [place] [facility];  Service: Neurosurgery;  Laterality: Right;  RIGHT Lumbar four-Five diskectomy  . WISDOM TOOTH EXTRACTION       Home Meds: Prior to Admission medications   Medication Sig Start Date End Date Taking? Authorizing Provider  amoxicillin-clavulanate (AUGMENTIN) 875-125 MG tablet Take 1 tablet by mouth 2 (two) times daily.  05/08/18  Yes [provider]  metoprolol succinate (TOPROL-XL) 25 MG 24 hr tablet Take 25 mg by mouth daily.   Yes [provider]  predniSONE (DELTASONE) 10 MG tablet Take 2 tablets (20 mg total) by mouth daily. 05/06/18  Yes Lavonia Drafts, MD  amLODipine (NORVASC) 5 MG tablet Take 1 tablet (5 mg total) by mouth daily. Patient not taking: Reported on 05/10/2018 04/14/18 04/14/19  Merlyn Lot, MD  EPINEPHrine 0.3 mg/0.3 mL IJ SOAJ injection  05/08/18   [provider]  hydrochlorothiazide (MICROZIDE) 12.5 MG  capsule TAKE 1 TO 2 CAPSULES BY MOUTH EVERY DAY Patient not taking: Reported on 05/10/2018 04/18/18   Einar Pheasant, MD    Inpatient Medications:  . amoxicillin-clavulanate  1 tablet Oral BID  . enoxaparin (LOVENOX) injection  40 mg Subcutaneous Q12H  . LORazepam  0.25 mg Intravenous Once  . metoprolol succinate  25 mg Oral Daily     Allergies:  Allergies  Allergen Reactions  . Angiotensin Receptor Blockers     Other reaction(s): Angioedema Had past reaction of arb and angioedema with ace  . Lisinopril Swelling  . Other   . Morphine And Related     Slight itching around nose area    Social History   Socioeconomic History  . Marital status: Married    Spouse name: Not on file  . Number of children: 2  . Years of education: Not on file  . Highest education level: Not on file  Occupational History    Employer: [employer]  . Financial resource strain: Not on file  . Food insecurity:    Worry: Not on file    Inability: Not on file  . Transportation needs:    Medical: Not on file    Non-medical: Not on file  Tobacco Use  . Smoking status: Never Smoker  . Smokeless tobacco: Never Used  Substance and Sexual Activity  . Alcohol use: Yes    Alcohol/week: 0.0 standard drinks    Comment: rare  . Drug use: No  . Sexual activity: Not on file  Lifestyle  . Physical activity:    Days per week: Not on file    Minutes per session: Not on file  . Stress: Not on file  Relationships  . Social connections:    Talks on phone: Not on file    Gets together: Not on file    Attends religious service: Not on file    Active member of club or organization: Not on file    Attends meetings of clubs or organizations: Not on file    Relationship status: Not on file  . Intimate partner violence:    Fear of current or ex partner: Not on file    Emotionally abused: Not on file    Physically abused: Not on file    Forced sexual activity: Not on file  Other Topics  Concern  . Not on file  Social History Narrative  . Not on file     Family History  Problem Relation Age of Onset  . GER disease Mother   . Diabetes Father   . Cancer Father        Melanoma, Prostate  . Heart disease Father   . Heart disease Maternal Grandfather   . Diabetes Paternal Grandfather   . Breast cancer Sister 2     Review of Systems: A 12-system review of systems was performed and is negative except as noted in the HPI.  Labs: Recent Labs    05/10/18 0246  CKTOTAL 184  TROPONINI <0.03   Lab Results  Component Value Date   WBC 11.8 (H) 05/10/2018   HGB 15.9 (H) 05/10/2018   HCT 47.1 (H) 05/10/2018   MCV 90.8 05/10/2018   PLT 310 05/10/2018    Recent Labs  Lab 05/10/18 0246  NA 139  K 3.7  CL 105  CO2 22  BUN 10  CREATININE 0.64  CALCIUM 9.1  PROT 7.7  BILITOT 0.7  ALKPHOS 65  ALT 45*  AST 35  GLUCOSE 172*   Lab Results  Component Value Date   CHOL 175 11/17/2017   HDL 55.60 11/17/2017   LDLCALC 100 (H) 11/17/2017   TRIG 94.0 11/17/2017   No results found for: DDIMER  Radiology/Studies:  Dg Chest 2 View  Result Date: 05/10/2018 CLINICAL DATA:  Atrial fibrillation and chest pain EXAM: CHEST - 2 VIEW COMPARISON:  01/15/2013 FINDINGS: Normal heart size and mediastinal contours. No acute infiltrate or edema. No effusion or pneumothorax. No acute osseous findings. IMPRESSION: Negative chest. Electronically Signed   By: Monte Fantasia M.D.   On: 05/10/2018 04:19   Ct Head Wo Contrast  Result Date: 05/10/2018 CLINICAL DATA:  Confusion.  Atrial fibrillation. EXAM: CT HEAD WITHOUT CONTRAST TECHNIQUE: Contiguous axial images were obtained from the base of the skull through the vertex without intravenous contrast. COMPARISON:  None. FINDINGS: Brain: No evidence of acute infarction, hemorrhage, hydrocephalus, extra-axial collection or mass lesion/mass effect. Vascular: No hyperdense vessel or unexpected calcification. Skull: Normal. Negative for  fracture or focal lesion. Sinuses/Orbits: Negative IMPRESSION: Negative head CT. Electronically Signed   By: Monte Fantasia M.D.   On: 05/10/2018 04:20    Wt Readings from Last 3 Encounters:  05/10/18 111.5 kg  04/14/18 120.2 kg  03/26/18 113.4 kg    EKG: Sinus tachycardia  Physical Exam: Somewhat anxious female in no acute distress Blood pressure (!) 142/74, pulse 87, temperature 98.4 F (36.9 C), temperature source Oral, resp. rate 20, height 5' 5.5" (1.664 m), weight 111.5 kg, SpO2 98 %. Body mass index is 40.3 kg/m. General: Well developed, well nourished, in no acute distress. Head: Normocephalic, atraumatic, sclera non-icteric, no xanthomas, nares are without discharge.  Neck: Negative for carotid bruits. JVD not elevated. Lungs: Clear bilaterally to auscultation without wheezes, rales, or rhonchi. Breathing is unlabored. Heart: RRR with S1 S2. No murmurs, rubs, or gallops appreciated. Abdomen: Soft, non-tender, non-distended with normoactive bowel sounds. No hepatomegaly. No rebound/guarding. No obvious abdominal masses. Msk:  Strength and tone appear normal for age. Extremities: No clubbing or cyanosis. No edema.  Distal pedal pulses are 2+ and equal bilaterally. Neuro: Alert and oriented X 3. No facial asymmetry. No focal deficit. Moves all extremities spontaneously. Psych:  Responds to questions appropriately with a normal affect.     Assessment and Plan  53 year old female with history of paroxysmal atrial fibrillation with a recent complaint angioedema felt to be secondary to lisinopril who presented to the emergency room with complaints of rapid heart rate and shortness of breath.  She has had paroxysmal atrial fibrillation in the past.  EKG in the ER and subsequently has shown sinus tachycardia.  Etiology of this is unclear.  She has been extensively worked up as an outpatient with baseline allergy testing, ENT evaluation, extensive noninvasive cardiology evaluation with  no significant structural or valvular heart disease.  She has been on metoprolol with fairly good success.  She is ruled out for myocardial infarction.  Laboratories chest x-ray have been unremarkable.  No evidence of carbon monoxide poisoning.  Thyroid is normal.  Currently on 1 L of nasal cannula oxygen.  We will wean off of this and follow rhythm.  Does not appear to require any further cardiac work-up as an inpatient.  Would agree with pulmonology and subsequent allergy testing.  Would continue with metoprolol.  Would not anticoagulate.  Follow-up with Dr. Nehemiah Massed as an outpatient.  Signed, Teodoro Spray MD 05/10/2018, 11:03 AM Pager: [phone]

## 2018-05-10 NOTE — Discharge Summary (Signed)
[facility] [place] NAME: Joy Houston    MR#:  [id number]  DATE OF BIRTH:  December 16, 1965  DATE OF ADMISSION:  05/10/2018   ADMITTING PHYSICIAN: Harrie Foreman, MD  DATE OF DISCHARGE: 05/10/2018  4:07 PM  PRIMARY CARE PHYSICIAN: Einar Pheasant, MD   ADMISSION DIAGNOSIS:  Paroxysmal atrial fibrillation ([facility]) [I48.0] SOB (shortness of breath) [R06.02] Chest pain, unspecified type [R07.9] DISCHARGE DIAGNOSIS:  Active Problems:   SOB (shortness of breath)  SECONDARY DIAGNOSIS:   Past Medical History:  Diagnosis Date  . Complication of anesthesia   . Eczema   . Family history of anesthesia complication    Mother - confusion  . Fatty liver   . Heart murmur    Slight - "nothing to worry about"  . Hemorrhoid   . Hypertension   . PONV (postoperative nausea and vomiting)    HOSPITAL COURSE:   This is a 53 year old female with past medical history of hypertension and paroxysmal atrial fibrillation admitted for shortness of breath associated with her atrial fibrillation.  Coincidentally, the patient also had an episode of angioedema 3 weeks ago. Since that time she has not had any more mucosal swelling but admits to some swelling of her feet bilaterally that starts off as itching. The patient took 40 mg of prednisone which she had leftover from her prescription following the episode of angioedema, and it helped with her pedal swelling. Currently she has no lower extremity edema, but last night her shortness of breath became distressing although she denied chest pain. She denies cough but admits to a soreness in her throat that ENT visualized on laryngoscopy as a small pocket of infection that may be related to her CPAP. Also of note, the patient has been unable to tolerate her CPAP as is not yet titrated appropriately. For the above reasons the emergency department staff called the hospitalist service for further evaluation.  She has  been extensively worked up as an outpatient with baseline allergy testing, ENT evaluation, extensive noninvasive cardiology evaluation with no significant structural or valvular heart disease. She has been on metoprolol with success.  She is ruled out for myocardial infarction. Laboratories chest x-ray have been unremarkable. No evidence of carbon monoxide poisoning. Thyroid is normal. Was on 1 L of nasal cannula oxygen.  We will wean off of this and follow rhythm. No abnormalities. Does not appear to require any further cardiac work-up as an inpatient.   1.  Shortness of breath: Multifactorial. The patient usually shortness of breath with atrial fibrillation, however differential diagnosis includes provoked bronchial spasm (the patient works near a boiler and has had episodes of shortness of breath as well as confusion and cognitive slowing that seem to resolve when not at work), transient CHF possibly due to A. fib with RVR or discordant systolic function, or more persistent atrial fibrillation.  Consult cardiology as well as pulmonology.  She requested home oxygen, has high O2 sats and does not qualify, RN walked her on day of discharge. Cardiology/Dr. Ubaldo Glassing: Continue metoprolol. No need for further work-up. Outpatient follow-up with Dr. Nehemiah Massed scheduled.  Pulmonology/Dr. Lanney Gins: could be due to hypersensitivity pneumonitis, will follow-up in outpatient clinic, scheduled. Will rule out pulmonary hypertension with 6-minute walk, BNP, consideration of cath. Evaluation to come for reactive airway disease. Full allergy testing panel to follow.  2.  Atrial fibrillation: Paroxysmal; continue metoprolol.  No anticoagulation per cardiology.  3.  Hypertension: Uncontrolled on admission but better controlled  today; reassess antihypertensives as the patient cannot take lisinopril. Consider resuming hydrochlorothiazide with outpatient PCP follow-up. She states she only takes metoprolol. Continue for now.  4.   OSA: Patient will need overnight monitored sleep study as opposed to outpatient sleep study to more accurately titrate CPAP pressure. To be ordered and scheduled with pulmonology.   5. Throat discomfort with left tonsillar exudate: Secondary to CPAP induced infection, continue Augmentin. Added probiotic as she expressed concern for yeast infection and overall GI discomfort  6. Bloating, gas, likely aerophagia: could be due to CPAP per pulmonology. Simethicone script provided.  7. DVT prophylaxis: Lovenox while admitted  8. Angioedema, historical: has benadryl, epi pen. Continue to avoid lisinopril.  DISCHARGE CONDITIONS:  stable CONSULTS OBTAINED:  Treatment Team:  Ottie Glazier, MD Teodoro Spray, MD DRUG ALLERGIES:   Allergies  Allergen Reactions  . Angiotensin Receptor Blockers     Other reaction(s): Angioedema Had past reaction of arb and angioedema with ace  . Lisinopril Swelling  . Other   . Morphine And Related     Slight itching around nose area   DISCHARGE MEDICATIONS:   Allergies as of 05/10/2018      Reactions   Angiotensin Receptor Blockers    Other reaction(s): Angioedema Had past reaction of arb and angioedema with ace   Lisinopril Swelling   Other    Morphine And Related    Slight itching around nose area      Medication List    STOP taking these medications   amLODipine 5 MG tablet Commonly known as:  NORVASC   hydrochlorothiazide 12.5 MG capsule Commonly known as:  MICROZIDE     TAKE these medications   amoxicillin-clavulanate 875-125 MG tablet Commonly known as:  AUGMENTIN Take 1 tablet by mouth 2 (two) times daily.   Culturelle Digestive Health Caps Take 2 capsules by mouth every morning for 7 days. Best taken on an empty stomach with a glass of water.   EPINEPHrine 0.3 mg/0.3 mL Soaj injection Commonly known as:  EPI-PEN   metoprolol succinate 25 MG 24 hr tablet Commonly known as:  TOPROL-XL Take 25 mg by mouth daily.    predniSONE 10 MG tablet Commonly known as:  DELTASONE Take 2 tablets (20 mg total) by mouth daily.   simethicone 80 MG chewable tablet Commonly known as:  MYLICON Chew 1 tablet (80 mg total) by mouth every 6 (six) hours as needed for up to 30 days for flatulence.       DISCHARGE INSTRUCTIONS:   DIET:  Regular diet DISCHARGE CONDITION:  Stable ACTIVITY:  Activity as tolerated OXYGEN:  Home Oxygen: No.  Oxygen Delivery: room air DISCHARGE LOCATION:  home   If you experience worsening of your admission symptoms, develop shortness of breath, life threatening emergency, suicidal or homicidal thoughts you must seek medical attention immediately by calling 911 or calling your MD immediately if your symptoms are severe.  You Must read complete instructions/literature along with all the possible adverse reactions/side effects for all the medicines you take and that have been prescribed to you. Take any new medicines only after you have completely understood and accept all the possible adverse reactions/side effects.   Please note  You were cared for by a hospitalist during your hospital stay. If you have any questions about your discharge medications or the care you received while you were in the hospital after you are discharged, you can call the unit and asked to speak with the hospitalist on call if  the hospitalist that took care of you is not available. Once you are discharged, your primary care physician will handle any further medical issues. Please note that NO REFILLS for any discharge medications will be authorized once you are discharged, as it is imperative that you return to your primary care physician (or establish a relationship with a primary care physician if you do not have one) for your aftercare needs so that they can reassess your need for medications and monitor your lab values.    On the day of Discharge:  VITAL SIGNS:  Blood pressure 139/76, pulse 77, temperature  98.4 F (36.9 C), temperature source Oral, resp. rate 20, height 5' 5.5" (1.664 m), weight 111.5 kg, SpO2 98 %. PHYSICAL EXAMINATION:  GENERAL:  53 y.o.-year-old patient lying in the bed with no acute distress.  EYES: Pupils equal, round, reactive to light and accommodation. No scleral icterus. Extraocular muscles intact.  HEENT: Head atraumatic, normocephalic. Oropharynx and nasopharynx clear.  NECK:  Supple, no jugular venous distention. No thyroid enlargement, no tenderness.  LUNGS: Normal breath sounds bilaterally, no wheezing, rales,rhonchi or crepitation. No use of accessory muscles of respiration.  CARDIOVASCULAR: S1, S2 normal. No murmurs, rubs, or gallops.  ABDOMEN: Soft, non-tender, non-distended. Bowel sounds present. No organomegaly or mass.  EXTREMITIES: No pedal edema, cyanosis, or clubbing.  NEUROLOGIC: Cranial nerves II through XII are intact. Muscle strength 5/5 in all extremities. Sensation intact. Gait not checked.  PSYCHIATRIC: The patient is alert and oriented x 3.  SKIN: No obvious rash, lesion, or ulcer.  DATA REVIEW:   CBC Recent Labs  Lab 05/10/18 0246  WBC 11.8*  HGB 15.9*  HCT 47.1*  PLT 310    Chemistries  Recent Labs  Lab 05/10/18 0246  NA 139  K 3.7  CL 105  CO2 22  GLUCOSE 172*  BUN 10  CREATININE 0.64  CALCIUM 9.1  AST 35  ALT 45*  ALKPHOS 65  BILITOT 0.7   RADIOLOGY:  Dg Chest 2 View  Result Date: 05/10/2018 CLINICAL DATA:  Atrial fibrillation and chest pain EXAM: CHEST - 2 VIEW COMPARISON:  01/15/2013 FINDINGS: Normal heart size and mediastinal contours. No acute infiltrate or edema. No effusion or pneumothorax. No acute osseous findings. IMPRESSION: Negative chest. Electronically Signed   By: Monte Fantasia M.D.   On: 05/10/2018 04:19   Ct Head Wo Contrast  Result Date: 05/10/2018 CLINICAL DATA:  Confusion.  Atrial fibrillation. EXAM: CT HEAD WITHOUT CONTRAST TECHNIQUE: Contiguous axial images were obtained from the base of the  skull through the vertex without intravenous contrast. COMPARISON:  None. FINDINGS: Brain: No evidence of acute infarction, hemorrhage, hydrocephalus, extra-axial collection or mass lesion/mass effect. Vascular: No hyperdense vessel or unexpected calcification. Skull: Normal. Negative for fracture or focal lesion. Sinuses/Orbits: Negative IMPRESSION: Negative head CT. Electronically Signed   By: Monte Fantasia M.D.   On: 05/10/2018 04:20    Management plans discussed with the patient and/or family and they are in agreement.  CODE STATUS: Prior   TOTAL TIME TAKING CARE OF THIS PATIENT: 45 minutes.    Lashawndra Lampkins PA-C on 05/10/2018 at 7:18 PM  Between 7am to 6pm - Pager - [phone]  After 6pm go to [email or web address] - Proofreader  [facility]  Office  [phone]  CC: Primary care physician; Einar Pheasant, MD

## 2018-05-11 ENCOUNTER — Encounter: Payer: Self-pay | Admitting: Internal Medicine

## 2018-05-13 NOTE — Telephone Encounter (Signed)
See my chart messages.  Please call pt.  With all of these concerns, she needs to be evaluated.  We can get an earlier appt with pulmonary if desires. This may save her two trips.

## 2018-05-14 NOTE — Telephone Encounter (Signed)
Spoke with patient. She is feeling some better. She has an appt with Dr Nehemiah Massed on Wednesday. Advised that we could move appt up with pulmonary. She wants to see pulmonary at [facility].

## 2018-05-15 LAB — BLOOD GAS, ARTERIAL
Acid-base deficit: 1.3 mmol/L (ref 0.0–2.0)
Bicarbonate: 22.7 mmol/L (ref 20.0–28.0)
FIO2: 0.28
O2 Saturation: 98.9 %
Patient temperature: 37
pCO2 arterial: 35 mmHg (ref 32.0–48.0)
pH, Arterial: 7.42 (ref 7.350–7.450)
pO2, Arterial: 127 mmHg — ABNORMAL HIGH (ref 83.0–108.0)

## 2018-05-17 NOTE — Telephone Encounter (Signed)
Pulmonary appt moved up.

## 2018-05-18 DIAGNOSIS — Z87898 Personal history of other specified conditions: Secondary | ICD-10-CM | POA: Diagnosis not present

## 2018-05-18 DIAGNOSIS — G4733 Obstructive sleep apnea (adult) (pediatric): Secondary | ICD-10-CM | POA: Diagnosis not present

## 2018-05-19 LAB — COMPREHENSIVE METABOLIC PANEL
ALT: 45 U/L — ABNORMAL HIGH (ref 0–44)
AST: 35 U/L (ref 15–41)
Albumin: 4.2 g/dL (ref 3.5–5.0)
Alkaline Phosphatase: 65 U/L (ref 38–126)
Anion gap: 12 (ref 5–15)
BUN: 10 mg/dL (ref 6–20)
CO2: 22 mmol/L (ref 22–32)
Calcium: 9.1 mg/dL (ref 8.9–10.3)
Chloride: 105 mmol/L (ref 98–111)
Creatinine, Ser: 0.64 mg/dL (ref 0.44–1.00)
GFR calc Af Amer: >60 mL/min (ref >60)
GFR calc non Af Amer: >60 mL/min (ref >60)
GLUCOSE: 172 mg/dL — AB (ref 70–99)
Potassium: 3.7 mmol/L (ref 3.5–5.1)
Sodium: 139 mmol/L (ref 135–145)
TOTAL PROTEIN: 7.7 g/dL (ref 6.5–8.1)
Total Bilirubin: 0.7 mg/dL (ref 0.3–1.2)

## 2018-05-19 LAB — CBC WITH DIFFERENTIAL/PLATELET
Abs Immature Granulocytes: 0.03 10*3/uL (ref 0.00–0.07)
Basophils Absolute: 0 10*3/uL (ref 0.0–0.1)
Basophils Relative: 0 %
Eosinophils Absolute: 0 10*3/uL (ref 0.0–0.5)
Eosinophils Relative: 0 %
HCT: 47.1 % — ABNORMAL HIGH (ref 36.0–46.0)
Hemoglobin: 15.9 g/dL — ABNORMAL HIGH (ref 12.0–15.0)
IMMATURE GRANULOCYTES: 0 %
Lymphocytes Relative: 12 %
Lymphs Abs: 1.4 10*3/uL (ref 0.7–4.0)
MCH: 30.6 pg (ref 26.0–34.0)
MCHC: 33.8 g/dL (ref 30.0–36.0)
MCV: 90.8 fL (ref 80.0–100.0)
Monocytes Absolute: 0.4 10*3/uL (ref 0.1–1.0)
Monocytes Relative: 3 %
Neutro Abs: 10 10*3/uL — ABNORMAL HIGH (ref 1.7–7.7)
Neutrophils Relative %: 85 %
Platelets: 310 10*3/uL (ref 150–400)
RBC: 5.19 MIL/uL — ABNORMAL HIGH (ref 3.87–5.11)
RDW: 12.9 % (ref 11.5–15.5)
WBC: 11.8 10*3/uL — ABNORMAL HIGH (ref 4.0–10.5)
nRBC: 0 % (ref 0.0–0.2)

## 2018-05-19 LAB — CK: Total CK: 184 U/L (ref 38–234)

## 2018-05-23 ENCOUNTER — Encounter: Payer: Self-pay | Admitting: Internal Medicine

## 2018-05-23 NOTE — Telephone Encounter (Signed)
Please call pt and see if she is agreeable to a virtual visit.

## 2018-05-24 NOTE — Telephone Encounter (Signed)
Virtual visit scheduled.  

## 2018-05-24 NOTE — Telephone Encounter (Signed)
LMTCB

## 2018-05-25 ENCOUNTER — Ambulatory Visit (INDEPENDENT_AMBULATORY_CARE_PROVIDER_SITE_OTHER): Payer: BLUE CROSS/BLUE SHIELD | Admitting: Internal Medicine

## 2018-05-25 ENCOUNTER — Encounter: Payer: Self-pay | Admitting: Internal Medicine

## 2018-05-25 DIAGNOSIS — I4891 Unspecified atrial fibrillation: Secondary | ICD-10-CM | POA: Diagnosis not present

## 2018-05-25 DIAGNOSIS — G4730 Sleep apnea, unspecified: Secondary | ICD-10-CM | POA: Diagnosis not present

## 2018-05-25 DIAGNOSIS — R945 Abnormal results of liver function studies: Secondary | ICD-10-CM

## 2018-05-25 DIAGNOSIS — E119 Type 2 diabetes mellitus without complications: Secondary | ICD-10-CM

## 2018-05-25 DIAGNOSIS — I10 Essential (primary) hypertension: Secondary | ICD-10-CM

## 2018-05-25 DIAGNOSIS — R197 Diarrhea, unspecified: Secondary | ICD-10-CM

## 2018-05-25 DIAGNOSIS — Z87898 Personal history of other specified conditions: Secondary | ICD-10-CM

## 2018-05-25 NOTE — Progress Notes (Addendum)
Patient ID: Joy Houston, female   DOB: 1965/12/19, 53 y.o.   MRN: [id number] Virtual Visit via Video Note  I connected with Joy Houston on 05/25/18 at 10:00 AM EDT by a video enabled telemedicine application and verified that I am speaking with the correct person using two identifiers.  Location patient: work Acupuncturist  Persons participating in the virtual visit: patient, provider  I discussed the limitations of evaluation and management by telemedicine.  This visit type was conducted due to national recommendations for restrictions regarding the COVID-19 pandemic.  This format is felt to be most appropriate for this patient at this time.   The patient expressed understanding and agreed to proceed.   HPI: She is scheduled for a work in appt to discuss recent hospitalization, ER visits and answer questions.  She was seen 04/12/18 with angioedema.  Was given decadron and zofran.  Swelling resolved.  Lisinopril was stopped.  Saw cardiology after that ER visit (04/16/18).  Recommended continuing metoprolol and hctz.  Back to ER 05/06/18 with increased sensitivity in her throat.  Had eaten greek yogurt, blackberries.  Was given IV benadryl and symptoms resolved.  ENT evaluated.  Laryngoscopy revealed small pocket of infection left side tonsils.  Treated with augmentin.  Also admitted 05/10/18 with sob and afib.  Given epi pen.  Underwent RAST testing and testing for mold.  Saw pulmonary 05/18/18.  Note reviewed.  Concern over hypersensitivity pneumonitis.  Was informed testing - sensitivity to candida.  Has concerns about taking diflucan.  Discussed with her at length today.  She desires to hold on taking until she can speak with pulmonologist in more detail.  States he will return next week.  Planning for f/u sleep study.  Has noticed some increased loose stools/diarrhea.  Some burping.  Feels cpap is too high.  Concerned about continuing.  Has adjusted her diet.  Lost weight.  Completed  abx several days ago.  No chest congestion.  No cough.  Occasionally will notice some racing - heart.  On metoprolol.  Does feel is better.  Also had questions about the location of her work.  Works in the basement.  Has been checked for mold.  States present.  Did not know how much affect this had on her symptoms.  Increased stress related to above.  Discussed with her today.  Also discussed blood pressure.  She is not checking regularly.  Only on metoprolol now.     ROS: See pertinent positives and negatives per HPI.  Past Medical History:  Diagnosis Date  . Complication of anesthesia   . Eczema   . Family history of anesthesia complication    Mother - confusion  . Fatty liver   . Heart murmur    Slight - "nothing to worry about"  . Hemorrhoid   . Hypertension   . PONV (postoperative nausea and vomiting)     Past Surgical History:  Procedure Laterality Date  . CESAREAN SECTION    . FINGER SURGERY     pin to 3rd finger Right hand  . LUMBAR LAMINECTOMY/DECOMPRESSION MICRODISCECTOMY  12/15/2011   Procedure: LUMBAR LAMINECTOMY/DECOMPRESSION MICRODISCECTOMY 1 LEVEL;  Surgeon: Ophelia Charter, MD;  Location: [facility];  Service: Neurosurgery;  Laterality: Right;  RIGHT Lumbar four-Five diskectomy  . WISDOM TOOTH EXTRACTION      Family History  Problem Relation Age of Onset  . GER disease Mother   . Diabetes Father   . Cancer Father  Melanoma, Prostate  . Heart disease Father   . Heart disease Maternal Grandfather   . Diabetes Paternal Grandfather   . Breast cancer Sister 37    SOCIAL HX: reviewed.    Current Outpatient Medications:  .  fluconazole (DIFLUCAN) 200 MG tablet, Take 200 mg by mouth daily., Disp: , Rfl:  .  EPINEPHrine 0.3 mg/0.3 mL IJ SOAJ injection, , Disp: , Rfl:  .  metoprolol succinate (TOPROL-XL) 25 MG 24 hr tablet, Take 25 mg by mouth daily., Disp: , Rfl:  .  simethicone (MYLICON) 80 MG chewable tablet, Chew 1 tablet (80 mg total) by mouth  every 6 (six) hours as needed for up to 30 days for flatulence., Disp: 30 tablet, Rfl: 0  EXAM:  GENERAL: alert, oriented, appears well and in no acute distress  HEENT: atraumatic, conjunttiva clear, no obvious abnormalities on inspection of external nose  NECK: normal movements of the head and neck  LUNGS: on inspection no signs of respiratory distress, breathing rate appears normal, no obvious gross SOB, gasping or wheezing  CV: no obvious cyanosis  PSYCH/NEURO: pleasant and cooperative, no obvious depression or anxiety, speech and thought processing grossly intact  ASSESSMENT AND PLAN:  Discussed the following assessment and plan:  Atrial fibrillation, unspecified type ([facility])  Diabetes mellitus without complication (HCC)  Essential hypertension  Sleep apnea, unspecified type  Abnormal liver function  Diarrhea, unspecified type  History of angioedema     I discussed the assessment and treatment plan with the patient. The patient was provided an opportunity to ask questions and all were answered. The patient agreed with the plan and demonstrated an understanding of the instructions.   The patient was advised to call back or seek an in-person evaluation if the symptoms worsen or if the condition fails to improve as anticipated.  I provided 45 minutes of non-face-to-face time during this encounter.   Einar Pheasant, MD

## 2018-05-26 DIAGNOSIS — G4733 Obstructive sleep apnea (adult) (pediatric): Secondary | ICD-10-CM | POA: Diagnosis not present

## 2018-05-27 ENCOUNTER — Encounter: Payer: Self-pay | Admitting: Internal Medicine

## 2018-05-27 DIAGNOSIS — R197 Diarrhea, unspecified: Secondary | ICD-10-CM | POA: Insufficient documentation

## 2018-05-27 DIAGNOSIS — G4730 Sleep apnea, unspecified: Secondary | ICD-10-CM | POA: Insufficient documentation

## 2018-05-27 DIAGNOSIS — Z87898 Personal history of other specified conditions: Secondary | ICD-10-CM | POA: Insufficient documentation

## 2018-05-27 NOTE — Assessment & Plan Note (Signed)
Diarrhea as outlined.  Recent abx.  Just completed.  Start probiotic as directed.  Discussed concern over possible c.diff.  She wants to try probiotic.  If persistent, will need to do stool testing.

## 2018-05-27 NOTE — Assessment & Plan Note (Signed)
Initially felt to be related to lisinopril.  Has had recurrence since - of sensation in her throat.  Seeing pulmonary.  Concern over hypersensitivity pneumonitis, etc.  Considering w/up for asthma.  Continue f/u with pulmonary.  Had questions about taking diflucan.  Plans to discuss with pulmonary.

## 2018-05-27 NOTE — Assessment & Plan Note (Signed)
With known sleep apnea.  In the process of being reevaluated.  Has f/u sleep study planned.

## 2018-05-27 NOTE — Assessment & Plan Note (Signed)
She is adjusting her diet.  Has lost weight.  Follow liver function tests.

## 2018-05-27 NOTE — Assessment & Plan Note (Signed)
Only on metoprolol now.  Discussed with her to start monitoring blood pressures.  Send in readings.  Follow pressures.  Follow metabolic panel.

## 2018-05-27 NOTE — Assessment & Plan Note (Signed)
She has adjusted her diet.  Discussed continued low carb diet and exercise.  Follow met b and a1c.

## 2018-05-27 NOTE — Assessment & Plan Note (Signed)
Seeing cardiology. On metoprolol.  Elected not to anticoagulate.  Follow.  Overall she feels is relatively stable.

## 2018-05-29 ENCOUNTER — Encounter: Payer: Self-pay | Admitting: Internal Medicine

## 2018-06-11 ENCOUNTER — Ambulatory Visit (INDEPENDENT_AMBULATORY_CARE_PROVIDER_SITE_OTHER): Payer: BLUE CROSS/BLUE SHIELD | Admitting: Internal Medicine

## 2018-06-11 ENCOUNTER — Encounter: Payer: Self-pay | Admitting: Internal Medicine

## 2018-06-11 DIAGNOSIS — G4733 Obstructive sleep apnea (adult) (pediatric): Secondary | ICD-10-CM

## 2018-06-11 NOTE — Progress Notes (Signed)
[facility] Consultation     Virtual Visit via Telephone Note I connected with pt on 06/11/18 at  4:00 PM EDT by telephone and verified that I am speaking with the correct person using two identifiers.   I discussed the limitations, risks, security and privacy concerns of performing an evaluation and management service by telephone and the availability of in person appointments. I also discussed with the patient that there may be a patient responsible charge related to this service. The patient expressed understanding and agreed to proceed. I discussed the assessment and treatment plan with the patient. The patient was provided an opportunity to ask questions and all were answered. The patient agreed with the plan and demonstrated an understanding of the instructions. Please see note below for further detail.    The patient was advised to call back or seek an in-person evaluation if the symptoms worsen or if the condition fails to improve as anticipated.  I provided 11 minutes of non-face-to-face time during this encounter.   Laverle Hobby, MD   Assessment and Plan:  Obstructive Sleep Apnea - Severe OSA with AHI of 36. --Pt would like to repeat sleep study now that she has lost some weight, she is going to follow up with ENT for that test, and with Dr. Lanney Gins in pulmonary for her pulmonary issues. She can follow up with [name] on an as needed basis.   Essential hypertension, atrial fibrillation, obesity. - Obstructive sleep apnea can contribute to above conditions, therefore treatment of sleep apnea is an important part of their management.   Return as needed. You will be following up with ENT for sleep test and [facility] pulmonary. .   Date: 06/11/2018  MRN# [id number] BENNETT VANSCYOC 16-Aug-1965  Referring Physician: Einar Pheasant, M.D.   Joy Houston is a 53 y.o. old female seen in consultation for chief complaint of:    No chief complaint  on file.   HPI:  The patient is a 53 year old female referred for symptoms of excessive daytime sleepiness.  Her Epworth score is elevated at 15 today. Her husband is present and notes that she snores loudly, she falls asleep easily like in a movie theater. 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  This has been doing on for about 6 mo to a year and has been progressive worsening. She has gained about 40 lbs in past 3  Years.  She occasionally naps after getting home from work.   Since her last visit she has developed angioedema and has been admitted to the hospital therefore she has not able to use the CPAP as much as she could. She is not sure that the test was accurate, so she is going to get another sleep test done by her ENT doctor. She has lost about 20 pounds because of her illness and her husband ntoes that she is not snoring anymore.   **CPAP download 04/18/2018-05/17/2018>> usage greater than 4 hours 19/30 days.  Average usage on days used is 4 hours 17 minutes.  Pressure ranges 5-8.  Median pressure 7, 93rd percentile pressure 8.6 maximum pressure 8.7.  Leaks are within normal limits.  Residual AHI is 1.2. **HST/23/20>> AHI of 36 consistent with severe obstructive sleep apnea.  Social Hx:   Social History   Tobacco Use  . Smoking status: Never Smoker  . Smokeless tobacco: Never Used  Substance Use Topics  . Alcohol use: Yes    Alcohol/week: 0.0 standard drinks    Comment:  rare  . Drug use: No   Medication:    Current Outpatient Medications:  .  EPINEPHrine 0.3 mg/0.3 mL IJ SOAJ injection, , Disp: , Rfl:  .  metoprolol succinate (TOPROL-XL) 25 MG 24 hr tablet, Take 25 mg by mouth daily., Disp: , Rfl:  .  simethicone (MYLICON) 80 MG chewable tablet, Chew 1 tablet (80 mg total) by mouth every 6 (six) hours as needed for up to 30 days for flatulence., Disp: 30 tablet, Rfl: 0   Allergies:  Angiotensin receptor blockers; Lisinopril; Other; and Morphine and related  Review of Systems:   Constitutional: Feels well. Cardiovascular: Denies chest pain, exertional chest pain.  Pulmonary: Denies hemoptysis, pleuritic chest pain.   The remainder of systems were reviewed and were found to be negative other than what is documented in the HPI.       LABORATORY PANEL:   CBC No results for input(s): WBC, HGB, HCT, PLT in the last 168 hours. ------------------------------------------------------------------------------------------------------------------  Chemistries  No results for input(s): NA, K, CL, CO2, GLUCOSE, BUN, CREATININE, CALCIUM, MG, AST, ALT, ALKPHOS, BILITOT in the last 168 hours.  Invalid input(s): GFRCGP ------------------------------------------------------------------------------------------------------------------  Cardiac Enzymes No results for input(s): TROPONINI in the last 168 hours. ------------------------------------------------------------  RADIOLOGY:  No results found.     Thank  you for the consultation and for allowing [facility] to assist in the care of your patient. Our recommendations are noted above.  Please contact us if we can be of further service.   Marda Stalker, M.D., F.C.C.P.  Board Certified in Internal Medicine, Pulmonary Medicine, [place], and Sleep Medicine.  [facility] Office Number: [phone]   06/11/2018

## 2018-06-12 ENCOUNTER — Encounter: Payer: Self-pay | Admitting: Internal Medicine

## 2018-06-12 ENCOUNTER — Other Ambulatory Visit: Payer: Self-pay

## 2018-06-12 MED ORDER — PROBIOTIC DAILY PO CAPS: 1.0000 | ORAL_CAPSULE | Freq: Every day | ORAL | 1 refills | Status: DC

## 2018-06-12 MED ORDER — CULTURELLE DIGESTIVE HEALTH PO CAPS: 1.0000 | ORAL_CAPSULE | Freq: Every day | ORAL | 0 refills | Status: DC

## 2018-06-12 NOTE — Telephone Encounter (Signed)
Spoke with pt. Confirmed no new symptoms. Stools are becoming more formed. Cramps are better. I have sent in rx for generic probiotic to take q day. Patient is aware. Advised to let us know if symptoms persist

## 2018-06-25 DIAGNOSIS — G4733 Obstructive sleep apnea (adult) (pediatric): Secondary | ICD-10-CM | POA: Diagnosis not present

## 2018-06-27 DIAGNOSIS — Z1283 Encounter for screening for malignant neoplasm of skin: Secondary | ICD-10-CM | POA: Diagnosis not present

## 2018-06-27 DIAGNOSIS — D1800 Hemangioma unspecified site: Secondary | ICD-10-CM | POA: Diagnosis not present

## 2018-06-27 DIAGNOSIS — D239 Other benign neoplasm of skin, unspecified: Secondary | ICD-10-CM

## 2018-06-27 DIAGNOSIS — D485 Neoplasm of uncertain behavior of skin: Secondary | ICD-10-CM | POA: Diagnosis not present

## 2018-06-27 DIAGNOSIS — D225 Melanocytic nevi of trunk: Secondary | ICD-10-CM | POA: Diagnosis not present

## 2018-06-27 DIAGNOSIS — D229 Melanocytic nevi, unspecified: Secondary | ICD-10-CM | POA: Diagnosis not present

## 2018-06-27 DIAGNOSIS — L821 Other seborrheic keratosis: Secondary | ICD-10-CM | POA: Diagnosis not present

## 2018-06-27 DIAGNOSIS — N762 Acute vulvitis: Secondary | ICD-10-CM | POA: Diagnosis not present

## 2018-06-27 DIAGNOSIS — D2372 Other benign neoplasm of skin of left lower limb, including hip: Secondary | ICD-10-CM | POA: Diagnosis not present

## 2018-06-27 HISTORY — DX: Other benign neoplasm of skin, unspecified: D23.9

## 2018-07-02 ENCOUNTER — Encounter: Payer: Self-pay | Admitting: Internal Medicine

## 2018-07-06 ENCOUNTER — Ambulatory Visit: Payer: BLUE CROSS/BLUE SHIELD | Admitting: Internal Medicine

## 2018-07-20 ENCOUNTER — Ambulatory Visit: Payer: BC Managed Care – PPO | Attending: Otolaryngology

## 2018-07-20 DIAGNOSIS — G4733 Obstructive sleep apnea (adult) (pediatric): Secondary | ICD-10-CM | POA: Insufficient documentation

## 2018-07-23 ENCOUNTER — Other Ambulatory Visit: Payer: Self-pay

## 2018-07-25 DIAGNOSIS — R05 Cough: Secondary | ICD-10-CM | POA: Diagnosis not present

## 2018-07-26 DIAGNOSIS — R05 Cough: Secondary | ICD-10-CM | POA: Diagnosis not present

## 2018-07-26 DIAGNOSIS — G4733 Obstructive sleep apnea (adult) (pediatric): Secondary | ICD-10-CM | POA: Diagnosis not present

## 2018-07-27 ENCOUNTER — Ambulatory Visit (INDEPENDENT_AMBULATORY_CARE_PROVIDER_SITE_OTHER): Payer: BC Managed Care – PPO | Admitting: Internal Medicine

## 2018-07-27 ENCOUNTER — Encounter: Payer: Self-pay | Admitting: Internal Medicine

## 2018-07-27 DIAGNOSIS — E119 Type 2 diabetes mellitus without complications: Secondary | ICD-10-CM | POA: Diagnosis not present

## 2018-07-27 DIAGNOSIS — G4730 Sleep apnea, unspecified: Secondary | ICD-10-CM

## 2018-07-27 DIAGNOSIS — R945 Abnormal results of liver function studies: Secondary | ICD-10-CM | POA: Diagnosis not present

## 2018-07-27 DIAGNOSIS — E7800 Pure hypercholesterolemia, unspecified: Secondary | ICD-10-CM | POA: Diagnosis not present

## 2018-07-27 DIAGNOSIS — I4891 Unspecified atrial fibrillation: Secondary | ICD-10-CM | POA: Diagnosis not present

## 2018-07-27 DIAGNOSIS — I10 Essential (primary) hypertension: Secondary | ICD-10-CM

## 2018-07-27 NOTE — Progress Notes (Signed)
Patient ID: Joy Houston, female   DOB: 10/08/1965, 53 y.o.   MRN: [id number]   Virtual Visit via video Note  This visit type was conducted due to national recommendations for restrictions regarding the COVID-19 pandemic (e.g. social distancing).  This format is felt to be most appropriate for this patient at this time.  All issues noted in this document were discussed and addressed.  No physical exam was performed (except for noted visual exam findings with Video Visits).   I connected with Truman Hayward by a video enabled telemedicine application or telephone and verified that I am speaking with the correct person using two identifiers. Location patient: home Location provider: work  Persons participating in the virtual visit: patient, provider  I discussed the limitations, risks, security and privacy concerns of performing an evaluation and management service by video and the availability of in person appointments.  The patient expressed understanding and agreed to proceed.   Reason for visit: scheduled follow up.    HPI: She reports she is doing relatively well.  Feels better.  Had f/u with pulmonary 07/25/18.  Had f/u sleep study.  Mild sleep apnea.  Discussed diet and exercise.  She plans to start exercising more.  No chest pain.  Some intermittent episodes of afib.  Had longer episode recently.  On toprol.  Discussed having low dose of metoprolol to have to take if needed.  Breathing stable.  No acid reflux.  No abdominal pain.  Bowels moving.  She is walking some.     ROS: See pertinent positives and negatives per HPI.  Past Medical History:  Diagnosis Date  . Complication of anesthesia   . Eczema   . Family history of anesthesia complication    Mother - confusion  . Fatty liver   . Heart murmur    Slight - "nothing to worry about"  . Hemorrhoid   . Hypertension   . PONV (postoperative nausea and vomiting)     Past Surgical History:  Procedure Laterality Date  .  CESAREAN SECTION    . FINGER SURGERY     pin to 3rd finger Right hand  . LUMBAR LAMINECTOMY/DECOMPRESSION MICRODISCECTOMY  12/15/2011   Procedure: LUMBAR LAMINECTOMY/DECOMPRESSION MICRODISCECTOMY 1 LEVEL;  Surgeon: Ophelia Charter, MD;  Location: [place] [facility];  Service: Neurosurgery;  Laterality: Right;  RIGHT Lumbar four-Five diskectomy  . WISDOM TOOTH EXTRACTION      Family History  Problem Relation Age of Onset  . GER disease Mother   . Diabetes Father   . Cancer Father        Melanoma, Prostate  . Heart disease Father   . Heart disease Maternal Grandfather   . Diabetes Paternal Grandfather   . Breast cancer Sister 4    SOCIAL HX: reviewed.    Current Outpatient Medications:  .  EPINEPHrine 0.3 mg/0.3 mL IJ SOAJ injection, , Disp: , Rfl:  .  Lactobacillus-Inulin ([place]) CAPS, Take 1 capsule by mouth daily., Disp: 90 capsule, Rfl: 0 .  metoprolol succinate (TOPROL-XL) 25 MG 24 hr tablet, Take 25 mg by mouth daily., Disp: , Rfl:  .  metoprolol succinate (TOPROL-XL) 25 MG 24 hr tablet, 1/2 tablet q day prn, Disp: 30 tablet, Rfl: 1 .  simethicone (MYLICON) 80 MG chewable tablet, Chew 1 tablet (80 mg total) by mouth every 6 (six) hours as needed for up to 30 days for flatulence., Disp: 30 tablet, Rfl: 0  EXAM:  VITALS per patient if applicable: weight 097  pounds.   GENERAL: alert, oriented, appears well and in no acute distress  HEENT: atraumatic, conjunttiva clear, no obvious abnormalities on inspection of external nose and ears  NECK: normal movements of the head and neck  LUNGS: on inspection no signs of respiratory distress, breathing rate appears normal, no obvious gross SOB, gasping or wheezing  CV: no obvious cyanosis  PSYCH/NEURO: pleasant and cooperative, no obvious depression or anxiety, speech and thought processing grossly intact  ASSESSMENT AND PLAN:  Discussed the following assessment and plan:  Abnormal liver function - Plan:  Hepatic function panel  Atrial fibrillation, unspecified type ([place])  Diabetes mellitus without complication ([place]) - Plan: Hemoglobin Y5U, Basic metabolic panel  Hypercholesterolemia - Plan: Lipid panel  Essential hypertension - Plan: CBC with Differential/Platelet, TSH  Sleep apnea, unspecified type  Abnormal liver function Discussed diet and exercise.  Low carb diet.  Follow liver function tests.    A-fib [facility]) Seeing cardiology.  On toprol XL.  Still with some intermittent episodes of afib.  Discussed metoprolol to have to take prn.  Follow.  Continue f/u with cardiology.    Diabetes mellitus without complication (HCC) Low carb diet and exercise.  Follow met b and a1c.    Hypercholesterolemia Low cholesterol diet and exercise.  Follow lipid panel.   Hypertension Blood pressure doing well.  Follow pressures.  Follow metabolic panel.    Sleep apnea Had second sleep study.  Mild sleep apnea.  Evaluated by pulmonary.      I discussed the assessment and treatment plan with the patient. The patient was provided an opportunity to ask questions and all were answered. The patient agreed with the plan and demonstrated an understanding of the instructions.   The patient was advised to call back or seek an in-person evaluation if the symptoms worsen or if the condition fails to improve as anticipated.    Einar Pheasant, MD

## 2018-07-29 ENCOUNTER — Encounter: Payer: Self-pay | Admitting: Internal Medicine

## 2018-07-29 MED ORDER — METOPROLOL SUCCINATE ER 25 MG PO TB24: ORAL_TABLET | ORAL | 1 refills | Status: DC

## 2018-07-29 NOTE — Assessment & Plan Note (Signed)
Low carb diet and exercise.  Follow met b and a1c.   

## 2018-07-29 NOTE — Assessment & Plan Note (Signed)
Blood pressure doing well.  Follow pressures.  Follow metabolic panel.  

## 2018-07-29 NOTE — Assessment & Plan Note (Signed)
Had second sleep study.  Mild sleep apnea.  Evaluated by pulmonary.

## 2018-07-29 NOTE — Assessment & Plan Note (Signed)
Discussed diet and exercise.  Low carb diet.  Follow liver function tests.   

## 2018-07-29 NOTE — Assessment & Plan Note (Signed)
Seeing cardiology.  On toprol XL.  Still with some intermittent episodes of afib.  Discussed metoprolol to have to take prn.  Follow.  Continue f/u with cardiology.

## 2018-07-29 NOTE — Assessment & Plan Note (Signed)
Low cholesterol diet and exercise.  Follow lipid panel.   

## 2018-07-30 DIAGNOSIS — G4733 Obstructive sleep apnea (adult) (pediatric): Secondary | ICD-10-CM | POA: Diagnosis not present

## 2018-08-02 ENCOUNTER — Encounter: Payer: Self-pay | Admitting: Internal Medicine

## 2018-08-02 MED ORDER — METOPROLOL TARTRATE 25 MG PO TABS: ORAL_TABLET | ORAL | 1 refills | Status: DC

## 2018-08-02 NOTE — Telephone Encounter (Signed)
rx sent in for metoprolol 25mg  to take 1/2 daily prn.

## 2018-08-16 ENCOUNTER — Encounter: Payer: Self-pay | Admitting: Internal Medicine

## 2018-08-16 ENCOUNTER — Emergency Department: Payer: BC Managed Care – PPO

## 2018-08-16 ENCOUNTER — Encounter: Payer: Self-pay | Admitting: Intensive Care

## 2018-08-16 ENCOUNTER — Emergency Department
Admission: EM | Admit: 2018-08-16 | Discharge: 2018-08-16 | Disposition: A | Discharge status: Home / Self Care | Payer: BC Managed Care – PPO | Attending: Emergency Medicine | Admitting: Emergency Medicine

## 2018-08-16 ENCOUNTER — Other Ambulatory Visit: Payer: Self-pay

## 2018-08-16 ENCOUNTER — Ambulatory Visit (INDEPENDENT_AMBULATORY_CARE_PROVIDER_SITE_OTHER): Payer: BC Managed Care – PPO | Admitting: Internal Medicine

## 2018-08-16 ENCOUNTER — Ambulatory Visit: Payer: Self-pay

## 2018-08-16 DIAGNOSIS — K8020 Calculus of gallbladder without cholecystitis without obstruction: Secondary | ICD-10-CM | POA: Diagnosis not present

## 2018-08-16 DIAGNOSIS — Z79899 Other long term (current) drug therapy: Secondary | ICD-10-CM | POA: Diagnosis not present

## 2018-08-16 DIAGNOSIS — E119 Type 2 diabetes mellitus without complications: Secondary | ICD-10-CM | POA: Diagnosis not present

## 2018-08-16 DIAGNOSIS — R1011 Right upper quadrant pain: Secondary | ICD-10-CM

## 2018-08-16 DIAGNOSIS — K760 Fatty (change of) liver, not elsewhere classified: Secondary | ICD-10-CM | POA: Diagnosis not present

## 2018-08-16 DIAGNOSIS — R110 Nausea: Secondary | ICD-10-CM

## 2018-08-16 DIAGNOSIS — R0789 Other chest pain: Secondary | ICD-10-CM | POA: Diagnosis not present

## 2018-08-16 DIAGNOSIS — I10 Essential (primary) hypertension: Secondary | ICD-10-CM | POA: Insufficient documentation

## 2018-08-16 HISTORY — DX: Unspecified atrial fibrillation: I48.91

## 2018-08-16 LAB — URINALYSIS, COMPLETE (UACMP) WITH MICROSCOPIC
Bilirubin Urine: NEGATIVE
Glucose, UA: NEGATIVE mg/dL
Ketones, ur: 20 mg/dL — AB
Leukocytes,Ua: NEGATIVE
Nitrite: NEGATIVE
Protein, ur: NEGATIVE mg/dL
Specific Gravity, Urine: 1.025 (ref 1.005–1.030)
pH: 5 (ref 5.0–8.0)

## 2018-08-16 LAB — CBC
HCT: 46.9 % — ABNORMAL HIGH (ref 36.0–46.0)
Hemoglobin: 15.8 g/dL — ABNORMAL HIGH (ref 12.0–15.0)
MCH: 30.6 pg (ref 26.0–34.0)
MCHC: 33.7 g/dL (ref 30.0–36.0)
MCV: 90.7 fL (ref 80.0–100.0)
Platelets: 284 10*3/uL (ref 150–400)
RBC: 5.17 MIL/uL — ABNORMAL HIGH (ref 3.87–5.11)
RDW: 12.9 % (ref 11.5–15.5)
WBC: 11.5 10*3/uL — ABNORMAL HIGH (ref 4.0–10.5)
nRBC: 0 % (ref 0.0–0.2)

## 2018-08-16 LAB — COMPREHENSIVE METABOLIC PANEL
ALT: 41 U/L (ref 0–44)
AST: 35 U/L (ref 15–41)
Albumin: 4.8 g/dL (ref 3.5–5.0)
Alkaline Phosphatase: 70 U/L (ref 38–126)
Anion gap: 8 (ref 5–15)
BUN: 12 mg/dL (ref 6–20)
CO2: 26 mmol/L (ref 22–32)
Calcium: 9.6 mg/dL (ref 8.9–10.3)
Chloride: 104 mmol/L (ref 98–111)
Creatinine, Ser: 0.71 mg/dL (ref 0.44–1.00)
GFR calc Af Amer: >60 mL/min (ref >60)
GFR calc non Af Amer: >60 mL/min (ref >60)
Glucose, Bld: 111 mg/dL — ABNORMAL HIGH (ref 70–99)
Potassium: 3.9 mmol/L (ref 3.5–5.1)
Sodium: 138 mmol/L (ref 135–145)
Total Bilirubin: 0.9 mg/dL (ref 0.3–1.2)
Total Protein: 8.5 g/dL — ABNORMAL HIGH (ref 6.5–8.1)

## 2018-08-16 LAB — LIPASE, BLOOD: Lipase: 27 U/L (ref 11–51)

## 2018-08-16 NOTE — ED Notes (Signed)
Patient undressed from the waist up and placed in gown. NAD. Patient moves easily.

## 2018-08-16 NOTE — ED Triage Notes (Signed)
Patient c/o right upper quadrant pain with nausea. Worsening after eating. HX gallstones

## 2018-08-16 NOTE — Patient Instructions (Signed)

## 2018-08-16 NOTE — ED Provider Notes (Signed)
[facility] Emergency Department Provider Note   ____________________________________________    I have reviewed the triage vital signs and the nursing notes.   HISTORY  Chief Complaint Abdominal pain    HPI Joy Houston is a 53 y.o. female who presents with 2 to 3 days of right upper quadrant abdominal discomfort which seems to wax and wane in intensity, she describes it as an ache at this time so relatively mild.  She had mild nausea now resolved.  Brief episode of chills.  Recently went to the beach on vacation.  Does not seem to be pleuritic.  No calf pain or swelling no chest pain.  Has not take anything for this.  Does report a history of gallstones.  Past Medical History:  Diagnosis Date  . Atrial fibrillation ([place])   . Complication of anesthesia   . Eczema   . Family history of anesthesia complication    Mother - confusion  . Fatty liver   . Heart murmur    Slight - "nothing to worry about"  . Hemorrhoid   . Hypertension   . PONV (postoperative nausea and vomiting)     Patient Active Problem List   Diagnosis Date Noted  . Gallstones 08/16/2018  . Fatty liver 08/16/2018  . Sleep apnea 05/27/2018  . Diarrhea 05/27/2018  . History of angioedema 05/27/2018  . SOB (shortness of breath) 05/10/2018  . A-fib ([place]) 03/08/2018  . Snoring 03/08/2018  . Pedal edema 03/08/2018  . Tachycardia 01/14/2018  . Chest pain 11/19/2017  . Right knee pain 03/05/2017  . Abdominal fullness in right upper quadrant 01/13/2015  . Sebaceous cyst 09/13/2014  . Change in vision 05/06/2014  . Health care maintenance 05/06/2014  . BMI 40.0-44.9, adult ([place]) 09/05/2013  . Abnormal liver function 01/08/2012  . Hypercholesterolemia 01/08/2012  . Hematuria 01/08/2012  . Diabetes mellitus without complication ([place]) 53/29/9242  . Hypertension 01/04/2012  . Lumbar herniated disc 12/15/2011    Past Surgical History:  Procedure Laterality Date  .  CESAREAN SECTION    . FINGER SURGERY     pin to 3rd finger Right hand  . LUMBAR LAMINECTOMY/DECOMPRESSION MICRODISCECTOMY  12/15/2011   Procedure: LUMBAR LAMINECTOMY/DECOMPRESSION MICRODISCECTOMY 1 LEVEL;  Surgeon: Ophelia Charter, MD;  Location: [place] [facility];  Service: Neurosurgery;  Laterality: Right;  RIGHT Lumbar four-Five diskectomy  . WISDOM TOOTH EXTRACTION      Prior to Admission medications   Medication Sig Start Date End Date Taking? Authorizing Provider  EPINEPHrine 0.3 mg/0.3 mL IJ SOAJ injection  05/08/18   [provider]  Lactobacillus-Inulin ([place]) CAPS Take 1 capsule by mouth daily. 06/12/18   Einar Pheasant, MD  metoprolol succinate (TOPROL-XL) 25 MG 24 hr tablet Take 25 mg by mouth daily.    [provider]  metoprolol tartrate (LOPRESSOR) 25 MG tablet 1/2 tablet q day prn 08/02/18   Einar Pheasant, MD  simethicone (MYLICON) 80 MG chewable tablet Chew 1 tablet (80 mg total) by mouth every 6 (six) hours as needed for up to 30 days for flatulence. 05/10/18 06/09/18  Ripley Fraise, PA     Allergies Angiotensin receptor blockers, Lisinopril, Other, and Morphine and related  Family History  Problem Relation Age of Onset  . GER disease Mother   . Diabetes Father   . Cancer Father        Melanoma, Prostate  . Heart disease Father   . Heart disease Maternal Grandfather   . Diabetes Paternal  Grandfather   . Breast cancer Sister 74    Social History Social History   Tobacco Use  . Smoking status: Never Smoker  . Smokeless tobacco: Never Used  Substance Use Topics  . Alcohol use: Yes    Alcohol/week: 0.0 standard drinks    Comment: rare  . Drug use: No    Review of Systems  Constitutional: No fever/chills Eyes: No visual changes.  ENT: No sore throat. Cardiovascular: Denies chest pain. Respiratory: Denies shortness of breath.  No pleurisy Gastrointestinal: As above Genitourinary: Negative for dysuria. Musculoskeletal:  Negative for back pain. Skin: Negative for rash. Neurological: Negative for headaches   ____________________________________________   PHYSICAL EXAM:  VITAL SIGNS: ED Triage Vitals  Enc Vitals Group     BP 08/16/18 1514 (!) 164/68     Pulse Rate 08/16/18 1514 80     Resp 08/16/18 1514 16     Temp 08/16/18 1354 98.2 F (36.8 C)     Temp Source 08/16/18 1354 Oral     SpO2 08/16/18 1514 96 %     Weight 08/16/18 1355 108.9 kg (240 lb)     Height 08/16/18 1355 1.664 m (5' 5.5")     Head Circumference --      Peak Flow --      Pain Score 08/16/18 1354 2     Pain Loc --      Pain Edu? --      Excl. in [place]? --     Constitutional: Alert and oriented. No acute distress. Pleasant and interactive   Mouth/Throat: Mucous membranes are moist.    Cardiovascular: Normal rate, regular rhythm. Grossly normal heart sounds.  Good peripheral circulation. Respiratory: Normal respiratory effort.  No retractions. Lungs CTAB. Gastrointestinal: Soft and nontender. No distention.  No CVA tenderness.  Extremity exam  Musculoskeletal: No lower extremity tenderness nor edema.  Warm and well perfused Neurologic:  Normal speech and language. No gross focal neurologic deficits are appreciated.  Skin:  Skin is warm, dry and intact. No rash noted. Psychiatric: Mood and affect are normal. Speech and behavior are normal.  ____________________________________________   LABS (all labs ordered are listed, but only abnormal results are displayed)  Labs Reviewed  COMPREHENSIVE METABOLIC PANEL - Abnormal; Notable for the following components:      Result Value   Glucose, Bld 111 (*)    Total Protein 8.5 (*)    All other components within normal limits  CBC - Abnormal; Notable for the following components:   WBC 11.5 (*)    RBC 5.17 (*)    Hemoglobin 15.8 (*)    HCT 46.9 (*)    All other components within normal limits  URINALYSIS, COMPLETE (UACMP) WITH MICROSCOPIC - Abnormal; Notable for the  following components:   Color, Urine YELLOW (*)    APPearance HAZY (*)    Hgb urine dipstick SMALL (*)    Ketones, ur 20 (*)    Bacteria, UA RARE (*)    All other components within normal limits  LIPASE, BLOOD   ____________________________________________  EKG  ED ECG REPORT I, Lavonia Drafts, the attending physician, personally viewed and interpreted this ECG.  Date: 08/16/2018  Rhythm: normal sinus rhythm QRS Axis: normal Intervals: normal ST/T Wave abnormalities: Nonspecific changes Narrative Interpretation: no evidence of acute ischemia  ____________________________________________  RADIOLOGY  Ultrasound demonstrates cholelithiasis, no evidence of cholecystitis, patient aware of fatty liver ____________________________________________   PROCEDURES  Procedure(s) performed: No  Procedures   Critical Care performed: No ____________________________________________  INITIAL IMPRESSION / ASSESSMENT AND PLAN / ED COURSE  Pertinent labs & imaging results that were available during my care of the patient were reviewed by me and considered in my medical decision making (see chart for details).  Patient well-appearing in no acute distress, vital signs are unremarkable, she is afebrile.  She describes pain in her right upper quadrant of her abdomen, she does have cholelithiasis on ultrasound and this may be biliary colic that she is describing, lab work is overall reassuring.  Given location and possibility of a pneumonia causing discomfort there is prompted obtaining a chest x-ray given her description of chills.  However no shortness of breath or pleurisy or chest pain to suggest PE.  If chest x-ray unremarkable, will recommend outpatient follow-up    ____________________________________________   FINAL CLINICAL IMPRESSION(S) / ED DIAGNOSES  Final diagnoses:  Right upper quadrant abdominal pain        Note:  This document was prepared using Dragon voice  recognition software and may include unintentional dictation errors.   Lavonia Drafts, MD 08/16/18 [phone]

## 2018-08-16 NOTE — Progress Notes (Signed)
Telephone Note  I connected with Joy Houston   on 08/16/18 at  1:00 PM EDT by telephone application and verified that I am speaking with the correct person using two identifiers.  Location patient: car Location provider:work  Persons participating in the virtual visit: patient, provider, pts husband  I discussed the limitations of evaluation and management by telemedicine and the availability of in person appointments. The patient expressed understanding and agreed to proceed.   HPI: Joy Houston 01/21/15 multiple gallstones fatty liver c/o Sunday/monday pain in RUQ "rib cage" in area of liver/gallbladder worsening since Monday severe right upper quadrant abdominal pain. Tuesday she was able to eat protein shake and cherries and weds she had 2 eggs and ice cream which made her abdominal pain worse and she has felt nauseated but no vomiting. She has felt intermittent chills unsure if she has a fever. She has had belching as well. Pain is better when she stands but last night she only had 4 hrs of sleep 2/2 pain from 2 am to 6 am she slept but woke up due to pain  She has been at the beach out of town but 30 minutes away in [place] in the car with her husband    ROS: See pertinent positives and negatives per HPI.  Past Medical History:  Diagnosis Date  . Complication of anesthesia   . Eczema   . Family history of anesthesia complication    Mother - confusion  . Fatty liver   . Heart murmur    Slight - "nothing to worry about"  . Hemorrhoid   . Hypertension   . PONV (postoperative nausea and vomiting)     Past Surgical History:  Procedure Laterality Date  . CESAREAN SECTION    . FINGER SURGERY     pin to 3rd finger Right hand  . LUMBAR LAMINECTOMY/DECOMPRESSION MICRODISCECTOMY  12/15/2011   Procedure: LUMBAR LAMINECTOMY/DECOMPRESSION MICRODISCECTOMY 1 LEVEL;  Surgeon: Ophelia Charter, MD;  Location: [place] [facility];  Service: Neurosurgery;  Laterality: Right;  RIGHT Lumbar four-Five  diskectomy  . WISDOM TOOTH EXTRACTION      Family History  Problem Relation Age of Onset  . GER disease Mother   . Diabetes Father   . Cancer Father        Melanoma, Prostate  . Heart disease Father   . Heart disease Maternal Grandfather   . Diabetes Paternal Grandfather   . Breast cancer Sister 22    SOCIAL HX: married    Current Outpatient Medications:  .  EPINEPHrine 0.3 mg/0.3 mL IJ SOAJ injection, , Disp: , Rfl:  .  metoprolol succinate (TOPROL-XL) 25 MG 24 hr tablet, Take 25 mg by mouth daily., Disp: , Rfl:  .  metoprolol tartrate (LOPRESSOR) 25 MG tablet, 1/2 tablet q day prn, Disp: 30 tablet, Rfl: 1 .  Lactobacillus-Inulin ([place]) CAPS, Take 1 capsule by mouth daily., Disp: 90 capsule, Rfl: 0 .  simethicone (MYLICON) 80 MG chewable tablet, Chew 1 tablet (80 mg total) by mouth every 6 (six) hours as needed for up to 30 days for flatulence., Disp: 30 tablet, Rfl: 0  EXAM:  VITALS per patient if applicable:  GENERAL: alert, oriented, appears well and in no acute distress  PSYCH/NEURO: pleasant and cooperative, no obvious depression or anxiety, speech and thought processing grossly intact  ASSESSMENT AND PLAN:  Discussed the following assessment and plan:  Gallstones c/w acute cholecystitis RUQ abdominal pain  Nausea  -rec she [facility] to [facility] ED  further w/u and for tx  rec CMET, CBC, UA, stat Joy Houston abdomen, consider nausea and pain control and possibly antibiotics I.e cipro with her h/o chills ? If she is septic     I discussed the assessment and treatment plan with the patient. The patient was provided an opportunity to ask questions and all were answered. The patient agreed with the plan and demonstrated an understanding of the instructions.   The patient was advised to call back or seek an in-person evaluation if the symptoms worsen or if the condition fails to improve as anticipated.  Time spent 15 minutes  Delorise Jackson, MD

## 2018-08-16 NOTE — Telephone Encounter (Signed)
Patient called and says she has a fullness feeling under her right rib that has progressively gotten worse over the past week. She says there is no pain, just a full uncomfortable feeling that is worse when sitting and better when standing. She says she was nauseated this morning, but it has passed. She says she's been having some difficulty breathing, described it as having to get her breath, but says it's ok at the moment. She says other symptoms she has are chills and belching a lot. I called the office and spoke to [place], [facility] who asked to speak to the patient, the call was connected successfully.  Answer Assessment - Initial Assessment Questions 1. LOCATION: "Where does it hurt?"      Feeling of fullness to the right side under the rib 2. RADIATION: "Does the pain shoot anywhere else?" (e.g., chest, back)     No 3. ONSET: "When did the pain begin?" (e.g., minutes, hours or days ago)       Bothering me all week, feel the pressure when sitting down 4. SUDDEN: "Gradual or sudden onset?"     Gradually worse 5. PATTERN "Does the pain come and go, or is it constant?"    - If constant: "Is it getting better, staying the same, or worsening?"      (Note: Constant means the pain never goes away completely; most serious pain is constant and it progresses)     - If intermittent: "How long does it last?" "Do you have pain now?"     (Note: Intermittent means the pain goes away completely between bouts)     Constant for about a week 6. SEVERITY: "How bad is the pain?"  (e.g., Scale 1-10; mild, moderate, or severe)    - MILD (1-3): doesn't interfere with normal activities, abdomen soft and not tender to touch     - MODERATE (4-7): interferes with normal activities or awakens from sleep, tender to touch     - SEVERE (8-10): excruciating pain, doubled over, unable to do any normal activities       Not really painful, but it's uncomfortable 7. RECURRENT SYMPTOM: "Have you ever had this type of abdominal  pain before?" If so, ask: "When was the last time?" and "What happened that time?"      Yes, several years ago 8. AGGRAVATING FACTORS: "Does anything seem to cause this pain?" (e.g., foods, stress, alcohol)     Standing makes it better; sitting makes it worse 9. CARDIAC SYMPTOMS: "Do you have any of the following symptoms: chest pain, difficulty breathing, sweating, nausea?"     Nausea this morning; intermittent difficulty breathing, feels like have to get breath, but ok at the moment 10. OTHER SYMPTOMS: "Do you have any other symptoms?" (e.g., fever, vomiting, diarrhea)       Nausea earlier but not now, chills, burping a lot  11. PREGNANCY: "Is there any chance you are pregnant?" "When was your last menstrual period?"       No  Protocols used: ABDOMINAL PAIN - UPPER-A-AH

## 2018-08-17 ENCOUNTER — Encounter: Payer: Self-pay | Admitting: Internal Medicine

## 2018-08-17 DIAGNOSIS — K8020 Calculus of gallbladder without cholecystitis without obstruction: Secondary | ICD-10-CM

## 2018-08-18 NOTE — Telephone Encounter (Signed)
Order placed for surgery referral to Dr Ferrel Logan

## 2018-08-23 ENCOUNTER — Ambulatory Visit: Payer: Self-pay | Admitting: General Surgery

## 2018-08-23 DIAGNOSIS — G4733 Obstructive sleep apnea (adult) (pediatric): Secondary | ICD-10-CM | POA: Diagnosis not present

## 2018-08-23 DIAGNOSIS — I480 Paroxysmal atrial fibrillation: Secondary | ICD-10-CM | POA: Diagnosis not present

## 2018-08-23 DIAGNOSIS — K8020 Calculus of gallbladder without cholecystitis without obstruction: Secondary | ICD-10-CM | POA: Diagnosis not present

## 2018-08-23 NOTE — H&P (Signed)
PATIENT PROFILE: Joy Houston is a 53 y.o. female who presents to the Clinic for consultation at the request of Dr. Nicki Reaper for evaluation of cholelithiasis.  PCP:  Alisa Graff, MD  HISTORY OF PRESENT ILLNESS: Joy Houston reports having right upper quadrant pain since last week.  She reports that she has had history of cholelithiasis diagnosed 4 years ago due to a similar pain.  She reports that the pain is described as pressure in the right upper quadrant.  It was exacerbated after eating "heavy meals" during vacation.  Pain localized to the right upper quadrant.  There is no pain radiation.  Pain exacerbated by oral intake.  There is no alleviating factor.  Patient also described having chronic issues with diarrhea.  This has not been related to the current pain.  Patient denies fever chills.  Patient went to the ED 4 days ago due to this pain.  She was found again with cholelithiasis but no gallbladder wall thickening or pericholecystic fluid on ultrasound.  I personally evaluated the ultrasound.   PROBLEM LIST:         Problem List  Date Reviewed: 07/25/2018         Noted   Paroxysmal A-fib (CMS-HCC) 03/01/2018   OSA (obstructive sleep apnea) 03/01/2018   Palpitations 01/17/2018   Benign essential HTN 01/17/2018   Hyperlipidemia, mixed 01/17/2018      GENERAL REVIEW OF SYSTEMS:   General ROS: negative for - chills, fatigue, fever, weight gain or weight loss Allergy and Immunology ROS: negative for - hives  Hematological and Lymphatic ROS: negative for - bleeding problems or bruising, negative for palpable nodes Endocrine ROS: Positive for cold intolerance, negative for hair changes Respiratory ROS: negative for - cough, positive for shortness of breath  Cardiovascular ROS: no chest pain.  Positive for palpitations GI ROS: negative for nausea, vomiting, constipation.  Stated for abdominal pain and diarrhea Musculoskeletal ROS: negative for - joint swelling or muscle  pain Neurological ROS: negative for - confusion, syncope Dermatological ROS: negative for pruritus and rash Psychiatric: negative for anxiety, depression, difficulty sleeping and memory loss  MEDICATIONS: CurrentMedications        Current Outpatient Medications  Medication Sig Dispense Refill  . metoprolol succinate (TOPROL-XL) 25 MG XL tablet Take 1 tablet (25 mg total) by mouth once daily 90 tablet 4   No current facility-administered medications for this visit.       ALLERGIES: Arb-angiotensin receptor antagonist; Lisinopril; and Morphine  PAST MEDICAL HISTORY:     Past Medical History:  Diagnosis Date  . Atrial fibrillation (CMS-HCC)   . Diabetes mellitus without complication (CMS-HCC)   . Hyperlipidemia   . Hypertension     PAST SURGICAL HISTORY:      Past Surgical History:  Procedure Laterality Date  . COLONOSCOPY  04/17/2003   Int Hemorrhoids, Diverticulosis  . COLONOSCOPY  12/16/2010   Adenomatous Polyp: CBF 11/2015; Recall Ltr mailed 10/06/2015 (dw)     FAMILY HISTORY: No family history on file.   SOCIAL HISTORY: Social History          Socioeconomic History  . Marital status: Married    Spouse name: Not on file  . Number of children: Not on file  . Years of education: Not on file  . Highest education level: Not on file  Occupational History  . Not on file  Social Needs  . Financial resource strain: Not on file  . Food insecurity:    Worry: Not on file  Inability: Not on file  . Transportation needs:    Medical: Not on file    Non-medical: Not on file  Tobacco Use  . Smoking status: Never Smoker  . Smokeless tobacco: Never Used  Substance and Sexual Activity  . Alcohol use: Not on file  . Drug use: Not on file  . Sexual activity: Not on file  Other Topics Concern  . Not on file  Social History Narrative  . Not on file      PHYSICAL EXAM:    Vitals:   08/23/18 1016  BP: (!) 122/90  Pulse: 72    Body mass index is 40.77 kg/m. Weight: (!) 111.1 kg (245 lb)   GENERAL: Alert, active, oriented x3  HEENT: Pupils equal reactive to light. Extraocular movements are intact. Sclera clear. Palpebral conjunctiva normal red color.Pharynx clear.  NECK: Supple with no palpable mass and no adenopathy.  LUNGS: Sound clear with no rales rhonchi or wheezes.  HEART: Regular rhythm S1 and S2 without murmur.  ABDOMEN: Soft and depressible, nontender with no palpable mass, no hepatomegaly.   EXTREMITIES: Well-developed well-nourished symmetrical with no dependent edema.  NEUROLOGICAL: Awake alert oriented, facial expression symmetrical, moving all extremities.  REVIEW OF DATA: I have reviewed the following data today:      Office Visit on 07/25/2018  Component Date Value  . SARS-CoV-2 Antibody, IgA* 07/26/2018 Positive   . SARS-CoV-2 Antibody, IgM* 07/26/2018 Negative   . SARS-CoV-2 Antibody IgG * 07/26/2018 Negative   . A.Fumigatus #1 Abs - Lab* 07/26/2018 Negative   . Micropolyspora faeni, Ig* 07/26/2018 Negative   . Thermoactinomyces vulgar* 07/26/2018 Negative   . A. Pullulans Abs - [facility]* 07/26/2018 Negative   . Thermoact. Saccharii - L* 07/26/2018 Negative   . Pigeon Serum Abs - [facility]* 07/26/2018 Negative      ASSESSMENT: Joy Houston is a 53 y.o. female presenting for consultation for cholelithiasis / biliary dyskinesia.    Patient was oriented about the diagnosis of cholelithiasis. Also oriented about what is the gallbladder, its anatomy and function and the implications of having stones. The patient was oriented about the treatment alternatives (observation vs cholecystectomy). Patient was oriented that a low percentage of patient will continue to have similar pain symptoms even after the gallbladder is removed. Surgical technique (open vs laparoscopic) was discussed. It was also discussed the goals of the surgery (decrease the pain episodes and avoid the risk of  cholecystitis) and the risk of surgery including: bleeding, infection, common bile duct injury, stone retention, injury to other organs such as bowel, liver, stomach, other complications such as hernia, bowel obstruction among others. Also discussed with patient about anesthesia and its complications such as: reaction to medications, pneumonia, heart complications, death, among others.  Patient is also having other medical issues such as atrial fibrillation which she is to be controlled with beta-blockers by her cardiologist.  She is not on any anticoagulation.  She does not have chest pain or shortness of breath on exertion the last few weeks.  She was being evaluated by pulmonologist but there has been no complication from this shortness of breath.  I had a long discussion with the patient and the husband about the goals of the surgery.  My goal of the surgery is to improve her right upper quadrant pain and pressure.  Other issues such as diarrhea and burping can change but usually are not related to the biliary colic's.  They understand that if diarrhea get worse and other symptoms that she  described as digestive symptoms does not change or does not improve she will need to see GI for further evaluation.  I also told her that her atrial fibrillation's and other medical conditions will not change significantly after the surgery.  This definitely make the surgery later be more risky due to her past medical history of cardiac disease.  At this moment I think the benefits of the surgery are better than the risk of the surgery.  She started to have more complaints from medical conditions and conditions such as A. fib that is under control at this moment can get worse in the future and even needing anticoagulation that will make the surgery more risky in the future.  They understand these and agreed to proceed with surgery.  Cholelithiasis without cholecystitis [K80.20]  PLAN: 1. Laparoscopic  cholecystectomy (50388) 2. CBC, CMP done 3. Cardiology clearance 4. Avoid taking aspirin 5 days before surgery 5. Contact us if has any question or concern.   Patient and her husband verbalized understanding, all questions were answered, and were agreeable with the plan outlined above.   This was an 80-minute encounter most of the time counseling the patient and answering all their question and coordinating plan of care.  I also discussed about surgery recovery and the goals of surgery.  Herbert Pun, MD  Electronically signed by Herbert Pun, MD

## 2018-08-23 NOTE — H&P (View-Only) (Signed)
PATIENT PROFILE: Joy Houston is a 53 y.o. female who presents to the Clinic for consultation at the request of Dr. Nicki Houston for evaluation of cholelithiasis.  PCP:  Joy Graff, MD  HISTORY OF PRESENT ILLNESS: Joy Houston reports having right upper quadrant pain since last week.  She reports that she has had history of cholelithiasis diagnosed 4 years ago due to a similar pain.  She reports that the pain is described as pressure in the right upper quadrant.  It was exacerbated after eating "heavy meals" during vacation.  Pain localized to the right upper quadrant.  There is no pain radiation.  Pain exacerbated by oral intake.  There is no alleviating factor.  Patient also described having chronic issues with diarrhea.  This has not been related to the current pain.  Patient denies fever chills.  Patient went to the ED 4 days ago due to this pain.  She was found again with cholelithiasis but no gallbladder wall thickening or pericholecystic fluid on ultrasound.  I personally evaluated the ultrasound.   PROBLEM LIST:         Problem List  Date Reviewed: 07/25/2018         Noted   Paroxysmal A-fib (CMS-HCC) 03/01/2018   OSA (obstructive sleep apnea) 03/01/2018   Palpitations 01/17/2018   Benign essential HTN 01/17/2018   Hyperlipidemia, mixed 01/17/2018      GENERAL REVIEW OF SYSTEMS:   General ROS: negative for - chills, fatigue, fever, weight gain or weight loss Allergy and Immunology ROS: negative for - hives  Hematological and Lymphatic ROS: negative for - bleeding problems or bruising, negative for palpable nodes Endocrine ROS: Positive for cold intolerance, negative for hair changes Respiratory ROS: negative for - cough, positive for shortness of breath  Cardiovascular ROS: no chest pain.  Positive for palpitations GI ROS: negative for nausea, vomiting, constipation.  Stated for abdominal pain and diarrhea Musculoskeletal ROS: negative for - joint swelling or muscle  pain Neurological ROS: negative for - confusion, syncope Dermatological ROS: negative for pruritus and rash Psychiatric: negative for anxiety, depression, difficulty sleeping and memory loss  MEDICATIONS: CurrentMedications        Current Outpatient Medications  Medication Sig Dispense Refill  . metoprolol succinate (TOPROL-XL) 25 MG XL tablet Take 1 tablet (25 mg total) by mouth once daily 90 tablet 4   No current facility-administered medications for this visit.       ALLERGIES: Arb-angiotensin receptor antagonist; Lisinopril; and Morphine  PAST MEDICAL HISTORY:     Past Medical History:  Diagnosis Date  . Atrial fibrillation (CMS-HCC)   . Diabetes mellitus without complication (CMS-HCC)   . Hyperlipidemia   . Hypertension     PAST SURGICAL HISTORY:      Past Surgical History:  Procedure Laterality Date  . COLONOSCOPY  04/17/2003   Int Hemorrhoids, Diverticulosis  . COLONOSCOPY  12/16/2010   Adenomatous Polyp: CBF 11/2015; Recall Ltr mailed 10/06/2015 (dw)     FAMILY HISTORY: No family history on file.   SOCIAL HISTORY: Social History          Socioeconomic History  . Marital status: Married    Spouse name: Not on file  . Number of children: Not on file  . Years of education: Not on file  . Highest education level: Not on file  Occupational History  . Not on file  Social Needs  . Financial resource strain: Not on file  . Food insecurity:    Worry: Not on file  Inability: Not on file  . Transportation needs:    Medical: Not on file    Non-medical: Not on file  Tobacco Use  . Smoking status: Never Smoker  . Smokeless tobacco: Never Used  Substance and Sexual Activity  . Alcohol use: Not on file  . Drug use: Not on file  . Sexual activity: Not on file  Other Topics Concern  . Not on file  Social History Narrative  . Not on file      PHYSICAL EXAM:    Vitals:   08/23/18 1016  BP: (!) 122/90  Pulse: 72    Body mass index is 40.77 kg/m. Weight: (!) 111.1 kg (245 lb)   GENERAL: Alert, active, oriented x3  HEENT: Pupils equal reactive to light. Extraocular movements are intact. Sclera clear. Palpebral conjunctiva normal red color.Pharynx clear.  NECK: Supple with no palpable mass and no adenopathy.  LUNGS: Sound clear with no rales rhonchi or wheezes.  HEART: Regular rhythm S1 and S2 without murmur.  ABDOMEN: Soft and depressible, nontender with no palpable mass, no hepatomegaly.   EXTREMITIES: Well-developed well-nourished symmetrical with no dependent edema.  NEUROLOGICAL: Awake alert oriented, facial expression symmetrical, moving all extremities.  REVIEW OF DATA: I have reviewed the following data today:      Office Visit on 07/25/2018  Component Date Value  . SARS-CoV-2 Antibody, IgA* 07/26/2018 Positive   . SARS-CoV-2 Antibody, IgM* 07/26/2018 Negative   . SARS-CoV-2 Antibody IgG * 07/26/2018 Negative   . A.Fumigatus #1 Abs - Lab* 07/26/2018 Negative   . Micropolyspora faeni, Ig* 07/26/2018 Negative   . Thermoactinomyces vulgar* 07/26/2018 Negative   . A. Pullulans Abs - [facility]* 07/26/2018 Negative   . Thermoact. Saccharii - L* 07/26/2018 Negative   . Pigeon Serum Abs - [facility]* 07/26/2018 Negative      ASSESSMENT: Joy Houston is a 53 y.o. female presenting for consultation for cholelithiasis / biliary dyskinesia.    Patient was oriented about the diagnosis of cholelithiasis. Also oriented about what is the gallbladder, its anatomy and function and the implications of having stones. The patient was oriented about the treatment alternatives (observation vs cholecystectomy). Patient was oriented that a low percentage of patient will continue to have similar pain symptoms even after the gallbladder is removed. Surgical technique (open vs laparoscopic) was discussed. It was also discussed the goals of the surgery (decrease the pain episodes and avoid the risk of  cholecystitis) and the risk of surgery including: bleeding, infection, common bile duct injury, stone retention, injury to other organs such as bowel, liver, stomach, other complications such as hernia, bowel obstruction among others. Also discussed with patient about anesthesia and its complications such as: reaction to medications, pneumonia, heart complications, death, among others.  Patient is also having other medical issues such as atrial fibrillation which she is to be controlled with beta-blockers by her cardiologist.  She is not on any anticoagulation.  She does not have chest pain or shortness of breath on exertion the last few weeks.  She was being evaluated by pulmonologist but there has been no complication from this shortness of breath.  I had a long discussion with the patient and the husband about the goals of the surgery.  My goal of the surgery is to improve her right upper quadrant pain and pressure.  Other issues such as diarrhea and burping can change but usually are not related to the biliary colic's.  They understand that if diarrhea get worse and other symptoms that she  described as digestive symptoms does not change or does not improve she will need to see GI for further evaluation.  I also told her that her atrial fibrillation's and other medical conditions will not change significantly after the surgery.  This definitely make the surgery later be more risky due to her past medical history of cardiac disease.  At this moment I think the benefits of the surgery are better than the risk of the surgery.  She started to have more complaints from medical conditions and conditions such as A. fib that is under control at this moment can get worse in the future and even needing anticoagulation that will make the surgery more risky in the future.  They understand these and agreed to proceed with surgery.  Cholelithiasis without cholecystitis [K80.20]  PLAN: 1. Laparoscopic  cholecystectomy (59977) 2. CBC, CMP done 3. Cardiology clearance 4. Avoid taking aspirin 5 days before surgery 5. Contact us if has any question or concern.   Patient and her husband verbalized understanding, all questions were answered, and were agreeable with the plan outlined above.   This was an 80-minute encounter most of the time counseling the patient and answering all their question and coordinating plan of care.  I also discussed about surgery recovery and the goals of surgery.  Herbert Pun, MD  Electronically signed by Herbert Pun, MD

## 2018-08-25 DIAGNOSIS — G4733 Obstructive sleep apnea (adult) (pediatric): Secondary | ICD-10-CM | POA: Diagnosis not present

## 2018-08-31 ENCOUNTER — Other Ambulatory Visit: Payer: Self-pay | Admitting: Internal Medicine

## 2018-09-03 ENCOUNTER — Encounter
Admission: RE | Admit: 2018-09-03 | Discharge: 2018-09-03 | Disposition: A | Discharge status: Home / Self Care | Payer: BC Managed Care – PPO | Source: Ambulatory Visit | Attending: General Surgery | Admitting: General Surgery

## 2018-09-03 ENCOUNTER — Other Ambulatory Visit: Payer: Self-pay

## 2018-09-03 DIAGNOSIS — I480 Paroxysmal atrial fibrillation: Secondary | ICD-10-CM | POA: Diagnosis not present

## 2018-09-03 DIAGNOSIS — R002 Palpitations: Secondary | ICD-10-CM | POA: Diagnosis not present

## 2018-09-03 DIAGNOSIS — G4733 Obstructive sleep apnea (adult) (pediatric): Secondary | ICD-10-CM | POA: Diagnosis not present

## 2018-09-03 DIAGNOSIS — E782 Mixed hyperlipidemia: Secondary | ICD-10-CM | POA: Diagnosis not present

## 2018-09-03 HISTORY — DX: Anemia, unspecified: D64.9

## 2018-09-03 HISTORY — DX: Type 2 diabetes mellitus without complications: E11.9

## 2018-09-03 HISTORY — DX: Sleep apnea, unspecified: G47.30

## 2018-09-03 NOTE — Pre-Procedure Instructions (Signed)
Anesthesia - patient has real concerns everyone is aware she does not receive any ace or arb's for blood pressure before during or after surgery.

## 2018-09-03 NOTE — Patient Instructions (Addendum)
Your procedure is scheduled on: Fri 09/07/18 Report to [facility]. To find out your arrival time please call [phone] between 1PM - 3PM on Thur 09/06/18.  Remember: Instructions that are not followed completely may result in serious medical risk, up to and including death, or upon the discretion of your surgeon and anesthesiologist your surgery may need to be rescheduled.     _X__ 1. Do not eat food after midnight the night before your procedure.                 No gum chewing or hard candies. You may drink clear liquids up to 2 hours                 before you are scheduled to arrive for your surgery- DO not drink clear                 liquids within 2 hours of the start of your surgery.                 Clear Liquids include:  water, apple juice without pulp, clear carbohydrate                 drink such as Clearfast or Gatorade, Black Coffee or Tea (Do not add                 anything to coffee or tea). Diabetics water only  __X__2.  On the morning of surgery brush your teeth with toothpaste and water, you                 may rinse your mouth with mouthwash if you wish.  Do not swallow any              toothpaste of mouthwash.     _X__ 3.  No Alcohol for 24 hours before or after surgery.   _X__ 4.  Do Not Smoke or use e-cigarettes For 24 Hours Prior to Your Surgery.                 Do not use any chewable tobacco products for at least 6 hours prior to                 surgery.  ____  5.  Bring all medications with you on the day of surgery if instructed.   __X__  6.  Notify your doctor if there is any change in your medical condition      (cold, fever, infections).     Do not wear jewelry, make-up, hairpins, clips or nail polish. Do not wear lotions, powders, or perfumes.  Do not shave 48 hours prior to surgery. Men may shave face and neck. Do not bring valuables to the hospital.    [facility] is not responsible for any  belongings or valuables.  Contacts, dentures/partials or body piercings may not be worn into surgery. Bring a case for your contacts, glasses or hearing aids, a denture cup will be supplied. Leave your suitcase in the car. After surgery it may be brought to your room. For patients admitted to the hospital, discharge time is determined by your treatment team.   Patients discharged the day of surgery will not be allowed to drive home.   Please read over the following fact sheets that you were given:   MRSA Information  __X__ Take these medicines the morning of surgery with A SIP OF WATER:  1.   2.   3.   4.  5.  6.  ____ Fleet Enema (as directed)   __ __ Use CHG Soap/SAGE wipes as directed  ____ Use inhalers on the day of surgery  ____ Stop metformin/Janumet/Farxiga 2 days prior to surgery    ____ Take 1/2 of usual insulin dose the night before surgery. No insulin the morning          of surgery.   ____ Stop Blood Thinners Coumadin/Plavix/Xarelto/Pleta/Pradaxa/Eliquis/Effient/Aspirin  on   Or contact your Surgeon, Cardiologist or Medical Doctor regarding  ability to stop your blood thinners  __X__ Stop Anti-inflammatories 7 days before surgery such as Advil, Ibuprofen, Motrin,  BC or Goodies Powder, Naprosyn, Naproxen, Aleve, Aspirin    __X__ Stop all herbal supplements, fish oil or vitamin E until after surgery.    ____ Bring C-Pap to the hospital.    Telephone interview with patient. Instructions provided. Patient verbalized understanding.

## 2018-09-03 NOTE — Pre-Procedure Instructions (Signed)
Written interpretation of ED EKG  EKG  ED ECG REPORT I, Lavonia Drafts, the attending physician, personally viewed and interpreted this ECG.  Date: 08/16/2018  Rhythm: normal sinus rhythm QRS Axis: normal Intervals: normal ST/T Wave abnormalities: Nonspecific changes Narrative Interpretation: no evidence of acute ischemia

## 2018-09-04 ENCOUNTER — Other Ambulatory Visit
Admission: RE | Admit: 2018-09-04 | Discharge: 2018-09-04 | Disposition: A | Discharge status: Home / Self Care | Payer: BC Managed Care – PPO | Source: Ambulatory Visit | Attending: General Surgery | Admitting: General Surgery

## 2018-09-04 DIAGNOSIS — Z1159 Encounter for screening for other viral diseases: Secondary | ICD-10-CM | POA: Insufficient documentation

## 2018-09-04 LAB — SARS CORONAVIRUS 2 (TAT 6-24 HRS): SARS Coronavirus 2: NEGATIVE

## 2018-09-06 ENCOUNTER — Encounter: Payer: Self-pay | Admitting: Anesthesiology

## 2018-09-06 DIAGNOSIS — Z01818 Encounter for other preprocedural examination: Secondary | ICD-10-CM | POA: Diagnosis not present

## 2018-09-06 MED ORDER — CEFAZOLIN SODIUM-DEXTROSE 2-4 GM/100ML-% IV SOLN
2.0000 g | INTRAVENOUS | Status: AC
  Administered 2018-09-07: 2 g via INTRAVENOUS

## 2018-09-07 ENCOUNTER — Ambulatory Visit
Admission: RE | Admit: 2018-09-07 | Discharge: 2018-09-07 | Disposition: A | Discharge status: Home / Self Care | Payer: BC Managed Care – PPO | Attending: General Surgery | Admitting: General Surgery

## 2018-09-07 ENCOUNTER — Ambulatory Visit: Payer: BC Managed Care – PPO | Admitting: Anesthesiology

## 2018-09-07 ENCOUNTER — Encounter
Admission: RE | Disposition: A | Discharge status: Home / Self Care | Payer: Self-pay | Source: Home / Self Care | Attending: General Surgery

## 2018-09-07 ENCOUNTER — Encounter: Payer: Self-pay | Admitting: *Deleted

## 2018-09-07 ENCOUNTER — Other Ambulatory Visit: Payer: Self-pay

## 2018-09-07 DIAGNOSIS — K8010 Calculus of gallbladder with chronic cholecystitis without obstruction: Secondary | ICD-10-CM | POA: Insufficient documentation

## 2018-09-07 DIAGNOSIS — G4733 Obstructive sleep apnea (adult) (pediatric): Secondary | ICD-10-CM | POA: Diagnosis not present

## 2018-09-07 DIAGNOSIS — K8020 Calculus of gallbladder without cholecystitis without obstruction: Secondary | ICD-10-CM | POA: Diagnosis not present

## 2018-09-07 DIAGNOSIS — E782 Mixed hyperlipidemia: Secondary | ICD-10-CM | POA: Diagnosis not present

## 2018-09-07 DIAGNOSIS — E119 Type 2 diabetes mellitus without complications: Secondary | ICD-10-CM | POA: Insufficient documentation

## 2018-09-07 DIAGNOSIS — E7800 Pure hypercholesterolemia, unspecified: Secondary | ICD-10-CM | POA: Diagnosis not present

## 2018-09-07 DIAGNOSIS — I10 Essential (primary) hypertension: Secondary | ICD-10-CM | POA: Diagnosis not present

## 2018-09-07 HISTORY — PX: CHOLECYSTECTOMY: SHX55

## 2018-09-07 LAB — POCT PREGNANCY, URINE: Preg Test, Ur: NEGATIVE

## 2018-09-07 SURGERY — LAPAROSCOPIC CHOLECYSTECTOMY
Anesthesia: General

## 2018-09-07 MED ORDER — FENTANYL CITRATE (PF) 100 MCG/2ML IJ SOLN
INTRAMUSCULAR | Status: AC
  Filled 2018-09-07: qty 2

## 2018-09-07 MED ORDER — SUGAMMADEX SODIUM 200 MG/2ML IV SOLN
INTRAVENOUS | Status: DC | PRN
  Administered 2018-09-07: 200 mg via INTRAVENOUS

## 2018-09-07 MED ORDER — SUGAMMADEX SODIUM 200 MG/2ML IV SOLN
INTRAVENOUS | Status: AC
  Filled 2018-09-07: qty 2

## 2018-09-07 MED ORDER — ONDANSETRON HCL 4 MG/2ML IJ SOLN: 4.0000 mg | Freq: Once | INTRAMUSCULAR | Status: DC | PRN

## 2018-09-07 MED ORDER — BUPIVACAINE-EPINEPHRINE (PF) 0.5% -1:200000 IJ SOLN
INTRAMUSCULAR | Status: DC | PRN
  Administered 2018-09-07: 15 mL

## 2018-09-07 MED ORDER — HYDROCODONE-ACETAMINOPHEN 5-325 MG PO TABS: 1.0000 | ORAL_TABLET | ORAL | 0 refills | Status: AC | PRN

## 2018-09-07 MED ORDER — ACETAMINOPHEN 10 MG/ML IV SOLN
INTRAVENOUS | Status: DC | PRN
  Administered 2018-09-07: 1000 mg via INTRAVENOUS

## 2018-09-07 MED ORDER — LIDOCAINE HCL (PF) 2 % IJ SOLN
INTRAMUSCULAR | Status: AC
  Filled 2018-09-07: qty 10

## 2018-09-07 MED ORDER — ROCURONIUM BROMIDE 50 MG/5ML IV SOLN
INTRAVENOUS | Status: AC
  Filled 2018-09-07: qty 1

## 2018-09-07 MED ORDER — IBUPROFEN 800 MG PO TABS
800.0000 mg | ORAL_TABLET | Freq: Once | ORAL | Status: AC
  Administered 2018-09-07: 12:00:00 800 mg via ORAL
  Filled 2018-09-07: qty 1

## 2018-09-07 MED ORDER — FENTANYL CITRATE (PF) 100 MCG/2ML IJ SOLN
INTRAMUSCULAR | Status: DC | PRN
  Administered 2018-09-07: 100 ug via INTRAVENOUS
  Administered 2018-09-07: 50 ug via INTRAVENOUS

## 2018-09-07 MED ORDER — EPHEDRINE SULFATE 50 MG/ML IJ SOLN
INTRAMUSCULAR | Status: AC
  Filled 2018-09-07: qty 1

## 2018-09-07 MED ORDER — FAMOTIDINE 20 MG PO TABS
ORAL_TABLET | ORAL | Status: AC
  Filled 2018-09-07: qty 1

## 2018-09-07 MED ORDER — PROPOFOL 10 MG/ML IV BOLUS
INTRAVENOUS | Status: DC | PRN
  Administered 2018-09-07: 200 mg via INTRAVENOUS

## 2018-09-07 MED ORDER — LIDOCAINE HCL (CARDIAC) PF 100 MG/5ML IV SOSY
PREFILLED_SYRINGE | INTRAVENOUS | Status: DC | PRN
  Administered 2018-09-07: 100 mg via INTRATRACHEAL

## 2018-09-07 MED ORDER — MIDAZOLAM HCL 2 MG/2ML IJ SOLN
INTRAMUSCULAR | Status: DC | PRN
  Administered 2018-09-07: 2 mg via INTRAVENOUS

## 2018-09-07 MED ORDER — ONDANSETRON HCL 4 MG/2ML IJ SOLN
INTRAMUSCULAR | Status: AC
  Filled 2018-09-07: qty 2

## 2018-09-07 MED ORDER — LACTATED RINGERS IV SOLN
INTRAVENOUS | Status: DC
  Administered 2018-09-07: 09:00:00 via INTRAVENOUS

## 2018-09-07 MED ORDER — ACETAMINOPHEN 10 MG/ML IV SOLN
INTRAVENOUS | Status: AC
  Filled 2018-09-07: qty 100

## 2018-09-07 MED ORDER — CEFAZOLIN SODIUM-DEXTROSE 2-4 GM/100ML-% IV SOLN
INTRAVENOUS | Status: AC
  Filled 2018-09-07: qty 100

## 2018-09-07 MED ORDER — FAMOTIDINE 20 MG PO TABS
20.0000 mg | ORAL_TABLET | Freq: Once | ORAL | Status: AC
  Administered 2018-09-07: 20 mg via ORAL

## 2018-09-07 MED ORDER — FENTANYL CITRATE (PF) 100 MCG/2ML IJ SOLN
25.0000 ug | INTRAMUSCULAR | Status: DC | PRN
  Administered 2018-09-07: 25 ug via INTRAVENOUS

## 2018-09-07 MED ORDER — IBUPROFEN 800 MG PO TABS
ORAL_TABLET | ORAL | Status: AC
  Filled 2018-09-07: qty 1

## 2018-09-07 MED ORDER — DEXAMETHASONE SODIUM PHOSPHATE 10 MG/ML IJ SOLN
INTRAMUSCULAR | Status: DC | PRN
  Administered 2018-09-07: 6 mg via INTRAVENOUS

## 2018-09-07 MED ORDER — MIDAZOLAM HCL 2 MG/2ML IJ SOLN
INTRAMUSCULAR | Status: AC
  Filled 2018-09-07: qty 2

## 2018-09-07 MED ORDER — PROPOFOL 10 MG/ML IV BOLUS
INTRAVENOUS | Status: AC
  Filled 2018-09-07: qty 20

## 2018-09-07 MED ORDER — ROCURONIUM BROMIDE 100 MG/10ML IV SOLN
INTRAVENOUS | Status: DC | PRN
  Administered 2018-09-07: 10 mg via INTRAVENOUS
  Administered 2018-09-07: 20 mg via INTRAVENOUS
  Administered 2018-09-07: 30 mg via INTRAVENOUS

## 2018-09-07 MED ORDER — DEXAMETHASONE SODIUM PHOSPHATE 10 MG/ML IJ SOLN
INTRAMUSCULAR | Status: AC
  Filled 2018-09-07: qty 1

## 2018-09-07 SURGICAL SUPPLY — 38 items
APPLIER CLIP 5 13 M/L LIGAMAX5 (MISCELLANEOUS) ×2
BLADE SURG SZ11 CARB STEEL (BLADE) ×2 IMPLANT
CANISTER SUCT 1200ML W/VALVE (MISCELLANEOUS) ×2 IMPLANT
CATH CHOLANG 76X19 KUMAR (CATHETERS) ×1 IMPLANT
CHLORAPREP W/TINT 26 (MISCELLANEOUS) ×2 IMPLANT
CLIP APPLIE 5 13 M/L LIGAMAX5 (MISCELLANEOUS) ×1 IMPLANT
COVER WAND RF STERILE (DRAPES) ×2 IMPLANT
DERMABOND ADVANCED (GAUZE/BANDAGES/DRESSINGS) ×1
DERMABOND ADVANCED .7 DNX12 (GAUZE/BANDAGES/DRESSINGS) ×1 IMPLANT
ELECT REM PT RETURN 9FT ADLT (ELECTROSURGICAL) ×2
ELECTRODE REM PT RTRN 9FT ADLT (ELECTROSURGICAL) ×1 IMPLANT
GLOVE BIO SURGEON STRL SZ 6.5 (GLOVE) ×2 IMPLANT
GLOVE INDICATOR 6.5 STRL GRN (GLOVE) ×2 IMPLANT
GOWN STRL REUS W/ TWL LRG LVL3 (GOWN DISPOSABLE) ×4 IMPLANT
GOWN STRL REUS W/TWL LRG LVL3 (GOWN DISPOSABLE) ×4
GRASPER SUT TROCAR 14GX15 (MISCELLANEOUS) IMPLANT
HEMOSTAT SURGICEL 2X3 (HEMOSTASIS) IMPLANT
IRRIGATION STRYKERFLOW (MISCELLANEOUS) ×1 IMPLANT
IRRIGATOR STRYKERFLOW (MISCELLANEOUS) ×2
IV NS 1000ML (IV SOLUTION) ×1
IV NS 1000ML BAXH (IV SOLUTION) ×1 IMPLANT
KIT TURNOVER KIT A (KITS) ×2 IMPLANT
LABEL OR SOLS (LABEL) ×2 IMPLANT
NDL HYPO 25X1 1.5 SAFETY (NEEDLE) ×1 IMPLANT
NDL INSUFFLATION 14GA 120MM (NEEDLE) ×1 IMPLANT
NEEDLE HYPO 25X1 1.5 SAFETY (NEEDLE) ×2 IMPLANT
NEEDLE INSUFFLATION 14GA 120MM (NEEDLE) ×2 IMPLANT
NS IRRIG 500ML POUR BTL (IV SOLUTION) ×2 IMPLANT
PACK LAP CHOLECYSTECTOMY (MISCELLANEOUS) ×2 IMPLANT
POUCH SPECIMEN RETRIEVAL 10MM (ENDOMECHANICALS) ×2 IMPLANT
SCISSORS METZENBAUM CVD 33 (INSTRUMENTS) ×2 IMPLANT
SET TUBE SMOKE EVAC HIGH FLOW (TUBING) ×2 IMPLANT
SLEEVE ENDOPATH XCEL 5M (ENDOMECHANICALS) ×4 IMPLANT
SUT MNCRL AB 4-0 PS2 18 (SUTURE) ×2 IMPLANT
SUT VIC AB 0 CT1 36 (SUTURE) ×1 IMPLANT
SUT VICRYL 0 AB UR-6 (SUTURE) ×2 IMPLANT
TROCAR XCEL NON-BLD 11X100MML (ENDOMECHANICALS) ×2 IMPLANT
TROCAR XCEL NON-BLD 5MMX100MML (ENDOMECHANICALS) ×2 IMPLANT

## 2018-09-07 NOTE — Discharge Instructions (Addendum)
  Diet: Resume home heart healthy regular diet.   Activity: No heavy lifting >20 pounds (children, pets, laundry, garbage) or strenuous activity until follow-up, but light activity and walking are encouraged. Do not drive or drink alcohol if taking narcotic pain medications.  Wound care: May shower with soapy water and pat dry (do not rub incisions), but no baths or submerging incision underwater until follow-up. (no swimming)   Medications: Resume all home medications. For mild to moderate pain: acetaminophen (Tylenol) or ibuprofen (if no kidney disease). Combining Tylenol with alcohol can substantially increase your risk of causing liver disease. Narcotic pain medications, if prescribed, can be used for severe pain, though may cause nausea, constipation, and drowsiness. Do not combine Tylenol and Norco within a 6 hour period as Norco contains Tylenol. If you do not need the narcotic pain medication, you do not need to fill the prescription.  Call office ([phone]) at any time if any questions, worsening pain, fevers/chills, bleeding, drainage from incision site, or other concerns.   AMBULATORY SURGERY  DISCHARGE INSTRUCTIONS   1) The drugs that you were given will stay in your system until tomorrow so for the next 24 hours you should not:  A) Drive an automobile B) Make any legal decisions C) Drink any alcoholic beverage   2) You may resume regular meals tomorrow.  Today it is better to start with liquids and gradually work up to solid foods.  You may eat anything you prefer, but it is better to start with liquids, then soup and crackers, and gradually work up to solid foods.   3) Please notify your doctor immediately if you have any unusual bleeding, trouble breathing, redness and pain at the surgery site, drainage, fever, or pain not relieved by medication.    4) Additional Instructions:        Please contact your physician with any problems or Same Day Surgery at  [phone], Monday through Friday 6 am to 4 pm, or [facility] [facility] [place] [facility] number at [phone]. 

## 2018-09-07 NOTE — Anesthesia Procedure Notes (Addendum)
Procedure Name: Intubation Date/Time: 09/07/2018 9:35 AM Performed by: Babs Sciara, CRNA Pre-anesthesia Checklist: Patient identified, Emergency Drugs available, Suction available, Patient being monitored and Timeout performed Patient Re-evaluated:Patient Re-evaluated prior to induction Oxygen Delivery Method: Circle system utilized Preoxygenation: Pre-oxygenation with 100% oxygen Induction Type: IV induction Ventilation: Mask ventilation without difficulty Laryngoscope Size: Mac and 3 Grade View: Grade I Tube type: Oral Tube size: 7.5 mm Number of attempts: 2 Airway Equipment and Method: Stylet Placement Confirmation: ETT inserted through vocal cords under direct vision,  positive ETCO2 and breath sounds checked- equal and bilateral Secured at: 21 (lip) cm Dental Injury: Teeth and Oropharynx as per pre-operative assessment

## 2018-09-07 NOTE — Interval H&P Note (Signed)
History and Physical Interval Note:  09/07/2018 8:50 AM  Joy Houston  has presented today for surgery, with the diagnosis of 54360O77.03 CHOLELITHIASIS W/O CHOLECYSITIS.  The various methods of treatment have been discussed with the patient and family. After consideration of risks, benefits and other options for treatment, the patient has consented to  Procedure(s): LAPAROSCOPIC CHOLECYSTECTOMY (N/A) as a surgical intervention.  The patient's history has been reviewed, patient examined, no change in status, stable for surgery.  I have reviewed the patient's chart and labs.  Questions were answered to the patient's satisfaction.     Herbert Pun

## 2018-09-07 NOTE — Anesthesia Preprocedure Evaluation (Signed)
Anesthesia Evaluation  Patient identified by MRN, date of birth, ID band Patient awake    Reviewed: Allergy & Precautions, NPO status , Patient's Chart, lab work & pertinent test results, reviewed documented beta blocker date and time   History of Anesthesia Complications (+) PONV, Family history of anesthesia reaction and history of anesthetic complications  Airway Mallampati: III  TM Distance: >3 FB     Dental  (+) Chipped   Pulmonary sleep apnea ,           Cardiovascular hypertension, Pt. on medications and Pt. on home beta blockers + dysrhythmias Atrial Fibrillation      Neuro/Psych    GI/Hepatic   Endo/Other  diabetes, Type 2  Renal/GU      Musculoskeletal   Abdominal   Peds  Hematology  (+) anemia ,   Anesthesia Other Findings Obese. Hx of tachycardia. EKG ok.  Reproductive/Obstetrics                             Anesthesia Physical Anesthesia Plan  ASA: III  Anesthesia Plan: General   Post-op Pain Management:    Induction: Intravenous  PONV Risk Score and Plan:   Airway Management Planned: Oral ETT  Additional Equipment:   Intra-op Plan:   Post-operative Plan:   Informed Consent: I have reviewed the patients History and Physical, chart, labs and discussed the procedure including the risks, benefits and alternatives for the proposed anesthesia with the patient or authorized representative who has indicated his/her understanding and acceptance.       Plan Discussed with: CRNA  Anesthesia Plan Comments:         Anesthesia Quick Evaluation

## 2018-09-07 NOTE — Anesthesia Postprocedure Evaluation (Signed)
Anesthesia Post Note  Patient: Joy Houston  Procedure(s) Performed: LAPAROSCOPIC CHOLECYSTECTOMY (N/A )  Patient location during evaluation: PACU Anesthesia Type: General Level of consciousness: awake and alert Pain management: pain level controlled Vital Signs Assessment: post-procedure vital signs reviewed and stable Respiratory status: spontaneous breathing, nonlabored ventilation, respiratory function stable and patient connected to nasal cannula oxygen Cardiovascular status: blood pressure returned to baseline and stable Postop Assessment: no apparent nausea or vomiting Anesthetic complications: no     Last Vitals:  Vitals:   09/07/18 1132 09/07/18 1237  BP: (!) 150/66 (!) 143/77  Pulse: 63 65  Resp: 16 16  Temp: (!) 36.4 C 36.7 C  SpO2: 98% 98%    Last Pain:  Vitals:   09/07/18 1237  TempSrc: Temporal  PainSc: [place]

## 2018-09-07 NOTE — Anesthesia Post-op Follow-up Note (Signed)
Anesthesia QCDR form completed.        

## 2018-09-07 NOTE — Transfer of Care (Signed)
Immediate Anesthesia Transfer of Care Note  Patient: Joy Houston  Procedure(s) Performed: LAPAROSCOPIC CHOLECYSTECTOMY (N/A )  Patient Location: PACU  Anesthesia Type:General  Level of Consciousness: awake, alert  and oriented  Airway & Oxygen Therapy: Patient Spontanous Breathing and Patient connected to face mask  Post-op Assessment: Report given to RN and Post -op Vital signs reviewed and stable  Post vital signs: Reviewed and stable  Last Vitals:  Vitals Value Taken Time  BP 156/83 09/07/18 1043  Temp 36.3 C 09/07/18 1043  Pulse 75 09/07/18 1046  Resp 16 09/07/18 1043  SpO2 100 % 09/07/18 1046  Vitals shown include unvalidated device data.  Last Pain:  Vitals:   09/07/18 1043  TempSrc:   PainSc: Asleep         Complications: No apparent anesthesia complications

## 2018-09-07 NOTE — Progress Notes (Signed)
Ch visited pt in pre-op. Pt requested prayer. Pt and nurse were present as ch prayed for a successful surgery and recovery.  No further needs at this time.    09/07/18 0804  Clinical Encounter Type  Visited With Patient;Health care provider  Visit Type Pre-op  Referral From Patient  Consult/Referral To Chaplain  Spiritual Encounters  Spiritual Needs Prayer  Stress Factors  Patient Stress Factors Health changes  Family Stress Factors None identified  Advance Directives (For Healthcare)  Does Patient Have a Medical Advance Directive? Yes  Does patient want to make changes to medical advance directive? No - Patient declined  Type of Paramedic of [place];Living will  Copy of [place] in Chart? No - copy requested

## 2018-09-07 NOTE — Op Note (Signed)
Preoperative diagnosis: Symptomatic cholelithiasis  Postoperative diagnosis: Symptomatic cholelithiasis.  Procedure: Laparoscopic Cholecystectomy.   Anesthesia: GETA   Surgeon: Dr. Windell Moment  Wound Classification: Clean Contaminated  Indications: Patient is a 53 y.o. female developed right upper quadrant pain and on workup was found to have cholelithiasis with a normal common duct. Laparoscopic cholecystectomy was elected.  Findings: Critical view of safety achieved Cystic duct and artery identified, ligated and divided Adequate hemostasis  Description of procedure: The patient was placed on the operating table in the supine position. General anesthesia was induced. A time-out was completed verifying correct patient, procedure, site, positioning, and implant(s) and/or special equipment prior to beginning this procedure. An orogastric tube was placed. The abdomen was prepped and draped in the usual sterile fashion.  An incision was made in a natural skin line above the umbilicus.  The fascia was elevated and the Veress needle inserted. Proper position was confirmed by aspiration and saline meniscus test.  The abdomen was insufflated with carbon dioxide to a pressure of 15 mmHg. The patient tolerated insufflation well. A 11-mm trocar was then inserted.  The laparoscope was inserted and the abdomen inspected. No injuries from initial trocar placement were noted. Additional trocars were then inserted in the following locations: a 5-mm trocar in the right epigastrium and two 5-mm trocars along the right costal margin. The abdomen was inspected and no abnormalities were found. The table was placed in the reverse Trendelenburg position with the right side up.  Filmy adhesions between the gallbladder and omentum, duodenum and transverse colon were lysed sharply. The dome of the gallbladder was grasped with an atraumatic grasper passed through the lateral port and retracted over the dome of the  liver. The infundibulum was also grasped with an atraumatic grasper through the midclavicular port and retracted toward the right lower quadrant. This maneuver exposed Calot's triangle. The peritoneum overlying the gallbladder infundibulum was then incised and the cystic duct and cystic artery identified and circumferentially dissected. Critical view of safety reviewed before ligating any structure. The cystic duct and cystic artery were then doubly clipped and divided close to the gallbladder.  The gallbladder was then dissected from its peritoneal attachments by electrocautery. Hemostasis was checked and the gallbladder and contained stones were removed using an endoscopic retrieval bag placed through the umbilical port. The gallbladder was passed off the table as a specimen. The gallbladder fossa was copiously irrigated with saline and hemostasis was obtained. There was no evidence of bleeding from the gallbladder fossa or cystic artery or leakage of the bile from the cystic duct stump. Secondary trocars were removed under direct vision. No bleeding was noted. The laparoscope was withdrawn and the umbilical trocar removed. The abdomen was allowed to collapse. The fascia of the 41mm trocar sites was closed with figure-of-eight 0 vicryl sutures. The skin was closed with subcuticular sutures of 4-0 monocryl and topical skin adhesive. The orogastric tube was removed.  The patient tolerated the procedure well and was taken to the postanesthesia care unit in stable condition.   Specimen: Gallbladder  Complications: None  EBL: 5 mL

## 2018-09-08 ENCOUNTER — Encounter: Payer: Self-pay | Admitting: General Surgery

## 2018-09-10 LAB — SURGICAL PATHOLOGY

## 2018-09-25 DIAGNOSIS — G4733 Obstructive sleep apnea (adult) (pediatric): Secondary | ICD-10-CM | POA: Diagnosis not present

## 2018-09-29 ENCOUNTER — Other Ambulatory Visit: Payer: Self-pay | Admitting: Internal Medicine

## 2018-10-09 ENCOUNTER — Encounter: Payer: Self-pay | Admitting: Internal Medicine

## 2018-10-10 NOTE — Telephone Encounter (Signed)
She needs a fasting lab appt.  Please schedule and send her appt date and time. Thanks

## 2018-10-12 ENCOUNTER — Other Ambulatory Visit (INDEPENDENT_AMBULATORY_CARE_PROVIDER_SITE_OTHER): Payer: BC Managed Care – PPO

## 2018-10-12 ENCOUNTER — Telehealth: Payer: Self-pay | Admitting: *Deleted

## 2018-10-12 ENCOUNTER — Other Ambulatory Visit: Payer: Self-pay | Admitting: Internal Medicine

## 2018-10-12 ENCOUNTER — Other Ambulatory Visit: Payer: Self-pay

## 2018-10-12 DIAGNOSIS — I10 Essential (primary) hypertension: Secondary | ICD-10-CM

## 2018-10-12 DIAGNOSIS — E7800 Pure hypercholesterolemia, unspecified: Secondary | ICD-10-CM

## 2018-10-12 DIAGNOSIS — E119 Type 2 diabetes mellitus without complications: Secondary | ICD-10-CM

## 2018-10-12 DIAGNOSIS — R5383 Other fatigue: Secondary | ICD-10-CM

## 2018-10-12 DIAGNOSIS — R945 Abnormal results of liver function studies: Secondary | ICD-10-CM

## 2018-10-12 LAB — BASIC METABOLIC PANEL
BUN: 13 mg/dL (ref 6–23)
CO2: 27 mEq/L (ref 19–32)
Calcium: 9.5 mg/dL (ref 8.4–10.5)
Chloride: 102 mEq/L (ref 96–112)
Creatinine, Ser: 0.69 mg/dL (ref 0.40–1.20)
GFR: 89.63 mL/min (ref >60.00)
Glucose, Bld: 91 mg/dL (ref 70–99)
Potassium: 4 mEq/L (ref 3.5–5.1)
Sodium: 138 mEq/L (ref 135–145)

## 2018-10-12 LAB — LIPID PANEL
Cholesterol: 198 mg/dL (ref 0–200)
HDL: 54.9 mg/dL (ref >39.00)
LDL Cholesterol: 124 mg/dL — ABNORMAL HIGH (ref 0–99)
NonHDL: 142.89
Total CHOL/HDL Ratio: 4
Triglycerides: 96 mg/dL (ref 0.0–149.0)
VLDL: 19.2 mg/dL (ref 0.0–40.0)

## 2018-10-12 LAB — CBC WITH DIFFERENTIAL/PLATELET
Basophils Absolute: 0.1 10*3/uL (ref 0.0–0.1)
Basophils Relative: 0.8 % (ref 0.0–3.0)
Eosinophils Absolute: 0.3 10*3/uL (ref 0.0–0.7)
Eosinophils Relative: 3.9 % (ref 0.0–5.0)
HCT: 42.6 % (ref 36.0–46.0)
Hemoglobin: 14.4 g/dL (ref 12.0–15.0)
Lymphocytes Relative: 34.1 % (ref 12.0–46.0)
Lymphs Abs: 3 10*3/uL (ref 0.7–4.0)
MCHC: 33.9 g/dL (ref 30.0–36.0)
MCV: 91.9 fl (ref 78.0–100.0)
Monocytes Absolute: 0.6 10*3/uL (ref 0.1–1.0)
Monocytes Relative: 6.5 % (ref 3.0–12.0)
Neutro Abs: 4.8 10*3/uL (ref 1.4–7.7)
Neutrophils Relative %: 54.7 % (ref 43.0–77.0)
Platelets: 248 10*3/uL (ref 150.0–400.0)
RBC: 4.63 Mil/uL (ref 3.87–5.11)
RDW: 13.6 % (ref 11.5–15.5)
WBC: 8.8 10*3/uL (ref 4.0–10.5)

## 2018-10-12 LAB — HEPATIC FUNCTION PANEL
ALT: 28 U/L (ref 0–35)
AST: 21 U/L (ref 0–37)
Albumin: 4.6 g/dL (ref 3.5–5.2)
Alkaline Phosphatase: 83 U/L (ref 39–117)
Bilirubin, Direct: 0.1 mg/dL (ref 0.0–0.3)
Total Bilirubin: 0.6 mg/dL (ref 0.2–1.2)
Total Protein: 7.7 g/dL (ref 6.0–8.3)

## 2018-10-12 LAB — HEMOGLOBIN A1C: Hgb A1c MFr Bld: 5.8 % (ref 4.6–6.5)

## 2018-10-12 LAB — TSH: TSH: 2.91 u[IU]/mL (ref 0.35–4.50)

## 2018-10-12 NOTE — Telephone Encounter (Signed)
Add on sheet faxed. Thanks

## 2018-10-12 NOTE — Telephone Encounter (Signed)
Order placed for add on vitamin D.  

## 2018-10-12 NOTE — Telephone Encounter (Signed)
Pt would like to add a Vit D to labs drawn today. Please place future order if agreeable

## 2018-10-12 NOTE — Progress Notes (Signed)
Order placed for add on vitamin D.  

## 2018-10-15 ENCOUNTER — Other Ambulatory Visit (INDEPENDENT_AMBULATORY_CARE_PROVIDER_SITE_OTHER): Payer: BC Managed Care – PPO

## 2018-10-15 DIAGNOSIS — R5383 Other fatigue: Secondary | ICD-10-CM

## 2018-10-15 NOTE — Telephone Encounter (Signed)
Pt had fasting labs done

## 2018-10-16 ENCOUNTER — Encounter: Payer: Self-pay | Admitting: Internal Medicine

## 2018-10-16 ENCOUNTER — Other Ambulatory Visit: Payer: Self-pay | Admitting: Internal Medicine

## 2018-10-16 LAB — VITAMIN D 25 HYDROXY (VIT D DEFICIENCY, FRACTURES): VITD: 19.26 ng/mL — ABNORMAL LOW (ref 30.00–100.00)

## 2018-10-25 DIAGNOSIS — R002 Palpitations: Secondary | ICD-10-CM | POA: Diagnosis not present

## 2018-10-25 DIAGNOSIS — I10 Essential (primary) hypertension: Secondary | ICD-10-CM | POA: Diagnosis not present

## 2018-10-25 DIAGNOSIS — I480 Paroxysmal atrial fibrillation: Secondary | ICD-10-CM | POA: Diagnosis not present

## 2018-10-25 DIAGNOSIS — E782 Mixed hyperlipidemia: Secondary | ICD-10-CM | POA: Diagnosis not present

## 2018-10-26 DIAGNOSIS — G4733 Obstructive sleep apnea (adult) (pediatric): Secondary | ICD-10-CM | POA: Diagnosis not present

## 2018-11-07 DIAGNOSIS — G4733 Obstructive sleep apnea (adult) (pediatric): Secondary | ICD-10-CM | POA: Diagnosis not present

## 2018-11-25 DIAGNOSIS — G4733 Obstructive sleep apnea (adult) (pediatric): Secondary | ICD-10-CM | POA: Diagnosis not present

## 2018-12-05 ENCOUNTER — Other Ambulatory Visit: Payer: Self-pay

## 2018-12-07 ENCOUNTER — Ambulatory Visit (INDEPENDENT_AMBULATORY_CARE_PROVIDER_SITE_OTHER): Payer: BC Managed Care – PPO | Admitting: Internal Medicine

## 2018-12-07 ENCOUNTER — Other Ambulatory Visit: Payer: Self-pay

## 2018-12-07 ENCOUNTER — Other Ambulatory Visit (HOSPITAL_COMMUNITY)
Admission: RE | Admit: 2018-12-07 | Discharge: 2018-12-07 | Disposition: A | Discharge status: Home / Self Care | Payer: BC Managed Care – PPO | Source: Ambulatory Visit | Attending: Internal Medicine | Admitting: Internal Medicine

## 2018-12-07 VITALS — BP 124/86 | HR 90 | Temp 97.3°F | Resp 16 | Ht 66.0 in | Wt 243.4 lb

## 2018-12-07 DIAGNOSIS — I4891 Unspecified atrial fibrillation: Secondary | ICD-10-CM | POA: Diagnosis not present

## 2018-12-07 DIAGNOSIS — Z0000 Encounter for general adult medical examination without abnormal findings: Secondary | ICD-10-CM | POA: Diagnosis not present

## 2018-12-07 DIAGNOSIS — R945 Abnormal results of liver function studies: Secondary | ICD-10-CM

## 2018-12-07 DIAGNOSIS — I10 Essential (primary) hypertension: Secondary | ICD-10-CM

## 2018-12-07 DIAGNOSIS — R197 Diarrhea, unspecified: Secondary | ICD-10-CM

## 2018-12-07 DIAGNOSIS — Z124 Encounter for screening for malignant neoplasm of cervix: Secondary | ICD-10-CM

## 2018-12-07 DIAGNOSIS — E7800 Pure hypercholesterolemia, unspecified: Secondary | ICD-10-CM

## 2018-12-07 DIAGNOSIS — E119 Type 2 diabetes mellitus without complications: Secondary | ICD-10-CM

## 2018-12-07 NOTE — Progress Notes (Signed)
Patient ID: Joy Houston, female   DOB: 1965-06-06, 53 y.o.   MRN: [id number]   Subjective:    Patient ID: Joy Houston, female    DOB: 12-07-1965, 53 y.o.   MRN: [id number]  HPI  Patient here for her physical exam.  Is s/p lap cholecystectomy 09/07/18.  Some loose stool after surgery.  Is better.  Eating and drinking well.  No chest pain.  Still having intermittent episodes of afib.  States can tell when she is in afib.  Not taking metoprolol now.  States she was allergic to metoprolol - had foot swelling.  One foot.  No rash.  No trouble breathing,lip swelling,etc.  No sob.  No abdominal pain.  Bowels moving.  Increased stress.  Discussed with her today.  Has good support.  Does not feel needs any further intervention at this time.     Past Medical History:  Diagnosis Date  . Anemia   . Atrial fibrillation ([place])   . Complication of anesthesia   . Diabetes mellitus without complication (HCC)    diet controlled  . Eczema   . Family history of anesthesia complication    Mother - confusion  . Fatty liver   . Heart murmur    Slight - "nothing to worry about"  . Hemorrhoid   . Hypertension   . PONV (postoperative nausea and vomiting)   . Sleep apnea    mild   Past Surgical History:  Procedure Laterality Date  . CESAREAN SECTION    . CHOLECYSTECTOMY N/A 09/07/2018   Procedure: LAPAROSCOPIC CHOLECYSTECTOMY;  Surgeon: Herbert Pun, MD;  Location: [facility] ORS;  Service: General;  Laterality: N/A;  . FINGER SURGERY     pin to 3rd finger Right hand  . LUMBAR LAMINECTOMY/DECOMPRESSION MICRODISCECTOMY  12/15/2011   Procedure: LUMBAR LAMINECTOMY/DECOMPRESSION MICRODISCECTOMY 1 LEVEL;  Surgeon: Ophelia Charter, MD;  Location: [facility];  Service: Neurosurgery;  Laterality: Right;  RIGHT Lumbar four-Five diskectomy  . MICRODISCECTOMY LUMBAR     L4-5  . WISDOM TOOTH EXTRACTION     Family History  Problem Relation Age of Onset  . GER disease Mother   . Diabetes Father    . Cancer Father        Melanoma, Prostate  . Heart disease Father   . Heart disease Maternal Grandfather   . Diabetes Paternal Grandfather   . Breast cancer Sister 76   Social History   Socioeconomic History  . Marital status: Married    Spouse name: Not on file  . Number of children: 2  . Years of education: Not on file  . Highest education level: Not on file  Occupational History    Employer: [employer]  . Financial resource strain: Not on file  . Food insecurity    Worry: Not on file    Inability: Not on file  . Transportation needs    Medical: Not on file    Non-medical: Not on file  Tobacco Use  . Smoking status: Never Smoker  . Smokeless tobacco: Never Used  Substance and Sexual Activity  . Alcohol use: Yes    Alcohol/week: 0.0 standard drinks    Comment: rare  . Drug use: No  . Sexual activity: Not on file  Lifestyle  . Physical activity    Days per week: Not on file    Minutes per session: Not on file  . Stress: Not on file  Relationships  . Social connections    Talks  on phone: Not on file    Gets together: Not on file    Attends religious service: Not on file    Active member of club or organization: Not on file    Attends meetings of clubs or organizations: Not on file    Relationship status: Not on file  Other Topics Concern  . Not on file  Social History Narrative   Nurse    Married   Outpatient Encounter Medications as of 12/07/2018  Medication Sig  . EPINEPHrine 0.3 mg/0.3 mL IJ SOAJ injection Inject 0.3 mg into the muscle as needed for anaphylaxis.   . metoprolol succinate (TOPROL-XL) 25 MG 24 hr tablet Take 25 mg by mouth daily.  . metoprolol tartrate (LOPRESSOR) 25 MG tablet TAKE 1/2 TABLET BY MOUTH DAILY AS NEEDED   No facility-administered encounter medications on file as of 12/07/2018.     Review of Systems  Constitutional: Negative for appetite change and unexpected weight change.  HENT: Negative for  congestion and sinus pressure.   Eyes: Negative for pain and visual disturbance.  Respiratory: Negative for cough, chest tightness and shortness of breath.   Cardiovascular: Negative for chest pain and leg swelling.       Intermittent flares with afib.    Gastrointestinal: Negative for abdominal pain, nausea and vomiting.       Intermittent loose stool as outlined.    Genitourinary: Negative for difficulty urinating and dysuria.  Musculoskeletal: Negative for joint swelling and myalgias.  Skin: Negative for color change and rash.  Neurological: Negative for dizziness, light-headedness and headaches.  Hematological: Negative for adenopathy. Does not bruise/bleed easily.  Psychiatric/Behavioral: Negative for agitation and dysphoric mood.       Objective:    Physical Exam Constitutional:      General: She is not in acute distress.    Appearance: Normal appearance. She is well-developed.  HENT:     Head: Normocephalic and atraumatic.  Eyes:     General: No scleral icterus.       Right eye: No discharge.        Left eye: No discharge.     Conjunctiva/sclera: Conjunctivae normal.  Neck:     Musculoskeletal: Neck supple. No muscular tenderness.     Thyroid: No thyromegaly.  Cardiovascular:     Rate and Rhythm: Normal rate and regular rhythm.  Pulmonary:     Effort: No tachypnea, accessory muscle usage or respiratory distress.     Breath sounds: Normal breath sounds. No decreased breath sounds or wheezing.  Chest:     Breasts:        Right: No inverted nipple, mass, nipple discharge or tenderness (no axillary adenopathy).        Left: No inverted nipple, mass, nipple discharge or tenderness (no axilarry adenopathy).  Abdominal:     General: Bowel sounds are normal.     Palpations: Abdomen is soft.     Tenderness: There is no abdominal tenderness.  Genitourinary:    Comments: Normal external genitalia.  Vaginal vault without lesions.  Cervix identified.  Pap smear performed.   Could not appreciate any adnexal masses or tenderness.   Musculoskeletal:        General: No swelling or tenderness.  Lymphadenopathy:     Cervical: No cervical adenopathy.  Skin:    Findings: No erythema or rash.  Neurological:     Mental Status: She is alert and oriented to person, place, and time.  Psychiatric:        Mood  and Affect: Mood normal.        Behavior: Behavior normal.     BP 124/86   Pulse 90   Temp (!) 97.3 F (36.3 C)   Resp 16   Ht _0  (1.676 m)   Wt 243 lb 6.4 oz (110.4 kg)   SpO2 97%   BMI 39.29 kg/m  Wt Readings from Last 3 Encounters:  12/07/18 243 lb 6.4 oz (110.4 kg)  09/07/18 235 lb (106.6 kg)  09/03/18 235 lb (106.6 kg)     Lab Results  Component Value Date   WBC 8.8 10/12/2018   HGB 14.4 10/12/2018   HCT 42.6 10/12/2018   PLT 248.0 10/12/2018   GLUCOSE 91 10/12/2018   CHOL 198 10/12/2018   TRIG 96.0 10/12/2018   HDL 54.90 10/12/2018   LDLDIRECT 145.4 03/01/2013   LDLCALC 124 (H) 10/12/2018   ALT 28 10/12/2018   AST 21 10/12/2018   NA 138 10/12/2018   K 4.0 10/12/2018   CL 102 10/12/2018   CREATININE 0.69 10/12/2018   BUN 13 10/12/2018   CO2 27 10/12/2018   TSH 2.91 10/12/2018   HGBA1C 5.8 10/12/2018   MICROALBUR 0.9 05/06/2014       Assessment & Plan:   Problem List Items Addressed This Visit    A-fib ([place])    Persistent intermittent afib.  Sees cardiology.  Off metoprolol now.  Discussed continued intermittent flares.  Discussed the need to f/u with cardiology regarding further intervention (question of cardioversion).        Abnormal liver function    Diet and exercise.  Follow liver function tests.        Diabetes mellitus without complication (HCC)    Low carb diet and exercise.  Follow met b and a1c.       Diarrhea    Loose stool as outlined.  Noticed after gallbladder surgery.  Some better.  Follow.        Health care maintenance    Physical today 12/07/18.  PAP 12/07/18.  Mammogram 01/23/18 - Birads I.   Colonoscopy 11/2010.        Hypercholesterolemia    Low cholesterol diet and exercise. Follow lipid panel and liver function tests.        Hypertension    Blood pressure as outlined.  Continue current medication regimen.  Have her spot check her pressure.  Follow metabolic panel.         Other Visit Diagnoses    Cervical cancer screening    -  Primary   Relevant Orders   Cytology - PAP( [place])       Einar Pheasant, MD

## 2018-12-12 ENCOUNTER — Encounter: Payer: Self-pay | Admitting: Internal Medicine

## 2018-12-12 NOTE — Assessment & Plan Note (Signed)
Persistent intermittent afib.  Sees cardiology.  Off metoprolol now.  Discussed continued intermittent flares.  Discussed the need to f/u with cardiology regarding further intervention (question of cardioversion).

## 2018-12-12 NOTE — Assessment & Plan Note (Signed)
Low carb diet and exercise.  Follow met b and a1c.  

## 2018-12-12 NOTE — Assessment & Plan Note (Signed)
Loose stool as outlined.  Noticed after gallbladder surgery.  Some better.  Follow.

## 2018-12-12 NOTE — Assessment & Plan Note (Signed)
Blood pressure as outlined.  Continue current medication regimen.  Have her spot check her pressure.  Follow metabolic panel.

## 2018-12-12 NOTE — Assessment & Plan Note (Signed)
Physical today 12/07/18.  PAP 12/07/18.  Mammogram 01/23/18 - Birads I.  Colonoscopy 11/2010.

## 2018-12-12 NOTE — Assessment & Plan Note (Signed)
Low cholesterol diet and exercise.  Follow lipid panel and liver function tests.  

## 2018-12-12 NOTE — Assessment & Plan Note (Signed)
Diet and exercise.  Follow liver function tests.   

## 2018-12-13 LAB — CYTOLOGY - PAP
Comment: NEGATIVE
Diagnosis: NEGATIVE
High risk HPV: NEGATIVE

## 2018-12-15 ENCOUNTER — Encounter: Payer: Self-pay | Admitting: Internal Medicine

## 2018-12-20 ENCOUNTER — Other Ambulatory Visit: Payer: Self-pay

## 2018-12-20 MED ORDER — ONETOUCH ULTRASOFT LANCETS MISC: 12 refills | Status: DC

## 2018-12-20 MED ORDER — GLUCOSE BLOOD VI STRP: ORAL_STRIP | 12 refills | Status: DC

## 2018-12-20 NOTE — Telephone Encounter (Signed)
Please send rx for test strips.  See her my chart message.

## 2019-01-01 DIAGNOSIS — I10 Essential (primary) hypertension: Secondary | ICD-10-CM | POA: Diagnosis not present

## 2019-01-01 DIAGNOSIS — R002 Palpitations: Secondary | ICD-10-CM | POA: Diagnosis not present

## 2019-01-01 DIAGNOSIS — E782 Mixed hyperlipidemia: Secondary | ICD-10-CM | POA: Diagnosis not present

## 2019-01-01 DIAGNOSIS — I480 Paroxysmal atrial fibrillation: Secondary | ICD-10-CM | POA: Diagnosis not present

## 2019-01-08 ENCOUNTER — Other Ambulatory Visit: Payer: Self-pay

## 2019-01-08 ENCOUNTER — Ambulatory Visit: Payer: BC Managed Care – PPO | Admitting: Internal Medicine

## 2019-01-08 VITALS — BP 146/78 | HR 80 | Temp 96.4°F | Resp 16 | Wt 250.2 lb

## 2019-01-08 DIAGNOSIS — Z6841 Body Mass Index (BMI) 40.0 and over, adult: Secondary | ICD-10-CM

## 2019-01-08 DIAGNOSIS — R197 Diarrhea, unspecified: Secondary | ICD-10-CM

## 2019-01-08 DIAGNOSIS — I10 Essential (primary) hypertension: Secondary | ICD-10-CM | POA: Diagnosis not present

## 2019-01-08 DIAGNOSIS — R945 Abnormal results of liver function studies: Secondary | ICD-10-CM

## 2019-01-08 DIAGNOSIS — I4891 Unspecified atrial fibrillation: Secondary | ICD-10-CM | POA: Diagnosis not present

## 2019-01-08 DIAGNOSIS — E1165 Type 2 diabetes mellitus with hyperglycemia: Secondary | ICD-10-CM | POA: Insufficient documentation

## 2019-01-08 DIAGNOSIS — R600 Localized edema: Secondary | ICD-10-CM

## 2019-01-08 DIAGNOSIS — E7800 Pure hypercholesterolemia, unspecified: Secondary | ICD-10-CM

## 2019-01-08 NOTE — Progress Notes (Signed)
Patient ID: Joy Houston, female   DOB: 1965-06-01, 53 y.o.   MRN: [id number]   Subjective:    Patient ID: Joy Houston, female    DOB: 1965/07/25, 53 y.o.   MRN: [id number]  HPI This visit occurred during the SARS-CoV-2 public health emergency.  Safety protocols were in place, including screening questions prior to the visit, additional usage of staff PPE, and extensive cleaning of exam room while observing appropriate contact time as indicated for disinfecting solutions.  Patient here for a scheduled follow up.  Has known afib.  Seeing cardiology.  Back on metoprolol.  Wore a monitor.  Just turned in.  Has been noticing more afib recently.  Reported to cardiology.  Just started back on metoprolol.  Feels may be a little better.  No chest pain.  Breathing overall stable.  Not watching her diet.  Not exercising.  No abdominal pain.  Bowels moving.  Handling stress.  Is s/p cholecystectomy.  With persistent loose stools.  Concerned her electrolytes may be "off".     Past Medical History:  Diagnosis Date  . Anemia   . Atrial fibrillation ([place])   . Complication of anesthesia   . Diabetes mellitus without complication (HCC)    diet controlled  . Eczema   . Family history of anesthesia complication    Mother - confusion  . Fatty liver   . Heart murmur    Slight - "nothing to worry about"  . Hemorrhoid   . Hypertension   . PONV (postoperative nausea and vomiting)   . Sleep apnea    mild   Past Surgical History:  Procedure Laterality Date  . CESAREAN SECTION    . CHOLECYSTECTOMY N/A 09/07/2018   Procedure: LAPAROSCOPIC CHOLECYSTECTOMY;  Surgeon: Herbert Pun, MD;  Location: [facility] ORS;  Service: General;  Laterality: N/A;  . FINGER SURGERY     pin to 3rd finger Right hand  . LUMBAR LAMINECTOMY/DECOMPRESSION MICRODISCECTOMY  12/15/2011   Procedure: LUMBAR LAMINECTOMY/DECOMPRESSION MICRODISCECTOMY 1 LEVEL;  Surgeon: Ophelia Charter, MD;  Location: [place] [facility];  Service:  Neurosurgery;  Laterality: Right;  RIGHT Lumbar four-Five diskectomy  . MICRODISCECTOMY LUMBAR     L4-5  . WISDOM TOOTH EXTRACTION     Family History  Problem Relation Age of Onset  . GER disease Mother   . Diabetes Father   . Cancer Father        Melanoma, Prostate  . Heart disease Father   . Heart disease Maternal Grandfather   . Diabetes Paternal Grandfather   . Breast cancer Sister 85   Social History   Socioeconomic History  . Marital status: Married    Spouse name: Not on file  . Number of children: 2  . Years of education: Not on file  . Highest education level: Not on file  Occupational History    Employer: [employer] Needs  . Financial resource strain: Not on file  . Food insecurity    Worry: Not on file    Inability: Not on file  . Transportation needs    Medical: Not on file    Non-medical: Not on file  Tobacco Use  . Smoking status: Never Smoker  . Smokeless tobacco: Never Used  Substance and Sexual Activity  . Alcohol use: Yes    Alcohol/week: 0.0 standard drinks    Comment: rare  . Drug use: No  . Sexual activity: Not on file  Lifestyle  . Physical activity    Days per  week: Not on file    Minutes per session: Not on file  . Stress: Not on file  Relationships  . Social Herbalist on phone: Not on file    Gets together: Not on file    Attends religious service: Not on file    Active member of club or organization: Not on file    Attends meetings of clubs or organizations: Not on file    Relationship status: Not on file  Other Topics Concern  . Not on file  Social History Narrative   Nurse    Married    Outpatient Encounter Medications as of 01/08/2019  Medication Sig  . metoprolol succinate (TOPROL-XL) 25 MG 24 hr tablet Take 25 mg by mouth daily.  Marland Kitchen EPINEPHrine 0.3 mg/0.3 mL IJ SOAJ injection Inject 0.3 mg into the muscle as needed for anaphylaxis.   Marland Kitchen glucose blood test strip Use as instructed to check blood  sugars once daily  . Lancets (ONETOUCH ULTRASOFT) lancets Use as instructed to check blood sugars once daily  . metoprolol succinate (TOPROL-XL) 25 MG 24 hr tablet Take 25 mg by mouth daily.  . metoprolol tartrate (LOPRESSOR) 25 MG tablet TAKE 1/2 TABLET BY MOUTH DAILY AS NEEDED   No facility-administered encounter medications on file as of 01/08/2019.    Review of Systems  Constitutional: Negative for appetite change and unexpected weight change.  HENT: Negative for congestion and sinus pressure.   Respiratory: Negative for cough and chest tightness.        Breathing stable.   Cardiovascular: Positive for palpitations. Negative for chest pain.       Some ankle swelling.   Gastrointestinal: Negative for constipation, nausea and vomiting.       Loose stools.    Genitourinary: Negative for difficulty urinating and dysuria.  Musculoskeletal: Negative for joint swelling and myalgias.  Skin: Negative for color change and rash.  Neurological: Negative for dizziness, light-headedness and headaches.  Psychiatric/Behavioral: Negative for agitation and dysphoric mood.       Objective:    Physical Exam Constitutional:      General: She is not in acute distress.    Appearance: Normal appearance.  HENT:     Head: Normocephalic and atraumatic.     Right Ear: External ear normal.     Left Ear: External ear normal.  Eyes:     General: No scleral icterus.       Right eye: No discharge.        Left eye: No discharge.     Conjunctiva/sclera: Conjunctivae normal.  Neck:     Musculoskeletal: Neck supple. No muscular tenderness.     Thyroid: No thyromegaly.  Cardiovascular:     Rate and Rhythm: Normal rate and regular rhythm.  Pulmonary:     Effort: No respiratory distress.     Breath sounds: Normal breath sounds. No wheezing.  Abdominal:     General: Bowel sounds are normal.     Palpations: Abdomen is soft.     Tenderness: There is no abdominal tenderness.  Musculoskeletal:         General: No tenderness.     Comments: Minimal ankle swelling.   Lymphadenopathy:     Cervical: No cervical adenopathy.  Skin:    Findings: No erythema or rash.  Neurological:     Mental Status: She is alert.  Psychiatric:        Mood and Affect: Mood normal.        Behavior:  Behavior normal.     BP (!) 146/78   Pulse 80   Temp (!) 96.4 F (35.8 C)   Resp 16   Wt 250 lb 3.2 oz (113.5 kg)   SpO2 99%   BMI 40.38 kg/m  Wt Readings from Last 3 Encounters:  01/08/19 250 lb 3.2 oz (113.5 kg)  12/07/18 243 lb 6.4 oz (110.4 kg)  09/07/18 235 lb (106.6 kg)     Lab Results  Component Value Date   WBC 8.8 10/12/2018   HGB 14.4 10/12/2018   HCT 42.6 10/12/2018   PLT 248.0 10/12/2018   GLUCOSE 110 (H) 01/08/2019   CHOL 198 10/12/2018   TRIG 96.0 10/12/2018   HDL 54.90 10/12/2018   LDLDIRECT 145.4 03/01/2013   LDLCALC 124 (H) 10/12/2018   ALT 28 10/12/2018   AST 21 10/12/2018   NA 138 01/08/2019   K 4.1 01/08/2019   CL 104 01/08/2019   CREATININE 0.71 01/08/2019   BUN 15 01/08/2019   CO2 26 01/08/2019   TSH 2.91 10/12/2018   HGBA1C 5.8 10/12/2018   MICROALBUR 0.9 05/06/2014    No results found.     Assessment & Plan:   Problem List Items Addressed This Visit    A-fib ([place])    Persistent intermittent afib.  Recently evaluated by cardiology.  holter placed.  Back on metoprolol.  Discussed importance of f/u with cardiology.  Discussed cardioversion (chemical and medical).  Desires no blood thinners.        Relevant Medications   metoprolol succinate (TOPROL-XL) 25 MG 24 hr tablet   Abnormal liver function    Have discussed diet and exercise and need for weight loss.  Follow liver function tests.        BMI 40.0-44.9, adult (HCC)    Discussed diet and exercise.  Follow.        Diarrhea    Describes loose stool s/p cholecystectomy.  Staying hydrated.  Check metabolic panel.        Hypercholesterolemia    Low cholesterol diet and exercise.  Desires not to  take cholesterol medication.  Follow lipid panel.        Relevant Medications   metoprolol succinate (TOPROL-XL) 25 MG 24 hr tablet   Hypertension - Primary    Blood pressure elevated.  Discussed with her today.  Discussed adjusting medication.  Just started on metoprolol.  She desires not to take more medication.  Follow pressures.  Follow metabolic panel.        Relevant Medications   metoprolol succinate (TOPROL-XL) 25 MG 24 hr tablet   Other Relevant Orders   Basic metabolic panel (today) (Completed)   Pedal edema    Some minimal ankle edema.  Discussed compression hose.  Follow.  No evidence of volume overload.        Type 2 diabetes mellitus with hyperglycemia (HCC)    Low carb diet and exercise.  Discussed weight loss.  Follow met b and a1c.           Einar Pheasant, MD

## 2019-01-09 ENCOUNTER — Encounter: Payer: Self-pay | Admitting: Internal Medicine

## 2019-01-09 LAB — BASIC METABOLIC PANEL
BUN: 15 mg/dL (ref 6–23)
CO2: 26 mEq/L (ref 19–32)
Calcium: 9 mg/dL (ref 8.4–10.5)
Chloride: 104 mEq/L (ref 96–112)
Creatinine, Ser: 0.71 mg/dL (ref 0.40–1.20)
GFR: 86.64 mL/min (ref >60.00)
Glucose, Bld: 110 mg/dL — ABNORMAL HIGH (ref 70–99)
Potassium: 4.1 mEq/L (ref 3.5–5.1)
Sodium: 138 mEq/L (ref 135–145)

## 2019-01-13 ENCOUNTER — Encounter: Payer: Self-pay | Admitting: Internal Medicine

## 2019-01-13 NOTE — Assessment & Plan Note (Signed)
Blood pressure elevated.  Discussed with her today.  Discussed adjusting medication.  Just started on metoprolol.  She desires not to take more medication.  Follow pressures.  Follow metabolic panel.

## 2019-01-13 NOTE — Assessment & Plan Note (Signed)
Some minimal ankle edema.  Discussed compression hose.  Follow.  No evidence of volume overload.

## 2019-01-13 NOTE — Assessment & Plan Note (Signed)
Have discussed diet and exercise and need for weight loss.  Follow liver function tests.

## 2019-01-13 NOTE — Assessment & Plan Note (Signed)
Describes loose stool s/p cholecystectomy.  Staying hydrated.  Check metabolic panel.

## 2019-01-13 NOTE — Assessment & Plan Note (Signed)
Low cholesterol diet and exercise.  Desires not to take cholesterol medication.  Follow lipid panel.

## 2019-01-13 NOTE — Assessment & Plan Note (Signed)
Discussed diet and exercise.  Follow.  

## 2019-01-13 NOTE — Assessment & Plan Note (Signed)
Low carb diet and exercise.  Discussed weight loss.  Follow met b and a1c.

## 2019-01-13 NOTE — Assessment & Plan Note (Signed)
Persistent intermittent afib.  Recently evaluated by cardiology.  holter placed.  Back on metoprolol.  Discussed importance of f/u with cardiology.  Discussed cardioversion (chemical and medical).  Desires no blood thinners.

## 2019-01-14 ENCOUNTER — Encounter: Payer: Self-pay | Admitting: Internal Medicine

## 2019-01-22 ENCOUNTER — Encounter: Payer: Self-pay | Admitting: Internal Medicine

## 2019-01-22 ENCOUNTER — Other Ambulatory Visit: Payer: Self-pay

## 2019-01-22 ENCOUNTER — Ambulatory Visit (INDEPENDENT_AMBULATORY_CARE_PROVIDER_SITE_OTHER): Payer: BC Managed Care – PPO | Admitting: Internal Medicine

## 2019-01-22 DIAGNOSIS — R197 Diarrhea, unspecified: Secondary | ICD-10-CM | POA: Diagnosis not present

## 2019-01-22 DIAGNOSIS — R945 Abnormal results of liver function studies: Secondary | ICD-10-CM | POA: Diagnosis not present

## 2019-01-22 DIAGNOSIS — E7800 Pure hypercholesterolemia, unspecified: Secondary | ICD-10-CM | POA: Diagnosis not present

## 2019-01-22 DIAGNOSIS — I4891 Unspecified atrial fibrillation: Secondary | ICD-10-CM | POA: Diagnosis not present

## 2019-01-22 DIAGNOSIS — R1032 Left lower quadrant pain: Secondary | ICD-10-CM

## 2019-01-22 DIAGNOSIS — E1165 Type 2 diabetes mellitus with hyperglycemia: Secondary | ICD-10-CM

## 2019-01-22 DIAGNOSIS — I10 Essential (primary) hypertension: Secondary | ICD-10-CM

## 2019-01-22 MED ORDER — CONTOUR NEXT TEST VI STRP: ORAL_STRIP | 12 refills | Status: DC

## 2019-01-22 MED ORDER — CONTOUR NEXT MONITOR W/DEVICE KIT: 1.0000 | PACK | Freq: Every day | 0 refills | Status: DC | PRN

## 2019-01-22 NOTE — Progress Notes (Signed)
Patient ID: Joy Houston, female   DOB: 04/06/1965, 53 y.o.   MRN: [id number]   Virtual Visit via video Note  This visit type was conducted due to national recommendations for restrictions regarding the COVID-19 pandemic (e.g. social distancing).  This format is felt to be most appropriate for this patient at this time.  All issues noted in this document were discussed and addressed.  No physical exam was performed (except for noted visual exam findings with Video Visits).   I connected with Joy Houston by a video enabled telemedicine application and verified that I am speaking with the correct person using two identifiers. Location patient: home Location provider: work  Persons participating in the virtual visit: patient, provider  I discussed the limitations, risks, security and privacy concerns of performing an evaluation and management service by video and the availability of in person appointments.  The patient expressed understanding and agreed to proceed.   Reason for visit: work in appt  HPI: Work in appt for LLQ pain.  States that she has noticed some increased discomfort LLQ.  States was driving and had to go to bathroom.  Some increased pain.  Cramping across her stomach. Went to the bathroom after she got home.  Used heating pad.  Some discomfort last week.  Feeling better today.  States may have 3 loose stools per day.  This has been occurring since gallbladder removed.   No blood.  No fever.  Improved. Eating.  Started walking some.  Discussed her afib.  No chest pain.  Taking metoprolol regularly now.  Does feel has helped some with afib.  Still persistent intermittent issues.  Desires not to take blood thinner.  Has f/u planned with cardiology 12/16.  Discussed other treatment for the afib, especially given her increased symptoms.  Is feeling some better.  Discussed diet and exercise and weight loss.     ROS: See pertinent positives and negatives per HPI.  Past Medical  History:  Diagnosis Date  . Anemia   . Atrial fibrillation ([place])   . Complication of anesthesia   . Diabetes mellitus without complication (HCC)    diet controlled  . Eczema   . Family history of anesthesia complication    Mother - confusion  . Fatty liver   . Heart murmur    Slight - "nothing to worry about"  . Hemorrhoid   . Hypertension   . PONV (postoperative nausea and vomiting)   . Sleep apnea    mild    Past Surgical History:  Procedure Laterality Date  . CESAREAN SECTION    . CHOLECYSTECTOMY N/A 09/07/2018   Procedure: LAPAROSCOPIC CHOLECYSTECTOMY;  Surgeon: Herbert Pun, MD;  Location: [facility] ORS;  Service: General;  Laterality: N/A;  . FINGER SURGERY     pin to 3rd finger Right hand  . LUMBAR LAMINECTOMY/DECOMPRESSION MICRODISCECTOMY  12/15/2011   Procedure: LUMBAR LAMINECTOMY/DECOMPRESSION MICRODISCECTOMY 1 LEVEL;  Surgeon: Ophelia Charter, MD;  Location: [place] [facility];  Service: Neurosurgery;  Laterality: Right;  RIGHT Lumbar four-Five diskectomy  . MICRODISCECTOMY LUMBAR     L4-5  . WISDOM TOOTH EXTRACTION      Family History  Problem Relation Age of Onset  . GER disease Mother   . Diabetes Father   . Cancer Father        Melanoma, Prostate  . Heart disease Father   . Heart disease Maternal Grandfather   . Diabetes Paternal Grandfather   . Breast cancer Sister 1    SOCIAL  HX: reviewed.    Current Outpatient Medications:  .  Cholecalciferol (VITAMIN D3) 10 MCG (400 UNIT) tablet, Take 400 Units by mouth daily., Disp: , Rfl:  .  EPINEPHrine 0.3 mg/0.3 mL IJ SOAJ injection, Inject 0.3 mg into the muscle as needed for anaphylaxis. , Disp: , Rfl:  .  Lancets (ONETOUCH ULTRASOFT) lancets, Use as instructed to check blood sugars once daily, Disp: 100 each, Rfl: 12 .  metoprolol succinate (TOPROL-XL) 25 MG 24 hr tablet, Take 25 mg by mouth daily., Disp: , Rfl:  .  Blood Glucose Monitoring Suppl (CONTOUR NEXT MONITOR) w/Device KIT, 1 Device by Does not  apply route daily as needed., Disp: 1 kit, Rfl: 0 .  glucose blood (CONTOUR NEXT TEST) test strip, Use as instructed, Disp: 100 each, Rfl: 12 .  metoprolol succinate (TOPROL-XL) 25 MG 24 hr tablet, Take 25 mg by mouth daily., Disp: , Rfl:  .  metoprolol tartrate (LOPRESSOR) 25 MG tablet, TAKE 1/2 TABLET BY MOUTH DAILY AS NEEDED (Patient not taking: Reported on 01/22/2019), Disp: 90 tablet, Rfl: 1  EXAM:  GENERAL: alert, oriented, appears well and in no acute distress  HEENT: atraumatic, conjunttiva clear, no obvious abnormalities on inspection of external nose and ears  NECK: normal movements of the head and neck  LUNGS: on inspection no signs of respiratory distress, breathing rate appears normal, no obvious gross SOB, gasping or wheezing  CV: no obvious cyanosis  PSYCH/NEURO: pleasant and cooperative, no obvious depression or anxiety, speech and thought processing grossly intact  ASSESSMENT AND PLAN:  Discussed the following assessment and plan:  Abnormal liver function Diet, exercise and weight loss.  Follow liver function tests.    A-fib (HCC) Persistent intermittent afib.  Followed by cardiology. Does appear to be doing some better on metoprolol.  Has f/u 02/06/19 with cardiology.  Discussed other treatment options given her increased symptoms, etc.    Diarrhea Some persistent loose stool s/p cholecystectomy.  Follow.    Hypercholesterolemia Low cholesterol diet and exercise.  Does not want to take cholesterol medication.  Follow lipid panel.   Hypertension Follow pressures.  Elevated last office visit.    Type 2 diabetes mellitus with hyperglycemia (HCC) Low carb diet and exercise.  Follow met b and a1c.  Discussed diet and exercise today.   Left lower quadrant pain Is better.  Discussed possible etiologies.  Discussed possible diverticulitis.  She was concerned regarding possible ovary origin.  Discussed further evaluation with CT scan/pelvic ultrasound. She declines.   Is better.  Request soon f/u to reassess.  Will notify me if symptoms worsen or persist.  Overall feels better.      I discussed the assessment and treatment plan with the patient. The patient was provided an opportunity to ask questions and all were answered. The patient agreed with the plan and demonstrated an understanding of the instructions.   The patient was advised to call back or seek an in-person evaluation if the symptoms worsen or if the condition fails to improve as anticipated.  I provided 45 minutes of non-face-to-face time during this encounter.   Einar Pheasant, MD

## 2019-01-27 ENCOUNTER — Encounter: Payer: Self-pay | Admitting: Internal Medicine

## 2019-01-27 DIAGNOSIS — R1032 Left lower quadrant pain: Secondary | ICD-10-CM | POA: Insufficient documentation

## 2019-01-27 NOTE — Assessment & Plan Note (Signed)
Low cholesterol diet and exercise.  Does not want to take cholesterol medication.  Follow lipid panel.

## 2019-01-27 NOTE — Assessment & Plan Note (Signed)
Persistent intermittent afib.  Followed by cardiology. Does appear to be doing some better on metoprolol.  Has f/u 02/06/19 with cardiology.  Discussed other treatment options given her increased symptoms, etc.

## 2019-01-27 NOTE — Assessment & Plan Note (Signed)
Low carb diet and exercise.  Follow met b and a1c.  Discussed diet and exercise today.   

## 2019-01-27 NOTE — Assessment & Plan Note (Signed)
Follow pressures.  Elevated last office visit.

## 2019-01-27 NOTE — Assessment & Plan Note (Signed)
Is better.  Discussed possible etiologies.  Discussed possible diverticulitis.  She was concerned regarding possible ovary origin.  Discussed further evaluation with CT scan/pelvic ultrasound. She declines.  Is better.  Request soon f/u to reassess.  Will notify me if symptoms worsen or persist.  Overall feels better.

## 2019-01-27 NOTE — Assessment & Plan Note (Signed)
Some persistent loose stool s/p cholecystectomy.  Follow.

## 2019-01-27 NOTE — Assessment & Plan Note (Signed)
Diet, exercise and weight loss. Follow liver function tests.   

## 2019-01-29 ENCOUNTER — Other Ambulatory Visit: Payer: Self-pay | Admitting: Internal Medicine

## 2019-01-29 DIAGNOSIS — Z1231 Encounter for screening mammogram for malignant neoplasm of breast: Secondary | ICD-10-CM

## 2019-02-05 ENCOUNTER — Ambulatory Visit
Admission: RE | Admit: 2019-02-05 | Discharge: 2019-02-05 | Disposition: A | Discharge status: Home / Self Care | Payer: BC Managed Care – PPO | Source: Ambulatory Visit | Attending: Internal Medicine | Admitting: Internal Medicine

## 2019-02-05 DIAGNOSIS — Z1231 Encounter for screening mammogram for malignant neoplasm of breast: Secondary | ICD-10-CM | POA: Diagnosis not present

## 2019-02-06 DIAGNOSIS — G4733 Obstructive sleep apnea (adult) (pediatric): Secondary | ICD-10-CM | POA: Diagnosis not present

## 2019-02-06 DIAGNOSIS — I480 Paroxysmal atrial fibrillation: Secondary | ICD-10-CM | POA: Diagnosis not present

## 2019-02-06 DIAGNOSIS — I10 Essential (primary) hypertension: Secondary | ICD-10-CM | POA: Diagnosis not present

## 2019-02-07 ENCOUNTER — Encounter: Payer: Self-pay | Admitting: Internal Medicine

## 2019-02-12 ENCOUNTER — Ambulatory Visit (INDEPENDENT_AMBULATORY_CARE_PROVIDER_SITE_OTHER): Payer: BC Managed Care – PPO | Admitting: Internal Medicine

## 2019-02-12 ENCOUNTER — Encounter: Payer: Self-pay | Admitting: Internal Medicine

## 2019-02-12 ENCOUNTER — Ambulatory Visit: Payer: BC Managed Care – PPO | Admitting: Internal Medicine

## 2019-02-12 ENCOUNTER — Other Ambulatory Visit: Payer: Self-pay

## 2019-02-12 DIAGNOSIS — G4730 Sleep apnea, unspecified: Secondary | ICD-10-CM

## 2019-02-12 DIAGNOSIS — R197 Diarrhea, unspecified: Secondary | ICD-10-CM

## 2019-02-12 DIAGNOSIS — I10 Essential (primary) hypertension: Secondary | ICD-10-CM

## 2019-02-12 DIAGNOSIS — R1032 Left lower quadrant pain: Secondary | ICD-10-CM

## 2019-02-12 DIAGNOSIS — I4891 Unspecified atrial fibrillation: Secondary | ICD-10-CM

## 2019-02-12 DIAGNOSIS — E1165 Type 2 diabetes mellitus with hyperglycemia: Secondary | ICD-10-CM

## 2019-02-12 DIAGNOSIS — R945 Abnormal results of liver function studies: Secondary | ICD-10-CM | POA: Diagnosis not present

## 2019-02-12 DIAGNOSIS — E7800 Pure hypercholesterolemia, unspecified: Secondary | ICD-10-CM | POA: Diagnosis not present

## 2019-02-12 NOTE — Progress Notes (Signed)
Patient ID: Joy Houston, female   DOB: [birth year], 53 y.o.   MRN: [id number]   Virtual Visit via video Note  This visit type was conducted due to national recommendations for restrictions regarding the COVID-19 pandemic (e.g. social distancing).  This format is felt to be most appropriate for this patient at this time.  All issues noted in this document were discussed and addressed.  No physical exam was performed (except for noted visual exam findings with Video Visits).   I connected with Truman Hayward by a video enabled telemedicine application and verified that I am speaking with the correct person using two identifiers. Location patient: home Location provider: work Persons participating in the virtual visit: patient, provider  I discussed the limitations, risks, security and privacy concerns of performing an evaluation and management service by video and the availability of in person appointments. The patient expressed understanding and agreed to proceed.   Reason for visit: scheduled follow up.   HPI: Saw Dr Nehemiah Massed 02/06/19 - for f/u paroxysmal afib, hypertension and OSA.  Note reviewed.  Recommended continued treatment of sleep apnea (which she is doing).  He discussed ablation therapy and antiarrhythmic medications.  She desires to continue on metoprolol for now.  She still has intermittent episodes - takes her breath away.  Does not feel "quite right" at times.  Worse in evening.  Blood pressure still varying.  No nausea or vomiting reported.  The previous LLQ pain - resolved.  Bowels stable.     ROS: See pertinent positives and negatives per HPI.  Past Medical History:  Diagnosis Date  . Anemia   . Atrial fibrillation ([place])   . Complication of anesthesia   . Diabetes mellitus without complication (HCC)    diet controlled  . Eczema   . Family history of anesthesia complication    Mother - confusion  . Fatty liver   . Heart murmur    Slight - "nothing to worry  about"  . Hemorrhoid   . Hypertension   . PONV (postoperative nausea and vomiting)   . Sleep apnea    mild    Past Surgical History:  Procedure Laterality Date  . CESAREAN SECTION    . CHOLECYSTECTOMY N/A 09/07/2018   Procedure: LAPAROSCOPIC CHOLECYSTECTOMY;  Surgeon: Herbert Pun, MD;  Location: [facility] ORS;  Service: General;  Laterality: N/A;  . FINGER SURGERY     pin to 3rd finger Right hand  . LUMBAR LAMINECTOMY/DECOMPRESSION MICRODISCECTOMY  12/15/2011   Procedure: LUMBAR LAMINECTOMY/DECOMPRESSION MICRODISCECTOMY 1 LEVEL;  Surgeon: Ophelia Charter, MD;  Location: [place] [facility];  Service: Neurosurgery;  Laterality: Right;  RIGHT Lumbar four-Five diskectomy  . MICRODISCECTOMY LUMBAR     L4-5  . WISDOM TOOTH EXTRACTION      Family History  Problem Relation Age of Onset  . GER disease Mother   . Diabetes Father   . Cancer Father        Melanoma, Prostate  . Heart disease Father   . Heart disease Maternal Grandfather   . Diabetes Paternal Grandfather   . Breast cancer Sister 34    SOCIAL HX: reviewed.    Current Outpatient Medications:  .  Blood Glucose Monitoring Suppl (CONTOUR NEXT MONITOR) w/Device KIT, 1 Device by Does not apply route daily as needed., Disp: 1 kit, Rfl: 0 .  Cholecalciferol (VITAMIN D3) 10 MCG (400 UNIT) tablet, Take 400 Units by mouth daily., Disp: , Rfl:  .  EPINEPHrine 0.3 mg/0.3 mL IJ SOAJ injection, Inject 0.3  mg into the muscle as needed for anaphylaxis. , Disp: , Rfl:  .  glucose blood (CONTOUR NEXT TEST) test strip, Use as instructed, Disp: 100 each, Rfl: 12 .  Lancets (ONETOUCH ULTRASOFT) lancets, Use as instructed to check blood sugars once daily, Disp: 100 each, Rfl: 12 .  metoprolol succinate (TOPROL-XL) 25 MG 24 hr tablet, Take 25 mg by mouth daily., Disp: , Rfl:  .  metoprolol succinate (TOPROL-XL) 25 MG 24 hr tablet, Take 25 mg by mouth daily., Disp: , Rfl:  .  metoprolol tartrate (LOPRESSOR) 25 MG tablet, TAKE 1/2 TABLET BY  MOUTH DAILY AS NEEDED (Patient not taking: Reported on 01/22/2019), Disp: 90 tablet, Rfl: 1  EXAM:  GENERAL: alert, oriented, appears well and in no acute distress  HEENT: atraumatic, conjunttiva clear, no obvious abnormalities on inspection of external nose and ears  NECK: normal movements of the head and neck  LUNGS: on inspection no signs of respiratory distress, breathing rate appears normal, no obvious gross SOB, gasping or wheezing  CV: no obvious cyanosis  PSYCH/NEURO: pleasant and cooperative, no obvious depression or anxiety, speech and thought processing grossly intact  ASSESSMENT AND PLAN:  Discussed the following assessment and plan:  Abnormal liver function Continue diet, exercise and weight loss.  Last liver panel wnl.    A-fib (HCC) Persistent intermittent afib as outlined.  Symptomatic with afib.  Seeing cardiology.  Just evaluated 02/06/19.  Remains on metoprolol.  Discussed ablation therapy and antiarrhythmic medications.  Elects to continue to monitor.    Diarrhea Persistent loose stool - s/p cholecystectomy.  Follow.   Hypercholesterolemia Low cholesterol diet and exercise.  Has declined cholesterol medication.  Follow lipid panel.    Hypertension Blood pressure as outlined.  Desires no further medication.  Elected to follow.  Continue metoprolol.    Left lower quadrant pain No pain now.  Follow.    Sleep apnea Improved with her current treatment.  Follow. Improved.   Type 2 diabetes mellitus with hyperglycemia (HCC) Low carb diet and exercise. Follow met b and a1c.     I discussed the assessment and treatment plan with the patient. The patient was provided an opportunity to ask questions and all were answered. The patient agreed with the plan and demonstrated an understanding of the instructions.   The patient was advised to call back or seek an in-person evaluation if the symptoms worsen or if the condition fails to improve as anticipated.    Einar Pheasant, MD

## 2019-02-17 ENCOUNTER — Encounter: Payer: Self-pay | Admitting: Internal Medicine

## 2019-02-17 NOTE — Assessment & Plan Note (Signed)
Low carb diet and exercise. Follow met b and a1c.  

## 2019-02-17 NOTE — Assessment & Plan Note (Signed)
Continue diet, exercise and weight loss.  Last liver panel wnl.

## 2019-02-17 NOTE — Assessment & Plan Note (Signed)
Persistent loose stool - s/p cholecystectomy.  Follow.

## 2019-02-17 NOTE — Assessment & Plan Note (Signed)
Low cholesterol diet and exercise.  Has declined cholesterol medication.  Follow lipid panel.

## 2019-02-17 NOTE — Assessment & Plan Note (Signed)
Persistent intermittent afib as outlined.  Symptomatic with afib.  Seeing cardiology.  Just evaluated 02/06/19.  Remains on metoprolol.  Discussed ablation therapy and antiarrhythmic medications.  Elects to continue to monitor.

## 2019-02-17 NOTE — Assessment & Plan Note (Signed)
No pain now.  Follow.   

## 2019-02-17 NOTE — Assessment & Plan Note (Signed)
Improved with her current treatment.  Follow. Improved.

## 2019-02-17 NOTE — Assessment & Plan Note (Signed)
Blood pressure as outlined.  Desires no further medication.  Elected to follow.  Continue metoprolol.

## 2019-03-04 DIAGNOSIS — J3489 Other specified disorders of nose and nasal sinuses: Secondary | ICD-10-CM | POA: Diagnosis not present

## 2019-03-04 DIAGNOSIS — J330 Polyp of nasal cavity: Secondary | ICD-10-CM | POA: Diagnosis not present

## 2019-03-04 DIAGNOSIS — T783XXD Angioneurotic edema, subsequent encounter: Secondary | ICD-10-CM | POA: Diagnosis not present

## 2019-03-18 ENCOUNTER — Encounter: Payer: Self-pay | Admitting: Internal Medicine

## 2019-03-18 DIAGNOSIS — I4891 Unspecified atrial fibrillation: Secondary | ICD-10-CM

## 2019-03-18 DIAGNOSIS — I10 Essential (primary) hypertension: Secondary | ICD-10-CM

## 2019-03-18 DIAGNOSIS — R945 Abnormal results of liver function studies: Secondary | ICD-10-CM

## 2019-03-18 DIAGNOSIS — E7800 Pure hypercholesterolemia, unspecified: Secondary | ICD-10-CM

## 2019-03-18 DIAGNOSIS — E1165 Type 2 diabetes mellitus with hyperglycemia: Secondary | ICD-10-CM

## 2019-03-18 DIAGNOSIS — K760 Fatty (change of) liver, not elsewhere classified: Secondary | ICD-10-CM

## 2019-03-19 NOTE — Telephone Encounter (Signed)
I have placed the order for the labs.  Please schedule her for a fasting lab appt.  Also, I can get ashley to schedule a f/u appt with me, but if she is having persistent diarrhea, I do recommend an appt with GI.  If agreeable, let me know and I will place order for referral.

## 2019-03-19 NOTE — Telephone Encounter (Signed)
Ok to see in office if passes screening.  Will need to get with me to schedule.

## 2019-03-20 NOTE — Telephone Encounter (Signed)
Patient was advised to be here at 1:00

## 2019-03-20 NOTE — Telephone Encounter (Signed)
Pt is on the schedule for tomorrow. Do I need to do anything? She has been screened.

## 2019-03-20 NOTE — Telephone Encounter (Signed)
Discussed with you- scheduling.  Please notify pt.

## 2019-03-21 ENCOUNTER — Other Ambulatory Visit: Payer: Self-pay

## 2019-03-21 ENCOUNTER — Ambulatory Visit: Payer: BC Managed Care – PPO | Admitting: Internal Medicine

## 2019-03-21 ENCOUNTER — Encounter: Payer: Self-pay | Admitting: Internal Medicine

## 2019-03-21 VITALS — BP 130/76 | HR 85 | Temp 97.1°F | Resp 16 | Wt 240.6 lb

## 2019-03-21 DIAGNOSIS — R945 Abnormal results of liver function studies: Secondary | ICD-10-CM

## 2019-03-21 DIAGNOSIS — R197 Diarrhea, unspecified: Secondary | ICD-10-CM

## 2019-03-21 DIAGNOSIS — E1165 Type 2 diabetes mellitus with hyperglycemia: Secondary | ICD-10-CM

## 2019-03-21 DIAGNOSIS — E782 Mixed hyperlipidemia: Secondary | ICD-10-CM | POA: Diagnosis not present

## 2019-03-21 DIAGNOSIS — I10 Essential (primary) hypertension: Secondary | ICD-10-CM

## 2019-03-21 DIAGNOSIS — I480 Paroxysmal atrial fibrillation: Secondary | ICD-10-CM

## 2019-03-21 NOTE — Progress Notes (Addendum)
Patient ID: Joy Houston, female   DOB: Jul 04, 1965, 54 y.o.   MRN: [id number]   Subjective:    Patient ID: Joy Houston, female    DOB: 1965-09-19, 54 y.o.   MRN: [id number]  HPI  This visit occurred during the SARS-CoV-2 public health emergency.  Safety protocols were in place, including screening questions prior to the visit, additional usage of staff PPE, and extensive cleaning of exam room while observing appropriate contact time as indicated for disinfecting solutions.  Patient here for a scheduled follow up.  Followed by Dr Nehemiah Massed for afib, OSA and hypertension.  She continues to have issues with intermittent increased heart rate - afib.  Is concerned that her persistent issue with loose stool is contributing.  Discussed checking electrolytes, vitamin D, etc.  Symptomatic when has afib episodes.  Has adjusted her diet.  Lost some weight.  Blood pressure appears to be doing better.  Also questioning if exposure at work to mold, sewage gas, etc - could be contributing.  She is going to be physically away from the building for a while and plans to monitor for any improvement in symptoms.  Eating. No vomiting.  No significant abdominal pain.  Not checking sugars.  Needs test strips.    Past Medical History:  Diagnosis Date  . Anemia   . Atrial fibrillation ([place])   . Complication of anesthesia   . Diabetes mellitus without complication (HCC)    diet controlled  . Eczema   . Family history of anesthesia complication    Mother - confusion  . Fatty liver   . Heart murmur    Slight - "nothing to worry about"  . Hemorrhoid   . Hypertension   . PONV (postoperative nausea and vomiting)   . Sleep apnea    mild   Past Surgical History:  Procedure Laterality Date  . CESAREAN SECTION    . CHOLECYSTECTOMY N/A 09/07/2018   Procedure: LAPAROSCOPIC CHOLECYSTECTOMY;  Surgeon: Herbert Pun, MD;  Location: [facility] ORS;  Service: General;  Laterality: N/A;  . FINGER SURGERY     pin  to 3rd finger Right hand  . LUMBAR LAMINECTOMY/DECOMPRESSION MICRODISCECTOMY  12/15/2011   Procedure: LUMBAR LAMINECTOMY/DECOMPRESSION MICRODISCECTOMY 1 LEVEL;  Surgeon: Ophelia Charter, MD;  Location: [place] [facility];  Service: Neurosurgery;  Laterality: Right;  RIGHT Lumbar four-Five diskectomy  . MICRODISCECTOMY LUMBAR     L4-5  . WISDOM TOOTH EXTRACTION     Family History  Problem Relation Age of Onset  . GER disease Mother   . Diabetes Father   . Cancer Father        Melanoma, Prostate  . Heart disease Father   . Heart disease Maternal Grandfather   . Diabetes Paternal Grandfather   . Breast cancer Sister 60   Social History   Socioeconomic History  . Marital status: Married    Spouse name: Not on file  . Number of children: 2  . Years of education: Not on file  . Highest education level: Not on file  Occupational History    Employer: [employer]  Tobacco Use  . Smoking status: Never Smoker  . Smokeless tobacco: Never Used  Substance and Sexual Activity  . Alcohol use: Yes    Alcohol/week: 0.0 standard drinks    Comment: rare  . Drug use: No  . Sexual activity: Not on file  Other Topics Concern  . Not on file  Social History Narrative   Nurse    Married  Social Determinants of Health   Financial Resource Strain:   . Difficulty of Paying Living Expenses: Not on file  Food Insecurity:   . Worried About Charity fundraiser in the Last Year: Not on file  . Ran Out of Food in the Last Year: Not on file  Transportation Needs:   . Lack of Transportation (Medical): Not on file  . Lack of Transportation (Non-Medical): Not on file  Physical Activity:   . Days of Exercise per Week: Not on file  . Minutes of Exercise per Session: Not on file  Stress:   . Feeling of Stress : Not on file  Social Connections:   . Frequency of Communication with Friends and Family: Not on file  . Frequency of Social Gatherings with Friends and Family: Not on file  . Attends  Religious Services: Not on file  . Active Member of Clubs or Organizations: Not on file  . Attends Archivist Meetings: Not on file  . Marital Status: Not on file    Outpatient Encounter Medications as of 03/21/2019  Medication Sig  . Blood Glucose Monitoring Suppl (CONTOUR NEXT MONITOR) w/Device KIT 1 Device by Does not apply route daily as needed.  . Cholecalciferol (VITAMIN D3) 10 MCG (400 UNIT) tablet Take 400 Units by mouth daily.  Marland Kitchen EPINEPHrine 0.3 mg/0.3 mL IJ SOAJ injection Inject 0.3 mg into the muscle as needed for anaphylaxis.   Marland Kitchen glucose blood (CONTOUR NEXT TEST) test strip Use as instructed  . Lancets (ONETOUCH ULTRASOFT) lancets Use as instructed to check blood sugars once daily  . metoprolol succinate (TOPROL-XL) 25 MG 24 hr tablet Take 25 mg by mouth daily.  . metoprolol succinate (TOPROL-XL) 25 MG 24 hr tablet Take 25 mg by mouth daily.   No facility-administered encounter medications on file as of 03/21/2019.   Review of Systems  Constitutional: Negative for appetite change and unexpected weight change.  HENT: Negative for congestion and sinus pressure.   Respiratory: Negative for cough and chest tightness.        Breathing overall stable.   Cardiovascular: Negative for chest pain.       No increased leg swelling.  Increased palpitations and heart racing as outlined.  Intermittent.   Gastrointestinal: Positive for diarrhea. Negative for abdominal pain, nausea and vomiting.  Genitourinary: Negative for difficulty urinating and dysuria.  Musculoskeletal: Negative for joint swelling and myalgias.  Skin: Negative for color change and rash.  Neurological: Negative for dizziness, light-headedness and headaches.  Psychiatric/Behavioral: Negative for agitation and dysphoric mood.       Objective:    Physical Exam Constitutional:      General: She is not in acute distress.    Appearance: Normal appearance.  HENT:     Head: Normocephalic and atraumatic.      Right Ear: External ear normal.     Left Ear: External ear normal.  Eyes:     General: No scleral icterus.       Right eye: No discharge.        Left eye: No discharge.     Conjunctiva/sclera: Conjunctivae normal.  Neck:     Thyroid: No thyromegaly.  Cardiovascular:     Rate and Rhythm: Normal rate and regular rhythm.  Pulmonary:     Effort: No respiratory distress.     Breath sounds: Normal breath sounds. No wheezing.  Abdominal:     General: Bowel sounds are normal.     Palpations: Abdomen is soft.  Tenderness: There is no abdominal tenderness.  Musculoskeletal:        General: No swelling or tenderness.     Cervical back: Neck supple. No tenderness.  Lymphadenopathy:     Cervical: No cervical adenopathy.  Skin:    Findings: No erythema or rash.  Neurological:     Mental Status: She is alert.  Psychiatric:        Mood and Affect: Mood normal.        Behavior: Behavior normal.     BP 130/76   Pulse 85   Temp (!) 97.1 F (36.2 C)   Resp 16   Wt 240 lb 9.6 oz (109.1 kg)   SpO2 98%   BMI 38.83 kg/m  Wt Readings from Last 3 Encounters:  03/21/19 240 lb 9.6 oz (109.1 kg)  01/22/19 250 lb (113.4 kg)  01/08/19 250 lb 3.2 oz (113.5 kg)     Lab Results  Component Value Date   WBC 8.8 10/12/2018   HGB 14.4 10/12/2018   HCT 42.6 10/12/2018   PLT 248.0 10/12/2018   GLUCOSE 110 (H) 01/08/2019   CHOL 198 10/12/2018   TRIG 96.0 10/12/2018   HDL 54.90 10/12/2018   LDLDIRECT 145.4 03/01/2013   LDLCALC 124 (H) 10/12/2018   ALT 28 10/12/2018   AST 21 10/12/2018   NA 138 01/08/2019   K 4.1 01/08/2019   CL 104 01/08/2019   CREATININE 0.71 01/08/2019   BUN 15 01/08/2019   CO2 26 01/08/2019   TSH 2.91 10/12/2018   HGBA1C 5.8 10/12/2018   MICROALBUR 0.9 05/06/2014    MM 3D SCREEN BREAST BILATERAL  Result Date: 02/05/2019 CLINICAL DATA:  Screening. EXAM: DIGITAL SCREENING BILATERAL MAMMOGRAM WITH TOMO AND CAD COMPARISON:  Previous exam(s). ACR Breast Density  Category b: There are scattered areas of fibroglandular density. FINDINGS: There are no findings suspicious for malignancy. Images were processed with CAD. IMPRESSION: No mammographic evidence of malignancy. A result letter of this screening mammogram will be mailed directly to the patient. RECOMMENDATION: Screening mammogram in one year. (Code:SM-B-01Y) BI-RADS CATEGORY  1: Negative. Electronically Signed   By: Ammie Ferrier M.D.   On: 02/05/2019 16:43       Assessment & Plan:   Problem List Items Addressed This Visit    Abnormal liver function    Diet, exercise and weight loss.  Follow liver function tests.        Diarrhea - Primary    Persistent loose stools.  May be related to gallbladder removal.  Need to confirm no other etiology.  Check GI pathogen panel.        Relevant Orders   Gastrointestinal Pathogen Panel PCR   Vitamin B12   Hyperlipidemia, mixed    Low cholesterol diet and exercise.  Follow lipid panel.       Hypertension    Blood pressure improved.  Follow pressures.  Follow metabolic panel.  Continue current medication.       Paroxysmal A-fib (HCC)    Persistent intermittent afib as outlined.  Seeing cardiology.  Dr Nehemiah Massed has discussed chemical cardioversion and medication.  She has wanted to follow.  See if can confirm if any other etiology to the persistent intermittent episodes.  Check stool for any infection.  Check electrolytes.  Continue metoprolol.       Type 2 diabetes mellitus with hyperglycemia (HCC)    Low carb diet and exercise.  Follow met b and a1c  Dusan Lipford, MD 

## 2019-03-24 ENCOUNTER — Encounter: Payer: Self-pay | Admitting: Internal Medicine

## 2019-03-24 NOTE — Assessment & Plan Note (Signed)
Persistent intermittent afib as outlined.  Seeing cardiology.  Dr Nehemiah Massed has discussed chemical cardioversion and medication.  She has wanted to follow.  See if can confirm if any other etiology to the persistent intermittent episodes.  Check stool for any infection.  Check electrolytes.  Continue metoprolol.

## 2019-03-24 NOTE — Assessment & Plan Note (Signed)
Persistent loose stools.  May be related to gallbladder removal.  Need to confirm no other etiology.  Check GI pathogen panel.

## 2019-03-24 NOTE — Assessment & Plan Note (Signed)
Diet, exercise and weight loss. Follow liver function tests.   

## 2019-03-24 NOTE — Assessment & Plan Note (Signed)
Low cholesterol diet and exercise.  Follow lipid panel.   

## 2019-03-24 NOTE — Assessment & Plan Note (Signed)
Blood pressure improved.  Follow pressures.  Follow metabolic panel.  Continue current medication.

## 2019-03-24 NOTE — Assessment & Plan Note (Signed)
Low carb diet and exercise.  Follow met b and a1c.   

## 2019-03-25 ENCOUNTER — Other Ambulatory Visit (INDEPENDENT_AMBULATORY_CARE_PROVIDER_SITE_OTHER): Payer: BC Managed Care – PPO

## 2019-03-25 ENCOUNTER — Other Ambulatory Visit: Payer: BC Managed Care – PPO

## 2019-03-25 ENCOUNTER — Other Ambulatory Visit: Payer: Self-pay

## 2019-03-25 DIAGNOSIS — I10 Essential (primary) hypertension: Secondary | ICD-10-CM

## 2019-03-25 DIAGNOSIS — E7800 Pure hypercholesterolemia, unspecified: Secondary | ICD-10-CM | POA: Diagnosis not present

## 2019-03-25 DIAGNOSIS — R197 Diarrhea, unspecified: Secondary | ICD-10-CM

## 2019-03-25 DIAGNOSIS — E1165 Type 2 diabetes mellitus with hyperglycemia: Secondary | ICD-10-CM

## 2019-03-25 DIAGNOSIS — R945 Abnormal results of liver function studies: Secondary | ICD-10-CM

## 2019-03-25 DIAGNOSIS — I4891 Unspecified atrial fibrillation: Secondary | ICD-10-CM | POA: Diagnosis not present

## 2019-03-25 LAB — HEPATIC FUNCTION PANEL
ALT: 28 U/L (ref 0–35)
AST: 18 U/L (ref 0–37)
Albumin: 4.6 g/dL (ref 3.5–5.2)
Alkaline Phosphatase: 80 U/L (ref 39–117)
Bilirubin, Direct: 0.2 mg/dL (ref 0.0–0.3)
Total Bilirubin: 0.7 mg/dL (ref 0.2–1.2)
Total Protein: 7.8 g/dL (ref 6.0–8.3)

## 2019-03-25 LAB — CBC WITH DIFFERENTIAL/PLATELET
Basophils Absolute: 0.1 10*3/uL (ref 0.0–0.1)
Basophils Relative: 0.6 % (ref 0.0–3.0)
Eosinophils Absolute: 0.1 10*3/uL (ref 0.0–0.7)
Eosinophils Relative: 0.6 % (ref 0.0–5.0)
HCT: 48 % — ABNORMAL HIGH (ref 36.0–46.0)
Hemoglobin: 16.2 g/dL — ABNORMAL HIGH (ref 12.0–15.0)
Lymphocytes Relative: 21.5 % (ref 12.0–46.0)
Lymphs Abs: 2 10*3/uL (ref 0.7–4.0)
MCHC: 33.8 g/dL (ref 30.0–36.0)
MCV: 92.1 fl (ref 78.0–100.0)
Monocytes Absolute: 0.6 10*3/uL (ref 0.1–1.0)
Monocytes Relative: 6.8 % (ref 3.0–12.0)
Neutro Abs: 6.6 10*3/uL (ref 1.4–7.7)
Neutrophils Relative %: 70.5 % (ref 43.0–77.0)
Platelets: 262 10*3/uL (ref 150.0–400.0)
RBC: 5.21 Mil/uL — ABNORMAL HIGH (ref 3.87–5.11)
RDW: 13.4 % (ref 11.5–15.5)
WBC: 9.4 10*3/uL (ref 4.0–10.5)

## 2019-03-25 LAB — BASIC METABOLIC PANEL
BUN: 13 mg/dL (ref 6–23)
CO2: 25 mEq/L (ref 19–32)
Calcium: 10.2 mg/dL (ref 8.4–10.5)
Chloride: 102 mEq/L (ref 96–112)
Creatinine, Ser: 0.82 mg/dL (ref 0.40–1.20)
GFR: 73.31 mL/min (ref >60.00)
Glucose, Bld: 99 mg/dL (ref 70–99)
Potassium: 4.4 mEq/L (ref 3.5–5.1)
Sodium: 139 mEq/L (ref 135–145)

## 2019-03-25 LAB — LIPID PANEL
Cholesterol: 187 mg/dL (ref 0–200)
HDL: 54.1 mg/dL (ref >39.00)
LDL Cholesterol: 114 mg/dL — ABNORMAL HIGH (ref 0–99)
NonHDL: 132.99
Total CHOL/HDL Ratio: 3
Triglycerides: 93 mg/dL (ref 0.0–149.0)
VLDL: 18.6 mg/dL (ref 0.0–40.0)

## 2019-03-25 LAB — VITAMIN D 25 HYDROXY (VIT D DEFICIENCY, FRACTURES): VITD: 22.54 ng/mL — ABNORMAL LOW (ref 30.00–100.00)

## 2019-03-25 LAB — TSH: TSH: 3.26 u[IU]/mL (ref 0.35–4.50)

## 2019-03-25 LAB — VITAMIN B12: Vitamin B-12: 198 pg/mL — ABNORMAL LOW (ref 211–911)

## 2019-03-25 LAB — MAGNESIUM: Magnesium: 2 mg/dL (ref 1.5–2.5)

## 2019-03-25 LAB — HEMOGLOBIN A1C: Hgb A1c MFr Bld: 5.6 % (ref 4.6–6.5)

## 2019-03-25 NOTE — Addendum Note (Signed)
Addended by: Leeanne Rio on: 03/25/2019 08:39 AM   Modules accepted: Orders

## 2019-03-26 ENCOUNTER — Encounter: Payer: Self-pay | Admitting: Internal Medicine

## 2019-03-26 ENCOUNTER — Ambulatory Visit (INDEPENDENT_AMBULATORY_CARE_PROVIDER_SITE_OTHER): Payer: BC Managed Care – PPO

## 2019-03-26 ENCOUNTER — Telehealth: Payer: Self-pay | Admitting: Internal Medicine

## 2019-03-26 ENCOUNTER — Other Ambulatory Visit: Payer: Self-pay

## 2019-03-26 VITALS — Temp 96.7°F

## 2019-03-26 DIAGNOSIS — E538 Deficiency of other specified B group vitamins: Secondary | ICD-10-CM | POA: Diagnosis not present

## 2019-03-26 MED ORDER — CYANOCOBALAMIN 1000 MCG/ML IJ SOLN
1000.0000 ug | Freq: Once | INTRAMUSCULAR | Status: AC
  Administered 2019-03-26: 1000 ug via INTRAMUSCULAR

## 2019-03-26 NOTE — Telephone Encounter (Signed)
See result note.  I have addressed the labs already and gave orders.

## 2019-03-26 NOTE — Telephone Encounter (Signed)
Pt noticed from [place] that her B12 is low and would like to get an injection today. Please call pt back to advise

## 2019-03-26 NOTE — Progress Notes (Addendum)
Joy Houston presents today for injection per MD orders. B12 injection administered IM in left Upper Arm. Administration without incident. Patient tolerated well.  [name],cma   Reviewed.  Dr Nicki Reaper

## 2019-03-26 NOTE — Telephone Encounter (Signed)
Pt scheduled for b12 injection today.

## 2019-03-28 ENCOUNTER — Encounter: Payer: Self-pay | Admitting: Internal Medicine

## 2019-03-28 LAB — GASTROINTESTINAL PATHOGEN PANEL PCR
C. difficile Tox A/B, PCR: NOT DETECTED
Campylobacter, PCR: NOT DETECTED
Cryptosporidium, PCR: NOT DETECTED
E coli (ETEC) LT/ST PCR: NOT DETECTED
E coli (STEC) stx1/stx2, PCR: NOT DETECTED
E coli 0157, PCR: NOT DETECTED
Giardia lamblia, PCR: NOT DETECTED
Norovirus, PCR: NOT DETECTED
Rotavirus A, PCR: NOT DETECTED
Salmonella, PCR: NOT DETECTED
Shigella, PCR: NOT DETECTED

## 2019-03-29 ENCOUNTER — Encounter: Payer: Self-pay | Admitting: Internal Medicine

## 2019-04-03 ENCOUNTER — Encounter: Payer: Self-pay | Admitting: Lab

## 2019-04-03 ENCOUNTER — Other Ambulatory Visit: Payer: Self-pay

## 2019-04-03 ENCOUNTER — Ambulatory Visit (INDEPENDENT_AMBULATORY_CARE_PROVIDER_SITE_OTHER): Payer: BC Managed Care – PPO | Admitting: Lab

## 2019-04-03 VITALS — Temp 97.0°F

## 2019-04-03 DIAGNOSIS — E538 Deficiency of other specified B group vitamins: Secondary | ICD-10-CM

## 2019-04-03 MED ORDER — CYANOCOBALAMIN 1000 MCG/ML IJ SOLN
1000.0000 ug | Freq: Once | INTRAMUSCULAR | Status: AC
  Administered 2019-04-03: 1000 ug via INTRAMUSCULAR

## 2019-04-03 NOTE — Progress Notes (Addendum)
Pt in office today for Vit B-12 injection in R-Deltoid. Pt tolerated well.  Reviewed.  Dr Nicki Reaper

## 2019-04-10 ENCOUNTER — Other Ambulatory Visit: Payer: Self-pay

## 2019-04-10 ENCOUNTER — Ambulatory Visit (INDEPENDENT_AMBULATORY_CARE_PROVIDER_SITE_OTHER): Payer: BC Managed Care – PPO

## 2019-04-10 DIAGNOSIS — E538 Deficiency of other specified B group vitamins: Secondary | ICD-10-CM | POA: Diagnosis not present

## 2019-04-10 MED ORDER — CYANOCOBALAMIN 1000 MCG/ML IJ SOLN
1000.0000 ug | Freq: Once | INTRAMUSCULAR | Status: AC
  Administered 2019-04-10: 1000 ug via INTRAMUSCULAR

## 2019-04-10 NOTE — Progress Notes (Addendum)
Patient presented for B 12 injection to left deltoid, patient voiced no concerns nor showed any signs of distress during injection.  Reviewed.  Dr Scott 

## 2019-04-11 ENCOUNTER — Encounter: Payer: Self-pay | Admitting: Internal Medicine

## 2019-04-12 NOTE — Telephone Encounter (Signed)
Called patient for more information. She said that yesterday she was having some chills and then she has had a runny nose for weeks. She says she was not having any symptoms at the moment that she was here for her appt. Her husband developed a head ache on Sunday and then a cough. Tested positive for COVID yesterday. Family is now quarantined. Juliann Pulse will be discussing with administration to determine what needs to be done in the office.

## 2019-04-12 NOTE — Telephone Encounter (Signed)
Called patient again and confirmed that [facility]

## 2019-04-12 NOTE — Telephone Encounter (Signed)
Thank you for the update.  Let me know if I need to do anything.

## 2019-04-12 NOTE — Telephone Encounter (Signed)
She can take oral B12 1048mcg q day until this resolves.

## 2019-04-12 NOTE — Telephone Encounter (Signed)
Patient would like to know what she should do about her b12 injections or if she should take oral until this is resolved.

## 2019-04-17 ENCOUNTER — Ambulatory Visit: Payer: BC Managed Care – PPO

## 2019-04-23 ENCOUNTER — Encounter: Payer: Self-pay | Admitting: Internal Medicine

## 2019-04-23 NOTE — Telephone Encounter (Signed)
I can schedule her to have the other labs drawn in the future.  You can let her know the date - clinic policy.  I recommend the oral b12 1019mcg q day.  If having increased heart rate, sob - I also recommend letting cardiology know.

## 2019-04-24 ENCOUNTER — Encounter: Payer: Self-pay | Admitting: Internal Medicine

## 2019-04-24 ENCOUNTER — Other Ambulatory Visit: Payer: Self-pay

## 2019-04-24 ENCOUNTER — Telehealth: Payer: Self-pay | Admitting: Internal Medicine

## 2019-04-24 ENCOUNTER — Other Ambulatory Visit: Payer: Self-pay | Admitting: Internal Medicine

## 2019-04-24 MED ORDER — "SYRINGE 25G X 1"" 3 ML MISC": 2 refills | Status: DC

## 2019-04-24 MED ORDER — CYANOCOBALAMIN 1000 MCG/ML IJ SOLN: INTRAMUSCULAR | 0 refills | Status: DC

## 2019-04-24 NOTE — Telephone Encounter (Signed)
See my chart message

## 2019-04-24 NOTE — Telephone Encounter (Signed)
Pt thinks she needs a virtual with Dr. Nicki Reaper. She tested negative for covid but I saw the earliest she could come in was 05/07/19 due to when she had her covid test. She then started to tell me about the palpitations after I told her she couldn't come in today for a B12 until 05/07/19. She wants a call back.

## 2019-04-24 NOTE — Telephone Encounter (Signed)
Spoke with pt about b12- sent in rx so she can administer herself. She did discuss SOB, palpitations with cardiology. Advised that per our policy, she cannot return to office until 3/16. Pt requested virtual with you this week to discuss labs, b12, and weight loss program. Pt was scheduled for 4:00 tomorrow.

## 2019-04-25 ENCOUNTER — Telehealth (INDEPENDENT_AMBULATORY_CARE_PROVIDER_SITE_OTHER): Payer: BC Managed Care – PPO | Admitting: Internal Medicine

## 2019-04-25 ENCOUNTER — Encounter: Payer: Self-pay | Admitting: Internal Medicine

## 2019-04-25 DIAGNOSIS — K760 Fatty (change of) liver, not elsewhere classified: Secondary | ICD-10-CM

## 2019-04-25 DIAGNOSIS — R197 Diarrhea, unspecified: Secondary | ICD-10-CM

## 2019-04-25 DIAGNOSIS — E1165 Type 2 diabetes mellitus with hyperglycemia: Secondary | ICD-10-CM

## 2019-04-25 DIAGNOSIS — E538 Deficiency of other specified B group vitamins: Secondary | ICD-10-CM | POA: Diagnosis not present

## 2019-04-25 DIAGNOSIS — I480 Paroxysmal atrial fibrillation: Secondary | ICD-10-CM | POA: Diagnosis not present

## 2019-04-25 DIAGNOSIS — I10 Essential (primary) hypertension: Secondary | ICD-10-CM | POA: Diagnosis not present

## 2019-04-25 NOTE — Telephone Encounter (Signed)
Pt evaluated to day.  See note.

## 2019-04-25 NOTE — Telephone Encounter (Signed)
See other my chart message.  Pt has appt with me 04/25/19

## 2019-04-25 NOTE — Progress Notes (Addendum)
Patient ID: Joy Houston, female   DOB: 05-27-1965, 54 y.o.   MRN: [id number]   Virtual Visit via video Note  This visit type was conducted due to national recommendations for restrictions regarding the COVID-19 pandemic (e.g. social distancing).  This format is felt to be most appropriate for this patient at this time.  All issues noted in this document were discussed and addressed.  No physical exam was performed (except for noted visual exam findings with Video Visits).   I connected with Truman Hayward by a video enabled telemedicine application and verified that I am speaking with the correct person using two identifiers. Location patient: home Location provider: work Persons participating in the virtual visit: patient, provider  The limitations, risks, security and privacy concerns of performing an evaluation and management service by telephone and the availability of in person appointments have been discussed. The patient expressed understanding and agreed to proceed.   Reason for visit: work in appt  HPI: She has started receiving b12 injections at home.  Feels B12 helps with her palpitations.  Continues to have palpitations and sob.  Has seen cardiology and had f/u planned.  Still having bowel issues.  Discussed labs and desire to have specific lab testing.  Discussed anemia, pernicious anemia, B12 deficiency, etc.  She has adjusted her diet.  Has lost weight = states 30 pounds since 02/2019.  Is staying hydrated.  Discussed recent labs.     ROS: See pertinent positives and negatives per HPI.  Past Medical History:  Diagnosis Date  . Anemia   . Atrial fibrillation ([place])   . Complication of anesthesia   . Diabetes mellitus without complication (HCC)    diet controlled  . Eczema   . Family history of anesthesia complication    Mother - confusion  . Fatty liver   . Heart murmur    Slight - "nothing to worry about"  . Hemorrhoid   . Hypertension   . PONV (postoperative  nausea and vomiting)   . Sleep apnea    mild    Past Surgical History:  Procedure Laterality Date  . CESAREAN SECTION    . CHOLECYSTECTOMY N/A 09/07/2018   Procedure: LAPAROSCOPIC CHOLECYSTECTOMY;  Surgeon: Herbert Pun, MD;  Location: [facility] ORS;  Service: General;  Laterality: N/A;  . FINGER SURGERY     pin to 3rd finger Right hand  . LUMBAR LAMINECTOMY/DECOMPRESSION MICRODISCECTOMY  12/15/2011   Procedure: LUMBAR LAMINECTOMY/DECOMPRESSION MICRODISCECTOMY 1 LEVEL;  Surgeon: Ophelia Charter, MD;  Location: [place] [facility];  Service: Neurosurgery;  Laterality: Right;  RIGHT Lumbar four-Five diskectomy  . MICRODISCECTOMY LUMBAR     L4-5  . WISDOM TOOTH EXTRACTION      Family History  Problem Relation Age of Onset  . GER disease Mother   . Diabetes Father   . Cancer Father        Melanoma, Prostate  . Heart disease Father   . Heart disease Maternal Grandfather   . Diabetes Paternal Grandfather   . Breast cancer Sister 89    SOCIAL HX: reviewed.    Current Outpatient Medications:  .  cyanocobalamin (,VITAMIN B-12,) 1000 MCG/ML injection, Inject 1 mL into the muscle once weekly x 4 weeks and the once monthly., Disp: 10 mL, Rfl: 0 .  EPINEPHrine 0.3 mg/0.3 mL IJ SOAJ injection, Inject 0.3 mg into the muscle as needed for anaphylaxis. , Disp: , Rfl:  .  glucose blood (CONTOUR NEXT TEST) test strip, Use as instructed, Disp: 100 each,  Rfl: 12 .  Lancets (ONETOUCH ULTRASOFT) lancets, Use as instructed to check blood sugars once daily, Disp: 100 each, Rfl: 12 .  metoprolol succinate (TOPROL-XL) 25 MG 24 hr tablet, Take 25 mg by mouth daily., Disp: , Rfl:  .  metoprolol succinate (TOPROL-XL) 25 MG 24 hr tablet, TAKE 1/2 TABLET BY MOUTH EVERY DAY AS NEEDED, Disp: 45 tablet, Rfl: 1 .  Syringe/Needle, Disp, (SYRINGE 3CC/25GX1") 25G X 1" 3 ML MISC, Use as directed with b12 injections., Disp: 50 each, Rfl: 2  EXAM:  GENERAL: alert, oriented, appears well and in no acute  distress  HEENT: atraumatic, conjunttiva clear, no obvious abnormalities on inspection of external nose and ears  NECK: normal movements of the head and neck  LUNGS: on inspection no signs of respiratory distress, breathing rate appears normal, no obvious gross SOB, gasping or wheezing  CV: no obvious cyanosis  PSYCH/NEURO: pleasant and cooperative, no obvious depression or anxiety, speech and thought processing grossly intact  ASSESSMENT AND PLAN:  Discussed the following assessment and plan:  Type 2 diabetes mellitus with hyperglycemia (HCC) Low carb diet and exercise.  Has adjusted diet.  Lost weight.  Follow met b and a1c.   Paroxysmal A-fib (HCC) Persistent intermittent afib.  Symptomatic.  SOB associated.  On toprol.  Sees cardiology.  Have discussed ablation vs chemical cardioversion.  Lost weight - helping sleep apnea.    Hypertension On metoprolol.  Follow pressures.  Follow metabolic panel.   Fatty liver Diet, exercise and has lost weight.  Follow liver function tests.    Diarrhea Persistent loose stools.  May be related to gallbladder removal.  Discussed other testing and referral to GI.  Obtain labs.     Orders Placed This Encounter  Procedures  . Methylmalonic Acid    Standing Status:   Future    Standing Expiration Date:   05/03/2020  . Homocysteine    Standing Status:   Future    Standing Expiration Date:   05/03/2020  . Folate RBC    Standing Status:   Future    Standing Expiration Date:   05/03/2020  . Gliadin antibodies, serum    Standing Status:   Future    Standing Expiration Date:   05/03/2020  . Tissue transglutaminase, IgA    Standing Status:   Future    Standing Expiration Date:   05/03/2020  . Reticulin Antibody, IgA w reflex titer    Standing Status:   Future    Standing Expiration Date:   05/03/2020  . Intrinsic Factor Antibodies    Standing Status:   Future    Standing Expiration Date:   05/03/2020  . Ambulatory referral to Gastroenterology     Referral Priority:   Routine    Referral Type:   Consultation    Referral Reason:   Specialty Services Required    Number of Visits Requested:   1     I discussed the assessment and treatment plan with the patient. The patient was provided an opportunity to ask questions and all were answered. The patient agreed with the plan and demonstrated an understanding of the instructions.   The patient was advised to call back or seek an in-person evaluation if the symptoms worsen or if the condition fails to improve as anticipated.  I spent 40 minutes with the patient and more than 50% of the time was spent in consultation regarding the above.    Einar Pheasant, MD

## 2019-05-03 ENCOUNTER — Telehealth: Payer: Self-pay | Admitting: Internal Medicine

## 2019-05-03 NOTE — Telephone Encounter (Signed)
I am not sure what labs you are wanting ordered.

## 2019-05-03 NOTE — Telephone Encounter (Signed)
Patient called today to schedule labs per Dr. Nicki Reaper. No lab orders.

## 2019-05-04 ENCOUNTER — Encounter: Payer: Self-pay | Admitting: Internal Medicine

## 2019-05-04 DIAGNOSIS — E538 Deficiency of other specified B group vitamins: Secondary | ICD-10-CM | POA: Insufficient documentation

## 2019-05-04 NOTE — Assessment & Plan Note (Signed)
Persistent loose stools.  May be related to gallbladder removal.  Discussed other testing and referral to GI.  Obtain labs.

## 2019-05-04 NOTE — Assessment & Plan Note (Signed)
Diet, exercise and has lost weight.  Follow liver function tests.

## 2019-05-04 NOTE — Assessment & Plan Note (Signed)
Persistent intermittent afib.  Symptomatic.  SOB associated.  On toprol.  Sees cardiology.  Have discussed ablation vs chemical cardioversion.  Lost weight - helping sleep apnea.

## 2019-05-04 NOTE — Assessment & Plan Note (Signed)
Low carb diet and exercise.  Has adjusted diet.  Lost weight.  Follow met b and a1c.

## 2019-05-04 NOTE — Addendum Note (Signed)
Addended by: Alisa Graff on: 05/04/2019 05:04 PM   Modules accepted: Orders

## 2019-05-04 NOTE — Assessment & Plan Note (Signed)
On metoprolol.  Follow pressures.  Follow metabolic panel.  

## 2019-05-05 NOTE — Telephone Encounter (Signed)
Labs are ordered.  Need to make sure she is out of her time window regarding covid exposure.  See previous note.

## 2019-05-06 ENCOUNTER — Emergency Department: Payer: BC Managed Care – PPO

## 2019-05-06 ENCOUNTER — Encounter: Payer: Self-pay | Admitting: Internal Medicine

## 2019-05-06 ENCOUNTER — Encounter: Payer: Self-pay | Admitting: Emergency Medicine

## 2019-05-06 ENCOUNTER — Other Ambulatory Visit: Payer: Self-pay

## 2019-05-06 ENCOUNTER — Emergency Department
Admission: EM | Admit: 2019-05-06 | Discharge: 2019-05-06 | Disposition: A | Discharge status: Home / Self Care | Payer: BC Managed Care – PPO | Attending: Emergency Medicine | Admitting: Emergency Medicine

## 2019-05-06 DIAGNOSIS — I480 Paroxysmal atrial fibrillation: Secondary | ICD-10-CM

## 2019-05-06 DIAGNOSIS — R197 Diarrhea, unspecified: Secondary | ICD-10-CM | POA: Insufficient documentation

## 2019-05-06 DIAGNOSIS — I4891 Unspecified atrial fibrillation: Secondary | ICD-10-CM | POA: Diagnosis not present

## 2019-05-06 DIAGNOSIS — E860 Dehydration: Secondary | ICD-10-CM

## 2019-05-06 DIAGNOSIS — I10 Essential (primary) hypertension: Secondary | ICD-10-CM | POA: Diagnosis not present

## 2019-05-06 DIAGNOSIS — R0602 Shortness of breath: Secondary | ICD-10-CM | POA: Diagnosis not present

## 2019-05-06 DIAGNOSIS — R002 Palpitations: Secondary | ICD-10-CM | POA: Diagnosis not present

## 2019-05-06 DIAGNOSIS — E119 Type 2 diabetes mellitus without complications: Secondary | ICD-10-CM | POA: Insufficient documentation

## 2019-05-06 LAB — CBC
HCT: 48.8 % — ABNORMAL HIGH (ref 36.0–46.0)
Hemoglobin: 16.4 g/dL — ABNORMAL HIGH (ref 12.0–15.0)
MCH: 30.8 pg (ref 26.0–34.0)
MCHC: 33.6 g/dL (ref 30.0–36.0)
MCV: 91.6 fL (ref 80.0–100.0)
Platelets: 252 10*3/uL (ref 150–400)
RBC: 5.33 MIL/uL — ABNORMAL HIGH (ref 3.87–5.11)
RDW: 13.1 % (ref 11.5–15.5)
WBC: 11.3 10*3/uL — ABNORMAL HIGH (ref 4.0–10.5)
nRBC: 0 % (ref 0.0–0.2)

## 2019-05-06 LAB — BASIC METABOLIC PANEL
Anion gap: 12 (ref 5–15)
BUN: 21 mg/dL — ABNORMAL HIGH (ref 6–20)
CO2: 25 mmol/L (ref 22–32)
Calcium: 9.6 mg/dL (ref 8.9–10.3)
Chloride: 100 mmol/L (ref 98–111)
Creatinine, Ser: 0.75 mg/dL (ref 0.44–1.00)
GFR calc Af Amer: >60 mL/min (ref >60)
GFR calc non Af Amer: >60 mL/min (ref >60)
Glucose, Bld: 130 mg/dL — ABNORMAL HIGH (ref 70–99)
Potassium: 3.9 mmol/L (ref 3.5–5.1)
Sodium: 137 mmol/L (ref 135–145)

## 2019-05-06 LAB — VITAMIN D 25 HYDROXY (VIT D DEFICIENCY, FRACTURES): Vit D, 25-Hydroxy: 25.19 ng/mL — ABNORMAL LOW (ref 30–100)

## 2019-05-06 LAB — MAGNESIUM: Magnesium: 2.3 mg/dL (ref 1.7–2.4)

## 2019-05-06 LAB — VITAMIN B12: Vitamin B-12: 729 pg/mL (ref 180–914)

## 2019-05-06 LAB — TROPONIN I (HIGH SENSITIVITY)
Troponin I (High Sensitivity): 5 ng/L (ref <18)
Troponin I (High Sensitivity): 4 ng/L (ref <18)

## 2019-05-06 MED ORDER — SODIUM CHLORIDE 0.9 % IV SOLN: Freq: Once | INTRAVENOUS | Status: AC

## 2019-05-06 NOTE — ED Provider Notes (Signed)
[facility] Emergency Department Provider Note       Time seen: ----------------------------------------- 8:20 AM on 05/06/2019 -----------------------------------------   I have reviewed the triage vital signs and the nursing notes.  HISTORY   Chief Complaint Palpitations    HPI Joy Houston is a 54 y.o. female with a history of anemia, atrial fibrillation, hypertension who presents to the ED for palpitations.  Patient states she has a history of atrial fibrillation, has been having intermittent palpitations for the past 1 to 2 months.  She has also had some diarrhea.  He has recently had some dietary changes, denies any other symptoms at this time.  Past Medical History:  Diagnosis Date  . Anemia   . Atrial fibrillation ([place])   . Complication of anesthesia   . Diabetes mellitus without complication (HCC)    diet controlled  . Eczema   . Family history of anesthesia complication    Mother - confusion  . Fatty liver   . Heart murmur    Slight - "nothing to worry about"  . Hemorrhoid   . Hypertension   . PONV (postoperative nausea and vomiting)   . Sleep apnea    mild    Patient Active Problem List   Diagnosis Date Noted  . B12 deficiency 05/04/2019  . Left lower quadrant pain 01/27/2019  . Type 2 diabetes mellitus with hyperglycemia ([place]) 01/08/2019  . Gallstones 08/16/2018  . Fatty liver 08/16/2018  . Diarrhea 05/27/2018  . History of angioedema 05/27/2018  . SOB (shortness of breath) 05/10/2018  . Snoring 03/08/2018  . Pedal edema 03/08/2018  . Paroxysmal A-fib ([place]) 03/01/2018  . OSA (obstructive sleep apnea) 03/01/2018  . Tachycardia 01/14/2018  . Chest pain 11/19/2017  . Right knee pain 03/05/2017  . Abdominal fullness in right upper quadrant 01/13/2015  . Sebaceous cyst 09/13/2014  . Change in vision 05/06/2014  . Health care maintenance 05/06/2014  . Abnormal liver function 01/08/2012  . Hyperlipidemia, mixed  01/08/2012  . Hematuria 01/08/2012  . Hypertension 01/04/2012  . Lumbar herniated disc 12/15/2011    Past Surgical History:  Procedure Laterality Date  . CESAREAN SECTION    . CHOLECYSTECTOMY N/A 09/07/2018   Procedure: LAPAROSCOPIC CHOLECYSTECTOMY;  Surgeon: Herbert Pun, MD;  Location: [facility] ORS;  Service: General;  Laterality: N/A;  . FINGER SURGERY     pin to 3rd finger Right hand  . LUMBAR LAMINECTOMY/DECOMPRESSION MICRODISCECTOMY  12/15/2011   Procedure: LUMBAR LAMINECTOMY/DECOMPRESSION MICRODISCECTOMY 1 LEVEL;  Surgeon: Ophelia Charter, MD;  Location: [place] [facility]UR[facility] ORS;  Service: Neurosurgery;  Laterality: Right;  RIGHT Lumbar four-Five diskectomy  . MICRODISCECTOMY LUMBAR     L4-5  . WISDOM TOOTH EXTRACTION      Allergies Angiotensin receptor blockers, Lisinopril, Other, and Morphine and related  Social History Social History   Tobacco Use  . Smoking status: Never Smoker  . Smokeless tobacco: Never Used  Substance Use Topics  . Alcohol use: Not Currently    Alcohol/week: 0.0 standard drinks  . Drug use: No    Review of Systems Constitutional: Negative for fever. Cardiovascular: Negative for chest pain.  Positive for palpitations Respiratory: Negative for shortness of breath. Gastrointestinal: Negative for abdominal pain, positive for diarrhea Musculoskeletal: Negative for back pain. Skin: Negative for rash. Neurological: Negative for headaches, focal weakness or numbness.  All systems negative/normal/unremarkable except as stated in the HPI  ____________________________________________   PHYSICAL EXAM:  VITAL SIGNS: ED Triage Vitals  Enc Vitals Group  BP 05/06/19 0503 (!) 156/70     Pulse Rate 05/06/19 0503 89     Resp 05/06/19 0503 18     Temp 05/06/19 0503 98.2 F (36.8 C)     Temp Source 05/06/19 0503 Oral     SpO2 05/06/19 0503 97 %     Weight 05/06/19 0504 227 lb 6.4 oz (103.1 kg)     Height 05/06/19 0504 5\' 6"  (1.676 m)     Head  Circumference --      Peak Flow --      Pain Score 05/06/19 0503 0     Pain Loc --      Pain Edu? --      Excl. in [place]? --     Constitutional: Alert and oriented. Well appearing and in no distress. Eyes: Conjunctivae are normal. Normal extraocular movements. Cardiovascular: Normal rate, regular rhythm. No murmurs, rubs, or gallops. Respiratory: Normal respiratory effort without tachypnea nor retractions. Breath sounds are clear and equal bilaterally. No wheezes/rales/rhonchi. Gastrointestinal: Soft and nontender. Normal bowel sounds Musculoskeletal: Nontender with normal range of motion in extremities. No lower extremity tenderness nor edema. Neurologic:  Normal speech and language. No gross focal neurologic deficits are appreciated.  Skin:  Skin is warm, dry and intact. No rash noted. Psychiatric: Mood and affect are normal. Speech and behavior are normal.  ____________________________________________  EKG: Interpreted by me.  Sinus rhythm with rate of 86 bpm, normal PR interval, normal QRS, normal QT  ____________________________________________  ED COURSE:  As part of my medical decision making, I reviewed the following data within the [place] History obtained from family if available, nursing notes, old chart and ekg, as well as notes from prior ED visits. Patient presented for palpitations, we will assess with labs and imaging as indicated at this time.   Procedures  Joy Houston was evaluated in Emergency Department on 05/06/2019 for the symptoms described in the history of present illness. She was evaluated in the context of the global COVID-19 pandemic, which necessitated consideration that the patient might be at risk for infection with the SARS-CoV-2 virus that causes COVID-19. Institutional protocols and algorithms that pertain to the evaluation of patients at risk for COVID-19 are in a state of rapid change based on information released by regulatory  bodies including the CDC and federal and state organizations. These policies and algorithms were followed during the patient's care in the ED.  ____________________________________________   LABS (pertinent positives/negatives)  Labs Reviewed  BASIC METABOLIC PANEL - Abnormal; Notable for the following components:      Result Value   Glucose, Bld 130 (*)    BUN 21 (*)    All other components within normal limits  CBC - Abnormal; Notable for the following components:   WBC 11.3 (*)    RBC 5.33 (*)    Hemoglobin 16.4 (*)    HCT 48.8 (*)    All other components within normal limits  MAGNESIUM  VITAMIN B12  VITAMIN D 25 HYDROXY (VIT D DEFICIENCY, FRACTURES)  CELIAC DISEASE PANEL  TROPONIN I (HIGH SENSITIVITY)  TROPONIN I (HIGH SENSITIVITY)    RADIOLOGY Images were viewed by me  Chest x-ray is unremarkable  ____________________________________________   DIFFERENTIAL DIAGNOSIS   Atrial fibrillation, arrhythmia, anxiety, electrolyte abnormality, dehydration  FINAL ASSESSMENT AND PLAN  Paroxysmal atrial fibrillation, dehydration   Plan: The patient had presented for symptoms of paroxysmal atrial fibrillation which are known. Patient's labs did indicate some dehydration for which she  was given IV fluids. Patient's imaging did not reveal any acute process.  She is having paroxysmal atrial fibrillation but otherwise appears stable for outpatient follow-up.   Laurence Aly, MD    Note: This note was generated in part or whole with voice recognition software. Voice recognition is usually quite accurate but there are transcription errors that can and very often do occur. I apologize for any typographical errors that were not detected and corrected.     Earleen Newport, MD 05/06/19 1119

## 2019-05-06 NOTE — ED Triage Notes (Signed)
Patient states that she has a history of afib. Patient states that she has been having palpitations for 1-2 months.

## 2019-05-06 NOTE — Telephone Encounter (Signed)
Per my chart message, lab appt is not needed at this time

## 2019-05-08 LAB — CELIAC DISEASE PANEL
Endomysial Ab, IgA: NEGATIVE
IgA: 386 mg/dL — ABNORMAL HIGH (ref 87–352)
Tissue Transglutaminase Ab, IgA: <2 U/mL (ref 0–3)

## 2019-05-09 DIAGNOSIS — R7879 Finding of abnormal level of heavy metals in blood: Secondary | ICD-10-CM | POA: Diagnosis not present

## 2019-05-09 DIAGNOSIS — K9089 Other intestinal malabsorption: Secondary | ICD-10-CM | POA: Diagnosis not present

## 2019-05-09 DIAGNOSIS — D513 Other dietary vitamin B12 deficiency anemia: Secondary | ICD-10-CM | POA: Diagnosis not present

## 2019-05-09 DIAGNOSIS — K521 Toxic gastroenteritis and colitis: Secondary | ICD-10-CM | POA: Diagnosis not present

## 2019-05-09 DIAGNOSIS — K900 Celiac disease: Secondary | ICD-10-CM | POA: Diagnosis not present

## 2019-05-09 DIAGNOSIS — Z77120 Contact with and (suspected) exposure to mold (toxic): Secondary | ICD-10-CM | POA: Diagnosis not present

## 2019-05-09 DIAGNOSIS — E559 Vitamin D deficiency, unspecified: Secondary | ICD-10-CM | POA: Diagnosis not present

## 2019-05-09 DIAGNOSIS — R945 Abnormal results of liver function studies: Secondary | ICD-10-CM | POA: Diagnosis not present

## 2019-05-16 ENCOUNTER — Other Ambulatory Visit: Payer: Self-pay | Admitting: Internal Medicine

## 2019-05-17 DIAGNOSIS — G4733 Obstructive sleep apnea (adult) (pediatric): Secondary | ICD-10-CM | POA: Diagnosis not present

## 2019-05-17 DIAGNOSIS — I10 Essential (primary) hypertension: Secondary | ICD-10-CM | POA: Diagnosis not present

## 2019-05-17 DIAGNOSIS — I480 Paroxysmal atrial fibrillation: Secondary | ICD-10-CM | POA: Diagnosis not present

## 2019-05-17 DIAGNOSIS — E782 Mixed hyperlipidemia: Secondary | ICD-10-CM | POA: Diagnosis not present

## 2019-05-31 ENCOUNTER — Encounter: Payer: Self-pay | Admitting: Internal Medicine

## 2019-06-18 DIAGNOSIS — M2550 Pain in unspecified joint: Secondary | ICD-10-CM | POA: Diagnosis not present

## 2019-06-18 DIAGNOSIS — I4819 Other persistent atrial fibrillation: Secondary | ICD-10-CM | POA: Diagnosis not present

## 2019-06-18 DIAGNOSIS — R7301 Impaired fasting glucose: Secondary | ICD-10-CM | POA: Diagnosis not present

## 2019-06-18 DIAGNOSIS — E063 Autoimmune thyroiditis: Secondary | ICD-10-CM | POA: Diagnosis not present

## 2019-06-18 DIAGNOSIS — M0580 Other rheumatoid arthritis with rheumatoid factor of unspecified site: Secondary | ICD-10-CM | POA: Diagnosis not present

## 2019-06-25 ENCOUNTER — Other Ambulatory Visit: Payer: Self-pay

## 2019-06-25 ENCOUNTER — Ambulatory Visit: Payer: BC Managed Care – PPO | Admitting: Internal Medicine

## 2019-06-25 ENCOUNTER — Encounter: Payer: Self-pay | Admitting: Internal Medicine

## 2019-06-25 DIAGNOSIS — E538 Deficiency of other specified B group vitamins: Secondary | ICD-10-CM

## 2019-06-25 DIAGNOSIS — K760 Fatty (change of) liver, not elsewhere classified: Secondary | ICD-10-CM

## 2019-06-25 DIAGNOSIS — I10 Essential (primary) hypertension: Secondary | ICD-10-CM

## 2019-06-25 DIAGNOSIS — I480 Paroxysmal atrial fibrillation: Secondary | ICD-10-CM | POA: Diagnosis not present

## 2019-06-25 DIAGNOSIS — E782 Mixed hyperlipidemia: Secondary | ICD-10-CM

## 2019-06-25 DIAGNOSIS — E1165 Type 2 diabetes mellitus with hyperglycemia: Secondary | ICD-10-CM

## 2019-06-25 DIAGNOSIS — R0602 Shortness of breath: Secondary | ICD-10-CM

## 2019-06-25 NOTE — Assessment & Plan Note (Signed)
Persistent intermittent afib.  Is associated with sob.  On metoprolol.  Followed by cardiology.  Has adjusted diet.  Lost weight.  Continue treatment of sleep apnea.

## 2019-06-25 NOTE — Assessment & Plan Note (Signed)
Still with some sob.  Has afib.  Sees cardiology.  Has adjusted her diet.  Lost weight.  Discussed treating sleep apnea.  Follow.

## 2019-06-25 NOTE — Assessment & Plan Note (Signed)
Has adjusted her diet.  Lost weight.  Follow met b and a1c.  

## 2019-06-25 NOTE — Assessment & Plan Note (Signed)
Low cholesterol diet and exercise.  Follow lipid panel.   

## 2019-06-25 NOTE — Assessment & Plan Note (Signed)
On metoprolol.  Follow pressures.  Follow metabolic panel.  

## 2019-06-25 NOTE — Progress Notes (Signed)
Patient ID: Joy Houston, female   DOB: Jun 23, 1965, 54 y.o.   MRN: [id number]   Subjective:    Patient ID: Joy Houston, female    DOB: 1966/01/09, 54 y.o.   MRN: [id number]  HPI This visit occurred during the SARS-CoV-2 public health emergency.  Safety protocols were in place, including screening questions prior to the visit, additional usage of staff PPE, and extensive cleaning of exam room while observing appropriate contact time as indicated for disinfecting solutions.  Patient here for a scheduled follow up.  She has adjusted her diet.  Has decreased and tried to cut out dairy, gluten and sugar.  Has lost weight.  Feels better.  Seeing Dr Dalbert Batman - Functional Medicine Physician.  Has had extensive lab testing.  Copies of labs sent to me.  Discussed some questions she had regarding some of her lab tests.  She is taking digestive enzymes, B12 (both injections and sublingual), glutathione, vitamin D and NAC.  She is planning to f/u with Dr Dalbert Batman to discuss and f/u on the lab tests that she ordered.  Supplements are being prescribed and dose adjusted by Dr Dalbert Batman.  She is working remotely.  Meditating.  Helping.  Diarrhea has improved.  Still has some sob and palpitations.  Has seen Dr Nehemiah Massed recently.  On metoprolol.    Past Medical History:  Diagnosis Date  . Anemia   . Atrial fibrillation ([place])   . Complication of anesthesia   . Diabetes mellitus without complication (HCC)    diet controlled  . Eczema   . Family history of anesthesia complication    Mother - confusion  . Fatty liver   . Heart murmur    Slight - "nothing to worry about"  . Hemorrhoid   . Hypertension   . PONV (postoperative nausea and vomiting)   . Sleep apnea    mild   Past Surgical History:  Procedure Laterality Date  . CESAREAN SECTION    . CHOLECYSTECTOMY N/A 09/07/2018   Procedure: LAPAROSCOPIC CHOLECYSTECTOMY;  Surgeon: Herbert Pun, MD;  Location: [facility] ORS;  Service: General;  Laterality:  N/A;  . FINGER SURGERY     pin to 3rd finger Right hand  . LUMBAR LAMINECTOMY/DECOMPRESSION MICRODISCECTOMY  12/15/2011   Procedure: LUMBAR LAMINECTOMY/DECOMPRESSION MICRODISCECTOMY 1 LEVEL;  Surgeon: Ophelia Charter, MD;  Location: [place] [facility] ORS;  Service: Neurosurgery;  Laterality: Right;  RIGHT Lumbar four-Five diskectomy  . MICRODISCECTOMY LUMBAR     L4-5  . WISDOM TOOTH EXTRACTION     Family History  Problem Relation Age of Onset  . GER disease Mother   . Diabetes Father   . Cancer Father        Melanoma, Prostate  . Heart disease Father   . Heart disease Maternal Grandfather   . Diabetes Paternal Grandfather   . Breast cancer Sister 20   Social History   Socioeconomic History  . Marital status: Married    Spouse name: Not on file  . Number of children: 2  . Years of education: Not on file  . Highest education level: Not on file  Occupational History    Employer: [employer]  Tobacco Use  . Smoking status: Never Smoker  . Smokeless tobacco: Never Used  Substance and Sexual Activity  . Alcohol use: Not Currently    Alcohol/week: 0.0 standard drinks  . Drug use: No  . Sexual activity: Not on file  Other Topics Concern  . Not on file  Social History Narrative  Nurse    Married   Social Determinants of Radio broadcast assistant Strain:   . Difficulty of Paying Living Expenses:   Food Insecurity:   . Worried About Charity fundraiser in the Last Year:   . Arboriculturist in the Last Year:   Transportation Needs:   . Film/video editor (Medical):   Marland Kitchen Lack of Transportation (Non-Medical):   Physical Activity:   . Days of Exercise per Week:   . Minutes of Exercise per Session:   Stress:   . Feeling of Stress :   Social Connections:   . Frequency of Communication with Friends and Family:   . Frequency of Social Gatherings with Friends and Family:   . Attends Religious Services:   . Active Member of Clubs or Organizations:   . Attends Theatre manager Meetings:   Marland Kitchen Marital Status:     Outpatient Encounter Medications as of 06/25/2019  Medication Sig  . GLUTATHIONE PO   . NON FORMULARY   . ACETYLCYSTEINE PO Take by mouth.  . Cyanocobalamin (B-12) 1000 MCG SUBL   . EPINEPHrine 0.3 mg/0.3 mL IJ SOAJ injection Inject 0.3 mg into the muscle as needed for anaphylaxis.   Marland Kitchen glucose blood (CONTOUR NEXT TEST) test strip Use as instructed  . Lancets (ONETOUCH ULTRASOFT) lancets Use as instructed to check blood sugars once daily  . metoprolol succinate (TOPROL-XL) 25 MG 24 hr tablet Take 25 mg by mouth daily.  . NON FORMULARY 1 tablet as needed Activated Charcoal  . NON FORMULARY by Subgaleal route Trizomalglutathine  . NON FORMULARY   . Syringe/Needle, Disp, (SYRINGE 3CC/25GX1") 25G X 1" 3 ML MISC Use as directed with b12 injections.  . [DISCONTINUED] cyanocobalamin (,VITAMIN B-12,) 1000 MCG/ML injection INJECT 1 ML INTO THE MUSCLE ONCE WEEKLY X 4 WEEKS AND THEN ONCE MONTHLY.  . [DISCONTINUED] metoprolol succinate (TOPROL-XL) 25 MG 24 hr tablet TAKE 1/2 TABLET BY MOUTH EVERY DAY AS NEEDED   No facility-administered encounter medications on file as of 06/25/2019.    Review of Systems  Constitutional:       Has adjusted her diet.  Lost weight.    HENT: Negative for congestion and sinus pressure.   Respiratory: Negative for cough and chest tightness.        Still some sob.    Cardiovascular: Positive for palpitations. Negative for chest pain and leg swelling.  Gastrointestinal: Negative for abdominal pain, nausea and vomiting.       Diarrhea improved.    Genitourinary: Negative for difficulty urinating and dysuria.  Musculoskeletal: Negative for joint swelling and myalgias.  Skin: Negative for color change and rash.  Neurological: Negative for dizziness, light-headedness and headaches.  Psychiatric/Behavioral: Negative for agitation and dysphoric mood.       Objective:    Physical Exam Constitutional:      General: She is  not in acute distress.    Appearance: Normal appearance.  HENT:     Head: Normocephalic and atraumatic.     Right Ear: External ear normal.     Left Ear: External ear normal.  Eyes:     General: No scleral icterus.       Right eye: No discharge.        Left eye: No discharge.     Conjunctiva/sclera: Conjunctivae normal.  Neck:     Thyroid: No thyromegaly.  Cardiovascular:     Rate and Rhythm: Normal rate and regular rhythm.  Pulmonary:  Effort: No respiratory distress.     Breath sounds: Normal breath sounds. No wheezing.  Abdominal:     General: Bowel sounds are normal.     Palpations: Abdomen is soft.     Tenderness: There is no abdominal tenderness.  Musculoskeletal:        General: No swelling or tenderness.     Cervical back: Neck supple. No tenderness.  Lymphadenopathy:     Cervical: No cervical adenopathy.  Skin:    Findings: No erythema or rash.  Neurological:     Mental Status: She is alert.  Psychiatric:        Mood and Affect: Mood normal.        Behavior: Behavior normal.     BP 128/78   Pulse 79   Temp (!) 97 F (36.1 C)   Resp 16   Ht _0  (1.676 m)   Wt 217 lb (98.4 kg)   SpO2 98%   BMI 35.02 kg/m  Wt Readings from Last 3 Encounters:  06/25/19 217 lb (98.4 kg)  05/06/19 227 lb 6.4 oz (103.1 kg)  03/21/19 240 lb 9.6 oz (109.1 kg)     Lab Results  Component Value Date   WBC 11.3 (H) 05/06/2019   HGB 16.4 (H) 05/06/2019   HCT 48.8 (H) 05/06/2019   PLT 252 05/06/2019   GLUCOSE 130 (H) 05/06/2019   CHOL 187 03/25/2019   TRIG 93.0 03/25/2019   HDL 54.10 03/25/2019   LDLDIRECT 145.4 03/01/2013   LDLCALC 114 (H) 03/25/2019   ALT 28 03/25/2019   AST 18 03/25/2019   NA 137 05/06/2019   K 3.9 05/06/2019   CL 100 05/06/2019   CREATININE 0.75 05/06/2019   BUN 21 (H) 05/06/2019   CO2 25 05/06/2019   TSH 3.26 03/25/2019   HGBA1C 5.6 03/25/2019   MICROALBUR 0.9 05/06/2014    DG Chest 2 View  Result Date: 05/06/2019 CLINICAL DATA:   Shortness of breath.  Palpitations EXAM: CHEST - 2 VIEW COMPARISON:  08/16/2018 FINDINGS: Normal heart size and mediastinal contours. No acute infiltrate or edema. No effusion or pneumothorax. No acute osseous findings. Thoracic scoliosis. Cholecystectomy clips. IMPRESSION: No evidence of active disease. Electronically Signed   By: Monte Fantasia M.D.   On: 05/06/2019 05:24       Assessment & Plan:   Problem List Items Addressed This Visit    B12 deficiency    Is being monitored and followed by Dr Dalbert Batman.        Fatty liver    Has adjusted her diet.  Lost weight.  Follow liver function tests.        Hyperlipidemia, mixed    Low cholesterol diet and exercise.  Follow lipid panel.        Hypertension    On metoprolol.  Follow pressures.  Follow metabolic panel.        Paroxysmal A-fib (HCC)    Persistent intermittent afib.  Is associated with sob.  On metoprolol.  Followed by cardiology.  Has adjusted diet.  Lost weight.  Continue treatment of sleep apnea.        SOB (shortness of breath)    Still with some sob.  Has afib.  Sees cardiology.  Has adjusted her diet.  Lost weight.  Discussed treating sleep apnea.  Follow.        Type 2 diabetes mellitus with hyperglycemia (HCC)    Has adjusted her diet.  Lost weight.  Follow met b and a1c.  Eldred Sooy, MD 

## 2019-06-25 NOTE — Assessment & Plan Note (Signed)
Is being monitored and followed by Dr Dalbert Batman.

## 2019-06-25 NOTE — Assessment & Plan Note (Signed)
Has adjusted her diet.  Lost weight.  Follow liver function tests.

## 2019-07-01 ENCOUNTER — Encounter: Payer: Self-pay | Admitting: Internal Medicine

## 2019-08-08 DIAGNOSIS — E559 Vitamin D deficiency, unspecified: Secondary | ICD-10-CM | POA: Diagnosis not present

## 2019-08-08 DIAGNOSIS — R5383 Other fatigue: Secondary | ICD-10-CM | POA: Diagnosis not present

## 2019-08-08 DIAGNOSIS — S30861A Insect bite (nonvenomous) of abdominal wall, initial encounter: Secondary | ICD-10-CM | POA: Diagnosis not present

## 2019-08-08 DIAGNOSIS — N943 Premenstrual tension syndrome: Secondary | ICD-10-CM | POA: Diagnosis not present

## 2019-08-08 DIAGNOSIS — R002 Palpitations: Secondary | ICD-10-CM | POA: Diagnosis not present

## 2019-08-08 DIAGNOSIS — M1990 Unspecified osteoarthritis, unspecified site: Secondary | ICD-10-CM | POA: Diagnosis not present

## 2019-08-08 DIAGNOSIS — E538 Deficiency of other specified B group vitamins: Secondary | ICD-10-CM | POA: Diagnosis not present

## 2019-08-09 ENCOUNTER — Other Ambulatory Visit: Payer: Self-pay | Admitting: Internal Medicine

## 2019-08-18 ENCOUNTER — Emergency Department: Payer: BC Managed Care – PPO

## 2019-08-18 ENCOUNTER — Encounter: Payer: Self-pay | Admitting: Emergency Medicine

## 2019-08-18 ENCOUNTER — Other Ambulatory Visit: Payer: Self-pay

## 2019-08-18 ENCOUNTER — Emergency Department
Admission: EM | Admit: 2019-08-18 | Discharge: 2019-08-19 | Disposition: A | Discharge status: Home / Self Care | Payer: BC Managed Care – PPO | Attending: Emergency Medicine | Admitting: Emergency Medicine

## 2019-08-18 DIAGNOSIS — Z79899 Other long term (current) drug therapy: Secondary | ICD-10-CM | POA: Diagnosis not present

## 2019-08-18 DIAGNOSIS — I10 Essential (primary) hypertension: Secondary | ICD-10-CM | POA: Insufficient documentation

## 2019-08-18 DIAGNOSIS — E119 Type 2 diabetes mellitus without complications: Secondary | ICD-10-CM | POA: Insufficient documentation

## 2019-08-18 DIAGNOSIS — M5126 Other intervertebral disc displacement, lumbar region: Secondary | ICD-10-CM

## 2019-08-18 DIAGNOSIS — M5442 Lumbago with sciatica, left side: Secondary | ICD-10-CM

## 2019-08-18 DIAGNOSIS — M545 Low back pain: Secondary | ICD-10-CM | POA: Diagnosis not present

## 2019-08-18 MED ORDER — ONDANSETRON HCL 4 MG/2ML IJ SOLN
4.0000 mg | Freq: Once | INTRAMUSCULAR | Status: DC
  Filled 2019-08-18: qty 2

## 2019-08-18 MED ORDER — LORAZEPAM 2 MG/ML IJ SOLN
INTRAMUSCULAR | Status: AC
  Filled 2019-08-18: qty 1

## 2019-08-18 MED ORDER — FENTANYL CITRATE (PF) 100 MCG/2ML IJ SOLN
INTRAMUSCULAR | Status: AC
  Filled 2019-08-18: qty 2

## 2019-08-18 MED ORDER — FENTANYL CITRATE (PF) 100 MCG/2ML IJ SOLN
100.0000 ug | Freq: Once | INTRAMUSCULAR | Status: DC
  Filled 2019-08-18: qty 2

## 2019-08-18 MED ORDER — DEXAMETHASONE SODIUM PHOSPHATE 10 MG/ML IJ SOLN
INTRAMUSCULAR | Status: AC
  Filled 2019-08-18: qty 1

## 2019-08-18 MED ORDER — DEXAMETHASONE SODIUM PHOSPHATE 10 MG/ML IJ SOLN
10.0000 mg | Freq: Once | INTRAMUSCULAR | Status: AC
  Administered 2019-08-18: 10 mg via INTRAVENOUS

## 2019-08-18 MED ORDER — FENTANYL CITRATE (PF) 100 MCG/2ML IJ SOLN
50.0000 ug | Freq: Once | INTRAMUSCULAR | Status: AC
  Administered 2019-08-18: 50 ug via INTRAVENOUS

## 2019-08-18 MED ORDER — LORAZEPAM 2 MG/ML IJ SOLN
1.0000 mg | Freq: Once | INTRAMUSCULAR | Status: AC
  Administered 2019-08-18: 1 mg via INTRAVENOUS

## 2019-08-18 NOTE — ED Notes (Signed)
Patient transported to MRI 

## 2019-08-18 NOTE — ED Triage Notes (Signed)
Patient with complaint of back pain that started this afternoon. Patient states that the pain starts in her left lower back and is radiating down her left leg.

## 2019-08-18 NOTE — ED Provider Notes (Signed)
[facility] Emergency Department Provider Note  ____________________________________________   First MD Initiated Contact with Patient 08/18/19 2229     (approximate)  I have reviewed the triage vital signs and the nursing notes.   HISTORY  Chief Complaint Back Pain    HPI Joy Houston is a 54 y.o. female presents to the emergency department with low back pain that radiates to the left leg and has decreased range of motion of the left great toe.  Patient has a long history of back problems with 2 prior back surgeries.  Patient also has history of A. fib.  She has had several health problems over the past year.  She has become very concerned about her health.  They did go on vacation last week and she rode in a car for 4 and half hours.  She also took a long walk on the beach prior to returning home yesterday.  Patient states that is the also ride a bike and did not know if all of this had exaggerated the pain in her back.  At first she is refusing pain medications.  Pain scale is 10/10 with movement.    Past Medical History:  Diagnosis Date  . Anemia   . Atrial fibrillation ([place])   . Complication of anesthesia   . Diabetes mellitus without complication (HCC)    diet controlled  . Eczema   . Family history of anesthesia complication    Mother - confusion  . Fatty liver   . Heart murmur    Slight - "nothing to worry about"  . Hemorrhoid   . Hypertension   . PONV (postoperative nausea and vomiting)   . Sleep apnea    mild    Patient Active Problem List   Diagnosis Date Noted  . B12 deficiency 05/04/2019  . Left lower quadrant pain 01/27/2019  . Type 2 diabetes mellitus with hyperglycemia ([place]) 01/08/2019  . Gallstones 08/16/2018  . Fatty liver 08/16/2018  . Diarrhea 05/27/2018  . History of angioedema 05/27/2018  . SOB (shortness of breath) 05/10/2018  . Snoring 03/08/2018  . Pedal edema 03/08/2018  . Paroxysmal A-fib ([place]) 03/01/2018    . OSA (obstructive sleep apnea) 03/01/2018  . Tachycardia 01/14/2018  . Chest pain 11/19/2017  . Right knee pain 03/05/2017  . Abdominal fullness in right upper quadrant 01/13/2015  . Sebaceous cyst 09/13/2014  . Change in vision 05/06/2014  . Health care maintenance 05/06/2014  . Abnormal liver function 01/08/2012  . Hyperlipidemia, mixed 01/08/2012  . Hematuria 01/08/2012  . Hypertension 01/04/2012  . Lumbar herniated disc 12/15/2011    Past Surgical History:  Procedure Laterality Date  . CESAREAN SECTION    . CHOLECYSTECTOMY N/A 09/07/2018   Procedure: LAPAROSCOPIC CHOLECYSTECTOMY;  Surgeon: Herbert Pun, MD;  Location: [facility] ORS;  Service: General;  Laterality: N/A;  . FINGER SURGERY     pin to 3rd finger Right hand  . LUMBAR LAMINECTOMY/DECOMPRESSION MICRODISCECTOMY  12/15/2011   Procedure: LUMBAR LAMINECTOMY/DECOMPRESSION MICRODISCECTOMY 1 LEVEL;  Surgeon: Ophelia Charter, MD;  Location: [place] [facility]UR[facility] ORS;  Service: Neurosurgery;  Laterality: Right;  RIGHT Lumbar four-Five diskectomy  . MICRODISCECTOMY LUMBAR     L4-5  . WISDOM TOOTH EXTRACTION      Prior to Admission medications   Medication Sig Start Date End Date Taking? Authorizing Provider  ACETYLCYSTEINE PO Take by mouth.    [provider]  Cyanocobalamin (B-12) 1000 MCG SUBL     [provider]  EPINEPHrine 0.3  mg/0.3 mL IJ SOAJ injection Inject 0.3 mg into the muscle as needed for anaphylaxis.  05/08/18   [provider]  glucose blood (CONTOUR NEXT TEST) test strip Use as instructed 01/22/19   Einar Pheasant, MD  GLUTATHIONE PO  05/09/19   [provider]  Lancets [facility] ULTRASOFT) lancets Use as instructed to check blood sugars once daily 12/20/18   Einar Pheasant, MD  metoprolol succinate (TOPROL-XL) 25 MG 24 hr tablet Take 25 mg by mouth daily.    [provider]  NON FORMULARY 1 tablet as needed Activated Charcoal    [provider]  NON FORMULARY  by Subgaleal route Trizomalglutathine    [provider]  NON FORMULARY  05/09/19   [provider]  NON FORMULARY     [provider]  Syringe/Needle, Disp, (SYRINGE 3CC/25GX1") 25G X 1" 3 ML MISC Use as directed with b12 injections. 04/24/19   Einar Pheasant, MD    Allergies Angiotensin receptor blockers, Lisinopril, Other, and Morphine and related  Family History  Problem Relation Age of Onset  . GER disease Mother   . Diabetes Father   . Cancer Father        Melanoma, Prostate  . Heart disease Father   . Heart disease Maternal Grandfather   . Diabetes Paternal Grandfather   . Breast cancer Sister 71    Social History Social History   Tobacco Use  . Smoking status: Never Smoker  . Smokeless tobacco: Never Used  Vaping Use  . Vaping Use: Never used  Substance Use Topics  . Alcohol use: Not Currently    Alcohol/week: 0.0 standard drinks  . Drug use: No    Review of Systems  Constitutional: No fever/chills Eyes: No visual changes. ENT: No sore throat. Respiratory: Denies cough Cardiovascular: Denies chest pain Gastrointestinal: Denies abdominal pain Genitourinary: Negative for dysuria. Musculoskeletal: Positive for back pain. Skin: Negative for rash. Psychiatric: no mood changes,     ____________________________________________   PHYSICAL EXAM:  VITAL SIGNS: ED Triage Vitals  Enc Vitals Group     BP 08/18/19 2209 (!) 158/80     Pulse Rate 08/18/19 2209 69     Resp 08/18/19 2300 (!) 22     Temp --      Temp src --      SpO2 08/18/19 2209 96 %     Weight 08/18/19 2210 211 lb 6.4 oz (95.9 kg)     Height 08/18/19 2210 5' 5.5" (1.664 m)     Head Circumference --      Peak Flow --      Pain Score 08/18/19 2210 8     Pain Loc --      Pain Edu? --      Excl. in [place]? --     Constitutional: Alert and oriented. Well appearing and in no acute distress. Eyes: Conjunctivae are normal.  Head: Atraumatic. Nose: No  congestion/rhinnorhea. Mouth/Throat: Mucous membranes are moist.   Neck:  supple no lymphadenopathy noted Cardiovascular: Normal rate, regular rhythm.  Respiratory: Normal respiratory effort.  No retractions, GU: deferred Musculoskeletal: Lumbar spines tender, decreased range of motion of the left great toe, very weak dorsiflexion of the toe.  Neurovascular is intact neurologic:  Normal speech and language.  Skin:  Skin is warm, dry and intact. No rash noted. Psychiatric: Mood and affect are normal. Speech and behavior are normal.  ____________________________________________   LABS (all labs ordered are listed, but only abnormal results are displayed)  Labs  Reviewed - No data to display ____________________________________________   ____________________________________________  RADIOLOGY  MRI of the lumbar spine  ____________________________________________   PROCEDURES  Procedure(s) performed: No  Procedures    ____________________________________________   INITIAL IMPRESSION / ASSESSMENT AND PLAN / ED COURSE  Pertinent labs & imaging results that were available during my care of the patient were reviewed by me and considered in my medical decision making (see chart for details).   Patient is a 54 year old female with history of A. fib and chronic back pain that presents with new onset of severe back pain with radiation to the left leg and decreased dorsiflexion of the left great toe.  Physical exam shows patient to appear stable.  She does have a weak dorsiflexion of the left great toe.  Patient is very uncomfortable with movement.  She turned pale and quite and sweaty with movement.  Remainder the exam is unremarkable  DDx: Lumbar radiculopathy, sciatica, ruptured disc, cauda equina  MRI of the lumbar spine ordered. IV was placed in the patient.  She was given 1 mg of Ativan, 10 of Decadron, and 50 of fentanyl prior to MRI.  Patient will be moved to major at  this time.  Report was given to the doctor on a side.   IVORY MADURO was evaluated in Emergency Department on 08/18/2019 for the symptoms described in the history of present illness. She was evaluated in the context of the global COVID-19 pandemic, which necessitated consideration that the patient might be at risk for infection with the SARS-CoV-2 virus that causes COVID-19. Institutional protocols and algorithms that pertain to the evaluation of patients at risk for COVID-19 are in a state of rapid change based on information released by regulatory bodies including the CDC and federal and state organizations. These policies and algorithms were followed during the patient's care in the ED.   As part of my medical decision making, I reviewed the following data within the [place] History obtained from family, Nursing notes reviewed and incorporated, Old chart reviewed, Evaluated by EM attending , Notes from prior ED visits and [place] Controlled Substance Database  ____________________________________________   FINAL CLINICAL IMPRESSION(S) / ED DIAGNOSES  Final diagnoses:  Acute midline low back pain with left-sided sciatica      NEW MEDICATIONS STARTED DURING THIS VISIT:  New Prescriptions   No medications on file     Note:  This document was prepared using Dragon voice recognition software and may include unintentional dictation errors.    Versie Starks, PA-C 08/18/19 2341    Earleen Newport, MD 08/18/19 [phone]

## 2019-08-19 DIAGNOSIS — M545 Low back pain: Secondary | ICD-10-CM | POA: Diagnosis not present

## 2019-08-19 MED ORDER — KETOROLAC TROMETHAMINE 30 MG/ML IJ SOLN
10.0000 mg | Freq: Once | INTRAMUSCULAR | Status: AC
  Administered 2019-08-19: 9.9 mg via INTRAVENOUS
  Filled 2019-08-19: qty 1

## 2019-08-19 MED ORDER — DIAZEPAM 2 MG PO TABS: 2.0000 mg | ORAL_TABLET | Freq: Three times a day (TID) | ORAL | 0 refills | Status: DC | PRN

## 2019-08-19 MED ORDER — OXYCODONE-ACETAMINOPHEN 5-325 MG PO TABS: 1.0000 | ORAL_TABLET | ORAL | 0 refills | Status: DC | PRN

## 2019-08-19 MED ORDER — METHYLPREDNISOLONE 4 MG PO TBPK: ORAL_TABLET | ORAL | 0 refills | Status: DC

## 2019-08-19 MED ORDER — DIAZEPAM 2 MG PO TABS
2.0000 mg | ORAL_TABLET | Freq: Once | ORAL | Status: AC
  Administered 2019-08-19: 2 mg via ORAL
  Filled 2019-08-19: qty 1

## 2019-08-19 MED ORDER — ONDANSETRON HCL 4 MG/2ML IJ SOLN
4.0000 mg | Freq: Once | INTRAMUSCULAR | Status: AC
  Administered 2019-08-19: 4 mg via INTRAVENOUS
  Filled 2019-08-19: qty 2

## 2019-08-19 MED ORDER — OXYCODONE-ACETAMINOPHEN 5-325 MG PO TABS
1.0000 | ORAL_TABLET | Freq: Once | ORAL | Status: AC
  Administered 2019-08-19: 1 via ORAL
  Filled 2019-08-19: qty 1

## 2019-08-19 MED ORDER — ONDANSETRON 4 MG PO TBDP: 4.0000 mg | ORAL_TABLET | Freq: Three times a day (TID) | ORAL | 0 refills | Status: DC | PRN

## 2019-08-19 NOTE — ED Notes (Signed)
Report to rebekah, rn.  

## 2019-08-19 NOTE — ED Provider Notes (Signed)
-----------------------------------------   12:58 AM on 08/19/2019 -----------------------------------------  1. New large central disc extrusion with inferior migration at L5-S1  causing left greater than right lateral recess stenosis with mass  effect on the left S1 nerve root.  2. Moderate bilateral L4-5 foraminal stenosis, unchanged.   Updated patient and spouse of MRI results. Offered admission for pain control. Patient adamantly does not want another back surgery. Does not want admission for pain control. Will give oral medications now. Patient will follow up with her neurosurgeon next week. Strict return precautions given. Both verbalize understanding and agree with plan of care.   Paulette Blanch, MD 08/19/19 Delrae Rend

## 2019-08-19 NOTE — Discharge Instructions (Addendum)
1.  Take steroid taper as prescribed (Medrol Dosepak). 2.  You may take Ibuprofen as needed for pain, Percocet as needed for more severe pain. 3.  You may take Valium as needed for muscle relaxation. 4.  You may take Zofran as needed for nausea. 5.  Return to the ER for worsening symptoms, leg weakness, losing control of your bowel or bladder, or other concerns.

## 2019-08-23 ENCOUNTER — Other Ambulatory Visit: Payer: Self-pay

## 2019-08-23 ENCOUNTER — Encounter: Payer: Self-pay | Admitting: Internal Medicine

## 2019-08-23 ENCOUNTER — Ambulatory Visit: Payer: BC Managed Care – PPO | Admitting: Internal Medicine

## 2019-08-23 DIAGNOSIS — M5442 Lumbago with sciatica, left side: Secondary | ICD-10-CM

## 2019-08-23 DIAGNOSIS — I480 Paroxysmal atrial fibrillation: Secondary | ICD-10-CM

## 2019-08-23 DIAGNOSIS — I10 Essential (primary) hypertension: Secondary | ICD-10-CM | POA: Diagnosis not present

## 2019-08-23 DIAGNOSIS — G4733 Obstructive sleep apnea (adult) (pediatric): Secondary | ICD-10-CM | POA: Diagnosis not present

## 2019-08-23 DIAGNOSIS — E1165 Type 2 diabetes mellitus with hyperglycemia: Secondary | ICD-10-CM | POA: Diagnosis not present

## 2019-08-23 DIAGNOSIS — K760 Fatty (change of) liver, not elsewhere classified: Secondary | ICD-10-CM

## 2019-08-23 DIAGNOSIS — E782 Mixed hyperlipidemia: Secondary | ICD-10-CM

## 2019-08-23 DIAGNOSIS — E538 Deficiency of other specified B group vitamins: Secondary | ICD-10-CM

## 2019-08-23 NOTE — Progress Notes (Signed)
Patient ID: Joy Houston, female   DOB: Apr 14, 1965, 54 y.o.   MRN: [id number]   Subjective:    Patient ID: Joy Houston, female    DOB: [birth year], 54 y.o.   MRN: [id number]  HPI This visit occurred during the SARS-CoV-2 public health emergency.  Safety protocols were in place, including screening questions prior to the visit, additional usage of staff PPE, and extensive cleaning of exam room while observing appropriate contact time as indicated for disinfecting solutions.  Patient here for a scheduled follow up.  Went to [place] last week.  Did a lot of walking and rode bikes.  Developed increased low back pain and pain radiating into her left leg.  Has a history of back issues with previous back surgeries.  Was seen in ER 08/18/19.  MRI - new large central disc extrusion with mass effect - S1.  MRI report reviewed.  Was given fentanyl, decadron and ativan.  Discharged with prednisone taper.  Plans to f/u with Dr Arnoldo Morale next week.  Has been doing better with adjusting her diet.  Has lost weight.  Is still having issues with increased heart rate / palpitations - afib.  No nausea or vomiting.  Bowels moving.  Sugars doing better.  Seeing Dr Julien Nordmann - [facility].  Just had labs drawn.  Will await for results.    Past Medical History:  Diagnosis Date  . Anemia   . Atrial fibrillation ([place])   . Complication of anesthesia   . Diabetes mellitus without complication (HCC)    diet controlled  . Eczema   . Family history of anesthesia complication    Mother - confusion  . Fatty liver   . Heart murmur    Slight - "nothing to worry about"  . Hemorrhoid   . Hypertension   . PONV (postoperative nausea and vomiting)   . Sleep apnea    mild   Past Surgical History:  Procedure Laterality Date  . CESAREAN SECTION    . CHOLECYSTECTOMY N/A 09/07/2018   Procedure: LAPAROSCOPIC CHOLECYSTECTOMY;  Surgeon: Herbert Pun, MD;  Location: [facility] ORS;  Service: General;   Laterality: N/A;  . FINGER SURGERY     pin to 3rd finger Right hand  . LUMBAR LAMINECTOMY/DECOMPRESSION MICRODISCECTOMY  12/15/2011   Procedure: LUMBAR LAMINECTOMY/DECOMPRESSION MICRODISCECTOMY 1 LEVEL;  Surgeon: Ophelia Charter, MD;  Location: [place] [facility]UR[facility] ORS;  Service: Neurosurgery;  Laterality: Right;  RIGHT Lumbar four-Five diskectomy  . MICRODISCECTOMY LUMBAR     L4-5  . WISDOM TOOTH EXTRACTION     Family History  Problem Relation Age of Onset  . GER disease Mother   . Diabetes Father   . Cancer Father        Melanoma, Prostate  . Heart disease Father   . Heart disease Maternal Grandfather   . Diabetes Paternal Grandfather   . Breast cancer Sister 63   Social History   Socioeconomic History  . Marital status: Married    Spouse name: Not on file  . Number of children: 2  . Years of education: Not on file  . Highest education level: Not on file  Occupational History    Employer: [employer]  Tobacco Use  . Smoking status: Never Smoker  . Smokeless tobacco: Never Used  Vaping Use  . Vaping Use: Never used  Substance and Sexual Activity  . Alcohol use: Not Currently    Alcohol/week: 0.0 standard drinks  . Drug use: No  . Sexual activity: Not on  file  Other Topics Concern  . Not on file  Social History Narrative   Nurse    Married   Social Determinants of Health   Financial Resource Strain:   . Difficulty of Paying Living Expenses:   Food Insecurity:   . Worried About Charity fundraiser in the Last Year:   . Arboriculturist in the Last Year:   Transportation Needs:   . Film/video editor (Medical):   Marland Kitchen Lack of Transportation (Non-Medical):   Physical Activity:   . Days of Exercise per Week:   . Minutes of Exercise per Session:   Stress:   . Feeling of Stress :   Social Connections:   . Frequency of Communication with Friends and Family:   . Frequency of Social Gatherings with Friends and Family:   . Attends Religious Services:   . Active  Member of Clubs or Organizations:   . Attends Archivist Meetings:   Marland Kitchen Marital Status:     Outpatient Encounter Medications as of 08/23/2019  Medication Sig  . ACETYLCYSTEINE PO Take by mouth.  . Cyanocobalamin (B-12) 1000 MCG SUBL   . diazepam (VALIUM) 2 MG tablet Take 1 tablet (2 mg total) by mouth every 8 (eight) hours as needed for muscle spasms.  Marland Kitchen EPINEPHrine 0.3 mg/0.3 mL IJ SOAJ injection Inject 0.3 mg into the muscle as needed for anaphylaxis.   Marland Kitchen glucose blood (CONTOUR NEXT TEST) test strip Use as instructed  . GLUTATHIONE PO   . Lancets (ONETOUCH ULTRASOFT) lancets Use as instructed to check blood sugars once daily  . methylPREDNISolone (MEDROL DOSEPAK) 4 MG TBPK tablet Take as directed  . metoprolol succinate (TOPROL-XL) 25 MG 24 hr tablet Take 25 mg by mouth daily.  . NON FORMULARY 1 tablet as needed Activated Charcoal  . NON FORMULARY by Subgaleal route Trizomalglutathine  . NON FORMULARY   . NON FORMULARY   . ondansetron (ZOFRAN ODT) 4 MG disintegrating tablet Take 1 tablet (4 mg total) by mouth every 8 (eight) hours as needed for nausea or vomiting.  Marland Kitchen oxyCODONE-acetaminophen (PERCOCET/ROXICET) 5-325 MG tablet Take 1 tablet by mouth every 4 (four) hours as needed for severe pain.  . Syringe/Needle, Disp, (SYRINGE 3CC/25GX1") 25G X 1" 3 ML MISC Use as directed with b12 injections.   No facility-administered encounter medications on file as of 08/23/2019.    Review of Systems  Constitutional: Negative for appetite change and unexpected weight change.  HENT: Negative for congestion and sinus pressure.   Respiratory: Negative for cough and chest tightness.   Cardiovascular: Negative for chest pain and leg swelling.       Some increased heart rate/palpitations - afib - intermittent flares.  Some sob associated.    Gastrointestinal: Negative for abdominal pain, diarrhea, nausea and vomiting.  Genitourinary: Negative for difficulty urinating and dysuria.   Musculoskeletal: Negative for joint swelling and myalgias.  Skin: Negative for color change and rash.  Neurological: Negative for dizziness, light-headedness and headaches.  Psychiatric/Behavioral: Negative for agitation and dysphoric mood.       Objective:    Physical Exam Vitals reviewed.  Constitutional:      General: She is not in acute distress.    Appearance: Normal appearance.  HENT:     Head: Normocephalic and atraumatic.     Right Ear: External ear normal.     Left Ear: External ear normal.  Eyes:     General: No scleral icterus.  Right eye: No discharge.        Left eye: No discharge.     Conjunctiva/sclera: Conjunctivae normal.  Neck:     Thyroid: No thyromegaly.  Cardiovascular:     Rate and Rhythm: Normal rate and regular rhythm.  Pulmonary:     Effort: No respiratory distress.     Breath sounds: Normal breath sounds. No wheezing.  Abdominal:     General: Bowel sounds are normal.     Palpations: Abdomen is soft.     Tenderness: There is no abdominal tenderness.  Musculoskeletal:        General: No swelling or tenderness.     Cervical back: Neck supple. No tenderness.  Lymphadenopathy:     Cervical: No cervical adenopathy.  Skin:    Findings: No erythema or rash.  Neurological:     Mental Status: She is alert.  Psychiatric:        Mood and Affect: Mood normal.        Behavior: Behavior normal.     BP 128/78   Pulse 83   Temp (!) 97.4 F (36.3 C)   Resp 16   Ht 5\' 6"  (1.676 m)   Wt 212 lb (96.2 kg)   SpO2 98%   BMI 34.22 kg/m  Wt Readings from Last 3 Encounters:  08/23/19 212 lb (96.2 kg)  08/18/19 211 lb 6.4 oz (95.9 kg)  06/25/19 217 lb (98.4 kg)     Lab Results  Component Value Date   WBC 11.3 (H) 05/06/2019   HGB 16.4 (H) 05/06/2019   HCT 48.8 (H) 05/06/2019   PLT 252 05/06/2019   GLUCOSE 130 (H) 05/06/2019   CHOL 187 03/25/2019   TRIG 93.0 03/25/2019   HDL 54.10 03/25/2019   LDLDIRECT 145.4 03/01/2013   LDLCALC 114  (H) 03/25/2019   ALT 28 03/25/2019   AST 18 03/25/2019   NA 137 05/06/2019   K 3.9 05/06/2019   CL 100 05/06/2019   CREATININE 0.75 05/06/2019   BUN 21 (H) 05/06/2019   CO2 25 05/06/2019   TSH 3.26 03/25/2019   HGBA1C 5.6 03/25/2019   MICROALBUR 0.9 05/06/2014    MR LUMBAR SPINE WO CONTRAST  Result Date: 08/19/2019 CLINICAL DATA:  Low back pain with decreased left lower extremity sensation. EXAM: MRI LUMBAR SPINE WITHOUT CONTRAST TECHNIQUE: Multiplanar, multisequence MR imaging of the lumbar spine was performed. No intravenous contrast was administered. COMPARISON:  None. FINDINGS: Segmentation:  Standard. Alignment:  Grade 1 anterolisthesis at L5-S1 Vertebrae:  No fracture, evidence of discitis, or bone lesion. Conus medullaris and cauda equina: Conus extends to the L2 level. Conus and cauda equina appear normal. Paraspinal and other soft tissues: Negative. Disc levels: T12-L1: Sagittal imaging only. Small central disc extrusion with minimal superior migration. No stenosis. L1-2: Normal. L2-3: Disc desiccation and small bulge without spinal canal or neural foraminal stenosis. L3-4: Disc desiccation and small bulge with mild facet hypertrophy. No spinal canal or neural foraminal stenosis. L4-5: Moderate facet hypertrophy and postsurgical changes of prior left laminectomy. No spinal canal stenosis. No residual disc extrusion. Moderate bilateral foraminal stenosis is unchanged. L5-S1: There is a new large central disc extrusion with inferior migration. This causes left greater than right lateral recess stenosis with mass effect on the left S1 nerve root. There is unchanged mild bilateral foraminal stenosis. IMPRESSION: 1. New large central disc extrusion with inferior migration at L5-S1 causing left greater than right lateral recess stenosis with mass effect on the left S1 nerve root.  2. Moderate bilateral L4-5 foraminal stenosis, unchanged. Electronically Signed   By: Ulyses Jarred M.D.   On:  08/19/2019 00:16       Assessment & Plan:   Problem List Items Addressed This Visit    B12 deficiency    Receiving B12 injections.        Fatty liver    Diet, exercise and weight loss.  Follow liver function tests.        Hyperlipidemia, mixed    Low cholesterol diet and exercise.  Follow lipid panel.       Hypertension    Blood pressure as outlined.  Doing well.  Continue metoprolol.  Follow pressures.  Follow metabolic panel.       Low back pain    Has a history of back pain. Previous back surgeries x 2.  Increased pain recently as outlined.  MRI reviewed.  On steroid taper now.  Pain is some better.  Still limited in her activity.  Planning to f/u with Dr Arnoldo Morale next week.        OSA (obstructive sleep apnea)    Has adjusted diet and lost weight.  Follow.        Paroxysmal A-fib (HCC)    Persistent intermittent afib.  Some associated sob.  On metoprolol.  Followed by cardiology.  Has adjusted diet.  Lost weight.  Continue f/u with cardiology.        Type 2 diabetes mellitus with hyperglycemia (HCC)    Has adjusted her diet.  Lost weight.  Follow met b and a1c.           Einar Pheasant, MD

## 2019-08-25 ENCOUNTER — Encounter: Payer: Self-pay | Admitting: Internal Medicine

## 2019-08-25 DIAGNOSIS — M545 Low back pain, unspecified: Secondary | ICD-10-CM | POA: Insufficient documentation

## 2019-08-25 NOTE — Assessment & Plan Note (Signed)
Low cholesterol diet and exercise.  Follow lipid panel.   

## 2019-08-25 NOTE — Assessment & Plan Note (Signed)
Blood pressure as outlined.  Doing well.  Continue metoprolol.  Follow pressures.  Follow metabolic panel.

## 2019-08-25 NOTE — Assessment & Plan Note (Signed)
Has a history of back pain. Previous back surgeries x 2.  Increased pain recently as outlined.  MRI reviewed.  On steroid taper now.  Pain is some better.  Still limited in her activity.  Planning to f/u with Dr Arnoldo Morale next week.

## 2019-08-25 NOTE — Assessment & Plan Note (Signed)
Receiving B12 injections.  °

## 2019-08-25 NOTE — Assessment & Plan Note (Signed)
Has adjusted diet and lost weight.  Follow.

## 2019-08-25 NOTE — Assessment & Plan Note (Signed)
Diet, exercise and weight loss. Follow liver function tests.   

## 2019-08-25 NOTE — Assessment & Plan Note (Signed)
Persistent intermittent afib.  Some associated sob.  On metoprolol.  Followed by cardiology.  Has adjusted diet.  Lost weight.  Continue f/u with cardiology.

## 2019-08-25 NOTE — Assessment & Plan Note (Signed)
Has adjusted her diet.  Lost weight.  Follow met b and a1c.  

## 2019-08-30 DIAGNOSIS — Z6835 Body mass index (BMI) 35.0-35.9, adult: Secondary | ICD-10-CM | POA: Diagnosis not present

## 2019-08-30 DIAGNOSIS — M21372 Foot drop, left foot: Secondary | ICD-10-CM | POA: Diagnosis not present

## 2019-08-30 DIAGNOSIS — M5127 Other intervertebral disc displacement, lumbosacral region: Secondary | ICD-10-CM | POA: Diagnosis not present

## 2019-08-30 DIAGNOSIS — I10 Essential (primary) hypertension: Secondary | ICD-10-CM | POA: Diagnosis not present

## 2019-09-05 DIAGNOSIS — R002 Palpitations: Secondary | ICD-10-CM | POA: Diagnosis not present

## 2019-09-05 DIAGNOSIS — B379 Candidiasis, unspecified: Secondary | ICD-10-CM | POA: Diagnosis not present

## 2019-09-05 DIAGNOSIS — K589 Irritable bowel syndrome without diarrhea: Secondary | ICD-10-CM | POA: Diagnosis not present

## 2019-09-05 DIAGNOSIS — R5383 Other fatigue: Secondary | ICD-10-CM | POA: Diagnosis not present

## 2019-09-10 ENCOUNTER — Encounter: Payer: Self-pay | Admitting: Internal Medicine

## 2019-09-10 NOTE — Telephone Encounter (Signed)
Need office note and labs from her recent visit.  Please hold until we receive information

## 2019-09-16 DIAGNOSIS — M5127 Other intervertebral disc displacement, lumbosacral region: Secondary | ICD-10-CM | POA: Diagnosis not present

## 2019-09-16 DIAGNOSIS — I10 Essential (primary) hypertension: Secondary | ICD-10-CM | POA: Diagnosis not present

## 2019-09-16 DIAGNOSIS — Z6835 Body mass index (BMI) 35.0-35.9, adult: Secondary | ICD-10-CM | POA: Diagnosis not present

## 2019-09-16 DIAGNOSIS — M21372 Foot drop, left foot: Secondary | ICD-10-CM | POA: Diagnosis not present

## 2019-09-19 ENCOUNTER — Other Ambulatory Visit: Payer: Self-pay | Admitting: Neurosurgery

## 2019-10-07 ENCOUNTER — Encounter: Payer: Self-pay | Admitting: Internal Medicine

## 2019-10-07 ENCOUNTER — Other Ambulatory Visit: Payer: Self-pay | Admitting: Internal Medicine

## 2019-10-09 NOTE — Telephone Encounter (Signed)
Ok to refill B12 injections - 1053mcg q 2 weeks x 6 months.

## 2019-10-11 ENCOUNTER — Other Ambulatory Visit: Payer: Self-pay

## 2019-10-11 ENCOUNTER — Other Ambulatory Visit: Payer: Self-pay | Admitting: Neurosurgery

## 2019-10-11 MED ORDER — CYANOCOBALAMIN 1000 MCG/ML IJ SOLN
INTRAMUSCULAR | 2 refills | Status: DC
Start: 2019-10-11 — End: 2019-10-31

## 2019-10-11 NOTE — Telephone Encounter (Signed)
Refilled

## 2019-10-16 DIAGNOSIS — R0602 Shortness of breath: Secondary | ICD-10-CM | POA: Diagnosis not present

## 2019-10-16 DIAGNOSIS — I480 Paroxysmal atrial fibrillation: Secondary | ICD-10-CM | POA: Diagnosis not present

## 2019-10-16 DIAGNOSIS — I10 Essential (primary) hypertension: Secondary | ICD-10-CM | POA: Diagnosis not present

## 2019-10-16 DIAGNOSIS — E782 Mixed hyperlipidemia: Secondary | ICD-10-CM | POA: Diagnosis not present

## 2019-10-16 DIAGNOSIS — G4733 Obstructive sleep apnea (adult) (pediatric): Secondary | ICD-10-CM | POA: Diagnosis not present

## 2019-10-21 ENCOUNTER — Encounter: Payer: Self-pay | Admitting: Internal Medicine

## 2019-10-22 ENCOUNTER — Encounter: Payer: Self-pay | Admitting: Internal Medicine

## 2019-10-22 ENCOUNTER — Ambulatory Visit: Payer: BC Managed Care – PPO | Admitting: Internal Medicine

## 2019-10-22 ENCOUNTER — Other Ambulatory Visit: Payer: Self-pay

## 2019-10-22 DIAGNOSIS — I10 Essential (primary) hypertension: Secondary | ICD-10-CM | POA: Diagnosis not present

## 2019-10-22 DIAGNOSIS — I480 Paroxysmal atrial fibrillation: Secondary | ICD-10-CM

## 2019-10-22 DIAGNOSIS — E1165 Type 2 diabetes mellitus with hyperglycemia: Secondary | ICD-10-CM | POA: Diagnosis not present

## 2019-10-22 DIAGNOSIS — E538 Deficiency of other specified B group vitamins: Secondary | ICD-10-CM

## 2019-10-22 DIAGNOSIS — E782 Mixed hyperlipidemia: Secondary | ICD-10-CM

## 2019-10-22 DIAGNOSIS — K760 Fatty (change of) liver, not elsewhere classified: Secondary | ICD-10-CM

## 2019-10-22 DIAGNOSIS — M5442 Lumbago with sciatica, left side: Secondary | ICD-10-CM

## 2019-10-22 NOTE — Progress Notes (Signed)
Patient ID: Joy Houston, female   DOB: [birth year], 54 y.o.   MRN: [id number]   Subjective:    Patient ID: Joy Houston, female    DOB: [birth year], 54 y.o.   MRN: [id number]  HPI This visit occurred during the SARS-CoV-2 public health emergency.  Safety protocols were in place, including screening questions prior to the visit, additional usage of staff PPE, and extensive cleaning of exam room while observing appropriate contact time as indicated for disinfecting solutions.  Patient here for a scheduled follow up. She is having persistent problems with her back.  Planning for back surgery 11/04/19.  Has seen cardiology recently for f/u afib and pre op evaluation.  See note.  Note reviewed.  Also recommended EP consultation for discussion of ablation after full recovery from her back surgery.  Increased stress related to the above and her work situation.  Discussed with her today.  Has good support.  Does not feel needs any further intervention at this time.  Discussed diet and exercise.  No abdominal pain or bowel change reported.  Had questions about recent outside labs and supplements.     Past Medical History:  Diagnosis Date  . Anemia   . Angio-edema   . Atrial fibrillation ([place])   . Complication of anesthesia   . Diabetes mellitus without complication (HCC)    diet controlled  . Eczema   . Family history of anesthesia complication    Mother - confusion  . Fatty liver   . Heart murmur    Slight - "nothing to worry about"  . Hemorrhoid   . Hypertension   . PONV (postoperative nausea and vomiting)   . Sleep apnea    mild   Past Surgical History:  Procedure Laterality Date  . CESAREAN SECTION    . CHOLECYSTECTOMY N/A 09/07/2018   Procedure: LAPAROSCOPIC CHOLECYSTECTOMY;  Surgeon: Herbert Pun, MD;  Location: [facility] ORS;  Service: General;  Laterality: N/A;  . FINGER SURGERY     pin to 3rd finger Right hand  . LUMBAR LAMINECTOMY/DECOMPRESSION MICRODISCECTOMY   12/15/2011   Procedure: LUMBAR LAMINECTOMY/DECOMPRESSION MICRODISCECTOMY 1 LEVEL;  Surgeon: Ophelia Charter, MD;  Location: [facility];  Service: Neurosurgery;  Laterality: Right;  RIGHT Lumbar four-Five diskectomy  . MICRODISCECTOMY LUMBAR     L4-5  . WISDOM TOOTH EXTRACTION     Family History  Problem Relation Age of Onset  . GER disease Mother   . Diabetes Father   . Cancer Father        Melanoma, Prostate  . Heart disease Father   . Heart disease Maternal Grandfather   . Diabetes Paternal Grandfather   . Breast cancer Sister 80   Social History   Socioeconomic History  . Marital status: Married    Spouse name: Not on file  . Number of children: 2  . Years of education: Not on file  . Highest education level: Not on file  Occupational History    Employer: [employer]  Tobacco Use  . Smoking status: Never Smoker  . Smokeless tobacco: Never Used  Vaping Use  . Vaping Use: Never used  Substance and Sexual Activity  . Alcohol use: Not Currently    Alcohol/week: 0.0 standard drinks  . Drug use: No  . Sexual activity: Not on file  Other Topics Concern  . Not on file  Social History Narrative   Nurse    Married   Social Determinants of Health   Financial Resource Strain:   .  Difficulty of Paying Living Expenses: Not on file  Food Insecurity:   . Worried About Charity fundraiser in the Last Year: Not on file  . Ran Out of Food in the Last Year: Not on file  Transportation Needs:   . Lack of Transportation (Medical): Not on file  . Lack of Transportation (Non-Medical): Not on file  Physical Activity:   . Days of Exercise per Week: Not on file  . Minutes of Exercise per Session: Not on file  Stress:   . Feeling of Stress : Not on file  Social Connections:   . Frequency of Communication with Friends and Family: Not on file  . Frequency of Social Gatherings with Friends and Family: Not on file  . Attends Religious Services: Not on file  . Active Member  of Clubs or Organizations: Not on file  . Attends Archivist Meetings: Not on file  . Marital Status: Not on file    Outpatient Encounter Medications as of 10/22/2019  Medication Sig  . cyanocobalamin (,VITAMIN B-12,) 1000 MCG/ML injection Inject 1 ml into the muscle every 2 weeks.  . Digestive Enzymes (SIMILASE PO) Take 1 tablet by mouth 3 (three) times daily with meals.  Marland Kitchen EPINEPHrine 0.3 mg/0.3 mL IJ SOAJ injection Inject 0.3 mg into the muscle as needed for anaphylaxis.   Marland Kitchen GLUTATHIONE PO Take 7 Pump by mouth daily.   . Methylsulfonylmethane (MSM) 1000 MG TABS Take 1,000 mg by mouth daily.  . metoprolol succinate (TOPROL-XL) 25 MG 24 hr tablet Take 25 mg by mouth daily.  . Nutritional Supplements (DHEA PO) Take 1 capsule by mouth daily.  . Syringe/Needle, Disp, (SYRINGE 3CC/25GX1") 25G X 1" 3 ML MISC Use as directed with b12 injections.  Marland Kitchen thyroid (NP THYROID) 15 MG tablet Take 15 mg by mouth daily.  . valACYclovir (VALTREX) 1000 MG tablet Take 1,000 mg by mouth daily.  . Vitamin D-Vitamin K (VITAMIN K2-VITAMIN D3 PO) Take 1 tablet by mouth daily.  . [DISCONTINUED] ACETYLCYSTEINE PO Take by mouth. (Patient not taking: Reported on 10/22/2019)  . [DISCONTINUED] Cyanocobalamin (B-12) 1000 MCG SUBL  (Patient not taking: Reported on 10/22/2019)  . [DISCONTINUED] diazepam (VALIUM) 2 MG tablet Take 1 tablet (2 mg total) by mouth every 8 (eight) hours as needed for muscle spasms. (Patient not taking: Reported on 10/22/2019)  . [DISCONTINUED] glucose blood (CONTOUR NEXT TEST) test strip Use as instructed (Patient not taking: Reported on 10/25/2019)  . [DISCONTINUED] Lancets (ONETOUCH ULTRASOFT) lancets Use as instructed to check blood sugars once daily (Patient not taking: Reported on 10/25/2019)  . [DISCONTINUED] methylPREDNISolone (MEDROL DOSEPAK) 4 MG TBPK tablet Take as directed (Patient not taking: Reported on 10/22/2019)  . [DISCONTINUED] NON FORMULARY 1 tablet as needed Activated Charcoal  (Patient not taking: Reported on 10/22/2019)  . [DISCONTINUED] NON FORMULARY by Subgaleal route Trizomalglutathine (Patient not taking: Reported on 10/22/2019)  . [DISCONTINUED] NON FORMULARY  (Patient not taking: Reported on 10/22/2019)  . [DISCONTINUED] NON FORMULARY  (Patient not taking: Reported on 10/22/2019)  . [DISCONTINUED] ondansetron (ZOFRAN ODT) 4 MG disintegrating tablet Take 1 tablet (4 mg total) by mouth every 8 (eight) hours as needed for nausea or vomiting. (Patient not taking: Reported on 10/22/2019)  . [DISCONTINUED] oxyCODONE-acetaminophen (PERCOCET/ROXICET) 5-325 MG tablet Take 1 tablet by mouth every 4 (four) hours as needed for severe pain. (Patient not taking: Reported on 10/22/2019)   No facility-administered encounter medications on file as of 10/22/2019.    Review of Systems  Constitutional:  Negative for appetite change and unexpected weight change.  HENT: Negative for congestion and sinus pressure.   Respiratory: Negative for cough, chest tightness and shortness of breath.   Cardiovascular: Negative for chest pain and leg swelling.       Intermittent episodes of increased heart rate.  Seeing cardiology - paroxysmal afib.    Gastrointestinal: Negative for abdominal pain, diarrhea, nausea and vomiting.  Genitourinary: Negative for difficulty urinating and dysuria.  Musculoskeletal: Negative for joint swelling and myalgias.  Skin: Negative for color change and rash.  Neurological: Negative for dizziness, light-headedness and headaches.  Psychiatric/Behavioral: Negative for agitation and dysphoric mood.       Objective:    Physical Exam Vitals reviewed.  Constitutional:      General: She is not in acute distress.    Appearance: Normal appearance.  HENT:     Head: Normocephalic and atraumatic.     Right Ear: External ear normal.     Left Ear: External ear normal.  Eyes:     General: No scleral icterus.       Right eye: No discharge.        Left eye: No discharge.      Conjunctiva/sclera: Conjunctivae normal.  Neck:     Thyroid: No thyromegaly.  Cardiovascular:     Rate and Rhythm: Normal rate and regular rhythm.  Pulmonary:     Effort: No respiratory distress.     Breath sounds: Normal breath sounds. No wheezing.  Abdominal:     General: Bowel sounds are normal.     Palpations: Abdomen is soft.     Tenderness: There is no abdominal tenderness.  Musculoskeletal:        General: No swelling or tenderness.     Cervical back: Neck supple. No tenderness.  Lymphadenopathy:     Cervical: No cervical adenopathy.  Skin:    Findings: No erythema or rash.  Neurological:     Mental Status: She is alert.  Psychiatric:        Mood and Affect: Mood normal.        Behavior: Behavior normal.     BP 136/78   Pulse 78   Temp 98.5 F (36.9 C) (Oral)   Resp 16   Ht 5' 6"  (1.676 m)   Wt 217 lb (98.4 kg)   SpO2 98%   BMI 35.02 kg/m  Wt Readings from Last 3 Encounters:  10/22/19 217 lb (98.4 kg)  08/23/19 212 lb (96.2 kg)  08/18/19 211 lb 6.4 oz (95.9 kg)     Lab Results  Component Value Date   WBC 11.3 (H) 05/06/2019   HGB 16.4 (H) 05/06/2019   HCT 48.8 (H) 05/06/2019   PLT 252 05/06/2019   GLUCOSE 130 (H) 05/06/2019   CHOL 187 03/25/2019   TRIG 93.0 03/25/2019   HDL 54.10 03/25/2019   LDLDIRECT 145.4 03/01/2013   LDLCALC 114 (H) 03/25/2019   ALT 28 03/25/2019   AST 18 03/25/2019   NA 137 05/06/2019   K 3.9 05/06/2019   CL 100 05/06/2019   CREATININE 0.75 05/06/2019   BUN 21 (H) 05/06/2019   CO2 25 05/06/2019   TSH 3.26 03/25/2019   HGBA1C 5.6 03/25/2019   MICROALBUR 0.9 05/06/2014    MR LUMBAR SPINE WO CONTRAST  Result Date: 08/19/2019 CLINICAL DATA:  Low back pain with decreased left lower extremity sensation. EXAM: MRI LUMBAR SPINE WITHOUT CONTRAST TECHNIQUE: Multiplanar, multisequence MR imaging of the lumbar spine was performed. No intravenous contrast was administered.  COMPARISON:  None. FINDINGS: Segmentation:  Standard.  Alignment:  Grade 1 anterolisthesis at L5-S1 Vertebrae:  No fracture, evidence of discitis, or bone lesion. Conus medullaris and cauda equina: Conus extends to the L2 level. Conus and cauda equina appear normal. Paraspinal and other soft tissues: Negative. Disc levels: T12-L1: Sagittal imaging only. Small central disc extrusion with minimal superior migration. No stenosis. L1-2: Normal. L2-3: Disc desiccation and small bulge without spinal canal or neural foraminal stenosis. L3-4: Disc desiccation and small bulge with mild facet hypertrophy. No spinal canal or neural foraminal stenosis. L4-5: Moderate facet hypertrophy and postsurgical changes of prior left laminectomy. No spinal canal stenosis. No residual disc extrusion. Moderate bilateral foraminal stenosis is unchanged. L5-S1: There is a new large central disc extrusion with inferior migration. This causes left greater than right lateral recess stenosis with mass effect on the left S1 nerve root. There is unchanged mild bilateral foraminal stenosis. IMPRESSION: 1. New large central disc extrusion with inferior migration at L5-S1 causing left greater than right lateral recess stenosis with mass effect on the left S1 nerve root. 2. Moderate bilateral L4-5 foraminal stenosis, unchanged. Electronically Signed   By: Ulyses Jarred M.D.   On: 08/19/2019 00:16       Assessment & Plan:   Problem List Items Addressed This Visit    Type 2 diabetes mellitus with hyperglycemia ([place])    Discussed diet and exercise.  She has adjusted her diet.  Low carb diet and exercise.  Follow met b and a1c.        Relevant Orders   Hemoglobin K2H   Basic metabolic panel   Microalbumin / creatinine urine ratio   Paroxysmal A-fib (HCC)    Persistent intermittent afib.  Just saw cardiology.  Continue metoprolol.  Planning for EP evaluation once she recovers from her back surgery.       Low back pain    Persistent.  Planning for back surgery 11/04/19.        Hypertension     Blood pressure as outlined.  Continue metoprolol.  Follow pressures.  Will need close intra op and post op monitoring of her heart rate and blood pressure to avoid extremes.        Relevant Orders   CBC with Differential/Platelet   TSH   Hyperlipidemia, mixed    Low cholesterol diet and exercise.  Follow lipid panel.       Relevant Orders   Lipid panel   Fatty liver    Diet, exercise and weight loss.  Follow liver function tests.        Relevant Orders   Hepatic function panel   B12 deficiency    Continue b12 injections.       Relevant Orders   Vitamin B12       Einar Pheasant, MD

## 2019-10-23 ENCOUNTER — Telehealth: Payer: Self-pay | Admitting: Allergy & Immunology

## 2019-10-23 NOTE — Telephone Encounter (Signed)
Discussed with pt at appt 

## 2019-10-23 NOTE — Telephone Encounter (Signed)
Great - looking forward to it! She sounds awesome.  Salvatore Marvel, MD [facility]

## 2019-10-23 NOTE — Telephone Encounter (Signed)
Patient called back and is concerned about getting anything done before her surgery. Patient would like to talk to Dr. Ernst Bowler about everything, patient scheduled for televisit visit Friday 9/3 at 8:30am.

## 2019-10-23 NOTE — Telephone Encounter (Signed)
Patient was denied COVID vaccine exemption by [facility] and needs COVID component testing. Patient was exposed to mold and gas in the workplace which has messed with her immune system. No reactions to past vaccine, but Dr. Julien Nordmann at [facility] recommended patient not get the flu or covid vaccine.   Patient is having back surgery on 9/13 and would like to get in before then, but is concerned about getting the testing before surgery in case of any problems. There is an opening on Friday in [place] at 8:30am patient is willing to take.  Please advise if component testing can be scheduled.

## 2019-10-24 ENCOUNTER — Ambulatory Visit: Payer: BC Managed Care – PPO | Admitting: Dermatology

## 2019-10-25 ENCOUNTER — Ambulatory Visit (INDEPENDENT_AMBULATORY_CARE_PROVIDER_SITE_OTHER): Payer: BC Managed Care – PPO | Admitting: Allergy & Immunology

## 2019-10-25 ENCOUNTER — Other Ambulatory Visit: Payer: Self-pay

## 2019-10-25 ENCOUNTER — Encounter: Payer: Self-pay | Admitting: Internal Medicine

## 2019-10-25 ENCOUNTER — Encounter: Payer: Self-pay | Admitting: Allergy & Immunology

## 2019-10-25 DIAGNOSIS — I480 Paroxysmal atrial fibrillation: Secondary | ICD-10-CM | POA: Diagnosis not present

## 2019-10-25 DIAGNOSIS — R7982 Elevated C-reactive protein (CRP): Secondary | ICD-10-CM | POA: Diagnosis not present

## 2019-10-25 DIAGNOSIS — E039 Hypothyroidism, unspecified: Secondary | ICD-10-CM | POA: Diagnosis not present

## 2019-10-25 DIAGNOSIS — Z7189 Other specified counseling: Secondary | ICD-10-CM

## 2019-10-25 DIAGNOSIS — E538 Deficiency of other specified B group vitamins: Secondary | ICD-10-CM | POA: Diagnosis not present

## 2019-10-25 DIAGNOSIS — Z87898 Personal history of other specified conditions: Secondary | ICD-10-CM

## 2019-10-25 DIAGNOSIS — R5383 Other fatigue: Secondary | ICD-10-CM | POA: Diagnosis not present

## 2019-10-25 DIAGNOSIS — Z7185 Encounter for immunization safety counseling: Secondary | ICD-10-CM

## 2019-10-25 NOTE — Progress Notes (Signed)
RE: Joy Houston MRN: [id number] DOB: 07/29/1965 Date of Telemedicine Visit: 10/25/2019  Referring provider: No ref. provider found Primary care provider: Einar Pheasant, MD  Chief Complaint: Advice Only (Patient gave verbal consent to treat and bill insurance for this visit.)   Telemedicine Follow Up Visit via Telephone: I connected with Joy Houston for a follow up on 10/25/19 by telephone and verified that I am speaking with the correct person using two identifiers.   I discussed the limitations, risks, security and privacy concerns of performing an evaluation and management service by telephone and the availability of in person appointments. I also discussed with the patient that there may be a patient responsible charge related to this service. The patient expressed understanding and agreed to proceed.  Patient is at home.  Provider is at the office.  Visit start time: 8:42 AM Visit end time: 9:12 AM Insurance consent/check in by: [facility] consent and medical assistant/nurse: Joy Houston  History of Present Illness:  She is a 54 y.o. female presenting for an evaluation of a COVID-19 vaccine exemption.  Her history is rather complicated and a bit difficult to piece together.  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  She previously worked in person in an office.  Unfortunately, in that setting became unsafe for her as mold started to grow and she developed worsening fatigue and malaise.  Sometime during this episode she was exposed to gas as well.  I believe this was in the form of [place] from a bathroom installation.  Regardless, the conditions became so has a risk to herself that she was able to transfer to her home to work remotely.  She said that there was some testing done in the work environment which confirmed mold exposure.  She does not have the details of this.  She does say that the mold was remediated by the landlord, but  she ended up continuing to work at home.  The malaise and fatigue continued and she eventually sought help from a functional medicine provider.  She tells me she is receiving therapy to help remove the toxins from her body.  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  In any case, her functional medicine doctor is managing all of this.  She tells me that her functional medicine provider recommended not getting the vaccine because the gas and the molds caused her to become immunosuppressed.  In addition to the above, she also has atrial fibrillation.  She is followed by Joy Houston at [facility].  She is currently on metoprolol for this.  They have talked about putting her on different medications, but she is worried about the side effects of other atrial fibrillation medication such as flecainide.  She has been referred to Joy Houston for a possible heart ablation.  She is not sure she is going to go through with this, but she is going for a consultation visit nonetheless.  Ideally she would like to get off of all of her medications.  Evidently, her latest ejection fraction was normal.  She has episodes of atrial fibrillation nearly daily, despite the metoprolol.  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  This clearly has improved with cessation of lisinopril that she was on.  She does not really have an allergic history.  She has tolerated all of  her previous vaccinations without adverse event.  She believes and vaccines and is open to getting it, but she would just like her health in a better state before she decides to go ahead with the vaccine.  She did submit for a medical exemption, but the board requested additional information.  She talked to Joy Houston in [facility] health, who recommended that she talk to me about her immune system and her concerns with the vaccine.   Email :  [email or web address]  Otherwise, there have been no changes to her past medical history, surgical history, family history, or social history.  Assessment and Plan:  Joy Houston is a 54 y.o. female with:  Vaccine counseling  Angioedema - improved  Paroxysmal A-fib - currently on a beta-blocker (metoprolol)  Complicated past medical history  Followed by Functional Medicine    Joy Houston presents to discuss her concerns with the COVID-19 vaccine.  While at this point I do not see an inherent reason to not get the vaccine eventually, I can see her point of wanting to get her other medical issues under better control before getting the vaccine.  In particular, her history of atrial fibrillation and her current treatment with metoprolol would make it difficult to treat anaphylaxis if she received the vaccine, even in the office setting.  Metoprolol blocks the activity of epinephrine, which obviously would be the treatment of choice for anaphylaxis.  Therefore, if we can get her off of metoprolol, this would remove a barrier to her safely receiving the COVID-19 vaccine. I think it would be reasonable to hold off on the vaccine and revisit the issue in 3 months to see what happens with her visit to Joy Houston and treatment of her atrial fibrillation.   Regarding her functional medicine issues, I cannot speak to those.  Much of what functional medicine addresses is not evidence-based and not peer-reviewed.  I have not been trained in that at all.    Diagnostics: None.  Medication List:  Current Outpatient Medications  Medication Sig Dispense Refill  . cyanocobalamin (,VITAMIN B-12,) 1000 MCG/ML injection Inject 1 ml into the muscle every 2 weeks. 10 mL 2  . Digestive Enzymes (SIMILASE PO) Take 1 tablet by mouth 3 (three) times daily with meals.    Marland Kitchen EPINEPHrine 0.3 mg/0.3 mL IJ SOAJ injection Inject 0.3 mg into the muscle as needed for anaphylaxis.     Marland Kitchen GLUTATHIONE PO Take 7 Pump by mouth  daily.     . Methylsulfonylmethane (MSM) 1000 MG TABS Take 1,000 mg by mouth daily.    . metoprolol succinate (TOPROL-XL) 25 MG 24 hr tablet Take 25 mg by mouth daily.    . Nutritional Supplements (DHEA PO) Take 1 capsule by mouth daily.    . Syringe/Needle, Disp, (SYRINGE 3CC/25GX1") 25G X 1" 3 ML MISC Use as directed with b12 injections. 50 each 2  . thyroid (NP THYROID) 15 MG tablet Take 15 mg by mouth daily.    . valACYclovir (VALTREX) 1000 MG tablet Take 1,000 mg by mouth daily.    . Vitamin D-Vitamin K (VITAMIN K2-VITAMIN D3 PO) Take 1 tablet by mouth daily.     No current facility-administered medications for this visit.   Allergies: Allergies  Allergen Reactions  . Angiotensin Receptor Blockers Swelling    Angioedema Had past reaction of arb and angioedema with ace  . Lisinopril Swelling  . Morphine And Related Itching    Slight itching around nose area   I reviewed her past  medical history, social history, family history, and environmental history and no significant changes have been reported from previous visits.  Review of Systems  Constitutional: Positive for chills and fatigue. Negative for activity change and appetite change.  HENT: Negative for congestion, postnasal drip, rhinorrhea, sinus pressure, sore throat and voice change.   Eyes: Negative for pain, discharge, redness and itching.  Respiratory: Negative for shortness of breath, wheezing and stridor.   Gastrointestinal: Negative for diarrhea, nausea and vomiting.  Endocrine: Negative for cold intolerance and heat intolerance.  Musculoskeletal: Negative for arthralgias, joint swelling and myalgias.  Skin: Negative for rash.  Allergic/Immunologic: Negative for environmental allergies and food allergies.    Objective:  Physical exam not obtained as encounter was done via telephone.   Previous notes and tests were reviewed.  I discussed the assessment and treatment plan with the patient. The patient was  provided an opportunity to ask questions and all were answered. The patient agreed with the plan and demonstrated an understanding of the instructions.   The patient was advised to call back or seek an in-person evaluation if the symptoms worsen or if the condition fails to improve as anticipated.  I provided 30 minutes of non-face-to-face time during this encounter.  It was my pleasure to participate in Deerfield Frees's care today. Please feel free to contact me with any questions or concerns.   Sincerely,  Valentina Shaggy, MD

## 2019-10-25 NOTE — Telephone Encounter (Signed)
Called pt she was at another appt and will call back to scheduled labs and  a 7wk follow up

## 2019-10-28 ENCOUNTER — Encounter: Payer: Self-pay | Admitting: Internal Medicine

## 2019-10-28 NOTE — Assessment & Plan Note (Signed)
Discussed diet and exercise.  She has adjusted her diet.  Low carb diet and exercise.  Follow met b and a1c.

## 2019-10-28 NOTE — Assessment & Plan Note (Signed)
Blood pressure as outlined.  Continue metoprolol.  Follow pressures.  Will need close intra op and post op monitoring of her heart rate and blood pressure to avoid extremes.

## 2019-10-28 NOTE — Assessment & Plan Note (Signed)
Continue b12 injections.  

## 2019-10-28 NOTE — Assessment & Plan Note (Signed)
Persistent.  Planning for back surgery 11/04/19.

## 2019-10-28 NOTE — Assessment & Plan Note (Signed)
Diet, exercise and weight loss. Follow liver function tests.   

## 2019-10-28 NOTE — Assessment & Plan Note (Signed)
Low cholesterol diet and exercise.  Follow lipid panel.   

## 2019-10-28 NOTE — Assessment & Plan Note (Signed)
Persistent intermittent afib.  Just saw cardiology.  Continue metoprolol.  Planning for EP evaluation once she recovers from her back surgery.

## 2019-10-29 ENCOUNTER — Telehealth: Payer: Self-pay | Admitting: Internal Medicine

## 2019-10-29 DIAGNOSIS — M5416 Radiculopathy, lumbar region: Secondary | ICD-10-CM | POA: Diagnosis not present

## 2019-10-29 NOTE — Telephone Encounter (Signed)
Confirm with pt she has a vitamin D def.  If no, insurance may not cover and will need to sign waiver.

## 2019-10-29 NOTE — Telephone Encounter (Signed)
Patient wanted to know if she could add VIT D check to her labs

## 2019-10-29 NOTE — Telephone Encounter (Signed)
Are you ok to add?

## 2019-10-29 NOTE — Telephone Encounter (Signed)
Mychart message sent.

## 2019-10-30 ENCOUNTER — Other Ambulatory Visit (INDEPENDENT_AMBULATORY_CARE_PROVIDER_SITE_OTHER): Payer: BC Managed Care – PPO

## 2019-10-30 ENCOUNTER — Other Ambulatory Visit: Payer: Self-pay

## 2019-10-30 DIAGNOSIS — E538 Deficiency of other specified B group vitamins: Secondary | ICD-10-CM

## 2019-10-30 DIAGNOSIS — I10 Essential (primary) hypertension: Secondary | ICD-10-CM

## 2019-10-30 DIAGNOSIS — K760 Fatty (change of) liver, not elsewhere classified: Secondary | ICD-10-CM

## 2019-10-30 DIAGNOSIS — E1165 Type 2 diabetes mellitus with hyperglycemia: Secondary | ICD-10-CM | POA: Diagnosis not present

## 2019-10-30 DIAGNOSIS — E782 Mixed hyperlipidemia: Secondary | ICD-10-CM

## 2019-10-30 DIAGNOSIS — R197 Diarrhea, unspecified: Secondary | ICD-10-CM | POA: Diagnosis not present

## 2019-10-30 LAB — TSH: TSH: 3.53 u[IU]/mL (ref 0.35–4.50)

## 2019-10-30 LAB — CBC WITH DIFFERENTIAL/PLATELET
Basophils Absolute: 0.1 10*3/uL (ref 0.0–0.1)
Basophils Relative: 1.2 % (ref 0.0–3.0)
Eosinophils Absolute: 0.1 10*3/uL (ref 0.0–0.7)
Eosinophils Relative: 1.1 % (ref 0.0–5.0)
HCT: 45.6 % (ref 36.0–46.0)
Hemoglobin: 15.3 g/dL — ABNORMAL HIGH (ref 12.0–15.0)
Lymphocytes Relative: 31.6 % (ref 12.0–46.0)
Lymphs Abs: 2.4 10*3/uL (ref 0.7–4.0)
MCHC: 33.6 g/dL (ref 30.0–36.0)
MCV: 94.2 fl (ref 78.0–100.0)
Monocytes Absolute: 0.6 10*3/uL (ref 0.1–1.0)
Monocytes Relative: 7.4 % (ref 3.0–12.0)
Neutro Abs: 4.4 10*3/uL (ref 1.4–7.7)
Neutrophils Relative %: 58.7 % (ref 43.0–77.0)
Platelets: 203 10*3/uL (ref 150.0–400.0)
RBC: 4.84 Mil/uL (ref 3.87–5.11)
RDW: 14.2 % (ref 11.5–15.5)
WBC: 7.5 10*3/uL (ref 4.0–10.5)

## 2019-10-30 LAB — VITAMIN B12: Vitamin B-12: 1238 pg/mL — ABNORMAL HIGH (ref 211–911)

## 2019-10-30 LAB — MICROALBUMIN / CREATININE URINE RATIO
Creatinine,U: 194.4 mg/dL
Microalb Creat Ratio: 1 mg/g (ref 0.0–30.0)
Microalb, Ur: 1.9 mg/dL (ref 0.0–1.9)

## 2019-10-30 LAB — HEPATIC FUNCTION PANEL
ALT: 17 U/L (ref 0–35)
AST: 15 U/L (ref 0–37)
Albumin: 4.5 g/dL (ref 3.5–5.2)
Alkaline Phosphatase: 92 U/L (ref 39–117)
Bilirubin, Direct: 0.1 mg/dL (ref 0.0–0.3)
Total Bilirubin: 0.7 mg/dL (ref 0.2–1.2)
Total Protein: 7.3 g/dL (ref 6.0–8.3)

## 2019-10-30 LAB — BASIC METABOLIC PANEL
BUN: 12 mg/dL (ref 6–23)
CO2: 27 mEq/L (ref 19–32)
Calcium: 9.6 mg/dL (ref 8.4–10.5)
Chloride: 104 mEq/L (ref 96–112)
Creatinine, Ser: 0.67 mg/dL (ref 0.40–1.20)
GFR: 92.35 mL/min (ref >60.00)
Glucose, Bld: 88 mg/dL (ref 70–99)
Potassium: 4.1 mEq/L (ref 3.5–5.1)
Sodium: 139 mEq/L (ref 135–145)

## 2019-10-30 LAB — LIPID PANEL
Cholesterol: 158 mg/dL (ref 0–200)
HDL: 59.4 mg/dL (ref >39.00)
LDL Cholesterol: 86 mg/dL (ref 0–99)
NonHDL: 98.8
Total CHOL/HDL Ratio: 3
Triglycerides: 62 mg/dL (ref 0.0–149.0)
VLDL: 12.4 mg/dL (ref 0.0–40.0)

## 2019-10-30 LAB — HEMOGLOBIN A1C: Hgb A1c MFr Bld: 5.3 % (ref 4.6–6.5)

## 2019-10-31 ENCOUNTER — Other Ambulatory Visit: Payer: Self-pay

## 2019-10-31 ENCOUNTER — Inpatient Hospital Stay (HOSPITAL_COMMUNITY): Admission: RE | Admit: 2019-10-31 | Payer: BC Managed Care – PPO | Source: Ambulatory Visit

## 2019-10-31 ENCOUNTER — Other Ambulatory Visit (HOSPITAL_COMMUNITY): Payer: BC Managed Care – PPO

## 2019-10-31 ENCOUNTER — Other Ambulatory Visit: Payer: Self-pay | Admitting: Internal Medicine

## 2019-10-31 ENCOUNTER — Other Ambulatory Visit
Admission: RE | Admit: 2019-10-31 | Discharge: 2019-10-31 | Disposition: A | Discharge status: Home / Self Care | Payer: BC Managed Care – PPO | Source: Ambulatory Visit | Attending: Neurosurgery | Admitting: Neurosurgery

## 2019-10-31 ENCOUNTER — Other Ambulatory Visit (INDEPENDENT_AMBULATORY_CARE_PROVIDER_SITE_OTHER): Payer: BC Managed Care – PPO

## 2019-10-31 ENCOUNTER — Encounter (HOSPITAL_COMMUNITY)
Admission: RE | Admit: 2019-10-31 | Discharge: 2019-10-31 | Disposition: A | Discharge status: Home / Self Care | Payer: BC Managed Care – PPO | Source: Ambulatory Visit | Attending: Neurosurgery | Admitting: Neurosurgery

## 2019-10-31 ENCOUNTER — Encounter (HOSPITAL_COMMUNITY): Payer: Self-pay

## 2019-10-31 DIAGNOSIS — I480 Paroxysmal atrial fibrillation: Secondary | ICD-10-CM | POA: Diagnosis not present

## 2019-10-31 DIAGNOSIS — Z01812 Encounter for preprocedural laboratory examination: Secondary | ICD-10-CM | POA: Diagnosis not present

## 2019-10-31 DIAGNOSIS — R5383 Other fatigue: Secondary | ICD-10-CM | POA: Diagnosis not present

## 2019-10-31 DIAGNOSIS — Z20822 Contact with and (suspected) exposure to covid-19: Secondary | ICD-10-CM | POA: Insufficient documentation

## 2019-10-31 DIAGNOSIS — E1165 Type 2 diabetes mellitus with hyperglycemia: Secondary | ICD-10-CM

## 2019-10-31 HISTORY — DX: Hypothyroidism, unspecified: E03.9

## 2019-10-31 HISTORY — DX: Infectious mononucleosis, unspecified without complication: B27.90

## 2019-10-31 LAB — VITAMIN D 25 HYDROXY (VIT D DEFICIENCY, FRACTURES): VITD: 36.71 ng/mL (ref 30.00–100.00)

## 2019-10-31 LAB — SURGICAL PCR SCREEN
MRSA, PCR: NEGATIVE
Staphylococcus aureus: NEGATIVE

## 2019-10-31 LAB — T4, FREE: Free T4: 0.71 ng/dL (ref 0.60–1.60)

## 2019-10-31 LAB — GLUCOSE, CAPILLARY: Glucose-Capillary: 127 mg/dL — ABNORMAL HIGH (ref 70–99)

## 2019-10-31 LAB — T3, FREE: T3, Free: 3.2 pg/mL (ref 2.3–4.2)

## 2019-10-31 NOTE — Progress Notes (Signed)
Order placed for f/u labs.  

## 2019-10-31 NOTE — Progress Notes (Signed)
PCP - Dr. Einar Pheasant Cardiologist - Dr. Serafina Royals  PPM/ICD - n/a Device Orders -  Rep Notified -   Chest x-ray - 05/06/2019 EKG - 05/07/2019 Stress Test - patient denies ECHO - 10/16/2019 Cardiac Cath - patient denies  Sleep Study - 07/2018 CPAP -   Fasting Blood Sugar - unknown Checks Blood Sugar - patient does not check her CBG, only if feeling off, maybe once a month.  Blood Thinner Instructions: Aspirin Instructions:  ERAS Protcol - n/a PRE-SURGERY Ensure or G2-   COVID TEST- after PAT appointment   Anesthesia review: yes, cardiac history, recent echo  Patient denies shortness of breath, fever, cough and chest pain at PAT appointment   All instructions explained to the patient, with a verbal understanding of the material. Patient agrees to go over the instructions while at home for a better understanding. Patient also instructed to self quarantine after being tested for COVID-19. The opportunity to ask questions was provided.

## 2019-10-31 NOTE — Telephone Encounter (Signed)
Labs ordered.

## 2019-10-31 NOTE — Progress Notes (Signed)
Your procedure is scheduled on Monday November 04, 2019.  Report to [facility] Main Entrance "A" at 05:30 A.M., and check in at the Admitting office.  Call this number if you have problems the morning of surgery: [phone]  Call [phone] if you have any questions prior to your surgery date Monday-Friday 8am-4pm   Remember: Do not eat or drink after midnight the night before your surgery   Take these medicines the morning of surgery with A SIP OF WATER: metoprolol succinate (TOPROL-XL) thyroid (NP THYROID) valACYclovir (VALTREX)   If needed: EPINEPHrine 0.3 mg/0.3 mL IJ SOAJ injection  As of today, STOP taking any Aspirin (unless otherwise instructed by your surgeon), Aleve, Naproxen, Ibuprofen, Motrin, Advil, Goody's, BC's, all herbal medications, fish oil, and all vitamins.    The Morning of Surgery  Do not wear jewelry, make-up or nail polish.  Do not wear lotions, powders, or perfumes, or deodorant  Do not shave 48 hours prior to surgery.    Do not bring valuables to the hospital.  [facility] is not responsible for any belongings or valuables.  If you are a smoker, DO NOT Smoke 24 hours prior to surgery  If you wear a CPAP at night please bring your mask the morning of surgery   Remember that you must have someone to transport you home after your surgery, and remain with you for 24 hours if you are discharged the same day.   Please bring cases for contacts, glasses, hearing aids, dentures or bridgework because it cannot be worn into surgery.    Leave your suitcase in the car.  After surgery it may be brought to your room.  For patients admitted to the hospital, discharge time will be determined by your treatment team.  Patients discharged the day of surgery will not be allowed to drive home.    Special instructions:   [place]- Preparing For Surgery  Before surgery, you can play an important role. Because skin is not sterile, your skin needs to be  as free of germs as possible. You can reduce the number of germs on your skin by washing with CHG (chlorahexidine gluconate) Soap before surgery.  CHG is an antiseptic cleaner which kills germs and bonds with the skin to continue killing germs even after washing.    Oral Hygiene is also important to reduce your risk of infection.  Remember - BRUSH YOUR TEETH THE MORNING OF SURGERY WITH YOUR REGULAR TOOTHPASTE  Please do not use if you have an allergy to CHG or antibacterial soaps. If your skin becomes reddened/irritated stop using the CHG.  Do not shave (including legs and underarms) for at least 48 hours prior to first CHG shower. It is OK to shave your face.  Please follow these instructions carefully.   1. Shower the NIGHT BEFORE SURGERY and the MORNING OF SURGERY with CHG Soap.   2. If you chose to wash your hair and body, wash as usual with your normal shampoo and body-wash/soap.  3. Rinse your hair and body thoroughly to remove the shampoo and soap.  4. Apply CHG directly to the skin (ONLY FROM THE NECK DOWN) and wash gently with a scrungie or a clean washcloth.   5. Do not use on open wounds or open sores. Avoid contact with your eyes, ears, mouth and genitals (private parts). Wash Face and genitals (private parts)  with your normal soap.   6. Wash thoroughly, paying special attention to the area where your surgery  will be performed.  7. Thoroughly rinse your body with warm water from the neck down.  8. DO NOT shower/wash with your normal soap after using and rinsing off the CHG Soap.  9. Pat yourself dry with a CLEAN TOWEL.  10. Wear CLEAN PAJAMAS to bed the night before surgery  11. Place CLEAN SHEETS on your bed the night of your first shower and DO NOT SLEEP WITH PETS.  12. Wear comfortable clothes the morning of surgery.     Day of Surgery:  Please shower the morning of surgery with the CHG soap Do not apply any deodorants/lotions. Please wear clean clothes to the  hospital/surgery center.   Remember to brush your teeth WITH YOUR REGULAR TOOTHPASTE.   Please read over the following fact sheets that you were given.

## 2019-11-01 LAB — SARS CORONAVIRUS 2 (TAT 6-24 HRS): SARS Coronavirus 2: NEGATIVE

## 2019-11-01 NOTE — Progress Notes (Signed)
Anesthesia Chart Review:  Follows with cardiologist Dr. Nehemiah Massed for history of nonvalvular paroxysmal A. fib, hypertension, hyperlipidemia.  She has reported CHA2DS2-VASc score of 1 is not anticoagulated.  She was seen 10/16/2019 for preoperative assessment.  Per note, "-Proceed to surgery and/or invasive procedure without restriction to pre or post operative and/or procedural care. The patient is at lowest risk possible for cardiovascular complications with surgical intervention and/or invasive procedure. Currently has no evidence active and/or significant angina and/or congestive heart failure."   Preop labs reviewed, unremarkable.  EKG 05/06/2019: NSR.  Rate 86.  CHEST - 2 VIEW 05/06/2019: COMPARISON:  08/16/2018  FINDINGS: Normal heart size and mediastinal contours. No acute infiltrate or edema. No effusion or pneumothorax. No acute osseous findings. Thoracic scoliosis. Cholecystectomy clips.  IMPRESSION: No evidence of active disease.   TTE 10/16/2019 (Care Everywhere): INTERPRETATION NORMAL LEFT VENTRICULAR SYSTOLIC FUNCTION NORMAL RIGHT VENTRICULAR SYSTOLIC FUNCTION TRIVIAL REGURGITATION NOTED (See above) NO VALVULAR STENOSIS TRIVIAL TR MR EF 55%   Wynonia Musty [facility] Short Stay Center/Anesthesiology Phone [phone] 11/01/2019 9:45 AM

## 2019-11-03 LAB — HOMOCYSTEINE: Homocysteine: 8.7 umol/L (ref <10.4)

## 2019-11-03 LAB — TISSUE TRANSGLUTAMINASE, IGA: (tTG) Ab, IgA: 1 U/mL

## 2019-11-03 LAB — RETICULIN ANTIBODIES, IGA W TITER: Reticulin IgA Screen: NEGATIVE

## 2019-11-03 LAB — GLIADIN ANTIBODIES, SERUM
Gliadin IgA: 7 Units
Gliadin IgG: 2 Units

## 2019-11-03 LAB — METHYLMALONIC ACID, SERUM: Methylmalonic Acid, Quant: 122 nmol/L (ref 87–318)

## 2019-11-03 LAB — INTRINSIC FACTOR ANTIBODIES: Intrinsic Factor: NEGATIVE

## 2019-11-03 LAB — FOLATE RBC: RBC Folate: 471 ng/mL RBC (ref >280)

## 2019-11-03 NOTE — Anesthesia Preprocedure Evaluation (Addendum)
Anesthesia Evaluation  Patient identified by MRN, date of birth, ID band Patient awake    Reviewed: Allergy & Precautions, NPO status , Patient's Chart, lab work & pertinent test results, reviewed documented beta blocker date and time   History of Anesthesia Complications (+) PONV  Airway Mallampati: II  TM Distance: >3 FB Neck ROM: Full    Dental  (+) Dental Advisory Given, Teeth Intact   Pulmonary sleep apnea (uses dental appliance) ,  10/31/2019 SARS coronavirus NEG   breath sounds clear to auscultation       Cardiovascular hypertension, Pt. on medications and Pt. on home beta blockers + dysrhythmias Atrial Fibrillation  Rhythm:Regular Rate:Normal     Neuro/Psych negative neurological ROS     GI/Hepatic negative GI ROS, Neg liver ROS,   Endo/Other  diabetes (diet controlled)Hypothyroidism Morbid obesity  Renal/GU negative Renal ROS     Musculoskeletal   Abdominal (+) + obese,   Peds  Hematology negative hematology ROS (+)   Anesthesia Other Findings   Reproductive/Obstetrics                            Anesthesia Physical Anesthesia Plan  ASA: III  Anesthesia Plan: General   Post-op Pain Management:    Induction: Intravenous  PONV Risk Score and Plan: 4 or greater and Ondansetron, Dexamethasone and Scopolamine patch - Pre-op  Airway Management Planned: Oral ETT  Additional Equipment: None  Intra-op Plan:   Post-operative Plan: Extubation in OR  Informed Consent: I have reviewed the patients History and Physical, chart, labs and discussed the procedure including the risks, benefits and alternatives for the proposed anesthesia with the patient or authorized representative who has indicated his/her understanding and acceptance.     Dental advisory given  Plan Discussed with: CRNA and Surgeon  Anesthesia Plan Comments:        Anesthesia Quick Evaluation

## 2019-11-04 ENCOUNTER — Ambulatory Visit (HOSPITAL_COMMUNITY): Payer: BC Managed Care – PPO

## 2019-11-04 ENCOUNTER — Encounter (HOSPITAL_COMMUNITY): Payer: Self-pay | Admitting: Neurosurgery

## 2019-11-04 ENCOUNTER — Ambulatory Visit (HOSPITAL_COMMUNITY)
Admission: RE | Admit: 2019-11-04 | Discharge: 2019-11-05 | Disposition: A | Discharge status: Home / Self Care | Payer: BC Managed Care – PPO | Attending: Neurosurgery | Admitting: Neurosurgery

## 2019-11-04 ENCOUNTER — Encounter (HOSPITAL_COMMUNITY)
Admission: RE | Disposition: A | Discharge status: Home / Self Care | Payer: Self-pay | Source: Home / Self Care | Attending: Neurosurgery

## 2019-11-04 ENCOUNTER — Ambulatory Visit (HOSPITAL_COMMUNITY): Payer: BC Managed Care – PPO | Admitting: Physician Assistant

## 2019-11-04 ENCOUNTER — Ambulatory Visit (HOSPITAL_COMMUNITY): Payer: BC Managed Care – PPO | Admitting: Anesthesiology

## 2019-11-04 ENCOUNTER — Other Ambulatory Visit: Payer: Self-pay

## 2019-11-04 DIAGNOSIS — E039 Hypothyroidism, unspecified: Secondary | ICD-10-CM | POA: Diagnosis not present

## 2019-11-04 DIAGNOSIS — M5117 Intervertebral disc disorders with radiculopathy, lumbosacral region: Secondary | ICD-10-CM | POA: Diagnosis not present

## 2019-11-04 DIAGNOSIS — G4730 Sleep apnea, unspecified: Secondary | ICD-10-CM | POA: Diagnosis not present

## 2019-11-04 DIAGNOSIS — Z981 Arthrodesis status: Secondary | ICD-10-CM | POA: Diagnosis not present

## 2019-11-04 DIAGNOSIS — E119 Type 2 diabetes mellitus without complications: Secondary | ICD-10-CM | POA: Diagnosis not present

## 2019-11-04 DIAGNOSIS — M5127 Other intervertebral disc displacement, lumbosacral region: Secondary | ICD-10-CM | POA: Insufficient documentation

## 2019-11-04 DIAGNOSIS — Z79890 Hormone replacement therapy (postmenopausal): Secondary | ICD-10-CM | POA: Diagnosis not present

## 2019-11-04 DIAGNOSIS — I10 Essential (primary) hypertension: Secondary | ICD-10-CM | POA: Diagnosis not present

## 2019-11-04 DIAGNOSIS — Z79899 Other long term (current) drug therapy: Secondary | ICD-10-CM | POA: Insufficient documentation

## 2019-11-04 DIAGNOSIS — G4733 Obstructive sleep apnea (adult) (pediatric): Secondary | ICD-10-CM | POA: Diagnosis not present

## 2019-11-04 DIAGNOSIS — Z419 Encounter for procedure for purposes other than remedying health state, unspecified: Secondary | ICD-10-CM

## 2019-11-04 DIAGNOSIS — Z6835 Body mass index (BMI) 35.0-35.9, adult: Secondary | ICD-10-CM | POA: Insufficient documentation

## 2019-11-04 DIAGNOSIS — M5126 Other intervertebral disc displacement, lumbar region: Secondary | ICD-10-CM | POA: Diagnosis present

## 2019-11-04 HISTORY — PX: LUMBAR LAMINECTOMY/DECOMPRESSION MICRODISCECTOMY: SHX5026

## 2019-11-04 LAB — POCT PREGNANCY, URINE: Preg Test, Ur: NEGATIVE

## 2019-11-04 LAB — GLUCOSE, CAPILLARY
Glucose-Capillary: 104 mg/dL — ABNORMAL HIGH (ref 70–99)
Glucose-Capillary: 137 mg/dL — ABNORMAL HIGH (ref 70–99)
Glucose-Capillary: 199 mg/dL — ABNORMAL HIGH (ref 70–99)

## 2019-11-04 SURGERY — LUMBAR LAMINECTOMY/DECOMPRESSION MICRODISCECTOMY 1 LEVEL
Anesthesia: General

## 2019-11-04 MED ORDER — BACITRACIN ZINC 500 UNIT/GM EX OINT
TOPICAL_OINTMENT | CUTANEOUS | Status: DC | PRN
  Administered 2019-11-04: 1 via TOPICAL

## 2019-11-04 MED ORDER — PROMETHAZINE HCL 25 MG/ML IJ SOLN: 6.2500 mg | INTRAMUSCULAR | Status: DC | PRN

## 2019-11-04 MED ORDER — VALACYCLOVIR HCL 500 MG PO TABS: 1000.0000 mg | ORAL_TABLET | Freq: Every day | ORAL | Status: DC

## 2019-11-04 MED ORDER — THROMBIN 5000 UNITS EX SOLR
OROMUCOSAL | Status: DC | PRN
  Administered 2019-11-04: 5 mL via TOPICAL

## 2019-11-04 MED ORDER — SODIUM CHLORIDE 0.9 % IV SOLN
INTRAVENOUS | Status: DC | PRN
  Administered 2019-11-04: 500 mL

## 2019-11-04 MED ORDER — SCOPOLAMINE 1 MG/3DAYS TD PT72: 1.0000 | MEDICATED_PATCH | TRANSDERMAL | Status: DC

## 2019-11-04 MED ORDER — THYROID 30 MG PO TABS
15.0000 mg | ORAL_TABLET | Freq: Every day | ORAL | Status: DC
  Filled 2019-11-04 (×3): qty 1

## 2019-11-04 MED ORDER — MORPHINE SULFATE (PF) 4 MG/ML IV SOLN: 4.0000 mg | INTRAVENOUS | Status: DC | PRN

## 2019-11-04 MED ORDER — MIDAZOLAM HCL 2 MG/2ML IJ SOLN
INTRAMUSCULAR | Status: AC
  Filled 2019-11-04: qty 2

## 2019-11-04 MED ORDER — FENTANYL CITRATE (PF) 100 MCG/2ML IJ SOLN
INTRAMUSCULAR | Status: DC | PRN
Start: 2019-11-04 — End: 2019-11-04
  Administered 2019-11-04: 150 ug via INTRAVENOUS
  Administered 2019-11-04: 50 ug via INTRAVENOUS

## 2019-11-04 MED ORDER — ACETAMINOPHEN 325 MG PO TABS: 650.0000 mg | ORAL_TABLET | ORAL | Status: DC | PRN

## 2019-11-04 MED ORDER — CHLORHEXIDINE GLUCONATE CLOTH 2 % EX PADS: 6.0000 | MEDICATED_PAD | Freq: Once | CUTANEOUS | Status: DC

## 2019-11-04 MED ORDER — ONDANSETRON HCL 4 MG PO TABS: 4.0000 mg | ORAL_TABLET | Freq: Four times a day (QID) | ORAL | Status: DC | PRN

## 2019-11-04 MED ORDER — SCOPOLAMINE 1 MG/3DAYS TD PT72
MEDICATED_PATCH | TRANSDERMAL | Status: AC
  Administered 2019-11-04: 1.5 mg via TRANSDERMAL
  Filled 2019-11-04: qty 1

## 2019-11-04 MED ORDER — PROPOFOL 10 MG/ML IV BOLUS
INTRAVENOUS | Status: DC | PRN
  Administered 2019-11-04: 150 mg via INTRAVENOUS

## 2019-11-04 MED ORDER — MEPERIDINE HCL 25 MG/ML IJ SOLN: 6.2500 mg | INTRAMUSCULAR | Status: DC | PRN

## 2019-11-04 MED ORDER — SODIUM CHLORIDE 0.9 % IV SOLN
250.0000 mL | INTRAVENOUS | Status: DC
  Administered 2019-11-04: 250 mL via INTRAVENOUS

## 2019-11-04 MED ORDER — ACETAMINOPHEN 500 MG PO TABS
ORAL_TABLET | ORAL | Status: AC
  Administered 2019-11-04: 1000 mg via ORAL
  Filled 2019-11-04: qty 2

## 2019-11-04 MED ORDER — ZOLPIDEM TARTRATE 5 MG PO TABS: 5.0000 mg | ORAL_TABLET | Freq: Every evening | ORAL | Status: DC | PRN

## 2019-11-04 MED ORDER — PROPOFOL 10 MG/ML IV BOLUS
INTRAVENOUS | Status: AC
  Filled 2019-11-04: qty 20

## 2019-11-04 MED ORDER — FENTANYL CITRATE (PF) 250 MCG/5ML IJ SOLN
INTRAMUSCULAR | Status: AC
  Filled 2019-11-04: qty 5

## 2019-11-04 MED ORDER — ESMOLOL HCL 100 MG/10ML IV SOLN
INTRAVENOUS | Status: DC | PRN
  Administered 2019-11-04: 30 mg via INTRAVENOUS

## 2019-11-04 MED ORDER — DEXAMETHASONE SODIUM PHOSPHATE 10 MG/ML IJ SOLN
INTRAMUSCULAR | Status: DC | PRN
  Administered 2019-11-04: 10 mg via INTRAVENOUS

## 2019-11-04 MED ORDER — OXYCODONE HCL 5 MG PO TABS: 10.0000 mg | ORAL_TABLET | ORAL | Status: DC | PRN

## 2019-11-04 MED ORDER — BUPIVACAINE-EPINEPHRINE (PF) 0.5% -1:200000 IJ SOLN
INTRAMUSCULAR | Status: DC | PRN
  Administered 2019-11-04 (×2): 10 mL via PERINEURAL

## 2019-11-04 MED ORDER — CEFAZOLIN SODIUM-DEXTROSE 2-4 GM/100ML-% IV SOLN
INTRAVENOUS | Status: AC
  Filled 2019-11-04: qty 100

## 2019-11-04 MED ORDER — PHENYLEPHRINE HCL-NACL 10-0.9 MG/250ML-% IV SOLN
INTRAVENOUS | Status: DC | PRN
  Administered 2019-11-04: 20 ug/min via INTRAVENOUS

## 2019-11-04 MED ORDER — EPHEDRINE SULFATE-NACL 50-0.9 MG/10ML-% IV SOSY
PREFILLED_SYRINGE | INTRAVENOUS | Status: DC | PRN
  Administered 2019-11-04 (×2): 10 mg via INTRAVENOUS

## 2019-11-04 MED ORDER — HYDROMORPHONE HCL 1 MG/ML IJ SOLN
0.2500 mg | INTRAMUSCULAR | Status: DC | PRN
  Administered 2019-11-04: 0.25 mg via INTRAVENOUS
  Administered 2019-11-04: 0.5 mg via INTRAVENOUS

## 2019-11-04 MED ORDER — DIPHENHYDRAMINE HCL 50 MG/ML IJ SOLN
INTRAMUSCULAR | Status: DC | PRN
  Administered 2019-11-04: 12.5 mg via INTRAVENOUS

## 2019-11-04 MED ORDER — LACTATED RINGERS IV SOLN: INTRAVENOUS | Status: DC

## 2019-11-04 MED ORDER — BISACODYL 10 MG RE SUPP: 10.0000 mg | Freq: Every day | RECTAL | Status: DC | PRN

## 2019-11-04 MED ORDER — METOPROLOL SUCCINATE ER 25 MG PO TB24: 25.0000 mg | ORAL_TABLET | Freq: Every day | ORAL | Status: DC

## 2019-11-04 MED ORDER — MENTHOL 3 MG MT LOZG
1.0000 | LOZENGE | OROMUCOSAL | Status: DC | PRN
  Filled 2019-11-04: qty 9

## 2019-11-04 MED ORDER — SODIUM CHLORIDE 0.9% FLUSH
3.0000 mL | Freq: Two times a day (BID) | INTRAVENOUS | Status: DC
  Administered 2019-11-04 (×2): 3 mL via INTRAVENOUS

## 2019-11-04 MED ORDER — CHLORHEXIDINE GLUCONATE 0.12 % MT SOLN: 15.0000 mL | Freq: Once | OROMUCOSAL | Status: AC

## 2019-11-04 MED ORDER — ORAL CARE MOUTH RINSE: 15.0000 mL | Freq: Once | OROMUCOSAL | Status: AC

## 2019-11-04 MED ORDER — HYDROMORPHONE HCL 1 MG/ML IJ SOLN
INTRAMUSCULAR | Status: AC
Start: 2019-11-04 — End: 2019-11-04
  Filled 2019-11-04: qty 1

## 2019-11-04 MED ORDER — ACETAMINOPHEN 650 MG RE SUPP: 650.0000 mg | RECTAL | Status: DC | PRN

## 2019-11-04 MED ORDER — LIDOCAINE 2% (20 MG/ML) 5 ML SYRINGE
INTRAMUSCULAR | Status: DC | PRN
  Administered 2019-11-04: 20 mg via INTRAVENOUS

## 2019-11-04 MED ORDER — SUGAMMADEX SODIUM 200 MG/2ML IV SOLN
INTRAVENOUS | Status: DC | PRN
  Administered 2019-11-04: 300 mg via INTRAVENOUS

## 2019-11-04 MED ORDER — ROCURONIUM BROMIDE 10 MG/ML (PF) SYRINGE
PREFILLED_SYRINGE | INTRAVENOUS | Status: DC | PRN
  Administered 2019-11-04: 60 mg via INTRAVENOUS
  Administered 2019-11-04: 20 mg via INTRAVENOUS

## 2019-11-04 MED ORDER — METOPROLOL TARTRATE 5 MG/5ML IV SOLN
5.0000 mg | Freq: Once | INTRAVENOUS | Status: AC
  Administered 2019-11-04: 5 mg via INTRAVENOUS

## 2019-11-04 MED ORDER — OXYCODONE HCL 5 MG/5ML PO SOLN: 5.0000 mg | Freq: Once | ORAL | Status: DC | PRN

## 2019-11-04 MED ORDER — DOCUSATE SODIUM 100 MG PO CAPS
100.0000 mg | ORAL_CAPSULE | Freq: Two times a day (BID) | ORAL | Status: DC
  Administered 2019-11-04 (×2): 100 mg via ORAL
  Filled 2019-11-04 (×2): qty 1

## 2019-11-04 MED ORDER — OXYCODONE HCL 5 MG PO TABS: 5.0000 mg | ORAL_TABLET | ORAL | Status: DC | PRN

## 2019-11-04 MED ORDER — THROMBIN 5000 UNITS EX SOLR
CUTANEOUS | Status: AC
  Filled 2019-11-04: qty 15000

## 2019-11-04 MED ORDER — ONDANSETRON HCL 4 MG/2ML IJ SOLN
INTRAMUSCULAR | Status: DC | PRN
  Administered 2019-11-04: 4 mg via INTRAVENOUS

## 2019-11-04 MED ORDER — BUPIVACAINE-EPINEPHRINE 0.5% -1:200000 IJ SOLN
INTRAMUSCULAR | Status: AC
  Filled 2019-11-04: qty 1

## 2019-11-04 MED ORDER — SODIUM CHLORIDE 0.9% FLUSH: 3.0000 mL | INTRAVENOUS | Status: DC | PRN

## 2019-11-04 MED ORDER — GLYCOPYRROLATE 0.2 MG/ML IJ SOLN
INTRAMUSCULAR | Status: DC | PRN
  Administered 2019-11-04: .1 mg via INTRAVENOUS

## 2019-11-04 MED ORDER — CHLORHEXIDINE GLUCONATE 0.12 % MT SOLN
OROMUCOSAL | Status: AC
  Administered 2019-11-04: 15 mL via OROMUCOSAL
  Filled 2019-11-04: qty 15

## 2019-11-04 MED ORDER — OXYCODONE HCL 5 MG PO TABS: 5.0000 mg | ORAL_TABLET | Freq: Once | ORAL | Status: DC | PRN

## 2019-11-04 MED ORDER — CEFAZOLIN SODIUM-DEXTROSE 2-4 GM/100ML-% IV SOLN
2.0000 g | Freq: Three times a day (TID) | INTRAVENOUS | Status: AC
  Administered 2019-11-04 – 2019-11-05 (×2): 2 g via INTRAVENOUS
  Filled 2019-11-04 (×2): qty 100

## 2019-11-04 MED ORDER — MIDAZOLAM HCL 2 MG/2ML IJ SOLN: 0.5000 mg | Freq: Once | INTRAMUSCULAR | Status: DC | PRN

## 2019-11-04 MED ORDER — PHENOL 1.4 % MT LIQD
1.0000 | OROMUCOSAL | Status: DC | PRN
  Administered 2019-11-05: 1 via OROMUCOSAL
  Filled 2019-11-04: qty 177

## 2019-11-04 MED ORDER — CEFAZOLIN SODIUM-DEXTROSE 2-4 GM/100ML-% IV SOLN
2.0000 g | INTRAVENOUS | Status: AC
  Administered 2019-11-04: 2 g via INTRAVENOUS

## 2019-11-04 MED ORDER — ACETAMINOPHEN 500 MG PO TABS: 1000.0000 mg | ORAL_TABLET | Freq: Once | ORAL | Status: AC

## 2019-11-04 MED ORDER — ACETAMINOPHEN 500 MG PO TABS
1000.0000 mg | ORAL_TABLET | Freq: Four times a day (QID) | ORAL | Status: AC
  Administered 2019-11-04 – 2019-11-05 (×3): 1000 mg via ORAL
  Filled 2019-11-04 (×3): qty 2

## 2019-11-04 MED ORDER — CYCLOBENZAPRINE HCL 10 MG PO TABS: 10.0000 mg | ORAL_TABLET | Freq: Three times a day (TID) | ORAL | Status: DC | PRN

## 2019-11-04 MED ORDER — 0.9 % SODIUM CHLORIDE (POUR BTL) OPTIME
TOPICAL | Status: DC | PRN
  Administered 2019-11-04: 1000 mL

## 2019-11-04 MED ORDER — METOPROLOL TARTRATE 5 MG/5ML IV SOLN
INTRAVENOUS | Status: AC
  Filled 2019-11-04: qty 5

## 2019-11-04 MED ORDER — BACITRACIN ZINC 500 UNIT/GM EX OINT
TOPICAL_OINTMENT | CUTANEOUS | Status: AC
  Filled 2019-11-04: qty 28.35

## 2019-11-04 MED ORDER — ONDANSETRON HCL 4 MG/2ML IJ SOLN: 4.0000 mg | Freq: Four times a day (QID) | INTRAMUSCULAR | Status: DC | PRN

## 2019-11-04 SURGICAL SUPPLY — 48 items
BAG DECANTER FOR FLEXI CONT (MISCELLANEOUS) ×2 IMPLANT
BAND RUBBER #18 3X1/16 STRL (MISCELLANEOUS) ×4 IMPLANT
BENZOIN TINCTURE PRP APPL 2/3 (GAUZE/BANDAGES/DRESSINGS) ×2 IMPLANT
BLADE CLIPPER SURG (BLADE) IMPLANT
BUR MATCHSTICK NEURO 3.0 LAGG (BURR) ×2 IMPLANT
BUR PRECISION FLUTE 6.0 (BURR) ×2 IMPLANT
CANISTER SUCT 3000ML PPV (MISCELLANEOUS) ×2 IMPLANT
CARTRIDGE OIL MAESTRO DRILL (MISCELLANEOUS) ×1 IMPLANT
COVER WAND RF STERILE (DRAPES) ×2 IMPLANT
DERMABOND ADVANCED (GAUZE/BANDAGES/DRESSINGS) ×1
DERMABOND ADVANCED .7 DNX12 (GAUZE/BANDAGES/DRESSINGS) ×1 IMPLANT
DIFFUSER DRILL AIR PNEUMATIC (MISCELLANEOUS) ×2 IMPLANT
DRAPE LAPAROTOMY 100X72X124 (DRAPES) ×2 IMPLANT
DRAPE MICROSCOPE LEICA (MISCELLANEOUS) ×2 IMPLANT
DRAPE SURG 17X23 STRL (DRAPES) ×8 IMPLANT
DRSG OPSITE POSTOP 4X6 (GAUZE/BANDAGES/DRESSINGS) ×2 IMPLANT
ELECT BLADE 4.0 EZ CLEAN MEGAD (MISCELLANEOUS) ×2
ELECT REM PT RETURN 9FT ADLT (ELECTROSURGICAL) ×2
ELECTRODE BLDE 4.0 EZ CLN MEGD (MISCELLANEOUS) ×1 IMPLANT
ELECTRODE REM PT RTRN 9FT ADLT (ELECTROSURGICAL) ×1 IMPLANT
GAUZE 4X4 16PLY RFD (DISPOSABLE) IMPLANT
GAUZE SPONGE 4X4 12PLY STRL (GAUZE/BANDAGES/DRESSINGS) ×2 IMPLANT
GLOVE BIO SURGEON STRL SZ8 (GLOVE) ×4 IMPLANT
GLOVE BIO SURGEON STRL SZ8.5 (GLOVE) ×2 IMPLANT
GLOVE EXAM NITRILE XL STR (GLOVE) IMPLANT
GOWN STRL REUS W/ TWL LRG LVL3 (GOWN DISPOSABLE) ×2 IMPLANT
GOWN STRL REUS W/ TWL XL LVL3 (GOWN DISPOSABLE) ×1 IMPLANT
GOWN STRL REUS W/TWL 2XL LVL3 (GOWN DISPOSABLE) IMPLANT
GOWN STRL REUS W/TWL LRG LVL3 (GOWN DISPOSABLE) ×2
GOWN STRL REUS W/TWL XL LVL3 (GOWN DISPOSABLE) ×1
HEMOSTAT POWDER SURGIFOAM 1G (HEMOSTASIS) ×2 IMPLANT
KIT BASIN OR (CUSTOM PROCEDURE TRAY) ×2 IMPLANT
KIT TURNOVER KIT B (KITS) ×2 IMPLANT
NEEDLE HYPO 21X1.5 SAFETY (NEEDLE) IMPLANT
NEEDLE HYPO 22GX1.5 SAFETY (NEEDLE) ×2 IMPLANT
NS IRRIG 1000ML POUR BTL (IV SOLUTION) ×2 IMPLANT
OIL CARTRIDGE MAESTRO DRILL (MISCELLANEOUS) ×2
PACK LAMINECTOMY NEURO (CUSTOM PROCEDURE TRAY) ×2 IMPLANT
PAD ARMBOARD 7.5X6 YLW CONV (MISCELLANEOUS) IMPLANT
PATTIES SURGICAL .5 X1 (DISPOSABLE) IMPLANT
SPONGE SURGIFOAM ABS GEL SZ50 (HEMOSTASIS) IMPLANT
STRIP CLOSURE SKIN 1/2X4 (GAUZE/BANDAGES/DRESSINGS) ×2 IMPLANT
SUT VIC AB 1 CT1 18XBRD ANBCTR (SUTURE) ×1 IMPLANT
SUT VIC AB 1 CT1 8-18 (SUTURE) ×1
SUT VIC AB 2-0 CP2 18 (SUTURE) ×2 IMPLANT
TOWEL GREEN STERILE (TOWEL DISPOSABLE) ×2 IMPLANT
TOWEL GREEN STERILE FF (TOWEL DISPOSABLE) ×2 IMPLANT
WATER STERILE IRR 1000ML POUR (IV SOLUTION) ×2 IMPLANT

## 2019-11-04 NOTE — Progress Notes (Signed)
Subjective: I saw the patient in the PACU.  She was somnolent but pleasant.  She was in no apparent distress.  Objective: Vital signs in last 24 hours: Temp:  [98 F (36.7 C)-98.1 F (36.7 C)] 98 F (36.7 C) (09/13 1050) Pulse Rate:  [65-139] 139 (09/13 1150) Resp:  [12-20] 20 (09/13 1050) BP: (119-151)/(68-93) 127/79 (09/13 1050) SpO2:  [93 %-100 %] 97 % (09/13 1150) Weight:  [98.5 kg] 98.5 kg (09/13 0602) Estimated body mass index is 35.59 kg/m as calculated from the following:   Height as of this encounter: 5' 5.5" (1.664 m).   Weight as of this encounter: 98.5 kg.   Intake/Output from previous day: No intake/output data recorded. Intake/Output this shift: Total I/O In: 100 [IV Piggyback:100] Out: 100 [Blood:100]  Physical exam the patient was somnolent but arousable.  She is moving her lower extremities well.  Lab Results: No results for input(s): WBC, HGB, HCT, PLT in the last 72 hours. BMET No results for input(s): NA, K, CL, CO2, GLUCOSE, BUN, CREATININE, CALCIUM in the last 72 hours.  Studies/Results: DG Lumbar Spine 2-3 Views  Result Date: 11/04/2019 CLINICAL DATA:  Intraoperative localization EXAM: LUMBAR SPINE - 2-3 VIEW COMPARISON:  MR lumbar spine, 08/18/2019 FINDINGS: Intraoperative lateral views of the lumbar spine demonstrate instrument localization over the L5-S1 disc space. IMPRESSION: Intraoperative lateral views of the lumbar spine demonstrate instrument localization over the L5-S1 disc space. Electronically Signed   By: Eddie Candle M.D.   On: 11/04/2019 09:29    Assessment/Plan: The patient is doing well neurologically.  I spoke with her husband.  We will observe her in a telemetry room because of her on and off A. fib which has been a chronic issue for her.  LOS: 0 days     Ophelia Charter 11/04/2019, 1:01 PM

## 2019-11-04 NOTE — Transfer of Care (Signed)
Immediate Anesthesia Transfer of Care Note  Patient: Joy Houston  Procedure(s) Performed: MICRODISCECTOMY LEFT LUMBAR FIVE SACRAL ONE (N/A )  Patient Location: PACU  Anesthesia Type:General  Level of Consciousness: awake, alert  and oriented  Airway & Oxygen Therapy: Patient connected to face mask oxygen  Post-op Assessment: Post -op Vital signs reviewed and stable  Post vital signs: stable  Last Vitals:  Vitals Value Taken Time  BP 131/68 11/04/19 0921  Temp    Pulse 137 11/04/19 0923  Resp 25 11/04/19 0923  SpO2 100 % 11/04/19 0923  Vitals shown include unvalidated device data.  Last Pain:  Vitals:   11/04/19 0641  TempSrc:   PainSc: 0-No pain         Complications: No complications documented.

## 2019-11-04 NOTE — H&P (Signed)
Subjective: The patient is a 54 year old white female was at previous lumbar herniated disc and surgery.  She has developed recurrent back and left leg pain consistent with a left L5 and/or S1 radiculopathy.  She failed medical management.  She was worked up with NCV EMGs which were consistent with a left L5 radiculopathy as well as an MRI scan which demonstrated hernia disc L5-S1 the left.  We discussed the inconsistencies.  We discussed the various treatment options including surgery.  She has weighed the risks, benefits and alternatives surgery and decided to proceed with a left L5-S1 discectomy.  Past Medical History:  Diagnosis Date  . Anemia   . Angio-edema   . Atrial fibrillation ([place])   . Complication of anesthesia   . Diabetes mellitus without complication (HCC)    diet controlled  . Eczema   . [place] Patient infection   . Family history of anesthesia complication    Mother - confusion  . Fatty liver   . Heart murmur    Slight - "nothing to worry about"  . Hemorrhoid   . Hypertension   . Hypothyroidism   . PONV (postoperative nausea and vomiting)    "only with c-sections"  . Sleep apnea    mild    Past Surgical History:  Procedure Laterality Date  . CESAREAN SECTION    . CHOLECYSTECTOMY N/A 09/07/2018   Procedure: LAPAROSCOPIC CHOLECYSTECTOMY;  Surgeon: Herbert Pun, MD;  Location: [facility] ORS;  Service: General;  Laterality: N/A;  . FINGER SURGERY     pin to 3rd finger Right hand  . LUMBAR LAMINECTOMY/DECOMPRESSION MICRODISCECTOMY  12/15/2011   Procedure: LUMBAR LAMINECTOMY/DECOMPRESSION MICRODISCECTOMY 1 LEVEL;  Surgeon: Ophelia Charter, MD;  Location: [facility] ORS;  Service: Neurosurgery;  Laterality: Right;  RIGHT Lumbar four-Five diskectomy  . MICRODISCECTOMY LUMBAR     L4-5  . WISDOM TOOTH EXTRACTION      Allergies  Allergen Reactions  . Angiotensin Receptor Blockers Swelling    Angioedema Had past reaction of arb and angioedema with ace  . Lisinopril  Swelling  . Morphine And Related Itching    Slight itching around nose area    Social History   Tobacco Use  . Smoking status: Never Smoker  . Smokeless tobacco: Never Used  Substance Use Topics  . Alcohol use: Not Currently    Alcohol/week: 0.0 standard drinks    Family History  Problem Relation Age of Onset  . GER disease Mother   . Diabetes Father   . Cancer Father        Melanoma, Prostate  . Heart disease Father   . Heart disease Maternal Grandfather   . Diabetes Paternal Grandfather   . Breast cancer Sister 61   Prior to Admission medications   Medication Sig Start Date End Date Taking? Authorizing Provider  cyanocobalamin (,VITAMIN B-12,) 1000 MCG/ML injection INJECT 1 ML INTO THE MUSCLE ONCE WEEKLY X 4 WEEKS AND THEN ONCE MONTHLY. 10/31/19  Yes Einar Pheasant, MD  Digestive Enzymes (SIMILASE PO) Take 1 tablet by mouth 3 (three) times daily with meals.   Yes [provider]  EPINEPHrine 0.3 mg/0.3 mL IJ SOAJ injection Inject 0.3 mg into the muscle as needed for anaphylaxis.  05/08/18  Yes [provider]  GLUTATHIONE PO Take 7 Pump by mouth daily.  05/09/19  Yes [provider]  Methylsulfonylmethane (MSM) 1000 MG TABS Take 1,000 mg by mouth daily.   Yes [provider]  metoprolol succinate (TOPROL-XL) 25 MG 24 hr tablet  Take 25 mg by mouth daily.   Yes [provider]  Nutritional Supplements (DHEA PO) Take 1 capsule by mouth daily.   Yes [provider]  thyroid (NP THYROID) 15 MG tablet Take 15 mg by mouth daily.   Yes [provider]  valACYclovir (VALTREX) 1000 MG tablet Take 1,000 mg by mouth daily.   Yes [provider]  Vitamin D-Vitamin K (VITAMIN K2-VITAMIN D3 PO) Take 1 tablet by mouth daily.   Yes [provider]  Syringe/Needle, Disp, (SYRINGE 3CC/25GX1") 25G X 1" 3 ML MISC Use as directed with b12 injections. 04/24/19   Einar Pheasant, MD     Review of Systems  Positive ROS: As  above  All other systems have been reviewed and were otherwise negative with the exception of those mentioned in the HPI and as above.  Objective: Vital signs in last 24 hours: Temp:  [98.1 F (36.7 C)] 98.1 F (36.7 C) (09/13 0602) Pulse Rate:  [65] 65 (09/13 0602) Resp:  [18] 18 (09/13 0602) BP: (138)/(81) 138/81 (09/13 0602) SpO2:  [98 %] 98 % (09/13 0602) Weight:  [98.5 kg] 98.5 kg (09/13 0602) Estimated body mass index is 35.59 kg/m as calculated from the following:   Height as of this encounter: 5' 5.5" (1.664 m).   Weight as of this encounter: 98.5 kg.   General Appearance: Alert Head: Normocephalic, without obvious abnormality, atraumatic Eyes: PERRL, conjunctiva/corneas clear, EOM's intact,    Ears: Normal  Throat: Normal  Neck: Her lumbar incision is well-healed. Back: unremarkable Lungs: Clear to auscultation bilaterally, respirations unlabored Heart: Regular rate and rhythm, no murmur, rub or gallop Abdomen: Soft, non-tender Extremities: Extremities normal, atraumatic, no cyanosis or edema Skin: unremarkable  NEUROLOGIC:   Mental status: alert and oriented,Motor Exam -the patient has weakness in her left EHL. Sensory Exam - grossly normal Reflexes:  Coordination - grossly normal Gait - grossly normal Balance - grossly normal Cranial Nerves: I: smell Not tested  II: visual acuity  OS: Normal  OD: Normal   II: visual fields Full to confrontation  II: pupils Equal, round, reactive to light  III,VII: ptosis None  III,IV,VI: extraocular muscles  Full ROM  V: mastication Normal  V: facial light touch sensation  Normal  V,VII: corneal reflex  Present  VII: facial muscle function - upper  Normal  VII: facial muscle function - lower Normal  VIII: hearing Not tested  IX: soft palate elevation  Normal  IX,X: gag reflex Present  XI: trapezius strength  5/5  XI: sternocleidomastoid strength 5/5  XI: neck flexion strength  5/5  XII: tongue strength  Normal     Data Review Lab Results  Component Value Date   WBC 7.5 10/30/2019   HGB 15.3 (H) 10/30/2019   HCT 45.6 10/30/2019   MCV 94.2 10/30/2019   PLT 203.0 10/30/2019   Lab Results  Component Value Date   NA 139 10/30/2019   K 4.1 10/30/2019   CL 104 10/30/2019   CO2 27 10/30/2019   BUN 12 10/30/2019   CREATININE 0.67 10/30/2019   GLUCOSE 88 10/30/2019   No results found for: INR, PROTIME  Assessment/Plan: Left L5-S1 herniated disc, lumbosacral radiculopathy, lumbago: I have discussed the situation with the patient and her husband.  I reviewed her diagnostic studies with them.  We have discussed the various treatment options including surgery.  I have described the surgical treatment option of left left L5-S1 discectomy.  I have shown her surgical models.  I  have given her a surgical pamphlet.  We have discussed the risk, benefits, alternatives, expected postop course, and likelihood of achieving our goals with surgery.  I have answered all the questions.  She has decided proceed with surgery.   Ophelia Charter 11/04/2019 7:27 AM

## 2019-11-04 NOTE — Anesthesia Procedure Notes (Signed)

## 2019-11-04 NOTE — Progress Notes (Signed)
Pt has been admitted to the unit via wheelchair. Tele has been attached. All IV are intact. All belongings have sent with the patient. Pt denies pain. Telephone and Call light are within reach.    11/04/19 1500  Vitals  Temp 99 F (37.2 C)  Temp Source Oral  BP 111/65  MAP (mmHg) 81  BP Location Right Arm  BP Method Automatic  Patient Position (if appropriate) Sitting  Pulse Rate 65  Pulse Rate Source Dinamap  Resp 20  Level of Consciousness  Level of Consciousness Alert  MEWS COLOR  MEWS Score Color Green  Oxygen Therapy  SpO2 97 %  O2 Device Room Air  Pain Assessment  Pain Scale 0-10  Pain Score 0  MEWS Score  MEWS Temp 0  MEWS Systolic 0  MEWS Pulse 0  MEWS RR 0  MEWS LOC 0  MEWS Score 0

## 2019-11-04 NOTE — Op Note (Signed)
Brief history: The patient is a 54 year old white female who has had previous lumbar ruptured disc.  She has developed acute back and left leg pain with a foot drop.  She failed medical management.  She was worked up with a lumbar MRI which demonstrated a herniated disc at L5-S1.  She had NCV EMGs which were consistent with left L5 radiculopathy.  I discussed the various treatment options with her.  She has weighed the risks, benefits and alternatives and decided proceed with a left L5-S1 discectomy.  Preoperative diagnosis: Left L5-S1 herniated disc, lumbar radiculopathy, lumbago  Postoperative diagnosis: The same  Procedure: Left L5-S1 intervertebral discectomy using micro-dissection  Surgeon: Dr. Earle Gell  Asst.: Arnetha Massy, NP  Anesthesia: Gen. endotracheal  Estimated blood loss: Minimal  Drains: None  Complications: None  Description of procedure: The patient was brought to the operating room by the anesthesia team. General endotracheal anesthesia was induced. The patient was turned to the prone position on the Wilson frame. The patient's lumbosacral region was then prepared with Betadine scrub and Betadine solution. Sterile drapes were applied.  I then injected the area to be incised with Marcaine with epinephrine solution. I then used a scalpel to make a linear midline incision over the L5-S1 intervertebral disc space. I then used electrocautery to perform a left sided subperiosteal dissection exposing the spinous process and lamina of L5 and S1. We obtained intraoperative radiograph to confirm our location. I then inserted the [facility] retractor for exposure.  We then brought the operative microscope into the field. Under its magnification and illumination we completed the microdissection. I used a high-speed drill to perform a laminotomy at L5-S1 on the left. I then used a Kerrison punches to widen the laminotomy and removed the ligamentum flavum at L5-S1 on the left. We  then used microdissection to free up the thecal sac and the left L5 and S1 nerve root from the epidural tissue. I then used a Kerrison punch to perform a foraminotomy at about the left L5 and S1 nerve root. We then using the nerve root retractor to gently retract the thecal sac and the S1 nerve root medially. This exposed the intervertebral disc. We identified the ruptured disc and remove it with the pituitary forceps.  We examined the annulus.  There was a small hole but no impending herniations.  We did not perform an intervertebral discectomy.  I then palpated along the ventral surface of the thecal sac and along exit route of the left L5 and S1 nerve root and noted that the neural structures were well decompressed. This completed the decompression.  We then obtained hemostasis using bipolar electrocautery. We irrigated the wound out with bacitracin solution. We then removed the retractor. We then reapproximated the patient's thoracolumbar fascia with interrupted #1 Vicryl suture. We then reapproximated the patient's subcutaneous tissue with interrupted 2-0 Vicryl suture. We then reapproximated patient's skin with Steri-Strips and benzoin. The was then coated with bacitracin ointment. The drapes were removed. The patient was subsequently returned to the supine position where they were extubated by the anesthesia team. The patient was then transported to the postanesthesia care unit in stable condition. All sponge instrument and needle counts were reportedly correct at the end of this case.

## 2019-11-04 NOTE — Anesthesia Postprocedure Evaluation (Signed)
Anesthesia Post Note  Patient: Joy Houston  Procedure(s) Performed: MICRODISCECTOMY LEFT LUMBAR FIVE SACRAL ONE (N/A )     Patient location during evaluation: PACU Anesthesia Type: General Level of consciousness: awake and alert, patient cooperative and oriented Pain management: pain level controlled Vital Signs Assessment: post-procedure vital signs reviewed and stable Respiratory status: spontaneous breathing, nonlabored ventilation and respiratory function stable Cardiovascular status: blood pressure returned to baseline and stable Postop Assessment: no apparent nausea or vomiting Anesthetic complications: no Comments: Pt with her usual Paroxysmal Afib, BP stable.  Have treated with additional metoprolol     No complications documented.  Last Vitals:  Vitals:   11/04/19 1035 11/04/19 1050  BP: 119/87 127/79  Pulse: (!) 134 92  Resp: 19 20  Temp:  36.7 C  SpO2: 93% 98%    Last Pain:  Vitals:   11/04/19 1011  TempSrc:   PainSc: 5                  Kaleigh Spiegelman,E. Saraiah Bhat

## 2019-11-05 ENCOUNTER — Encounter (HOSPITAL_COMMUNITY): Payer: Self-pay | Admitting: Neurosurgery

## 2019-11-05 DIAGNOSIS — E039 Hypothyroidism, unspecified: Secondary | ICD-10-CM | POA: Diagnosis not present

## 2019-11-05 DIAGNOSIS — Z6835 Body mass index (BMI) 35.0-35.9, adult: Secondary | ICD-10-CM | POA: Diagnosis not present

## 2019-11-05 DIAGNOSIS — Z79890 Hormone replacement therapy (postmenopausal): Secondary | ICD-10-CM | POA: Diagnosis not present

## 2019-11-05 DIAGNOSIS — E119 Type 2 diabetes mellitus without complications: Secondary | ICD-10-CM | POA: Diagnosis not present

## 2019-11-05 DIAGNOSIS — I10 Essential (primary) hypertension: Secondary | ICD-10-CM | POA: Diagnosis not present

## 2019-11-05 DIAGNOSIS — Z79899 Other long term (current) drug therapy: Secondary | ICD-10-CM | POA: Diagnosis not present

## 2019-11-05 DIAGNOSIS — M5127 Other intervertebral disc displacement, lumbosacral region: Secondary | ICD-10-CM | POA: Diagnosis not present

## 2019-11-05 DIAGNOSIS — G4730 Sleep apnea, unspecified: Secondary | ICD-10-CM | POA: Diagnosis not present

## 2019-11-05 MED ORDER — IBUPROFEN 800 MG PO TABS: 800.0000 mg | ORAL_TABLET | Freq: Three times a day (TID) | ORAL | 0 refills | Status: DC | PRN

## 2019-11-05 MED ORDER — DOCUSATE SODIUM 100 MG PO CAPS: 100.0000 mg | ORAL_CAPSULE | Freq: Two times a day (BID) | ORAL | 0 refills | Status: DC

## 2019-11-05 MED ORDER — DIAZEPAM 5 MG PO TABS: 5.0000 mg | ORAL_TABLET | Freq: Three times a day (TID) | ORAL | 0 refills | Status: DC | PRN

## 2019-11-05 NOTE — Progress Notes (Signed)
Pt dBP less than 60, pt states that some slight dizziness upon standing but this resolves fairly quickly. Care nurse will conti to monitor.

## 2019-11-05 NOTE — Evaluation (Signed)
Physical Therapy Evaluation and Discharge Patient Details Name: Joy Houston MRN: [id number] DOB: [birth year] Today's Date: 11/05/2019   History of Present Illness  Pt is a 54 y/o female with a PMH significant for hypothyroidism, HTN, heart murmur, DM, a-fib, prior back surgery x3. Pt presents s/p L5-S1 intervertebral discectomy on 11/04/2019.     Clinical Impression  Patient evaluated by Physical Therapy with no further acute PT needs identified. All education has been completed and the patient has no further questions. Pt was able to demonstrate transfers and ambulation with gross modified independence. She was able to complete stair training with light supervision for safety without unsteadiness or knee buckle noted. Pt reporting L drop foot seems to be improving since surgery. Pt was educated on precautions, appropriate activity progression, and car transfer. See below for any follow-up Physical Therapy or equipment needs. PT is signing off. Thank you for this referral.     Follow Up Recommendations No PT follow up;Supervision - Intermittent    Equipment Recommendations  None recommended by PT    Recommendations for Other Services       Precautions / Restrictions Precautions Precautions: Back Precaution Booklet Issued: No Precaution Comments: Verbally reviewed precautions. Pt very familiar with precautions as this is her 4th back surgery.  Required Braces or Orthoses:  (No brace needed order) Restrictions Weight Bearing Restrictions: No      Mobility  Bed Mobility Overal bed mobility: Modified Independent             General bed mobility comments: HOB flat and rails lowered to simulate home environment.   Transfers Overall transfer level: Modified independent Equipment used: None             General transfer comment: No assist to power-up to full stand.   Ambulation/Gait Ambulation/Gait assistance: Modified independent (Device/Increase time) Gait Distance  (Feet): 500 Feet Assistive device: None Gait Pattern/deviations: Step-through pattern;Decreased dorsiflexion - left Gait velocity: Decreased Gait velocity interpretation: >2.62 ft/sec, indicative of community ambulatory General Gait Details: Mild drop food still noted on the LLE however pt reports this is improved since surgery. No overt LOB noted.   Stairs Stairs: Yes Stairs assistance: Supervision Stair Management: One rail Right;Alternating pattern;Forwards Number of Stairs: 10 General stair comments: Pt completing stairs without unsteadiness or any knee buckling noted.   Wheelchair Mobility    Modified Rankin (Stroke Patients Only)       Balance Overall balance assessment: Modified Independent                                           Pertinent Vitals/Pain Pain Assessment: Faces Faces Pain Scale: Hurts a little bit Pain Location: back Pain Descriptors / Indicators: Sore Pain Intervention(s): Monitored during session    Home Living Family/patient expects to be discharged to:: Private residence Living Arrangements: Spouse/significant other Available Help at Discharge: Family Type of Home: House Home Access: Stairs to enter Entrance Stairs-Rails: None Technical brewer of Steps: 3 Home Layout: Two level;Bed/bath upstairs        Prior Function Level of Independence: Independent               Hand Dominance        Extremity/Trunk Assessment   Upper Extremity Assessment Upper Extremity Assessment: Overall WFL for tasks assessed    Lower Extremity Assessment Lower Extremity Assessment: LLE deficits/detail LLE Deficits / Details: Decreased muscular  endurance and mild drop foot which is improving per pt report since surgery.     Cervical / Trunk Assessment Cervical / Trunk Assessment: Other exceptions Cervical / Trunk Exceptions: s/p surgery  Communication   Communication: No difficulties  Cognition Arousal/Alertness:  Awake/alert Behavior During Therapy: WFL for tasks assessed/performed Overall Cognitive Status: Within Functional Limits for tasks assessed                                        General Comments      Exercises     Assessment/Plan    PT Assessment Patent does not need any further PT services  PT Problem List         PT Treatment Interventions      PT Goals (Current goals can be found in the Care Plan section)  Acute Rehab PT Goals Patient Stated Goal: Home ASAP PT Goal Formulation: All assessment and education complete, DC therapy    Frequency     Barriers to discharge        Co-evaluation               AM-PAC PT "6 Clicks" Mobility  Outcome Measure Help needed turning from your back to your side while in a flat bed without using bedrails?: None Help needed moving from lying on your back to sitting on the side of a flat bed without using bedrails?: None Help needed moving to and from a bed to a chair (including a wheelchair)?: None Help needed standing up from a chair using your arms (e.g., wheelchair or bedside chair)?: None Help needed to walk in hospital room?: None Help needed climbing 3-5 steps with a railing? : None 6 Click Score: 24    End of Session Equipment Utilized During Treatment: Gait belt Activity Tolerance: Patient tolerated treatment well Patient left: in bed;with call bell/phone within reach;with family/visitor present Nurse Communication:  (Left message for RN pt is ready for d/c) PT Visit Diagnosis: Other symptoms and signs involving the nervous system (R29.898)    Time: 2683-4196 PT Time Calculation (min) (ACUTE ONLY): 11 min   Charges:   PT Evaluation $PT Eval Low Complexity: 1 Low          Joy Houston, PT, DPT Acute Rehabilitation Services Pager: [phone] Office: [phone]   Joy Houston 11/05/2019, 10:21 AM

## 2019-11-05 NOTE — Progress Notes (Signed)
Pt inquiring about why she's being given Ancef IV. Margo Aye paged for clarity. Pt was not will to take med w/o clarity. No note explaining need for med. Per Meyran, med "profilactic for back surgery pts".

## 2019-11-05 NOTE — Discharge Instructions (Signed)

## 2019-11-05 NOTE — Plan of Care (Signed)
Adequate for discharge.

## 2019-11-05 NOTE — Plan of Care (Signed)

## 2019-11-05 NOTE — Discharge Summary (Signed)
Physician Discharge Summary     Providing Compassionate, Quality Care - Together   Patient ID: Joy Houston MRN: [id number] DOB/AGE: [birth year] 54 y.o.  Admit date: 11/04/2019 Discharge date: 11/05/2019  Admission Diagnoses: Lumbar herniated disc  Discharge Diagnoses:  Active Problems:   Lumbar herniated disc   Discharged Condition: good  Hospital Course: Patient underwent a left L5-S1 intervertebral discectomy by Dr. Arnoldo Morale on 11/04/2019. She was admitted to [facility] overnight for observation. Her postoperative course has been uncomplicated. She is ambulating independently and without difficulty. She is tolerating a normal diet. She is not having any bowel or bladder dysfunction. Her pain is well-controlled with oral pain medication. She is ready for discharge home.   Consults: None  Significant Diagnostic Studies: DG Lumbar Spine 2-3 Views  Result Date: 11/04/2019 CLINICAL DATA:  Intraoperative localization EXAM: LUMBAR SPINE - 2-3 VIEW COMPARISON:  MR lumbar spine, 08/18/2019 FINDINGS: Intraoperative lateral views of the lumbar spine demonstrate instrument localization over the L5-S1 disc space. IMPRESSION: Intraoperative lateral views of the lumbar spine demonstrate instrument localization over the L5-S1 disc space. Electronically Signed   By: Eddie Candle M.D.   On: 11/04/2019 09:29     Treatments: Surgery: Left L5-S1 intervertebral discectomy using micro-dissection  Discharge Exam: Blood pressure (!) 119/56, pulse 67, temperature 98.4 F (36.9 C), temperature source Oral, resp. rate 16, height 5' 5.5" (1.664 m), weight 98.5 kg, SpO2 98 %.   Per report: Alert and oriented x 4 PERRLA CN II-XII grossly intact MAE, Strength and sensation intact Incision is covered with Honeycomb dressing and Steri Strips; Dressing is clean, dry, and intact   Disposition: Discharge disposition: 01-Home or Self Care        Allergies as of 11/05/2019      Reactions   Angiotensin  Receptor Blockers Swelling   Angioedema Had past reaction of arb and angioedema with ace   Lisinopril Swelling   Morphine And Related Itching   Slight itching around nose area      Medication List    TAKE these medications   cyanocobalamin 1000 MCG/ML injection Commonly known as: (VITAMIN B-12) INJECT 1 ML INTO THE MUSCLE ONCE WEEKLY X 4 WEEKS AND THEN ONCE MONTHLY.   DHEA PO Take 1 capsule by mouth daily.   diazepam 5 MG tablet Commonly known as: Valium Take 1 tablet (5 mg total) by mouth every 8 (eight) hours as needed for anxiety.   docusate sodium 100 MG capsule Commonly known as: COLACE Take 1 capsule (100 mg total) by mouth 2 (two) times daily.   EPINEPHrine 0.3 mg/0.3 mL Soaj injection Commonly known as: EPI-PEN Inject 0.3 mg into the muscle as needed for anaphylaxis.   GLUTATHIONE PO Take 7 Pump by mouth daily.   ibuprofen 800 MG tablet Commonly known as: ADVIL Take 1 tablet (800 mg total) by mouth every 8 (eight) hours as needed.   metoprolol succinate 25 MG 24 hr tablet Commonly known as: TOPROL-XL Take 25 mg by mouth daily.   MSM 1000 MG Tabs Take 1,000 mg by mouth daily.   NP Thyroid 15 MG tablet Generic drug: thyroid Take 15 mg by mouth daily.   SIMILASE PO Take 1 tablet by mouth 3 (three) times daily with meals.   SYRINGE 3CC/25GX1" 25G X 1" 3 ML Misc Use as directed with b12 injections.   valACYclovir 1000 MG tablet Commonly known as: VALTREX Take 1,000 mg by mouth daily.   VITAMIN K2-VITAMIN D3 PO Take 1 tablet by mouth daily.  Follow-up Information    Newman Pies, MD. Schedule an appointment as soon as possible for a visit in 2 week(s).   Specialty: Neurosurgery Contact information: [address] [place] [address] [phone]               Signed: Patricia Nettle 11/05/2019, 8:08 AM

## 2019-11-05 NOTE — Progress Notes (Signed)
Patient D/C home with husband and will take morning medication at home.

## 2019-11-05 NOTE — TOC Transition Note (Signed)
Transition of Care [facility]) - CM/SW Discharge Note   Patient Details  Name: Joy Houston MRN: [id number] Date of Birth: 1965/12/01  Transition of Care [facility]) CM/SW Contact:  Pollie Friar, RN Phone Number: 11/05/2019, 10:11 AM   Clinical Narrative:    Pt discharging home with self care. No f/u per PT.  Pt has transportation home.    Final next level of care: Home/Self Care Barriers to Discharge: No Barriers Identified   Patient Goals and CMS Choice        Discharge Placement                       Discharge Plan and Services                                     Social Determinants of Health (SDOH) Interventions     Readmission Risk Interventions No flowsheet data found.

## 2019-12-12 DIAGNOSIS — R7982 Elevated C-reactive protein (CRP): Secondary | ICD-10-CM | POA: Diagnosis not present

## 2019-12-12 DIAGNOSIS — E039 Hypothyroidism, unspecified: Secondary | ICD-10-CM | POA: Diagnosis not present

## 2019-12-12 DIAGNOSIS — R5383 Other fatigue: Secondary | ICD-10-CM | POA: Diagnosis not present

## 2019-12-12 DIAGNOSIS — B2790 Infectious mononucleosis, unspecified without complication: Secondary | ICD-10-CM | POA: Diagnosis not present

## 2019-12-17 ENCOUNTER — Ambulatory Visit: Payer: BC Managed Care – PPO | Admitting: Dermatology

## 2019-12-25 ENCOUNTER — Encounter: Payer: Self-pay | Admitting: Dermatology

## 2019-12-25 ENCOUNTER — Ambulatory Visit: Payer: BC Managed Care – PPO | Admitting: Dermatology

## 2019-12-25 ENCOUNTER — Other Ambulatory Visit: Payer: Self-pay

## 2019-12-25 DIAGNOSIS — Z1283 Encounter for screening for malignant neoplasm of skin: Secondary | ICD-10-CM | POA: Diagnosis not present

## 2019-12-25 DIAGNOSIS — D485 Neoplasm of uncertain behavior of skin: Secondary | ICD-10-CM

## 2019-12-25 DIAGNOSIS — L821 Other seborrheic keratosis: Secondary | ICD-10-CM | POA: Diagnosis not present

## 2019-12-25 DIAGNOSIS — L720 Epidermal cyst: Secondary | ICD-10-CM | POA: Diagnosis not present

## 2019-12-25 DIAGNOSIS — L814 Other melanin hyperpigmentation: Secondary | ICD-10-CM | POA: Diagnosis not present

## 2019-12-25 DIAGNOSIS — D1800 Hemangioma unspecified site: Secondary | ICD-10-CM

## 2019-12-25 DIAGNOSIS — D229 Melanocytic nevi, unspecified: Secondary | ICD-10-CM

## 2019-12-25 DIAGNOSIS — Z86018 Personal history of other benign neoplasm: Secondary | ICD-10-CM

## 2019-12-25 DIAGNOSIS — L578 Other skin changes due to chronic exposure to nonionizing radiation: Secondary | ICD-10-CM

## 2019-12-25 DIAGNOSIS — S30861A Insect bite (nonvenomous) of abdominal wall, initial encounter: Secondary | ICD-10-CM | POA: Diagnosis not present

## 2019-12-25 NOTE — Progress Notes (Signed)
Follow-Up Visit   Subjective  Joy Houston is a 54 y.o. female who presents for the following: FBSE.  Patient here for full body skin exam and skin cancer screening. Patient with a history of dysplastic nevus. She has a tick bite on her abdomen that has been there for a few months. Patient also has what she thinks is a cyst at her right axilla that has been there a few months also. No tenderness, no draining.  Family h/o melanoma in patient's father.   The following portions of the chart were reviewed this encounter and updated as appropriate:  Tobacco  Allergies  Meds  Problems  Med Hx  Surg Hx  Fam Hx      Review of Systems:  No other skin or systemic complaints except as noted in HPI or Assessment and Plan.  Objective  Well appearing patient in no apparent distress; mood and affect are within normal limits.  A full examination was performed including scalp, head, eyes, ears, nose, lips, neck, chest, axillae, abdomen, back, buttocks, bilateral upper extremities, bilateral lower extremities, hands, feet, fingers, toes, fingernails, and toenails. All findings within normal limits unless otherwise noted below.  Objective  Right Axilla, left labia majora: Subcutaneous nodule.   Objective  Right periumbilical: 2.6VZ pink papule R/o foreign body granuloma over BCC or other      Assessment & Plan  Epidermal inclusion cyst Right Axilla, left labia majora  Benign-appearing.  Observation.  Call clinic for new or changing lesions.    Discussed option of excision if they become symptomatic, including resulting scar.    Neoplasm of uncertain behavior of skin Right periumbilical  Epidermal / dermal shaving  Lesion diameter (cm):  0.5 Informed consent: discussed and consent obtained   Timeout: patient name, date of birth, surgical site, and procedure verified   Patient was prepped and draped in usual sterile fashion: area prepped with isopropyl  alcohol. Anesthesia: the lesion was anesthetized in a standard fashion   Anesthetic:  1% lidocaine w/ epinephrine 1-100,000 buffered w/ 8.4% NaHCO3 Instrument used: flexible razor blade   Hemostasis achieved with: aluminum chloride   Outcome: patient tolerated procedure well   Post-procedure details: wound care instructions given   Additional details:  Mupirocin and a bandage applied  Skin / nail biopsy Type of biopsy: punch   Informed consent: discussed and consent obtained   Timeout: patient name, date of birth, surgical site, and procedure verified   Procedure prep:  Patient was prepped and draped in usual sterile fashion Prep type:  Isopropyl alcohol Anesthesia: the lesion was anesthetized in a standard fashion   Anesthetic:  1% lidocaine w/ epinephrine 1-100,000 buffered w/ 8.4% NaHCO3 Punch size:  2 mm Hemostasis achieved with: pressure and electrodesiccation   Outcome: patient tolerated procedure well   Post-procedure details: wound care instructions given   Additional details:  Petrolatum and a pressure dressing were applied  Specimen 1 - Surgical pathology Differential Diagnosis:  Check Margins: No 0.5cm pink papule R/o foreign body granuloma over BCC or other  47mm punch biopsy to remove dark central focus at base of shave biopsy site also done following shave removal, no suture or gel foam. Hemostasis performed with AlCl and cautery.    Lentigines - Scattered tan macules - Discussed due to sun exposure - Benign, observe - Call for any changes  Seborrheic Keratoses - Stuck-on, waxy, tan-brown papules and plaques  - Discussed benign etiology and prognosis. - Observe - Call for any changes  Melanocytic Nevi - Tan-brown and/or pink-flesh-colored symmetric macules and papules - Benign appearing on exam today - Observation - Call clinic for new or changing moles - Recommend daily use of broad spectrum spf 30+ sunscreen to sun-exposed areas.   Hemangiomas - Red  papules - Discussed benign nature - Observe - Call for any changes  Actinic Damage - chronic, secondary to cumulative UV exposure - diffuse scaly erythematous macules with underlying dyspigmentation - Recommend daily broad spectrum sunscreen SPF 30+ to sun-exposed areas, reapply every 2 hours as needed.  - Call for new or changing lesions.  Skin cancer screening performed today.  History of Dysplastic Nevi - No evidence of recurrence today at left upper back/moderate - Recommend regular full body skin exams - Recommend daily broad spectrum sunscreen SPF 30+ to sun-exposed areas, reapply every 2 hours as needed.  - Call if any new or changing lesions are noted between office visits   Return in about 1 year (around 12/24/2020) for TBSE.  Graciella Belton, RMA, am acting as scribe for Forest Gleason, MD .  Documentation: I have reviewed the above documentation for accuracy and completeness, and I agree with the above.  Forest Gleason, MD

## 2019-12-25 NOTE — Patient Instructions (Signed)
Melanoma ABCDEs  Melanoma is the most dangerous type of skin cancer, and is the leading cause of death from skin disease.  You are more likely to develop melanoma if you:  Have light-colored skin, light-colored eyes, or red or blond hair  Spend a lot of time in the sun  Tan regularly, either outdoors or in a tanning bed  Have had blistering sunburns, especially during childhood  Have a close family member who has had a melanoma  Have atypical moles or large birthmarks  Early detection of melanoma is key since treatment is typically straightforward and cure rates are extremely high if we catch it early.   The first sign of melanoma is often a change in a mole or a new dark spot.  The ABCDE system is a way of remembering the signs of melanoma.  A for asymmetry:  The two halves do not match. B for border:  The edges of the growth are irregular. C for color:  A mixture of colors are present instead of an even brown color. D for diameter:  Melanomas are usually (but not always) greater than 6mm - the size of a pencil eraser. E for evolution:  The spot keeps changing in size, shape, and color.  Please check your skin once per month between visits. You can use a small mirror in front and a large mirror behind you to keep an eye on the back side or your body.   If you see any new or changing lesions before your next follow-up, please call to schedule a visit.  Please continue daily skin protection including broad spectrum sunscreen SPF 30+ to sun-exposed areas, reapplying every 2 hours as needed when you're outdoors.   Wound Care Instructions  1. Cleanse wound gently with soap and water once a day then pat dry with clean gauze. Apply a thing coat of Petrolatum (petroleum jelly, "Vaseline") over the wound (unless you have an allergy to this). We recommend that you use a new, sterile tube of Vaseline. Do not pick or remove scabs. Do not remove the yellow or white "healing tissue" from the  base of the wound.  2. Cover the wound with fresh, clean, nonstick gauze and secure with paper tape. You may use Band-Aids in place of gauze and tape if the would is small enough, but would recommend trimming much of the tape off as there is often too much. Sometimes Band-Aids can irritate the skin.  3. You should call the office for your biopsy report after 1 week if you have not already been contacted.  4. If you experience any problems, such as abnormal amounts of bleeding, swelling, significant bruising, significant pain, or evidence of infection, please call the office immediately.  5. FOR ADULT SURGERY PATIENTS: If you need something for pain relief you may take 1 extra strength Tylenol (acetaminophen) AND 2 Ibuprofen (200mg each) together every 4 hours as needed for pain. (do not take these if you are allergic to them or if you have a reason you should not take them.) Typically, you may only need pain medication for 1 to 3 days.      

## 2019-12-27 DIAGNOSIS — I480 Paroxysmal atrial fibrillation: Secondary | ICD-10-CM | POA: Diagnosis not present

## 2019-12-27 DIAGNOSIS — Z23 Encounter for immunization: Secondary | ICD-10-CM | POA: Diagnosis not present

## 2019-12-31 ENCOUNTER — Telehealth: Payer: Self-pay

## 2019-12-31 NOTE — Telephone Encounter (Signed)
Patient advised of biopsy result. 

## 2019-12-31 NOTE — Progress Notes (Signed)
Skin , right periumbilical TICK, WITH DERMAL INFLAMMATORY REACTION  Inflammation surrounding tick parts. No additional treatment needed unless it is still bothersome.

## 2019-12-31 NOTE — Telephone Encounter (Signed)
-----   Message from Alfonso Patten, MD sent at 12/31/2019  9:58 AM EST ----- Skin , right periumbilical TICK, WITH DERMAL INFLAMMATORY REACTION  Inflammation surrounding tick parts. No additional treatment needed unless it is still bothersome.

## 2020-01-03 ENCOUNTER — Other Ambulatory Visit: Payer: Self-pay

## 2020-01-03 ENCOUNTER — Ambulatory Visit: Payer: BC Managed Care – PPO | Admitting: Internal Medicine

## 2020-01-03 DIAGNOSIS — K760 Fatty (change of) liver, not elsewhere classified: Secondary | ICD-10-CM | POA: Diagnosis not present

## 2020-01-03 DIAGNOSIS — G4733 Obstructive sleep apnea (adult) (pediatric): Secondary | ICD-10-CM

## 2020-01-03 DIAGNOSIS — I480 Paroxysmal atrial fibrillation: Secondary | ICD-10-CM

## 2020-01-03 DIAGNOSIS — R945 Abnormal results of liver function studies: Secondary | ICD-10-CM

## 2020-01-03 DIAGNOSIS — E1165 Type 2 diabetes mellitus with hyperglycemia: Secondary | ICD-10-CM

## 2020-01-03 DIAGNOSIS — E782 Mixed hyperlipidemia: Secondary | ICD-10-CM | POA: Diagnosis not present

## 2020-01-03 DIAGNOSIS — M5442 Lumbago with sciatica, left side: Secondary | ICD-10-CM

## 2020-01-03 DIAGNOSIS — E538 Deficiency of other specified B group vitamins: Secondary | ICD-10-CM

## 2020-01-03 DIAGNOSIS — I10 Essential (primary) hypertension: Secondary | ICD-10-CM

## 2020-01-03 MED ORDER — EPINEPHRINE 0.3 MG/0.3ML IJ SOAJ: 0.3000 mg | INTRAMUSCULAR | 0 refills | Status: DC | PRN

## 2020-01-03 NOTE — Progress Notes (Signed)
Patient ID: Joy Houston, female   DOB: 1965-11-08, 54 y.o.   MRN: [id number]   Subjective:    Patient ID: Joy Houston, female    DOB: [birth year], 54 y.o.   MRN: [id number]  HPI This visit occurred during the SARS-CoV-2 public health emergency.  Safety protocols were in place, including screening questions prior to the visit, additional usage of staff PPE, and extensive cleaning of exam room while observing appropriate contact time as indicated for disinfecting solutions.  Patient here for a scheduled follow up. She reports she is overall doing better.  Still experiencing intermittent episodes of symptomatic afib/aflutter.  Does affect her quality of life.  Recently evaluated by Dr Marcello Moores. Discussed ablation.  We discussed anticoagulation.    She recently had back surgery.  Is walking better.  Foot drop has improved.  Still with some left great toe numbness.  Breathing overall stable currently.  No increased cough or congestion reported.  She has adjusted her diet.  Is gluten free and dairy free.  Has cut down on sugar intake.  Discussed labs and thyroid supplementations.  Discussed recent labs.  Thyroid tests wnl.  Vitamin D level just checked and wnl.     Past Medical History:  Diagnosis Date  . Anemia   . Angio-edema   . Atrial fibrillation ([place])   . Complication of anesthesia   . Diabetes mellitus without complication (HCC)    diet controlled  . Dysplastic nevus 06/27/2018   Left upper back. Moderate atypia, limited margins free.   . Eczema   . Randell Patient infection   . Family history of anesthesia complication    Mother - confusion  . Fatty liver   . Heart murmur    Slight - "nothing to worry about"  . Hemorrhoid   . Hypertension   . Hypothyroidism   . PONV (postoperative nausea and vomiting)    "only with c-sections"  . Sleep apnea    mild   Past Surgical History:  Procedure Laterality Date  . CESAREAN SECTION    . CHOLECYSTECTOMY N/A 09/07/2018   Procedure:  LAPAROSCOPIC CHOLECYSTECTOMY;  Surgeon: Herbert Pun, MD;  Location: [facility] ORS;  Service: General;  Laterality: N/A;  . FINGER SURGERY     pin to 3rd finger Right hand  . LUMBAR LAMINECTOMY/DECOMPRESSION MICRODISCECTOMY  12/15/2011   Procedure: LUMBAR LAMINECTOMY/DECOMPRESSION MICRODISCECTOMY 1 LEVEL;  Surgeon: Ophelia Charter, MD;  Location: [place] [facility];  Service: Neurosurgery;  Laterality: Right;  RIGHT Lumbar four-Five diskectomy  . LUMBAR LAMINECTOMY/DECOMPRESSION MICRODISCECTOMY N/A 11/04/2019   Procedure: MICRODISCECTOMY LEFT LUMBAR FIVE SACRAL ONE;  Surgeon: Newman Pies, MD;  Location: [place];  Service: Neurosurgery;  Laterality: N/A;  3C  . MICRODISCECTOMY LUMBAR     L4-5  . WISDOM TOOTH EXTRACTION     Family History  Problem Relation Age of Onset  . GER disease Mother   . Diabetes Father   . Cancer Father        Melanoma, Prostate  . Heart disease Father   . Heart disease Maternal Grandfather   . Diabetes Paternal Grandfather   . Breast cancer Sister 61   Social History   Socioeconomic History  . Marital status: Married    Spouse name: Not on file  . Number of children: 2  . Years of education: Not on file  . Highest education level: Not on file  Occupational History    Employer: [employer]  Tobacco Use  . Smoking status: Never Smoker  .  Smokeless tobacco: Never Used  Vaping Use  . Vaping Use: Never used  Substance and Sexual Activity  . Alcohol use: Not Currently    Alcohol/week: 0.0 standard drinks  . Drug use: No  . Sexual activity: Not on file  Other Topics Concern  . Not on file  Social History Narrative   Nurse    Married   Social Determinants of Health   Financial Resource Strain:   . Difficulty of Paying Living Expenses: Not on file  Food Insecurity:   . Worried About Charity fundraiser in the Last Year: Not on file  . Ran Out of Food in the Last Year: Not on file  Transportation Needs:   . Lack of Transportation  (Medical): Not on file  . Lack of Transportation (Non-Medical): Not on file  Physical Activity:   . Days of Exercise per Week: Not on file  . Minutes of Exercise per Session: Not on file  Stress:   . Feeling of Stress : Not on file  Social Connections:   . Frequency of Communication with Friends and Family: Not on file  . Frequency of Social Gatherings with Friends and Family: Not on file  . Attends Religious Services: Not on file  . Active Member of Clubs or Organizations: Not on file  . Attends Archivist Meetings: Not on file  . Marital Status: Not on file    Outpatient Encounter Medications as of 01/03/2020  Medication Sig  . cyanocobalamin (,VITAMIN B-12,) 1000 MCG/ML injection INJECT 1 ML INTO THE MUSCLE ONCE WEEKLY X 4 WEEKS AND THEN ONCE MONTHLY.  . Digestive Enzymes (SIMILASE PO) Take 1 tablet by mouth 3 (three) times daily with meals.  Marland Kitchen EPINEPHrine (EPIPEN 2-PAK) 0.3 mg/0.3 mL IJ SOAJ injection Inject 0.3 mg into the muscle as needed for anaphylaxis.  Marland Kitchen GLUTATHIONE PO Take 7 Pump by mouth daily.   . Methylsulfonylmethane (MSM) 1000 MG TABS Take 1,000 mg by mouth daily.  . metoprolol succinate (TOPROL-XL) 25 MG 24 hr tablet Take 25 mg by mouth daily.  . Nutritional Supplements (DHEA PO) Take 1 capsule by mouth daily.  . Syringe/Needle, Disp, (SYRINGE 3CC/25GX1") 25G X 1" 3 ML MISC Use as directed with b12 injections.  Marland Kitchen thyroid (NP THYROID) 15 MG tablet Take 15 mg by mouth daily.  . Vitamin D-Vitamin K (VITAMIN K2-VITAMIN D3 PO) Take 1 tablet by mouth daily.  . [DISCONTINUED] diazepam (VALIUM) 5 MG tablet Take 1 tablet (5 mg total) by mouth every 8 (eight) hours as needed for anxiety.  . [DISCONTINUED] docusate sodium (COLACE) 100 MG capsule Take 1 capsule (100 mg total) by mouth 2 (two) times daily.  . [DISCONTINUED] EPINEPHrine 0.3 mg/0.3 mL IJ SOAJ injection Inject 0.3 mg into the muscle as needed for anaphylaxis.   . [DISCONTINUED] ibuprofen (ADVIL) 800 MG  tablet Take 1 tablet (800 mg total) by mouth every 8 (eight) hours as needed.  . [DISCONTINUED] valACYclovir (VALTREX) 1000 MG tablet Take 1,000 mg by mouth daily.   No facility-administered encounter medications on file as of 01/03/2020.    Review of Systems  Constitutional: Negative for appetite change and unexpected weight change.  HENT: Negative for congestion and sinus pressure.   Respiratory: Negative for cough and chest tightness.   Cardiovascular: Negative for chest pain and leg swelling.       Intermittent episodes of symptomatic afib/aflutter as outlined.    Genitourinary: Negative for difficulty urinating and dysuria.  Musculoskeletal: Negative for joint swelling and  myalgias.  Skin: Negative for color change and rash.  Neurological: Negative for dizziness, light-headedness and headaches.  Psychiatric/Behavioral: Negative for agitation and dysphoric mood.       Objective:    Physical Exam Vitals reviewed.  Constitutional:      General: She is not in acute distress.    Appearance: Normal appearance.  HENT:     Head: Normocephalic and atraumatic.     Right Ear: External ear normal.     Left Ear: External ear normal.  Eyes:     General: No scleral icterus.       Right eye: No discharge.        Left eye: No discharge.     Conjunctiva/sclera: Conjunctivae normal.  Neck:     Thyroid: No thyromegaly.  Cardiovascular:     Rate and Rhythm: Normal rate and regular rhythm.  Pulmonary:     Effort: No respiratory distress.     Breath sounds: Normal breath sounds. No wheezing.  Abdominal:     General: Bowel sounds are normal.     Palpations: Abdomen is soft.     Tenderness: There is no abdominal tenderness.  Musculoskeletal:        General: No swelling or tenderness.     Cervical back: Neck supple. No tenderness.  Lymphadenopathy:     Cervical: No cervical adenopathy.  Skin:    Findings: No erythema or rash.  Neurological:     Mental Status: She is alert.   Psychiatric:        Mood and Affect: Mood normal.        Behavior: Behavior normal.     BP 128/74   Pulse 73   Temp 98.4 F (36.9 C) (Oral)   Resp 16   Ht 5' 6"  (1.676 m)   Wt 224 lb (101.6 kg)   SpO2 97%   BMI 36.15 kg/m  Wt Readings from Last 3 Encounters:  01/03/20 224 lb (101.6 kg)  11/04/19 217 lb 2.5 oz (98.5 kg)  10/31/19 217 lb 2 oz (98.5 kg)     Lab Results  Component Value Date   WBC 7.5 10/30/2019   HGB 15.3 (H) 10/30/2019   HCT 45.6 10/30/2019   PLT 203.0 10/30/2019   GLUCOSE 88 10/30/2019   CHOL 158 10/30/2019   TRIG 62.0 10/30/2019   HDL 59.40 10/30/2019   LDLDIRECT 145.4 03/01/2013   LDLCALC 86 10/30/2019   ALT 17 10/30/2019   AST 15 10/30/2019   NA 139 10/30/2019   K 4.1 10/30/2019   CL 104 10/30/2019   CREATININE 0.67 10/30/2019   BUN 12 10/30/2019   CO2 27 10/30/2019   TSH 3.53 10/30/2019   HGBA1C 5.3 10/30/2019   MICROALBUR 1.9 10/30/2019       Assessment & Plan:   Problem List Items Addressed This Visit    Type 2 diabetes mellitus with hyperglycemia ([place])    Has adjusted diet.  Diet, exercise and weight loss.  Follow met b and a1c.       Relevant Orders   Hemoglobin M3T   Basic metabolic panel   VITAMIN D 25 Hydroxy (Vit-D Deficiency, Fractures)   Paroxysmal A-fib (HCC)    Persistent intermittent afib.  Just evaluated by Dr Marcello Moores.  Discussed ablation.  Discussed anticoagulation.  In SR today.  Follow.        Relevant Medications   EPINEPHrine (EPIPEN 2-PAK) 0.3 mg/0.3 mL IJ SOAJ injection   Other Relevant Orders   TSH   T4, free  OSA (obstructive sleep apnea)    Using mouthguard.  Follow.       Low back pain    S/p surgery.  Walking better.  Foot drop - better.  Continue f/u with ortho.       Hypertension    Blood pressure as outlined.  On metoprolol.  Continue metoprolol.  Follow pressures.  Follow metabolic panel.       Relevant Medications   EPINEPHrine (EPIPEN 2-PAK) 0.3 mg/0.3 mL IJ SOAJ injection   Other  Relevant Orders   CBC with Differential/Platelet   Hyperlipidemia, mixed    Low cholesterol diet and exercise.  Follow lipid panel.       Relevant Medications   EPINEPHrine (EPIPEN 2-PAK) 0.3 mg/0.3 mL IJ SOAJ injection   Other Relevant Orders   Lipid panel   Fatty liver    Continue diet, exercise and weight loss.  Follow liver function tests.        B12 deficiency    Receiving B12 injections.       Relevant Orders   Vitamin B12   Abnormal liver function    Has adjusted her diet.  Follow liver panel.       Relevant Orders   Hepatic function panel       Einar Pheasant, MD

## 2020-01-04 ENCOUNTER — Encounter: Payer: Self-pay | Admitting: Internal Medicine

## 2020-01-04 NOTE — Assessment & Plan Note (Signed)
S/p surgery.  Walking better.  Foot drop - better.  Continue f/u with ortho.

## 2020-01-04 NOTE — Assessment & Plan Note (Signed)
Has adjusted her diet.  Follow liver panel.

## 2020-01-04 NOTE — Assessment & Plan Note (Signed)
Blood pressure as outlined.  On metoprolol.  Continue metoprolol.  Follow pressures.  Follow metabolic panel.

## 2020-01-04 NOTE — Assessment & Plan Note (Signed)
Receiving B12 injections.  °

## 2020-01-04 NOTE — Assessment & Plan Note (Signed)
Has adjusted diet.  Diet, exercise and weight loss.  Follow met b and a1c.

## 2020-01-04 NOTE — Assessment & Plan Note (Signed)
Persistent intermittent afib.  Just evaluated by Dr Marcello Moores.  Discussed ablation.  Discussed anticoagulation.  In SR today.  Follow.

## 2020-01-04 NOTE — Assessment & Plan Note (Signed)
Using mouthguard.  Follow.

## 2020-01-04 NOTE — Assessment & Plan Note (Signed)
Low cholesterol diet and exercise.  Follow lipid panel.   

## 2020-01-04 NOTE — Assessment & Plan Note (Signed)
Continue diet, exercise and weight loss.  Follow liver function tests.

## 2020-01-09 ENCOUNTER — Other Ambulatory Visit: Payer: Self-pay | Admitting: Internal Medicine

## 2020-01-09 DIAGNOSIS — Z1231 Encounter for screening mammogram for malignant neoplasm of breast: Secondary | ICD-10-CM

## 2020-02-07 ENCOUNTER — Other Ambulatory Visit: Payer: Self-pay | Admitting: Internal Medicine

## 2020-02-11 ENCOUNTER — Ambulatory Visit
Admission: RE | Admit: 2020-02-11 | Discharge: 2020-02-11 | Disposition: A | Discharge status: Home / Self Care | Payer: BC Managed Care – PPO | Source: Ambulatory Visit | Attending: Internal Medicine | Admitting: Internal Medicine

## 2020-02-11 ENCOUNTER — Other Ambulatory Visit: Payer: Self-pay

## 2020-02-11 DIAGNOSIS — Z1231 Encounter for screening mammogram for malignant neoplasm of breast: Secondary | ICD-10-CM

## 2020-02-17 DIAGNOSIS — Z7901 Long term (current) use of anticoagulants: Secondary | ICD-10-CM | POA: Diagnosis not present

## 2020-02-17 DIAGNOSIS — Z01810 Encounter for preprocedural cardiovascular examination: Secondary | ICD-10-CM | POA: Diagnosis not present

## 2020-02-17 DIAGNOSIS — G4733 Obstructive sleep apnea (adult) (pediatric): Secondary | ICD-10-CM | POA: Diagnosis not present

## 2020-02-17 DIAGNOSIS — R002 Palpitations: Secondary | ICD-10-CM | POA: Diagnosis not present

## 2020-02-17 DIAGNOSIS — I10 Essential (primary) hypertension: Secondary | ICD-10-CM | POA: Diagnosis not present

## 2020-02-17 DIAGNOSIS — I480 Paroxysmal atrial fibrillation: Secondary | ICD-10-CM | POA: Diagnosis not present

## 2020-02-17 DIAGNOSIS — Z20822 Contact with and (suspected) exposure to covid-19: Secondary | ICD-10-CM | POA: Diagnosis not present

## 2020-02-19 DIAGNOSIS — G4733 Obstructive sleep apnea (adult) (pediatric): Secondary | ICD-10-CM | POA: Diagnosis not present

## 2020-02-19 DIAGNOSIS — I119 Hypertensive heart disease without heart failure: Secondary | ICD-10-CM | POA: Diagnosis not present

## 2020-02-19 DIAGNOSIS — R002 Palpitations: Secondary | ICD-10-CM | POA: Diagnosis not present

## 2020-02-19 DIAGNOSIS — R9431 Abnormal electrocardiogram [ECG] [EKG]: Secondary | ICD-10-CM | POA: Diagnosis not present

## 2020-02-19 DIAGNOSIS — Z7901 Long term (current) use of anticoagulants: Secondary | ICD-10-CM | POA: Diagnosis not present

## 2020-02-19 DIAGNOSIS — I483 Typical atrial flutter: Secondary | ICD-10-CM | POA: Diagnosis not present

## 2020-02-19 DIAGNOSIS — I480 Paroxysmal atrial fibrillation: Secondary | ICD-10-CM | POA: Diagnosis not present

## 2020-02-19 DIAGNOSIS — I493 Ventricular premature depolarization: Secondary | ICD-10-CM | POA: Diagnosis not present

## 2020-02-19 DIAGNOSIS — I491 Atrial premature depolarization: Secondary | ICD-10-CM | POA: Diagnosis not present

## 2020-02-20 DIAGNOSIS — I483 Typical atrial flutter: Secondary | ICD-10-CM | POA: Diagnosis not present

## 2020-02-20 DIAGNOSIS — R9431 Abnormal electrocardiogram [ECG] [EKG]: Secondary | ICD-10-CM | POA: Diagnosis not present

## 2020-02-20 DIAGNOSIS — I119 Hypertensive heart disease without heart failure: Secondary | ICD-10-CM | POA: Diagnosis not present

## 2020-02-20 DIAGNOSIS — Z7901 Long term (current) use of anticoagulants: Secondary | ICD-10-CM | POA: Diagnosis not present

## 2020-02-20 DIAGNOSIS — I493 Ventricular premature depolarization: Secondary | ICD-10-CM | POA: Diagnosis not present

## 2020-02-20 DIAGNOSIS — I480 Paroxysmal atrial fibrillation: Secondary | ICD-10-CM | POA: Diagnosis not present

## 2020-02-20 DIAGNOSIS — G4733 Obstructive sleep apnea (adult) (pediatric): Secondary | ICD-10-CM | POA: Diagnosis not present

## 2020-02-20 DIAGNOSIS — R002 Palpitations: Secondary | ICD-10-CM | POA: Diagnosis not present

## 2020-02-20 DIAGNOSIS — I491 Atrial premature depolarization: Secondary | ICD-10-CM | POA: Diagnosis not present

## 2020-03-04 ENCOUNTER — Other Ambulatory Visit (INDEPENDENT_AMBULATORY_CARE_PROVIDER_SITE_OTHER): Payer: BC Managed Care – PPO

## 2020-03-04 ENCOUNTER — Other Ambulatory Visit: Payer: Self-pay

## 2020-03-04 DIAGNOSIS — I10 Essential (primary) hypertension: Secondary | ICD-10-CM

## 2020-03-04 DIAGNOSIS — E1165 Type 2 diabetes mellitus with hyperglycemia: Secondary | ICD-10-CM

## 2020-03-04 DIAGNOSIS — R945 Abnormal results of liver function studies: Secondary | ICD-10-CM

## 2020-03-04 DIAGNOSIS — I480 Paroxysmal atrial fibrillation: Secondary | ICD-10-CM | POA: Diagnosis not present

## 2020-03-04 DIAGNOSIS — E782 Mixed hyperlipidemia: Secondary | ICD-10-CM

## 2020-03-04 DIAGNOSIS — E538 Deficiency of other specified B group vitamins: Secondary | ICD-10-CM | POA: Diagnosis not present

## 2020-03-04 LAB — BASIC METABOLIC PANEL
BUN: 17 mg/dL (ref 6–23)
CO2: 25 mEq/L (ref 19–32)
Calcium: 9.5 mg/dL (ref 8.4–10.5)
Chloride: 105 mEq/L (ref 96–112)
Creatinine, Ser: 0.66 mg/dL (ref 0.40–1.20)
GFR: 100.75 mL/min (ref >60.00)
Glucose, Bld: 107 mg/dL — ABNORMAL HIGH (ref 70–99)
Potassium: 4.2 mEq/L (ref 3.5–5.1)
Sodium: 138 mEq/L (ref 135–145)

## 2020-03-04 LAB — CBC WITH DIFFERENTIAL/PLATELET
Basophils Absolute: 0.2 10*3/uL — ABNORMAL HIGH (ref 0.0–0.1)
Basophils Relative: 2.3 % (ref 0.0–3.0)
Eosinophils Absolute: 0.2 10*3/uL (ref 0.0–0.7)
Eosinophils Relative: 2.9 % (ref 0.0–5.0)
HCT: 46 % (ref 36.0–46.0)
Hemoglobin: 15.5 g/dL — ABNORMAL HIGH (ref 12.0–15.0)
Lymphocytes Relative: 26.9 % (ref 12.0–46.0)
Lymphs Abs: 2 10*3/uL (ref 0.7–4.0)
MCHC: 33.7 g/dL (ref 30.0–36.0)
MCV: 92.6 fl (ref 78.0–100.0)
Monocytes Absolute: 0.6 10*3/uL (ref 0.1–1.0)
Monocytes Relative: 8 % (ref 3.0–12.0)
Neutro Abs: 4.5 10*3/uL (ref 1.4–7.7)
Neutrophils Relative %: 59.9 % (ref 43.0–77.0)
Platelets: 211 10*3/uL (ref 150.0–400.0)
RBC: 4.97 Mil/uL (ref 3.87–5.11)
RDW: 13.7 % (ref 11.5–15.5)
WBC: 7.5 10*3/uL (ref 4.0–10.5)

## 2020-03-04 LAB — HEPATIC FUNCTION PANEL
ALT: 24 U/L (ref 0–35)
AST: 21 U/L (ref 0–37)
Albumin: 4.5 g/dL (ref 3.5–5.2)
Alkaline Phosphatase: 84 U/L (ref 39–117)
Bilirubin, Direct: 0.1 mg/dL (ref 0.0–0.3)
Total Bilirubin: 0.5 mg/dL (ref 0.2–1.2)
Total Protein: 7.2 g/dL (ref 6.0–8.3)

## 2020-03-04 LAB — LIPID PANEL
Cholesterol: 174 mg/dL (ref 0–200)
HDL: 67.1 mg/dL (ref >39.00)
LDL Cholesterol: 93 mg/dL (ref 0–99)
NonHDL: 107.13
Total CHOL/HDL Ratio: 3
Triglycerides: 72 mg/dL (ref 0.0–149.0)
VLDL: 14.4 mg/dL (ref 0.0–40.0)

## 2020-03-04 LAB — T4, FREE: Free T4: 0.59 ng/dL — ABNORMAL LOW (ref 0.60–1.60)

## 2020-03-04 LAB — HEMOGLOBIN A1C: Hgb A1c MFr Bld: 5.3 % (ref 4.6–6.5)

## 2020-03-04 LAB — VITAMIN B12: Vitamin B-12: 407 pg/mL (ref 211–911)

## 2020-03-04 LAB — VITAMIN D 25 HYDROXY (VIT D DEFICIENCY, FRACTURES): VITD: 38.54 ng/mL (ref 30.00–100.00)

## 2020-03-04 LAB — TSH: TSH: 4.91 u[IU]/mL — ABNORMAL HIGH (ref 0.35–4.50)

## 2020-03-06 ENCOUNTER — Encounter: Payer: Self-pay | Admitting: Internal Medicine

## 2020-03-06 ENCOUNTER — Ambulatory Visit: Payer: BC Managed Care – PPO | Admitting: Internal Medicine

## 2020-03-06 ENCOUNTER — Other Ambulatory Visit: Payer: Self-pay

## 2020-03-06 DIAGNOSIS — I480 Paroxysmal atrial fibrillation: Secondary | ICD-10-CM | POA: Diagnosis not present

## 2020-03-06 DIAGNOSIS — I10 Essential (primary) hypertension: Secondary | ICD-10-CM

## 2020-03-06 DIAGNOSIS — K760 Fatty (change of) liver, not elsewhere classified: Secondary | ICD-10-CM | POA: Diagnosis not present

## 2020-03-06 DIAGNOSIS — M5442 Lumbago with sciatica, left side: Secondary | ICD-10-CM

## 2020-03-06 DIAGNOSIS — E1165 Type 2 diabetes mellitus with hyperglycemia: Secondary | ICD-10-CM

## 2020-03-06 DIAGNOSIS — G4733 Obstructive sleep apnea (adult) (pediatric): Secondary | ICD-10-CM

## 2020-03-06 DIAGNOSIS — E538 Deficiency of other specified B group vitamins: Secondary | ICD-10-CM

## 2020-03-06 DIAGNOSIS — E782 Mixed hyperlipidemia: Secondary | ICD-10-CM

## 2020-03-06 MED ORDER — CYANOCOBALAMIN 1000 MCG/ML IJ SOLN: INTRAMUSCULAR | 1 refills | Status: DC

## 2020-03-06 MED ORDER — SYRINGE 25G X 1" 3 ML MISC
2 refills | Status: DC
Start: 2020-03-06 — End: 2022-09-05

## 2020-03-06 NOTE — Progress Notes (Signed)
Patient ID: Joy Houston, female   DOB: 1965/09/27, 55 y.o.   MRN: [id number]   Subjective:    Patient ID: Joy Houston, female    DOB: 07/11/1965, 55 y.o.   MRN: [id number]  HPI This visit occurred during the SARS-CoV-2 public health emergency.  Safety protocols were in place, including screening questions prior to the visit, additional usage of staff PPE, and extensive cleaning of exam room while observing appropriate contact time as indicated for disinfecting solutions.  Patient here for a scheduled follow up.  Here to follow up regarding her blood pressure, blood sugar.  Is s/p recent ablation.  On xarelto.  Has done well since her ablation.  Feels better.  Started walking.  Breathing stable.  Has f/u in 03/2020 - cardiology.  No chest pain.  No acid reflux reported.  No abdominal pain or bowel change reported.  Using dental device - to treat sleep apnea.  Had questions about her labs.  Discussed diet and exercise.     Past Medical History:  Diagnosis Date  . Anemia   . Angio-edema   . Atrial fibrillation ([place])   . Complication of anesthesia   . Diabetes mellitus without complication (HCC)    diet controlled  . Dysplastic nevus 06/27/2018   Left upper back. Moderate atypia, limited margins free.   . Eczema   . Randell Patient infection   . Family history of anesthesia complication    Mother - confusion  . Fatty liver   . Heart murmur    Slight - "nothing to worry about"  . Hemorrhoid   . Hypertension   . Hypothyroidism   . PONV (postoperative nausea and vomiting)    "only with c-sections"  . Sleep apnea    mild   Past Surgical History:  Procedure Laterality Date  . CESAREAN SECTION    . CHOLECYSTECTOMY N/A 09/07/2018   Procedure: LAPAROSCOPIC CHOLECYSTECTOMY;  Surgeon: Herbert Pun, MD;  Location: [facility] ORS;  Service: General;  Laterality: N/A;  . FINGER SURGERY     pin to 3rd finger Right hand  . LUMBAR LAMINECTOMY/DECOMPRESSION MICRODISCECTOMY  12/15/2011    Procedure: LUMBAR LAMINECTOMY/DECOMPRESSION MICRODISCECTOMY 1 LEVEL;  Surgeon: Ophelia Charter, MD;  Location: [place] [facility];  Service: Neurosurgery;  Laterality: Right;  RIGHT Lumbar four-Five diskectomy  . LUMBAR LAMINECTOMY/DECOMPRESSION MICRODISCECTOMY N/A 11/04/2019   Procedure: MICRODISCECTOMY LEFT LUMBAR FIVE SACRAL ONE;  Surgeon: Newman Pies, MD;  Location: [place];  Service: Neurosurgery;  Laterality: N/A;  3C  . MICRODISCECTOMY LUMBAR     L4-5  . WISDOM TOOTH EXTRACTION     Family History  Problem Relation Age of Onset  . GER disease Mother   . Diabetes Father   . Cancer Father        Melanoma, Prostate  . Heart disease Father   . Heart disease Maternal Grandfather   . Diabetes Paternal Grandfather   . Breast cancer Sister 35   Social History   Socioeconomic History  . Marital status: Married    Spouse name: Not on file  . Number of children: 2  . Years of education: Not on file  . Highest education level: Not on file  Occupational History    Employer: [employer]  Tobacco Use  . Smoking status: Never Smoker  . Smokeless tobacco: Never Used  Vaping Use  . Vaping Use: Never used  Substance and Sexual Activity  . Alcohol use: Not Currently    Alcohol/week: 0.0 standard drinks  . Drug  use: No  . Sexual activity: Not on file  Other Topics Concern  . Not on file  Social History Narrative   Nurse    Married   Social Determinants of Health   Financial Resource Strain: Not on file  Food Insecurity: Not on file  Transportation Needs: Not on file  Physical Activity: Not on file  Stress: Not on file  Social Connections: Not on file    Outpatient Encounter Medications as of 03/06/2020  Medication Sig  . cyanocobalamin (,VITAMIN B-12,) 1000 MCG/ML injection INJECT 1 ML INTO THE MUSCLE TWICE A MONTH  . Digestive Enzymes (SIMILASE PO) Take 1 tablet by mouth 3 (three) times daily with meals.  Marland Kitchen EPINEPHrine (EPIPEN 2-PAK) 0.3 mg/0.3 mL IJ SOAJ injection  Inject 0.3 mg into the muscle as needed for anaphylaxis.  Marland Kitchen GLUTATHIONE PO Take 7 Pump by mouth daily.   . Methylsulfonylmethane (MSM) 1000 MG TABS Take 1,000 mg by mouth daily.  . metoprolol succinate (TOPROL-XL) 25 MG 24 hr tablet Take 25 mg by mouth daily.  . Nutritional Supplements (DHEA PO) Take 1 capsule by mouth daily.  . Syringe/Needle, Disp, (SYRINGE 3CC/25GX1") 25G X 1" 3 ML MISC Use as directed with b12 injections.  Marland Kitchen thyroid (NP THYROID) 15 MG tablet Take 15 mg by mouth daily.  . Vitamin D-Vitamin K (VITAMIN K2-VITAMIN D3 PO) Take 1 tablet by mouth daily.  . [DISCONTINUED] cyanocobalamin (,VITAMIN B-12,) 1000 MCG/ML injection INJECT 1 ML INTO THE MUSCLE ONCE WEEKLY X 4 WEEKS AND THEN ONCE MONTHLY.  . [DISCONTINUED] Syringe/Needle, Disp, (SYRINGE 3CC/25GX1") 25G X 1" 3 ML MISC Use as directed with b12 injections.   No facility-administered encounter medications on file as of 03/06/2020.    Review of Systems  Constitutional: Negative for appetite change and unexpected weight change.  HENT: Negative for congestion and sinus pressure.   Respiratory: Negative for cough, chest tightness and shortness of breath.   Cardiovascular: Negative for chest pain, palpitations and leg swelling.  Gastrointestinal: Negative for abdominal pain, diarrhea, nausea and vomiting.  Genitourinary: Negative for difficulty urinating and dysuria.  Musculoskeletal: Negative for joint swelling and myalgias.  Skin: Negative for color change and rash.  Neurological: Negative for dizziness, light-headedness and headaches.  Psychiatric/Behavioral: Negative for agitation and dysphoric mood.       Objective:    Physical Exam Vitals reviewed.  Constitutional:      General: She is not in acute distress.    Appearance: Normal appearance.  HENT:     Head: Normocephalic and atraumatic.     Right Ear: External ear normal.     Left Ear: External ear normal.     Mouth/Throat:     Mouth: Oropharynx is clear and  moist.  Eyes:     General: No scleral icterus.       Right eye: No discharge.        Left eye: No discharge.     Conjunctiva/sclera: Conjunctivae normal.  Neck:     Thyroid: No thyromegaly.  Cardiovascular:     Rate and Rhythm: Normal rate and regular rhythm.  Pulmonary:     Effort: No respiratory distress.     Breath sounds: Normal breath sounds. No wheezing.  Abdominal:     General: Bowel sounds are normal.     Palpations: Abdomen is soft.     Tenderness: There is no abdominal tenderness.  Musculoskeletal:        General: No swelling, tenderness or edema.     Cervical back:  Neck supple. No tenderness.  Lymphadenopathy:     Cervical: No cervical adenopathy.  Skin:    Findings: No erythema or rash.  Neurological:     Mental Status: She is alert.  Psychiatric:        Mood and Affect: Mood normal.        Behavior: Behavior normal.     BP 130/78   Pulse 86   Temp 98.2 F (36.8 C) (Oral)   Resp 16   Ht 5' 6"  (1.676 m)   Wt 234 lb 3.2 oz (106.2 kg)   SpO2 98%   BMI 37.80 kg/m  Wt Readings from Last 3 Encounters:  03/06/20 234 lb 3.2 oz (106.2 kg)  01/03/20 224 lb (101.6 kg)  11/04/19 217 lb 2.5 oz (98.5 kg)     Lab Results  Component Value Date   WBC 7.5 03/04/2020   HGB 15.5 (H) 03/04/2020   HCT 46.0 03/04/2020   PLT 211.0 03/04/2020   GLUCOSE 107 (H) 03/04/2020   CHOL 174 03/04/2020   TRIG 72.0 03/04/2020   HDL 67.10 03/04/2020   LDLDIRECT 145.4 03/01/2013   LDLCALC 93 03/04/2020   ALT 24 03/04/2020   AST 21 03/04/2020   NA 138 03/04/2020   K 4.2 03/04/2020   CL 105 03/04/2020   CREATININE 0.66 03/04/2020   BUN 17 03/04/2020   CO2 25 03/04/2020   TSH 4.91 (H) 03/04/2020   HGBA1C 5.3 03/04/2020   MICROALBUR 1.9 10/30/2019    MM 3D SCREEN BREAST BILATERAL  Result Date: 02/13/2020 CLINICAL DATA:  Screening. EXAM: DIGITAL SCREENING BILATERAL MAMMOGRAM WITH TOMO AND CAD COMPARISON:  Previous exam(s). ACR Breast Density Category a: The breast tissue  is almost entirely fatty. FINDINGS: There are no findings suspicious for malignancy. Images were processed with CAD. IMPRESSION: No mammographic evidence of malignancy. A result letter of this screening mammogram will be mailed directly to the patient. RECOMMENDATION: Screening mammogram in one year. (Code:SM-B-01Y) BI-RADS CATEGORY  1: Negative. Electronically Signed   By: Nolon Nations M.D.   On: 02/13/2020 10:06       Assessment & Plan:   Problem List Items Addressed This Visit    B12 deficiency    B12 level 400.  Increase B12 injections to twice monthly.  Follow.        Fatty liver    Diet, exercise and weight loss.  Recent liver panel wnl.       Hyperlipidemia, mixed    Low cholesterol diet and exercise.  Follow lipid panel.       Hypertension    On metoprolol.  Blood pressures as outlined.  Follow pressures.  Follow metabolic panel.       Low back pain    S/p surgery.  Doing well.  Follow.       OSA (obstructive sleep apnea)    Using dental device.  Follow.       Paroxysmal A-fib [facility])    S/p recent ablation.  On eliquis.  Doing well.  Follow.        Type 2 diabetes mellitus with hyperglycemia (HCC)    Low carb diet and exercise.  Follow met b and a1c.           Einar Pheasant, MD

## 2020-03-07 ENCOUNTER — Encounter: Payer: Self-pay | Admitting: Internal Medicine

## 2020-03-07 NOTE — Assessment & Plan Note (Signed)
S/p recent ablation.  On eliquis.  Doing well.  Follow.

## 2020-03-07 NOTE — Assessment & Plan Note (Signed)
Diet, exercise and weight loss.  Recent liver panel wnl.

## 2020-03-07 NOTE — Assessment & Plan Note (Signed)
Using dental device.  Follow.

## 2020-03-07 NOTE — Assessment & Plan Note (Signed)
Low cholesterol diet and exercise.  Follow lipid panel.   

## 2020-03-07 NOTE — Assessment & Plan Note (Signed)
Low carb diet and exercise.  Follow met b and a1c.

## 2020-03-07 NOTE — Assessment & Plan Note (Signed)
On metoprolol.  Blood pressures as outlined.  Follow pressures.  Follow metabolic panel.

## 2020-03-07 NOTE — Assessment & Plan Note (Signed)
B12 level 400.  Increase B12 injections to twice monthly.  Follow.

## 2020-03-07 NOTE — Assessment & Plan Note (Signed)
S/p surgery.  Doing well.  Follow.   

## 2020-03-10 DIAGNOSIS — I10 Essential (primary) hypertension: Secondary | ICD-10-CM | POA: Diagnosis not present

## 2020-03-10 DIAGNOSIS — Z6838 Body mass index (BMI) 38.0-38.9, adult: Secondary | ICD-10-CM | POA: Diagnosis not present

## 2020-03-10 DIAGNOSIS — M5127 Other intervertebral disc displacement, lumbosacral region: Secondary | ICD-10-CM | POA: Diagnosis not present

## 2020-03-16 ENCOUNTER — Telehealth: Payer: Self-pay | Admitting: Internal Medicine

## 2020-03-16 NOTE — Telephone Encounter (Signed)
My chart message sent to pt regarding f/u tsh.

## 2020-03-17 ENCOUNTER — Telehealth: Payer: Self-pay | Admitting: Internal Medicine

## 2020-03-17 NOTE — Telephone Encounter (Signed)
Stacy from Dr. Shirlyn Goltz office called and wanted to do a peer to peer for long-term disability  And had a few clinical questions that she need to have answered in order to fill out the paperwork. Please contact Stacy at [phone]

## 2020-03-24 NOTE — Telephone Encounter (Signed)
Called and LM for [employer]

## 2020-03-25 DIAGNOSIS — I480 Paroxysmal atrial fibrillation: Secondary | ICD-10-CM | POA: Diagnosis not present

## 2020-03-25 DIAGNOSIS — E782 Mixed hyperlipidemia: Secondary | ICD-10-CM | POA: Diagnosis not present

## 2020-03-25 DIAGNOSIS — Z9889 Other specified postprocedural states: Secondary | ICD-10-CM | POA: Diagnosis not present

## 2020-03-25 DIAGNOSIS — G4733 Obstructive sleep apnea (adult) (pediatric): Secondary | ICD-10-CM | POA: Diagnosis not present

## 2020-03-25 NOTE — Telephone Encounter (Signed)
Called Stacy and LM to return my call

## 2020-04-02 DIAGNOSIS — B9711 Coxsackievirus as the cause of diseases classified elsewhere: Secondary | ICD-10-CM | POA: Diagnosis not present

## 2020-04-02 DIAGNOSIS — B2790 Infectious mononucleosis, unspecified without complication: Secondary | ICD-10-CM | POA: Diagnosis not present

## 2020-04-02 DIAGNOSIS — E039 Hypothyroidism, unspecified: Secondary | ICD-10-CM | POA: Diagnosis not present

## 2020-04-02 DIAGNOSIS — E559 Vitamin D deficiency, unspecified: Secondary | ICD-10-CM | POA: Diagnosis not present

## 2020-04-02 DIAGNOSIS — R5383 Other fatigue: Secondary | ICD-10-CM | POA: Diagnosis not present

## 2020-04-02 DIAGNOSIS — Z1589 Genetic susceptibility to other disease: Secondary | ICD-10-CM | POA: Diagnosis not present

## 2020-04-02 DIAGNOSIS — S30861A Insect bite (nonvenomous) of abdominal wall, initial encounter: Secondary | ICD-10-CM | POA: Diagnosis not present

## 2020-04-03 ENCOUNTER — Ambulatory Visit (INDEPENDENT_AMBULATORY_CARE_PROVIDER_SITE_OTHER): Payer: BC Managed Care – PPO | Admitting: Internal Medicine

## 2020-04-03 ENCOUNTER — Other Ambulatory Visit (HOSPITAL_COMMUNITY)
Admission: RE | Admit: 2020-04-03 | Discharge: 2020-04-03 | Disposition: A | Discharge status: Home / Self Care | Payer: BC Managed Care – PPO | Source: Ambulatory Visit | Attending: Internal Medicine | Admitting: Internal Medicine

## 2020-04-03 ENCOUNTER — Other Ambulatory Visit: Payer: Self-pay

## 2020-04-03 VITALS — BP 132/78 | HR 88 | Temp 98.6°F | Resp 16 | Ht 66.0 in | Wt 236.0 lb

## 2020-04-03 DIAGNOSIS — G4733 Obstructive sleep apnea (adult) (pediatric): Secondary | ICD-10-CM | POA: Diagnosis not present

## 2020-04-03 DIAGNOSIS — Z0000 Encounter for general adult medical examination without abnormal findings: Secondary | ICD-10-CM

## 2020-04-03 DIAGNOSIS — I10 Essential (primary) hypertension: Secondary | ICD-10-CM

## 2020-04-03 DIAGNOSIS — E782 Mixed hyperlipidemia: Secondary | ICD-10-CM

## 2020-04-03 DIAGNOSIS — I480 Paroxysmal atrial fibrillation: Secondary | ICD-10-CM

## 2020-04-03 DIAGNOSIS — Z124 Encounter for screening for malignant neoplasm of cervix: Secondary | ICD-10-CM | POA: Diagnosis not present

## 2020-04-03 DIAGNOSIS — R7989 Other specified abnormal findings of blood chemistry: Secondary | ICD-10-CM

## 2020-04-03 DIAGNOSIS — E538 Deficiency of other specified B group vitamins: Secondary | ICD-10-CM

## 2020-04-03 DIAGNOSIS — E1165 Type 2 diabetes mellitus with hyperglycemia: Secondary | ICD-10-CM

## 2020-04-03 DIAGNOSIS — K760 Fatty (change of) liver, not elsewhere classified: Secondary | ICD-10-CM

## 2020-04-03 NOTE — Progress Notes (Signed)
Patient ID: Joy Houston, female   DOB: [birth year], 55 y.o.   MRN: [id number]   Subjective:    Patient ID: Joy Houston, female    DOB: 02-11-1966, 55 y.o.   MRN: [id number]  HPI This visit occurred during the SARS-CoV-2 public health emergency.  Safety protocols were in place, including screening questions prior to the visit, additional usage of staff PPE, and extensive cleaning of exam room while observing appropriate contact time as indicated for disinfecting solutions.  Patient here for her physical exam.  She reports she is doing relatively well. S/p recent ablation for paroxysmal afib.  Has felt better since ablation.  May occasionally notice brief symptoms, but overall much improved.  Energy better.  No chest pain reported.  Breathing appears to be better.  Stress better.  Plans to get back into a routine of watching her diet and exercise.  No acid reflux reported.  No abdominal pain or bowel change reported.  Discussed recent labs.  She is off thyroid medication now.  Discussed rechecking thyroid tests in near future.     Past Medical History:  Diagnosis Date  . Anemia   . Angio-edema   . Atrial fibrillation ([place])   . Complication of anesthesia   . Diabetes mellitus without complication (HCC)    diet controlled  . Dysplastic nevus 06/27/2018   Left upper back. Moderate atypia, limited margins free.   . Eczema   . Randell Patient infection   . Family history of anesthesia complication    Mother - confusion  . Fatty liver   . Heart murmur    Slight - "nothing to worry about"  . Hemorrhoid   . Hypertension   . Hypothyroidism   . PONV (postoperative nausea and vomiting)    "only with c-sections"  . Sleep apnea    mild   Past Surgical History:  Procedure Laterality Date  . CESAREAN SECTION    . CHOLECYSTECTOMY N/A 09/07/2018   Procedure: LAPAROSCOPIC CHOLECYSTECTOMY;  Surgeon: Herbert Pun, MD;  Location: [facility] ORS;  Service: General;  Laterality: N/A;  .  FINGER SURGERY     pin to 3rd finger Right hand  . LUMBAR LAMINECTOMY/DECOMPRESSION MICRODISCECTOMY  12/15/2011   Procedure: LUMBAR LAMINECTOMY/DECOMPRESSION MICRODISCECTOMY 1 LEVEL;  Surgeon: Ophelia Charter, MD;  Location: [facility];  Service: Neurosurgery;  Laterality: Right;  RIGHT Lumbar four-Five diskectomy  . LUMBAR LAMINECTOMY/DECOMPRESSION MICRODISCECTOMY N/A 11/04/2019   Procedure: MICRODISCECTOMY LEFT LUMBAR FIVE SACRAL ONE;  Surgeon: Newman Pies, MD;  Location: [place];  Service: Neurosurgery;  Laterality: N/A;  3C  . MICRODISCECTOMY LUMBAR     L4-5  . WISDOM TOOTH EXTRACTION     Family History  Problem Relation Age of Onset  . GER disease Mother   . Diabetes Father   . Cancer Father        Melanoma, Prostate  . Heart disease Father   . Heart disease Maternal Grandfather   . Diabetes Paternal Grandfather   . Breast cancer Sister 25   Social History   Socioeconomic History  . Marital status: Married    Spouse name: Not on file  . Number of children: 2  . Years of education: Not on file  . Highest education level: Not on file  Occupational History    Employer: [employer]  Tobacco Use  . Smoking status: Never Smoker  . Smokeless tobacco: Never Used  Vaping Use  . Vaping Use: Never used  Substance and Sexual Activity  . Alcohol  use: Not Currently    Alcohol/week: 0.0 standard drinks  . Drug use: No  . Sexual activity: Not on file  Other Topics Concern  . Not on file  Social History Narrative   Nurse    Married   Social Determinants of Health   Financial Resource Strain: Not on file  Food Insecurity: Not on file  Transportation Needs: Not on file  Physical Activity: Not on file  Stress: Not on file  Social Connections: Not on file    Outpatient Encounter Medications as of 04/03/2020  Medication Sig  . cyanocobalamin (,VITAMIN B-12,) 1000 MCG/ML injection INJECT 1 ML INTO THE MUSCLE TWICE A MONTH  . Digestive Enzymes (SIMILASE PO) Take 1  tablet by mouth 3 (three) times daily with meals.  Marland Kitchen EPINEPHrine (EPIPEN 2-PAK) 0.3 mg/0.3 mL IJ SOAJ injection Inject 0.3 mg into the muscle as needed for anaphylaxis.  Marland Kitchen GLUTATHIONE PO Take 7 Pump by mouth daily.   . Methylsulfonylmethane (MSM) 1000 MG TABS Take 1,000 mg by mouth daily.  . metoprolol succinate (TOPROL-XL) 25 MG 24 hr tablet Take 25 mg by mouth daily.  . Nutritional Supplements (DHEA PO) Take 1 capsule by mouth daily.  . Syringe/Needle, Disp, (SYRINGE 3CC/25GX1") 25G X 1" 3 ML MISC Use as directed with b12 injections.  Marland Kitchen thyroid (NP THYROID) 15 MG tablet Take 15 mg by mouth daily.  . Vitamin D-Vitamin K (VITAMIN K2-VITAMIN D3 PO) Take 1 tablet by mouth daily.   No facility-administered encounter medications on file as of 04/03/2020.    Review of Systems  Constitutional: Negative for appetite change and unexpected weight change.  HENT: Negative for congestion, sinus pressure and sore throat.   Eyes: Negative for pain and visual disturbance.  Respiratory: Negative for cough, chest tightness and shortness of breath.   Cardiovascular: Negative for chest pain, palpitations and leg swelling.  Gastrointestinal: Negative for abdominal pain, diarrhea, nausea and vomiting.  Genitourinary: Negative for difficulty urinating and dysuria.  Musculoskeletal: Negative for joint swelling and myalgias.  Skin: Negative for color change and rash.  Neurological: Negative for dizziness, light-headedness and headaches.  Hematological: Negative for adenopathy. Does not bruise/bleed easily.  Psychiatric/Behavioral: Negative for agitation and dysphoric mood.       Objective:    Physical Exam Vitals reviewed.  Constitutional:      General: She is not in acute distress.    Appearance: Normal appearance. She is well-developed and well-nourished.  HENT:     Head: Normocephalic and atraumatic.     Right Ear: External ear normal.     Left Ear: External ear normal.     Mouth/Throat:      Mouth: Oropharynx is clear and moist.  Eyes:     General: No scleral icterus.       Right eye: No discharge.        Left eye: No discharge.     Conjunctiva/sclera: Conjunctivae normal.  Neck:     Thyroid: No thyromegaly.  Cardiovascular:     Rate and Rhythm: Normal rate and regular rhythm.  Pulmonary:     Effort: No tachypnea, accessory muscle usage or respiratory distress.     Breath sounds: Normal breath sounds. No decreased breath sounds or wheezing.  Chest:  Breasts:     Right: No inverted nipple, mass, nipple discharge or tenderness (no axillary adenopathy).     Left: No inverted nipple, mass, nipple discharge or tenderness (no axilarry adenopathy).    Abdominal:     General: Bowel sounds  are normal.     Palpations: Abdomen is soft.     Tenderness: There is no abdominal tenderness.  Genitourinary:    Comments: Normal external genitalia.  Vaginal vault without lesions.  Cervix identified.  Pap smear performed.  Could not appreciate any adnexal masses or tenderness.   Musculoskeletal:        General: No swelling, tenderness or edema.     Cervical back: Neck supple. No tenderness.  Lymphadenopathy:     Cervical: No cervical adenopathy.  Skin:    Findings: No erythema or rash.  Neurological:     Mental Status: She is alert and oriented to person, place, and time.  Psychiatric:        Mood and Affect: Mood and affect and mood normal.        Behavior: Behavior normal.     BP 132/78   Pulse 88   Temp 98.6 F (37 C) (Oral)   Resp 16   Ht 5' 6"  (1.676 m)   Wt 236 lb (107 kg)   SpO2 98%   BMI 38.09 kg/m  Wt Readings from Last 3 Encounters:  04/03/20 236 lb (107 kg)  03/06/20 234 lb 3.2 oz (106.2 kg)  01/03/20 224 lb (101.6 kg)     Lab Results  Component Value Date   WBC 7.5 03/04/2020   HGB 15.5 (H) 03/04/2020   HCT 46.0 03/04/2020   PLT 211.0 03/04/2020   GLUCOSE 107 (H) 03/04/2020   CHOL 174 03/04/2020   TRIG 72.0 03/04/2020   HDL 67.10 03/04/2020    LDLDIRECT 145.4 03/01/2013   LDLCALC 93 03/04/2020   ALT 24 03/04/2020   AST 21 03/04/2020   NA 138 03/04/2020   K 4.2 03/04/2020   CL 105 03/04/2020   CREATININE 0.66 03/04/2020   BUN 17 03/04/2020   CO2 25 03/04/2020   TSH 4.91 (H) 03/04/2020   HGBA1C 5.3 03/04/2020   MICROALBUR 1.9 10/30/2019    MM 3D SCREEN BREAST BILATERAL  Result Date: 02/13/2020 CLINICAL DATA:  Screening. EXAM: DIGITAL SCREENING BILATERAL MAMMOGRAM WITH TOMO AND CAD COMPARISON:  Previous exam(s). ACR Breast Density Category a: The breast tissue is almost entirely fatty. FINDINGS: There are no findings suspicious for malignancy. Images were processed with CAD. IMPRESSION: No mammographic evidence of malignancy. A result letter of this screening mammogram will be mailed directly to the patient. RECOMMENDATION: Screening mammogram in one year. (Code:SM-B-01Y) BI-RADS CATEGORY  1: Negative. Electronically Signed   By: Nolon Nations M.D.   On: 02/13/2020 10:06       Assessment & Plan:   Problem List Items Addressed This Visit    Abnormal TSH    Followed by integrative physician.  Was started on thyroid medication by her.  She is off now. Plan for recheck tsh in the next 2-3 weeks.  Discussed with her today. She is comfortable with this plan.        Relevant Orders   TSH   T4, free   B12 deficiency    Continue B12 injections.       Fatty liver    Diet, exercise and weight loss.  Follow liver function tests.   Lab Results  Component Value Date   ALT 24 03/04/2020   AST 21 03/04/2020   ALKPHOS 84 03/04/2020   BILITOT 0.5 03/04/2020        Health care maintenance    Physical today 04/03/20.  PAP 10//16/20.  Repeat pap today per pt request. Mammogram 02/13/20-Birads I. Will  need f/u colonoscopy.        Hyperlipidemia, mixed    Low cholesterol diet and exercise.  Follow lipid panel.       Hypertension    Blood pressure as outlined.  Continue metoprolol.  Follow pressures.  Follow metabolic panel.        OSA (obstructive sleep apnea)    Using a dental device.  Feels has helped.        Paroxysmal A-fib [facility])    S/p recent ablation.  Has done well since her ablation. Per cardiology note, on xarelto.  Continue metoprolol.  Follow.       Type 2 diabetes mellitus with hyperglycemia (HCC)    Low carb diet and exercise.  Discussed.  Follow met b and a1c.        Other Visit Diagnoses    Cervical cancer screening    -  Primary   Relevant Orders   Cytology - PAP( [place])   Routine general medical examination at a health care facility           Einar Pheasant, MD

## 2020-04-03 NOTE — Assessment & Plan Note (Addendum)
Physical today 04/03/20.  PAP 10//16/20.  Repeat pap today per pt request. Mammogram 02/13/20-Birads I. Will need f/u colonoscopy.

## 2020-04-05 ENCOUNTER — Encounter: Payer: Self-pay | Admitting: Internal Medicine

## 2020-04-05 DIAGNOSIS — R7989 Other specified abnormal findings of blood chemistry: Secondary | ICD-10-CM | POA: Insufficient documentation

## 2020-04-05 NOTE — Assessment & Plan Note (Signed)
Blood pressure as outlined.  Continue metoprolol.  Follow pressures.  Follow metabolic panel.  

## 2020-04-05 NOTE — Assessment & Plan Note (Signed)
Low cholesterol diet and exercise.  Follow lipid panel.   

## 2020-04-05 NOTE — Assessment & Plan Note (Signed)
S/p recent ablation.  Has done well since her ablation. Per cardiology note, on xarelto.  Continue metoprolol.  Follow.

## 2020-04-05 NOTE — Assessment & Plan Note (Signed)
Using a dental device.  Feels has helped.

## 2020-04-05 NOTE — Assessment & Plan Note (Signed)
Diet, exercise and weight loss.  Follow liver function tests.   Lab Results  Component Value Date   ALT 24 03/04/2020   AST 21 03/04/2020   ALKPHOS 84 03/04/2020   BILITOT 0.5 03/04/2020

## 2020-04-05 NOTE — Assessment & Plan Note (Signed)
Continue B12 injections.   

## 2020-04-05 NOTE — Assessment & Plan Note (Signed)
Followed by integrative physician.  Was started on thyroid medication by her.  She is off now. Plan for recheck tsh in the next 2-3 weeks.  Discussed with her today. She is comfortable with this plan.

## 2020-04-05 NOTE — Assessment & Plan Note (Signed)
Low carb diet and exercise.  Discussed.  Follow met b and a1c.

## 2020-04-08 LAB — CYTOLOGY - PAP
Comment: NEGATIVE
Diagnosis: NEGATIVE
High risk HPV: NEGATIVE

## 2020-04-17 ENCOUNTER — Other Ambulatory Visit (INDEPENDENT_AMBULATORY_CARE_PROVIDER_SITE_OTHER): Payer: BC Managed Care – PPO

## 2020-04-17 ENCOUNTER — Other Ambulatory Visit: Payer: Self-pay

## 2020-04-17 DIAGNOSIS — R7989 Other specified abnormal findings of blood chemistry: Secondary | ICD-10-CM | POA: Diagnosis not present

## 2020-04-17 DIAGNOSIS — I480 Paroxysmal atrial fibrillation: Secondary | ICD-10-CM | POA: Diagnosis not present

## 2020-04-17 LAB — TSH: TSH: 4.41 u[IU]/mL (ref 0.35–4.50)

## 2020-04-17 LAB — T4, FREE: Free T4: 0.79 ng/dL (ref 0.60–1.60)

## 2020-05-12 ENCOUNTER — Telehealth (INDEPENDENT_AMBULATORY_CARE_PROVIDER_SITE_OTHER): Payer: BC Managed Care – PPO | Admitting: Internal Medicine

## 2020-05-12 ENCOUNTER — Encounter: Payer: Self-pay | Admitting: Internal Medicine

## 2020-05-12 ENCOUNTER — Telehealth: Payer: Self-pay

## 2020-05-12 DIAGNOSIS — I480 Paroxysmal atrial fibrillation: Secondary | ICD-10-CM | POA: Diagnosis not present

## 2020-05-12 DIAGNOSIS — R0981 Nasal congestion: Secondary | ICD-10-CM | POA: Diagnosis not present

## 2020-05-12 DIAGNOSIS — E1165 Type 2 diabetes mellitus with hyperglycemia: Secondary | ICD-10-CM | POA: Diagnosis not present

## 2020-05-12 NOTE — Progress Notes (Signed)
Patient ID: Joy Houston, female   DOB: Apr 14, 1965, 55 y.o.   MRN: [id number]   Virtual Visit via video Note  This visit type was conducted due to national recommendations for restrictions regarding the COVID-19 pandemic (e.g. social distancing).  This format is felt to be most appropriate for this patient at this time.  All issues noted in this document were discussed and addressed.  No physical exam was performed (except for noted visual exam findings with Video Visits).   I connected with Joy Houston today by a video enabled telemedicine application and verified that I am speaking with the correct person using two identifiers. Location patient: home Location provider: work  Persons participating in the virtual visit: patient, provider  The limitations, risks, security and privacy concerns of performing an evaluation and management service by video and the availability of in person appointments have been discussed.  It has also been discussed with the patient that there may be a patient responsible charge related to this service. The patient expressed understanding and agreed to proceed.   Reason for visit: work in appt  HPI: Work in with concerns regarding increased congestion, sore throat and some cough.  Husband was sick - starting Tuesday 05/05/20. He had sore throat, headache, cough and runny nose.  He is doing better.  Husband was exposed to a coworker with covid.  [name] started having symptoms Saturday 05/09/20.  Developed sore throat, head pressure, runny nose and eyes watering.  She has had some sneezing.  Has developed some cough - mostly non productive.  Has noticed minimal yellow congestion.  No nausea or vomiting.  Eating and drinking.  No loss of taste or smell.  States with congestion, food may taste a little different.  No chest pain or sob.  No bowel change with current infection.     ROS: See pertinent positives and negatives per HPI.  Past Medical History:   Diagnosis Date  . Anemia   . Angio-edema   . Atrial fibrillation ([place])   . Complication of anesthesia   . Diabetes mellitus without complication (HCC)    diet controlled  . Dysplastic nevus 06/27/2018   Left upper back. Moderate atypia, limited margins free.   . Eczema   . Randell Patient infection   . Family history of anesthesia complication    Mother - confusion  . Fatty liver   . Heart murmur    Slight - "nothing to worry about"  . Hemorrhoid   . Hypertension   . Hypothyroidism   . PONV (postoperative nausea and vomiting)    "only with c-sections"  . Sleep apnea    mild    Past Surgical History:  Procedure Laterality Date  . CESAREAN SECTION    . CHOLECYSTECTOMY N/A 09/07/2018   Procedure: LAPAROSCOPIC CHOLECYSTECTOMY;  Surgeon: Herbert Pun, MD;  Location: [facility] ORS;  Service: General;  Laterality: N/A;  . FINGER SURGERY     pin to 3rd finger Right hand  . LUMBAR LAMINECTOMY/DECOMPRESSION MICRODISCECTOMY  12/15/2011   Procedure: LUMBAR LAMINECTOMY/DECOMPRESSION MICRODISCECTOMY 1 LEVEL;  Surgeon: Ophelia Charter, MD;  Location: [facility];  Service: Neurosurgery;  Laterality: Right;  RIGHT Lumbar four-Five diskectomy  . LUMBAR LAMINECTOMY/DECOMPRESSION MICRODISCECTOMY N/A 11/04/2019   Procedure: MICRODISCECTOMY LEFT LUMBAR FIVE SACRAL ONE;  Surgeon: Newman Pies, MD;  Location: [facility];  Service: Neurosurgery;  Laterality: N/A;  3C  . MICRODISCECTOMY LUMBAR     L4-5  . WISDOM TOOTH EXTRACTION      Family History  Problem Relation Age of Onset  . GER disease Mother   . Diabetes Father   . Cancer Father        Melanoma, Prostate  . Heart disease Father   . Heart disease Maternal Grandfather   . Diabetes Paternal Grandfather   . Breast cancer Sister 19    SOCIAL HX: reviewed.    Current Outpatient Medications:  .  azithromycin (ZITHROMAX) 250 MG tablet, Take 2 tablets x 1 day and then one tablet per day for four more days., Disp: 6 tablet, Rfl:  0 .  cyanocobalamin (,VITAMIN B-12,) 1000 MCG/ML injection, INJECT 1 ML INTO THE MUSCLE TWICE A MONTH, Disp: 12 mL, Rfl: 1 .  Digestive Enzymes (SIMILASE PO), Take 1 tablet by mouth 3 (three) times daily with meals., Disp: , Rfl:  .  EPINEPHrine (EPIPEN 2-PAK) 0.3 mg/0.3 mL IJ SOAJ injection, Inject 0.3 mg into the muscle as needed for anaphylaxis., Disp: 2 each, Rfl: 0 .  GLUTATHIONE PO, Take 7 Pump by mouth daily. , Disp: , Rfl:  .  Methylsulfonylmethane (MSM) 1000 MG TABS, Take 1,000 mg by mouth daily., Disp: , Rfl:  .  metoprolol succinate (TOPROL-XL) 25 MG 24 hr tablet, Take 25 mg by mouth daily., Disp: , Rfl:  .  Nutritional Supplements (DHEA PO), Take 1 capsule by mouth daily., Disp: , Rfl:  .  Syringe/Needle, Disp, (SYRINGE 3CC/25GX1") 25G X 1" 3 ML MISC, Use as directed with b12 injections., Disp: 50 each, Rfl: 2 .  thyroid (NP THYROID) 15 MG tablet, Take 15 mg by mouth daily., Disp: , Rfl:  .  Vitamin D-Vitamin K (VITAMIN K2-VITAMIN D3 PO), Take 1 tablet by mouth daily., Disp: , Rfl:   EXAM:  GENERAL: alert, oriented, appears well and in no acute distress  HEENT: atraumatic, conjunttiva clear, no obvious abnormalities on inspection of external nose and ears  NECK: normal movements of the head and neck  LUNGS: on inspection no signs of respiratory distress, breathing rate appears normal, no obvious gross SOB, gasping or wheezing  CV: no obvious cyanosis  PSYCH/NEURO: pleasant and cooperative, no obvious depression or anxiety, speech and thought processing grossly intact  ASSESSMENT AND PLAN:  Discussed the following assessment and plan:  Problem List Items Addressed This Visit    Nasal congestion    Increased nasal congestion, head congestion and minimal cough.  Possible covid exposure.  Schedule for nasal swab.  Discussed quarantine.  Treat with nasacort nasal spray and saline nasal spray as directed.  Robitussin DM as directed.  rx given for zpak to have if symptoms  progress.  Rest.  Fluids.  Call with update.        Relevant Orders   Novel Coronavirus, NAA ([facility])   Paroxysmal A-fib ([place])    S/p ablation.  Off xarelto.  Stable.  Has done well since the ablation.  Follow.       Type 2 diabetes mellitus with hyperglycemia ([place])    She is working on diet adjustment.  Has been walking and exercising more.  Follow met b and a1c.           I discussed the assessment and treatment plan with the patient. The patient was provided an opportunity to ask questions and all were answered. The patient agreed with the plan and demonstrated an understanding of the instructions.   The patient was advised to call back or seek an in-person evaluation if the symptoms worsen or if the condition fails to improve as anticipated.  Metha Kolasa, MD  

## 2020-05-12 NOTE — Telephone Encounter (Signed)
Pt called and wants an appt with Dr Nicki Reaper for a head cold. There are no avail appts at the time of call with Dr Nicki Reaper. I offered her a VV appt with Dr Kelly Services for 05/13/20 but she states that she wants Dr Nicki Reaper to make that decision for her. Please advise.

## 2020-05-12 NOTE — Telephone Encounter (Signed)
Appt scheduled

## 2020-05-13 ENCOUNTER — Encounter: Payer: Self-pay | Admitting: Internal Medicine

## 2020-05-13 ENCOUNTER — Other Ambulatory Visit: Payer: BC Managed Care – PPO

## 2020-05-13 DIAGNOSIS — R0981 Nasal congestion: Secondary | ICD-10-CM

## 2020-05-13 MED ORDER — AZITHROMYCIN 250 MG PO TABS: ORAL_TABLET | ORAL | 0 refills | Status: DC

## 2020-05-13 NOTE — Assessment & Plan Note (Signed)
Increased nasal congestion, head congestion and minimal cough.  Possible covid exposure.  Schedule for nasal swab.  Discussed quarantine.  Treat with nasacort nasal spray and saline nasal spray as directed.  Robitussin DM as directed.  rx given for zpak to have if symptoms progress.  Rest.  Fluids.  Call with update.

## 2020-05-13 NOTE — Assessment & Plan Note (Signed)
She is working on diet adjustment.  Has been walking and exercising more.  Follow met b and a1c.

## 2020-05-13 NOTE — Assessment & Plan Note (Signed)
S/p ablation.  Off xarelto.  Stable.  Has done well since the ablation.  Follow.

## 2020-05-14 LAB — SARS-COV-2, NAA 2 DAY TAT

## 2020-05-14 LAB — NOVEL CORONAVIRUS, NAA: SARS-CoV-2, NAA: NOT DETECTED

## 2020-06-01 ENCOUNTER — Encounter: Payer: Self-pay | Admitting: Internal Medicine

## 2020-06-01 ENCOUNTER — Ambulatory Visit: Payer: BC Managed Care – PPO | Admitting: Internal Medicine

## 2020-06-01 ENCOUNTER — Other Ambulatory Visit: Payer: Self-pay

## 2020-06-01 DIAGNOSIS — E782 Mixed hyperlipidemia: Secondary | ICD-10-CM

## 2020-06-01 DIAGNOSIS — K760 Fatty (change of) liver, not elsewhere classified: Secondary | ICD-10-CM | POA: Diagnosis not present

## 2020-06-01 DIAGNOSIS — E1165 Type 2 diabetes mellitus with hyperglycemia: Secondary | ICD-10-CM

## 2020-06-01 DIAGNOSIS — I10 Essential (primary) hypertension: Secondary | ICD-10-CM | POA: Diagnosis not present

## 2020-06-01 DIAGNOSIS — I480 Paroxysmal atrial fibrillation: Secondary | ICD-10-CM | POA: Diagnosis not present

## 2020-06-01 DIAGNOSIS — E538 Deficiency of other specified B group vitamins: Secondary | ICD-10-CM

## 2020-06-01 DIAGNOSIS — G4733 Obstructive sleep apnea (adult) (pediatric): Secondary | ICD-10-CM

## 2020-06-01 NOTE — Progress Notes (Signed)
Patient ID: Joy Houston, female   DOB: 1965-03-09, 55 y.o.   MRN: [id number]   Subjective:    Patient ID: Joy Houston, female    DOB: 02/06/1966, 55 y.o.   MRN: [id number]  HPI This visit occurred during the SARS-CoV-2 public health emergency.  Safety protocols were in place, including screening questions prior to the visit, additional usage of staff PPE, and extensive cleaning of exam room while observing appropriate contact time as indicated for disinfecting solutions.  Patient here for a scheduled follow up. She is s/p ablation for her afib/aflutter 02/19/20.  Taking metoprolol.  Has done well since her ablation.  She feels better.  Breathing is better.  No cough or congestion.  Still will occasionally notice fluttering. States may have brief episodes several times per week.  Overall feels better.  She is more active.  Walking her dog.  Using dental device to sleep - to treat sleep apnea.  Is interested in f/u with Dr Lanney Gins for reevaluation.  No acid reflux reported.  No abdominal pain.  Bowels moving.  Back stable.    Past Medical History:  Diagnosis Date  . Anemia   . Angio-edema   . Atrial fibrillation ([place])   . Complication of anesthesia   . Diabetes mellitus without complication (HCC)    diet controlled  . Dysplastic nevus 06/27/2018   Left upper back. Moderate atypia, limited margins free.   . Eczema   . Randell Patient infection   . Family history of anesthesia complication    Mother - confusion  . Fatty liver   . Heart murmur    Slight - "nothing to worry about"  . Hemorrhoid   . Hypertension   . Hypothyroidism   . PONV (postoperative nausea and vomiting)    "only with c-sections"  . Sleep apnea    mild   Past Surgical History:  Procedure Laterality Date  . CESAREAN SECTION    . CHOLECYSTECTOMY N/A 09/07/2018   Procedure: LAPAROSCOPIC CHOLECYSTECTOMY;  Surgeon: Herbert Pun, MD;  Location: [facility] ORS;  Service: General;  Laterality: N/A;  . FINGER  SURGERY     pin to 3rd finger Right hand  . LUMBAR LAMINECTOMY/DECOMPRESSION MICRODISCECTOMY  12/15/2011   Procedure: LUMBAR LAMINECTOMY/DECOMPRESSION MICRODISCECTOMY 1 LEVEL;  Surgeon: Ophelia Charter, MD;  Location: [place] [facility];  Service: Neurosurgery;  Laterality: Right;  RIGHT Lumbar four-Five diskectomy  . LUMBAR LAMINECTOMY/DECOMPRESSION MICRODISCECTOMY N/A 11/04/2019   Procedure: MICRODISCECTOMY LEFT LUMBAR FIVE SACRAL ONE;  Surgeon: Newman Pies, MD;  Location: [place];  Service: Neurosurgery;  Laterality: N/A;  3C  . MICRODISCECTOMY LUMBAR     L4-5  . WISDOM TOOTH EXTRACTION     Family History  Problem Relation Age of Onset  . GER disease Mother   . Diabetes Father   . Cancer Father        Melanoma, Prostate  . Heart disease Father   . Heart disease Maternal Grandfather   . Diabetes Paternal Grandfather   . Breast cancer Sister 51   Social History   Socioeconomic History  . Marital status: Married    Spouse name: Not on file  . Number of children: 2  . Years of education: Not on file  . Highest education level: Not on file  Occupational History    Employer: [employer]  Tobacco Use  . Smoking status: Never Smoker  . Smokeless tobacco: Never Used  Vaping Use  . Vaping Use: Never used  Substance and Sexual Activity  .  Alcohol use: Not Currently    Alcohol/week: 0.0 standard drinks  . Drug use: No  . Sexual activity: Not on file  Other Topics Concern  . Not on file  Social History Narrative   Nurse    Married   Social Determinants of Health   Financial Resource Strain: Not on file  Food Insecurity: Not on file  Transportation Needs: Not on file  Physical Activity: Not on file  Stress: Not on file  Social Connections: Not on file    Outpatient Encounter Medications as of 06/01/2020  Medication Sig  . cyanocobalamin (,VITAMIN B-12,) 1000 MCG/ML injection INJECT 1 ML INTO THE MUSCLE TWICE A MONTH  . Digestive Enzymes (SIMILASE PO) Take 1 tablet  by mouth 3 (three) times daily with meals.  Marland Kitchen EPINEPHrine (EPIPEN 2-PAK) 0.3 mg/0.3 mL IJ SOAJ injection Inject 0.3 mg into the muscle as needed for anaphylaxis.  . metoprolol succinate (TOPROL-XL) 25 MG 24 hr tablet Take 25 mg by mouth daily.  . Syringe/Needle, Disp, (SYRINGE 3CC/25GX1") 25G X 1" 3 ML MISC Use as directed with b12 injections.  . Vitamin D-Vitamin K (VITAMIN K2-VITAMIN D3 PO) Take 1 tablet by mouth daily.  . [DISCONTINUED] azithromycin (ZITHROMAX) 250 MG tablet Take 2 tablets x 1 day and then one tablet per day for four more days.  . [DISCONTINUED] GLUTATHIONE PO Take 7 Pump by mouth daily.   . [DISCONTINUED] Methylsulfonylmethane (MSM) 1000 MG TABS Take 1,000 mg by mouth daily.  . [DISCONTINUED] Nutritional Supplements (DHEA PO) Take 1 capsule by mouth daily.  . [DISCONTINUED] thyroid (NP THYROID) 15 MG tablet Take 15 mg by mouth daily.   No facility-administered encounter medications on file as of 06/01/2020.    Review of Systems  Constitutional: Negative for appetite change and unexpected weight change.  HENT: Negative for congestion and sinus pressure.   Respiratory: Negative for cough, chest tightness and shortness of breath.   Cardiovascular: Positive for palpitations. Negative for chest pain and leg swelling.  Gastrointestinal: Negative for abdominal pain, diarrhea, nausea and vomiting.  Genitourinary: Negative for difficulty urinating and dysuria.  Musculoskeletal: Negative for joint swelling and myalgias.  Skin: Negative for color change and rash.  Neurological: Negative for dizziness, light-headedness and headaches.  Psychiatric/Behavioral: Negative for agitation and dysphoric mood.       Objective:    Physical Exam Vitals reviewed.  Constitutional:      General: She is not in acute distress.    Appearance: Normal appearance.  HENT:     Head: Normocephalic and atraumatic.     Right Ear: External ear normal.     Left Ear: External ear normal.  Eyes:      General: No scleral icterus.       Right eye: No discharge.        Left eye: No discharge.     Conjunctiva/sclera: Conjunctivae normal.  Neck:     Thyroid: No thyromegaly.  Cardiovascular:     Rate and Rhythm: Normal rate and regular rhythm.  Pulmonary:     Effort: No respiratory distress.     Breath sounds: Normal breath sounds. No wheezing.  Abdominal:     General: Bowel sounds are normal.     Palpations: Abdomen is soft.     Tenderness: There is no abdominal tenderness.  Musculoskeletal:        General: No swelling or tenderness.     Cervical back: Neck supple. No tenderness.  Lymphadenopathy:     Cervical: No cervical adenopathy.  Skin:    Findings: No erythema or rash.  Neurological:     Mental Status: She is alert.  Psychiatric:        Mood and Affect: Mood normal.        Behavior: Behavior normal.     BP 122/78   Pulse 78   Temp 97.8 F (36.6 C) (Oral)   Resp 16   Ht 5' 6"  (1.676 m)   Wt 238 lb (108 kg)   SpO2 98%   BMI 38.41 kg/m  Wt Readings from Last 3 Encounters:  06/01/20 238 lb (108 kg)  05/12/20 232 lb (105.2 kg)  04/03/20 236 lb (107 kg)     Lab Results  Component Value Date   WBC 7.5 03/04/2020   HGB 15.5 (H) 03/04/2020   HCT 46.0 03/04/2020   PLT 211.0 03/04/2020   GLUCOSE 107 (H) 03/04/2020   CHOL 174 03/04/2020   TRIG 72.0 03/04/2020   HDL 67.10 03/04/2020   LDLDIRECT 145.4 03/01/2013   LDLCALC 93 03/04/2020   ALT 24 03/04/2020   AST 21 03/04/2020   NA 138 03/04/2020   K 4.2 03/04/2020   CL 105 03/04/2020   CREATININE 0.66 03/04/2020   BUN 17 03/04/2020   CO2 25 03/04/2020   TSH 4.41 04/17/2020   HGBA1C 5.3 03/04/2020   MICROALBUR 1.9 10/30/2019       Assessment & Plan:   Problem List Items Addressed This Visit    B12 deficiency    Continues b12 injections.  Follow.       Fatty liver    Diet, exercise and weight loss.  Follow liver panel.       Hyperlipidemia, mixed    Low cholesterol diet and exercise.  Follow  lipid panel.       Hypertension    Continue metoprolol.  Blood pressure as outlined.  Follow pressures.  Follow metabolic panel.       OSA (obstructive sleep apnea)    Using a dental device.  Desires f/u with Dr Joanell Rising for reevaluation.       Paroxysmal A-fib [facility])    S/p ablation.  Off xarelto.  Is feeling better.  Discussed persistent brief episodes as outlined.  Discussed anticoagulation, further cardiac f/u.  Feels better.  Wants to monitor.       Type 2 diabetes mellitus with hyperglycemia (HCC)    Low carb diet and exercise given elevated sugars.  Follow met b and a1c.           Einar Pheasant, MD

## 2020-06-08 ENCOUNTER — Encounter: Payer: Self-pay | Admitting: Internal Medicine

## 2020-06-08 NOTE — Assessment & Plan Note (Signed)
Continue metoprolol.  Blood pressure as outlined.  Follow pressures.  Follow metabolic panel.   

## 2020-06-08 NOTE — Assessment & Plan Note (Signed)
Continues b12 injections.  Follow.

## 2020-06-08 NOTE — Assessment & Plan Note (Signed)
Low carb diet and exercise given elevated sugars.  Follow met b and a1c.  ?

## 2020-06-08 NOTE — Assessment & Plan Note (Signed)
Low cholesterol diet and exercise.  Follow lipid panel.   

## 2020-06-08 NOTE — Assessment & Plan Note (Signed)
S/p ablation.  Off xarelto.  Is feeling better.  Discussed persistent brief episodes as outlined.  Discussed anticoagulation, further cardiac f/u.  Feels better.  Wants to monitor.

## 2020-06-08 NOTE — Assessment & Plan Note (Signed)
Diet, exercise and weight loss.  Follow liver panel.

## 2020-06-08 NOTE — Assessment & Plan Note (Signed)
Using a dental device.  Desires f/u with Dr Joanell Rising for reevaluation.

## 2020-06-27 ENCOUNTER — Encounter: Payer: Self-pay | Admitting: Internal Medicine

## 2020-06-27 ENCOUNTER — Other Ambulatory Visit: Payer: Self-pay | Admitting: Internal Medicine

## 2020-06-29 ENCOUNTER — Other Ambulatory Visit: Payer: Self-pay

## 2020-06-29 ENCOUNTER — Encounter: Payer: Self-pay | Admitting: Internal Medicine

## 2020-06-29 ENCOUNTER — Telehealth (INDEPENDENT_AMBULATORY_CARE_PROVIDER_SITE_OTHER): Payer: BC Managed Care – PPO | Admitting: Internal Medicine

## 2020-06-29 DIAGNOSIS — U071 COVID-19: Secondary | ICD-10-CM | POA: Diagnosis not present

## 2020-06-29 DIAGNOSIS — I480 Paroxysmal atrial fibrillation: Secondary | ICD-10-CM

## 2020-06-29 DIAGNOSIS — I10 Essential (primary) hypertension: Secondary | ICD-10-CM

## 2020-06-29 DIAGNOSIS — R197 Diarrhea, unspecified: Secondary | ICD-10-CM | POA: Diagnosis not present

## 2020-06-29 NOTE — Telephone Encounter (Signed)
Work in 3:30 today.

## 2020-06-29 NOTE — Progress Notes (Signed)
Patient tested positive for Covid this past Saturday.Symptoms started Wednesday Took a rapid test. Patient unsure of where she got Covid as she volunteered at a church concert.   Symptoms include runny nose, fatigue, chills, fever, sweats, diarrhea, headache, cough (dry), dizziness, insomnia.

## 2020-06-29 NOTE — Progress Notes (Signed)
Patient ID: Joy Houston, female   DOB: Jun 25, 1965, 55 y.o.   MRN: [id number]   Virtual Visit via video Note  This visit type was conducted due to national recommendations for restrictions regarding the COVID-19 pandemic (e.g. social distancing).  This format is felt to be most appropriate for this patient at this time.  All issues noted in this document were discussed and addressed.  No physical exam was performed (except for noted visual exam findings with Video Visits).   I connected with Truman Hayward by a video enabled telemedicine application and verified that I am speaking with the correct person using two identifiers. Location patient: home Location provider: work  Persons participating in the virtual visit: patient, provider  The limitations, risks, security and privacy concerns of performing an evaluation and management service by video and the availability of in person appointments have been discussed.  It has also been discussed with the patient that there may be a patient responsible charge related to this service. The patient expressed understanding and agreed to proceed.   Reason for visit: work in appt  HPI: Work in:  covid positive.  Reports symptoms started Wednesday 06/24/20.  Had runny nose Wednesday.  Increased fatigue and headache.  Symptoms progressed.  Felt cold and hot.  Woke - bed wet.  Noticed chills.  Taking ibuprofen prn.  Developed cough - mainly non productive.  Symptoms persistent - tested Saturday 06/27/20 - covid positive.  Intermittent "random pain".  Noticed thoracic back pain.  Previous brief left upper chest pain.  No pain now.  No chest tightness or sob.  No sore throat. Minimal drainage.  No nausea or vomiting.  Eating and drinking.  Does report diarrhea.     ROS: See pertinent positives and negatives per HPI.  Past Medical History:  Diagnosis Date  . Anemia   . Angio-edema   . Atrial fibrillation ([place])   . Complication of anesthesia   . Diabetes  mellitus without complication (HCC)    diet controlled  . Dysplastic nevus 06/27/2018   Left upper back. Moderate atypia, limited margins free.   . Eczema   . Randell Patient infection   . Family history of anesthesia complication    Mother - confusion  . Fatty liver   . Heart murmur    Slight - "nothing to worry about"  . Hemorrhoid   . Hypertension   . Hypothyroidism   . PONV (postoperative nausea and vomiting)    "only with c-sections"  . Sleep apnea    mild    Past Surgical History:  Procedure Laterality Date  . CESAREAN SECTION    . CHOLECYSTECTOMY N/A 09/07/2018   Procedure: LAPAROSCOPIC CHOLECYSTECTOMY;  Surgeon: Herbert Pun, MD;  Location: [facility] ORS;  Service: General;  Laterality: N/A;  . FINGER SURGERY     pin to 3rd finger Right hand  . LUMBAR LAMINECTOMY/DECOMPRESSION MICRODISCECTOMY  12/15/2011   Procedure: LUMBAR LAMINECTOMY/DECOMPRESSION MICRODISCECTOMY 1 LEVEL;  Surgeon: Ophelia Charter, MD;  Location: [place] NEURO ORS;  Service: Neurosurgery;  Laterality: Right;  RIGHT Lumbar four-Five diskectomy  . LUMBAR LAMINECTOMY/DECOMPRESSION MICRODISCECTOMY N/A 11/04/2019   Procedure: MICRODISCECTOMY LEFT LUMBAR FIVE SACRAL ONE;  Surgeon: Newman Pies, MD;  Location: [place];  Service: Neurosurgery;  Laterality: N/A;  3C  . MICRODISCECTOMY LUMBAR     L4-5  . WISDOM TOOTH EXTRACTION      Family History  Problem Relation Age of Onset  . GER disease Mother   . Diabetes Father   .  Cancer Father        Melanoma, Prostate  . Heart disease Father   . Heart disease Maternal Grandfather   . Diabetes Paternal Grandfather   . Breast cancer Sister 65    SOCIAL HX: reviewed.    Current Outpatient Medications:  .  Ascorbic Acid (VITAMIN C PO), Take by mouth., Disp: , Rfl:  .  cyanocobalamin (,VITAMIN B-12,) 1000 MCG/ML injection, INJECT 1 ML INTO THE MUSCLE TWICE A MONTH, Disp: 12 mL, Rfl: 1 .  Digestive Enzymes (SIMILASE PO), Take 1 tablet by mouth 3 (three) times  daily with meals., Disp: , Rfl:  .  EPINEPHrine (EPIPEN 2-PAK) 0.3 mg/0.3 mL IJ SOAJ injection, Inject 0.3 mg into the muscle as needed for anaphylaxis., Disp: 2 each, Rfl: 0 .  metoprolol succinate (TOPROL-XL) 25 MG 24 hr tablet, Take 25 mg by mouth daily., Disp: , Rfl:  .  Multiple Vitamins-Minerals (ZINC PO), Take by mouth., Disp: , Rfl:  .  Syringe/Needle, Disp, (SYRINGE 3CC/25GX1") 25G X 1" 3 ML MISC, Use as directed with b12 injections., Disp: 50 each, Rfl: 2 .  VITAMIN D PO, Take by mouth., Disp: , Rfl:  .  hydrocortisone 2.5 % ointment, Apply to areas of rash twice daily after ketoconazole cream, Disp: 30 g, Rfl: 1 .  ketoconazole (NIZORAL) 2 % cream, Apply to areas of rash twice daily followed by [facility] 2.5% ointment for up to 2 weeks., Disp: 30 g, Rfl: 1  EXAM:  VITALS per patient if applicable: 416/60, 86  GENERAL: alert, oriented, appears well and in no acute distress  HEENT: atraumatic, conjunttiva clear, no obvious abnormalities on inspection of external nose and ears  NECK: normal movements of the head and neck  LUNGS: on inspection no signs of respiratory distress, breathing rate appears normal, no obvious gross SOB, gasping or wheezing  CV: no obvious cyanosis  PSYCH/NEURO: pleasant and cooperative, no obvious depression or anxiety, speech and thought processing grossly intact  ASSESSMENT AND PLAN:  Discussed the following assessment and plan:  Problem List Items Addressed This Visit    COVID-19 virus infection    Symptoms started 06/24/20.  Tested positive 06/27/20.  No chest tightness or sob.  Heart rate/afib - stable.  Discussed nasacort nasal spray and saline nasal spray.  Robitussin DM as directed.  Probiotic/kaopectate - to help with diarrhea.  Stay hydrated.  She is eating and drinking.  Discussed vitamin regimen - including zinc, vitamin C, vitamin D3 and quercetin.  Discussed outpatient treatment options, including remdesivir, monoclonal antibody infusion and oral  antivirals.  (out of window for oral medication). Wants to monitor symptoms.  Rest. Fluids.  Follow symptoms.  Call with update.        Diarrhea    Has had an issue prior to covid with loose stools.  S/p gallbladder removal.  Discussed staying hydrated.  Probiotic.  Follow.        Hypertension    Continue metoprolol.  Blood pressure as outlined.  Follow.       Paroxysmal A-fib [facility])    S/p ablation.  Off xarelto.  Reports overall heart rate/afib - improved.  Follow.  Continue metoprolol.          No follow-ups on file.   I discussed the assessment and treatment plan with the patient. The patient was provided an opportunity to ask questions and all were answered. The patient agreed with the plan and demonstrated an understanding of the instructions.   The patient was advised to  call back or seek an in-person evaluation if the symptoms worsen or if the condition fails to improve as anticipated.    Einar Pheasant, MD

## 2020-07-01 ENCOUNTER — Encounter: Payer: Self-pay | Admitting: Internal Medicine

## 2020-07-02 ENCOUNTER — Encounter: Payer: Self-pay | Admitting: Internal Medicine

## 2020-07-02 ENCOUNTER — Other Ambulatory Visit: Payer: Self-pay

## 2020-07-02 ENCOUNTER — Encounter: Payer: Self-pay | Admitting: Dermatology

## 2020-07-02 MED ORDER — KETOCONAZOLE 2 % EX CREA: TOPICAL_CREAM | CUTANEOUS | 1 refills | Status: DC

## 2020-07-02 MED ORDER — HYDROCORTISONE 2.5 % EX OINT: TOPICAL_OINTMENT | CUTANEOUS | 1 refills | Status: DC

## 2020-07-02 NOTE — Progress Notes (Signed)
Patient with rash at lower abdomen. Sent in ketoconazole 2% cream to use twice daily follow by [facility] 2.5% ointment for up to 2 weeks, JS

## 2020-07-02 NOTE — Telephone Encounter (Signed)
Please call and confirm her symptoms.  Does she feel needs to be seen.  She mentioned persistent diarrhea.  Is she eating, drinking.  Any sob.

## 2020-07-02 NOTE — Telephone Encounter (Signed)
See other message

## 2020-07-02 NOTE — Telephone Encounter (Signed)
I just sent you a message back on her to clarify some information.  Can send in nystatin cream if needed.

## 2020-07-02 NOTE — Telephone Encounter (Signed)
Patient says she is not really feeling worse. Congestion is persistent. Tomorrow is day 10 of quarantine. Scheduled VV with Arnett to discuss quarantine and persistent symptoms. No increased SOB, she is eating/drinking. Still having diarrhea. Waiting on response from dermatology about fungal rash

## 2020-07-03 ENCOUNTER — Encounter: Payer: Self-pay | Admitting: Internal Medicine

## 2020-07-03 ENCOUNTER — Encounter: Payer: Self-pay | Admitting: Family

## 2020-07-03 ENCOUNTER — Telehealth (INDEPENDENT_AMBULATORY_CARE_PROVIDER_SITE_OTHER): Payer: BC Managed Care – PPO | Admitting: Family

## 2020-07-03 VITALS — BP 144/81 | HR 86 | Ht 66.0 in | Wt 235.0 lb

## 2020-07-03 DIAGNOSIS — U071 COVID-19: Secondary | ICD-10-CM | POA: Diagnosis not present

## 2020-07-03 DIAGNOSIS — R197 Diarrhea, unspecified: Secondary | ICD-10-CM | POA: Diagnosis not present

## 2020-07-03 NOTE — Assessment & Plan Note (Signed)
Continue metoprolol.  Blood pressure as outlined.  Follow.

## 2020-07-03 NOTE — Assessment & Plan Note (Addendum)
Cough and congestion x 10 days. She is out of the window for bebtelovimab and paxlovid. Waxing and waning. Patient is well appearing. No acute respiratory distress.  Concerned for secondary bacterial infection and agreed reasonable to start zpak. She has old prescription from a couple of months ago that she never started so I have not prescribed today.  We agreed to start zpak with close monitoring. Offer CXR today; she politely declines.

## 2020-07-03 NOTE — Patient Instructions (Signed)
GI pathogen panel- [facility]  Start zpak  Ensure to take probiotics while on antibiotics and also for 2 weeks after completion. This can either be by eating yogurt daily or taking a probiotic supplement over the counter such as Culturelle.It is important to re-colonize the gut with good bacteria and also to prevent any diarrheal infections associated with antibiotic use.    Let me know if you need anything at all.

## 2020-07-03 NOTE — Assessment & Plan Note (Signed)
Has had an issue prior to covid with loose stools.  S/p gallbladder removal.  Discussed staying hydrated.  Probiotic.  Follow.

## 2020-07-03 NOTE — Assessment & Plan Note (Addendum)
Acute on chronic. No weight loss, fever. Doesn't appear exacerbated from baseline with covid virus however discussed diarrhea as covid symptom. Pending gi pathogen panel to ensure no bacterial infection. She declines GI consult. Advised of importance of probiotics while on antibiotics and that her colonoscopy is due. She will follow up with PCP regarding this.

## 2020-07-03 NOTE — Progress Notes (Signed)
Virtual Visit via Video Note  I connected with@  on 07/03/20 at  2:00 PM EDT by a video enabled telemedicine application and verified that I am speaking with the correct person using two identifiers.  Location patient: home Location provider:work  Persons participating in the virtual visit: patient, provider  I discussed the limitations of evaluation and management by telemedicine and the availability of in person appointments. The patient expressed understanding and agreed to proceed.   DJT:TSVXB visit  Complains of continue clear nasal congestion, dry to productive cough, fatigue which had been improving and then feels like has returned 2 days ago. Cough is intermittent.  Congestion started 10 days ago.   HA resolved. No chills, fever, sore throat, ear pain.   Covid positive 7 days ago 06/27/20.   She has been using mucinex DM with some relief.   Complaints diarrhea which has occurred twice today, can be loose stool to watery, started prior to covid. Diarrhea is triggered by food allergy.   Chronic diarrhea for years. Triggered by sugar, lactose, gluten and improves when avoid these foods. No blood in stool, weight loss, abdominal pain.   She is not on probiotics at this time.  No recent antibiotics.   H/o cholecystectomy 08/2018  H/o diverticulitis   Colonoscopy 2012.   She is walking daily. No cp, sob.   Atrial fib- compliant with toprol 25mg . occasional breakthrough palpitation. No cp.   Consult with Dr Nicki Reaper 06/29/20  ROS: See pertinent positives and negatives per HPI.    EXAM:  VITALS per patient if applicable: BP (!) 939/03   Pulse 86   Ht 5\' 6"  (1.676 m)   Wt 235 lb (106.6 kg)   BMI 37.93 kg/m  BP Readings from Last 3 Encounters:  07/03/20 (!) 144/81  06/29/20 121/78  06/01/20 122/78   Wt Readings from Last 3 Encounters:  07/03/20 235 lb (106.6 kg)  06/29/20 235 lb (106.6 kg)  06/01/20 238 lb (108 kg)    GENERAL: alert, oriented, appears well and  in no acute distress  HEENT: atraumatic, conjunttiva clear, no obvious abnormalities on inspection of external nose and ears  NECK: normal movements of the head and neck  LUNGS: on inspection no signs of respiratory distress, breathing rate appears normal, no obvious gross SOB, gasping or wheezing  CV: no obvious cyanosis  MS: moves all visible extremities without noticeable abnormality  PSYCH/NEURO: pleasant and cooperative, no obvious depression or anxiety, speech and thought processing grossly intact  ASSESSMENT AND PLAN:  Discussed the following assessment and plan:  Problem List Items Addressed This Visit      Other   COVID-19 virus infection - Primary    Cough and congestion x 10 days. She is out of the window for bebtelovimab and paxlovid. Waxing and waning. Patient is well appearing. No acute respiratory distress.  Concerned for secondary bacterial infection and agreed reasonable to start zpak. She has old prescription from a couple of months ago that she never started so I have not prescribed today.  We agreed to start zpak with close monitoring. Offer CXR today; she politely declines.       Relevant Orders   GI pathogen panel by PCR, stool   Diarrhea    Acute on chronic. No weight loss, fever. Doesn't appear exacerbated from baseline with covid virus however discussed diarrhea as covid symptom. Pending gi pathogen panel to ensure no bacterial infection. She declines GI consult. Advised of importance of probiotics while on antibiotics and that  her colonoscopy is due. She will follow up with PCP regarding this.          -we discussed possible serious and likely etiologies, options for evaluation and workup, limitations of telemedicine visit vs in person visit, treatment, treatment risks and precautions. Pt prefers to treat via telemedicine empirically rather then risking or undertaking an in person visit at this moment.  .   I discussed the assessment and treatment plan  with the patient. The patient was provided an opportunity to ask questions and all were answered. The patient agreed with the plan and demonstrated an understanding of the instructions.   The patient was advised to call back or seek an in-person evaluation if the symptoms worsen or if the condition fails to improve as anticipated.   Joy Paris, FNP

## 2020-07-03 NOTE — Assessment & Plan Note (Addendum)
S/p ablation.  Off xarelto.  Reports overall heart rate/afib - improved.  Follow.  Continue metoprolol.

## 2020-07-03 NOTE — Assessment & Plan Note (Signed)
Symptoms started 06/24/20.  Tested positive 06/27/20.  No chest tightness or sob.  Heart rate/afib - stable.  Discussed nasacort nasal spray and saline nasal spray.  Robitussin DM as directed.  Probiotic/kaopectate - to help with diarrhea.  Stay hydrated.  She is eating and drinking.  Discussed vitamin regimen - including zinc, vitamin C, vitamin D3 and quercetin.  Discussed outpatient treatment options, including remdesivir, monoclonal antibody infusion and oral antivirals.  (out of window for oral medication). Wants to monitor symptoms.  Rest. Fluids.  Follow symptoms.  Call with update.

## 2020-07-04 ENCOUNTER — Other Ambulatory Visit
Admission: RE | Admit: 2020-07-04 | Discharge: 2020-07-04 | Disposition: A | Discharge status: Home / Self Care | Payer: BC Managed Care – PPO | Source: Ambulatory Visit | Attending: Family | Admitting: Family

## 2020-07-04 DIAGNOSIS — U071 COVID-19: Secondary | ICD-10-CM | POA: Insufficient documentation

## 2020-07-04 LAB — GASTROINTESTINAL PANEL BY PCR, STOOL (REPLACES STOOL CULTURE)

## 2020-07-09 DIAGNOSIS — R5383 Other fatigue: Secondary | ICD-10-CM | POA: Diagnosis not present

## 2020-07-09 DIAGNOSIS — E039 Hypothyroidism, unspecified: Secondary | ICD-10-CM | POA: Diagnosis not present

## 2020-07-09 DIAGNOSIS — U071 COVID-19: Secondary | ICD-10-CM | POA: Diagnosis not present

## 2020-07-09 DIAGNOSIS — E538 Deficiency of other specified B group vitamins: Secondary | ICD-10-CM | POA: Diagnosis not present

## 2020-07-09 DIAGNOSIS — Z77120 Contact with and (suspected) exposure to mold (toxic): Secondary | ICD-10-CM | POA: Diagnosis not present

## 2020-07-09 DIAGNOSIS — B2790 Infectious mononucleosis, unspecified without complication: Secondary | ICD-10-CM | POA: Diagnosis not present

## 2020-07-23 IMAGING — CR CHEST - 2 VIEW
2 series · 2 of 2 positions shown · non-contrast
Comparison: 05/10/2018

CLINICAL DATA: Right lower chest discomfort

EXAM:
CHEST - 2 VIEW

[chest pa]
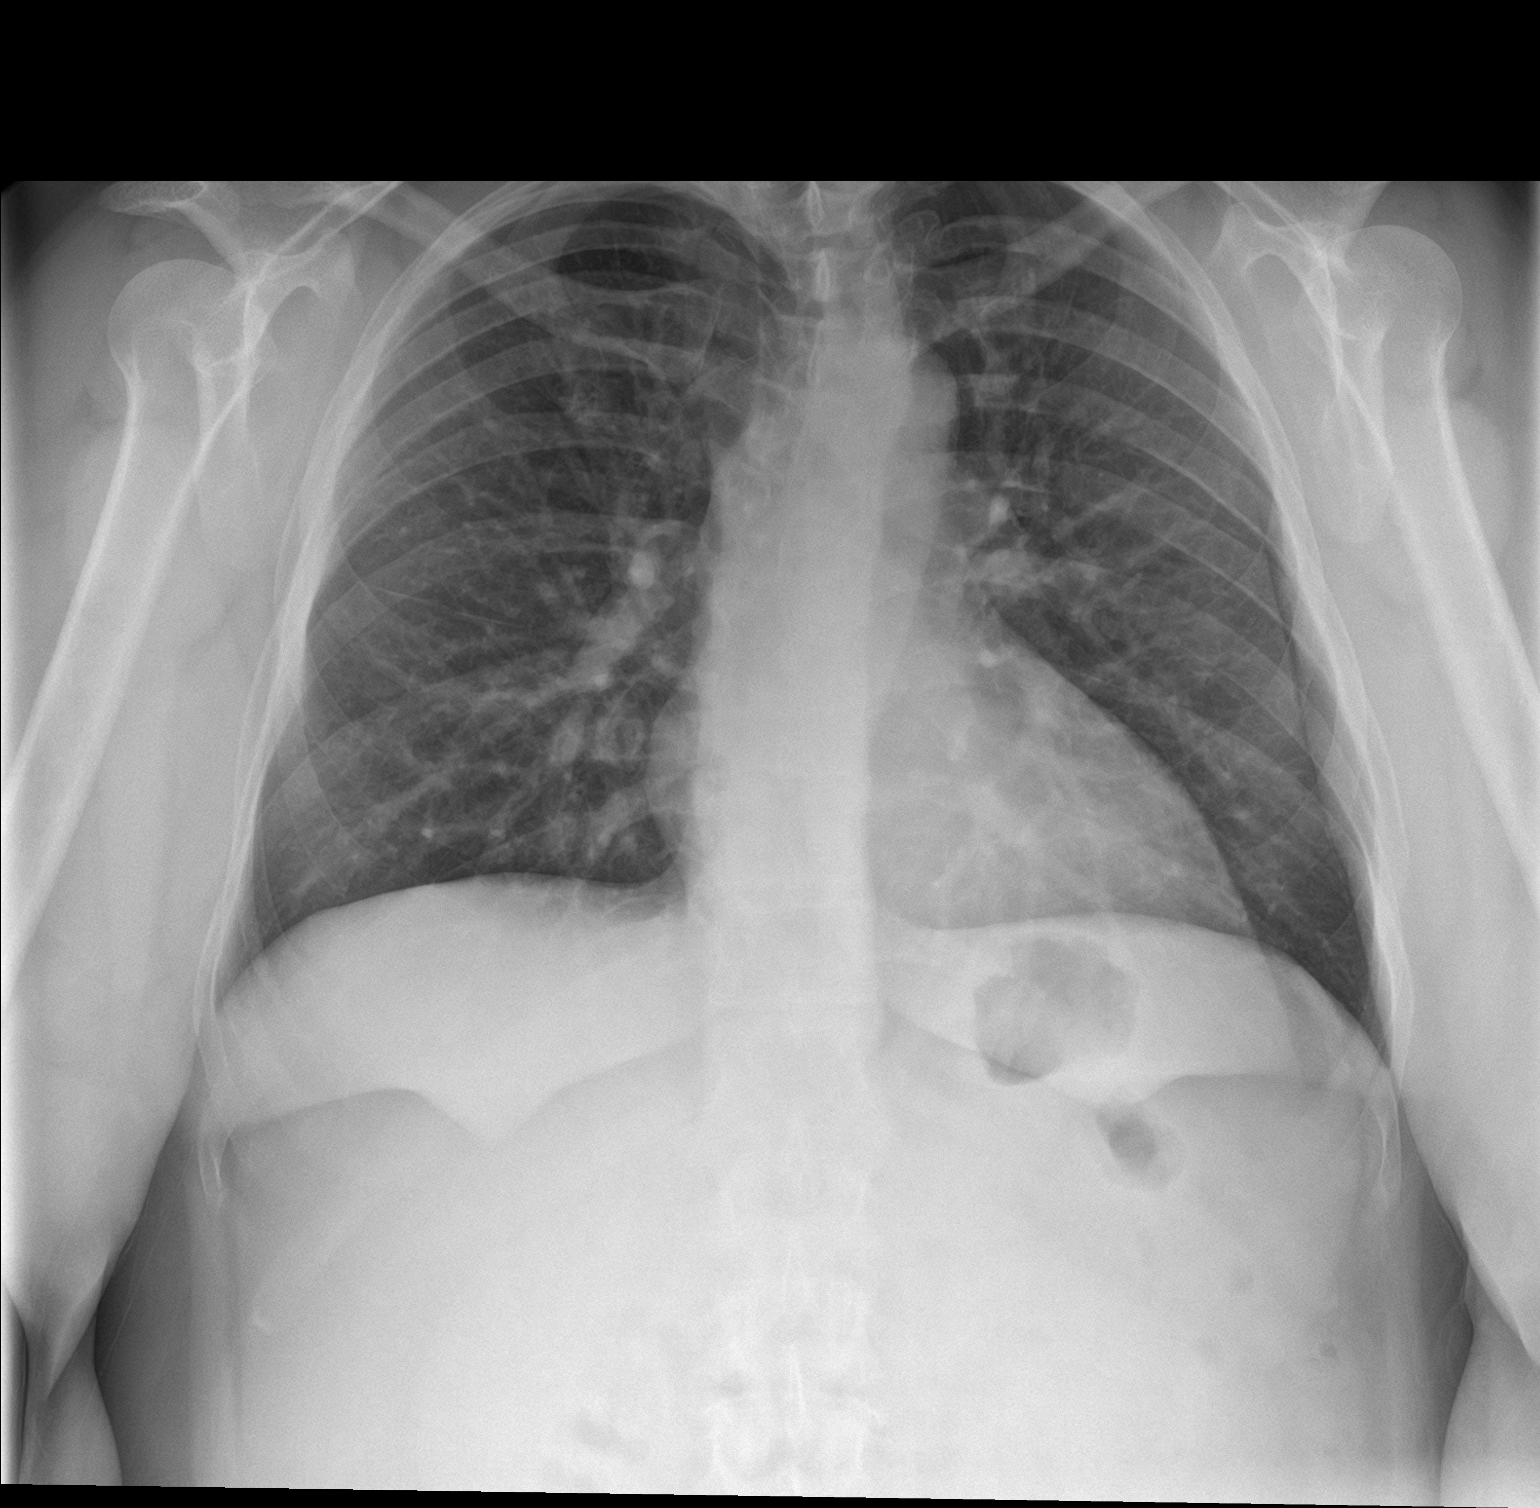

[chest lat]
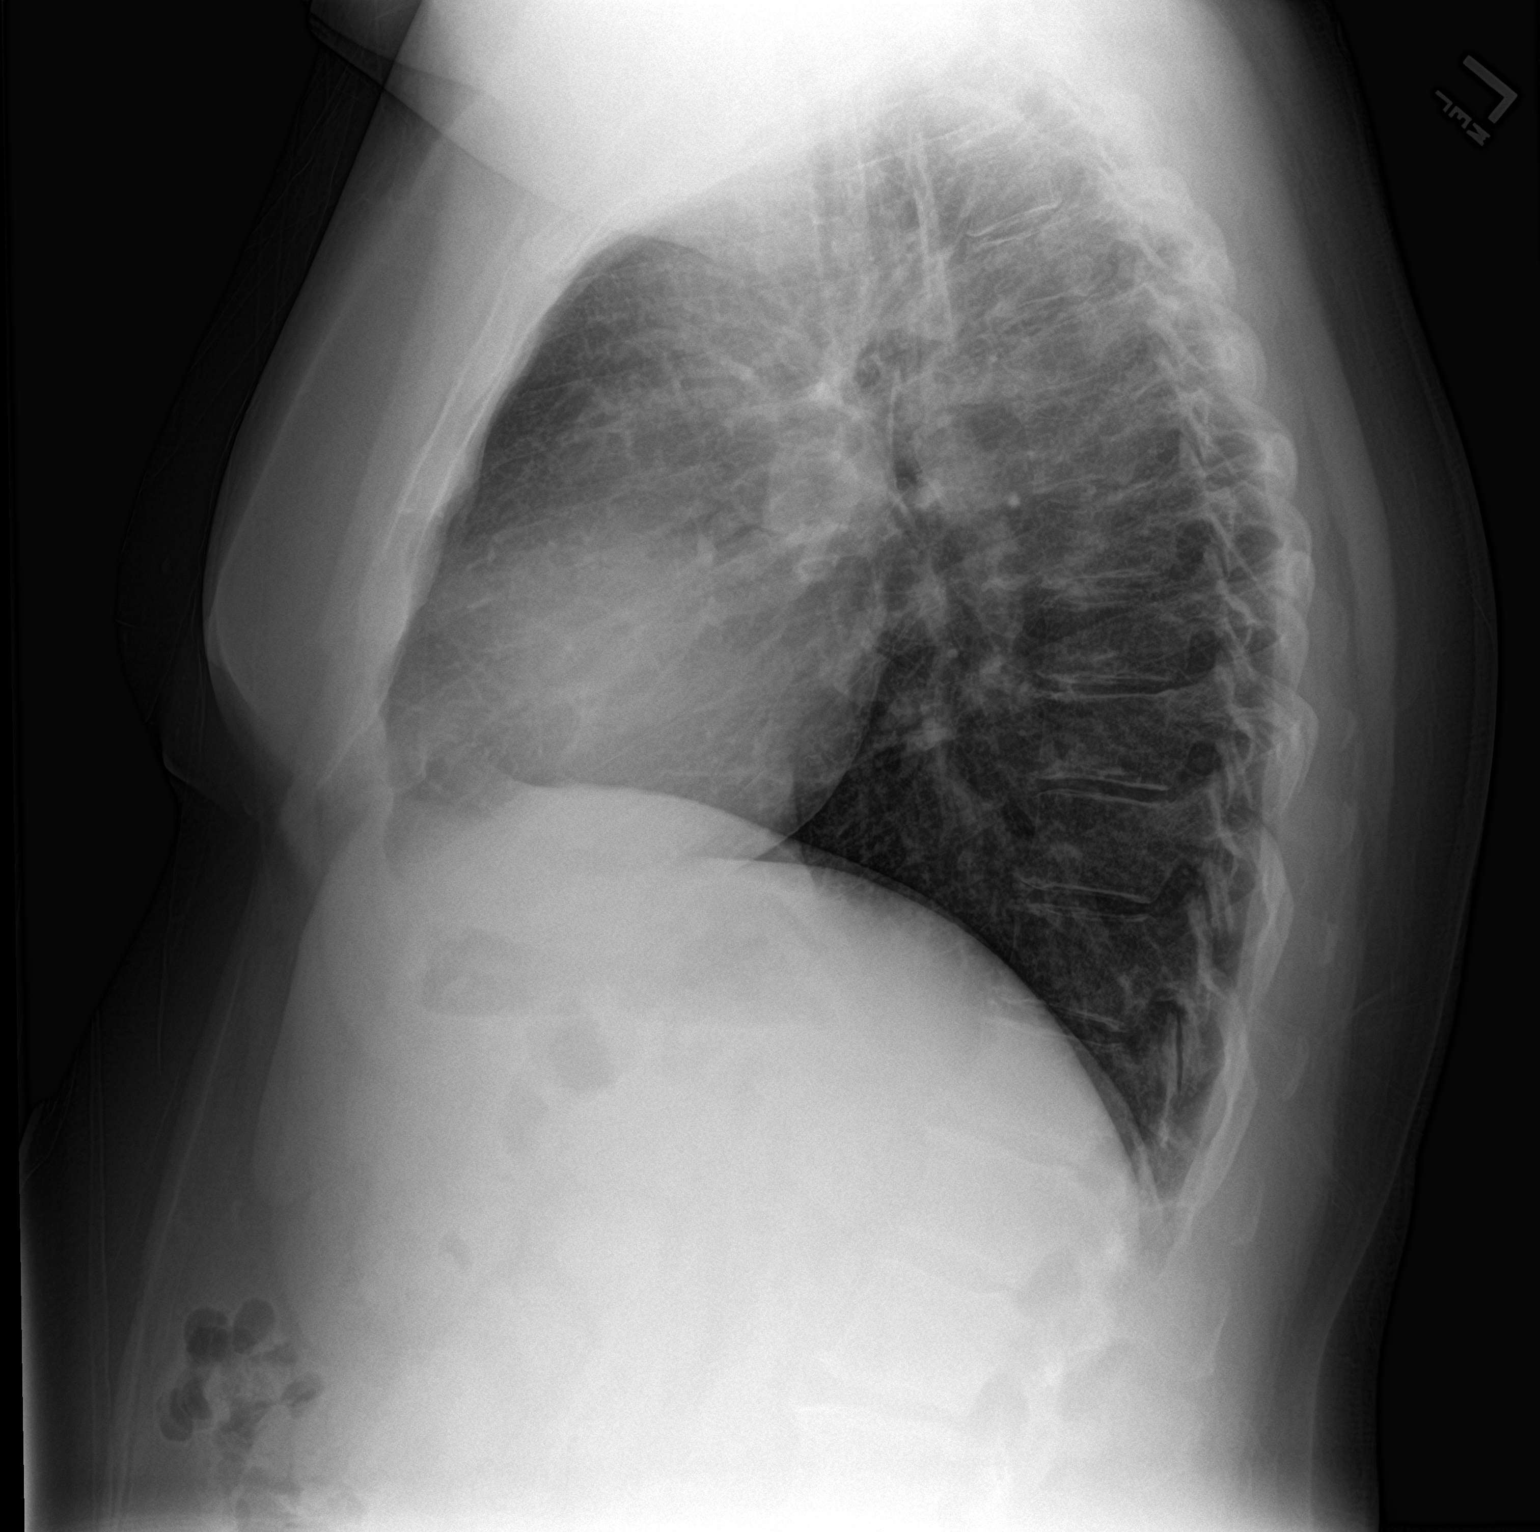

[2 of 2 positions shown; findings below may reference images not displayed]

FINDINGS: The heart size and mediastinal contours are within normal limits.
Both lungs are clear. Mild scoliosis of the spine.
IMPRESSION: No active cardiopulmonary disease.

## 2020-07-23 IMAGING — US ULTRASOUND ABDOMEN LIMITED
1 series · 14 of 25 positions shown · non-contrast
Comparison: Abdominal ultrasound January 21, 2015

CLINICAL DATA: Right upper quadrant pain

EXAM:
ULTRASOUND ABDOMEN LIMITED RIGHT UPPER QUADRANT

[Series 1: ultrasound abdomen limited · 14 of 64 slices shown]
[im 1/64]
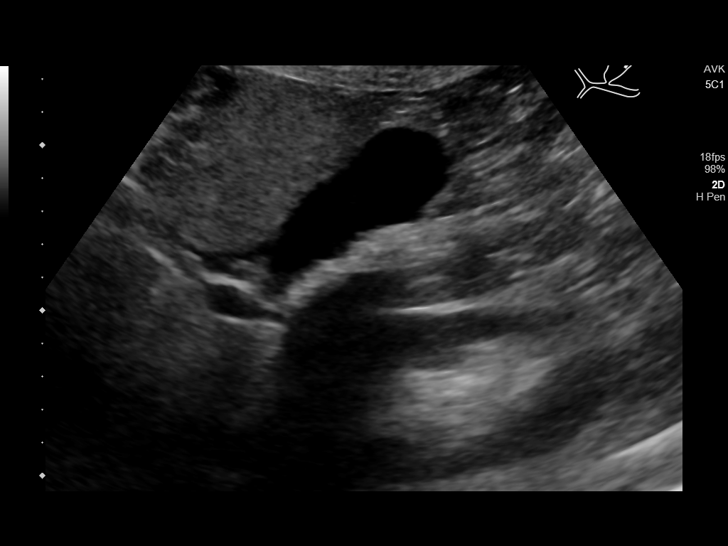
[im 6/64]
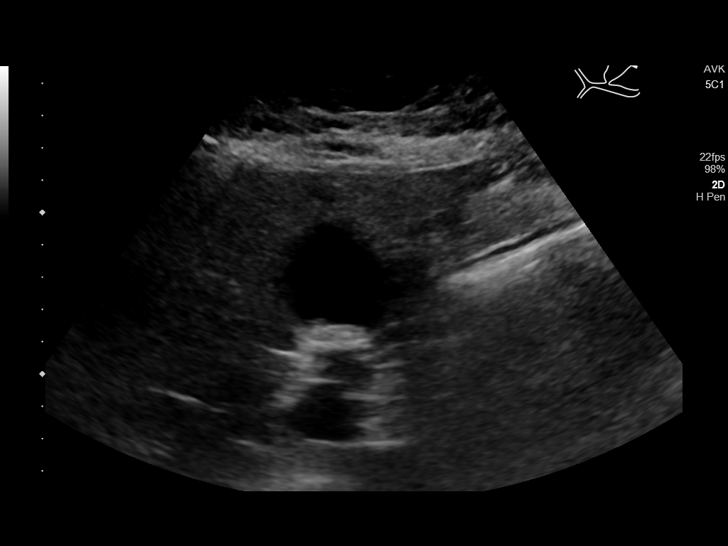
[im 11/64]
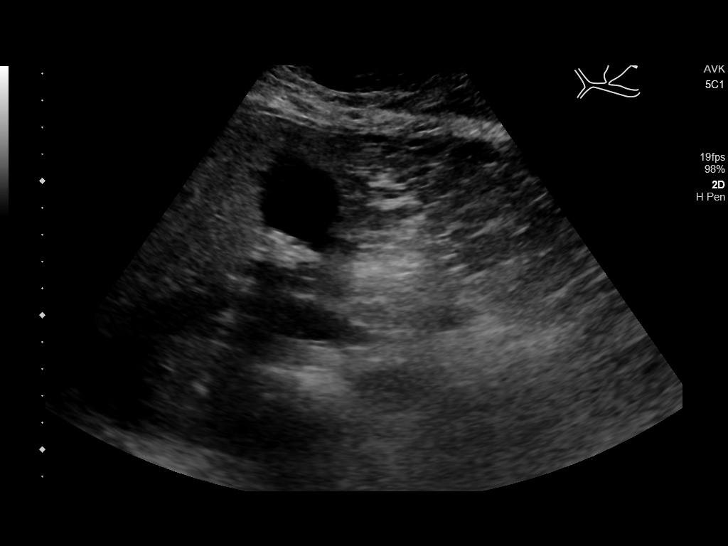
[im 16/64]
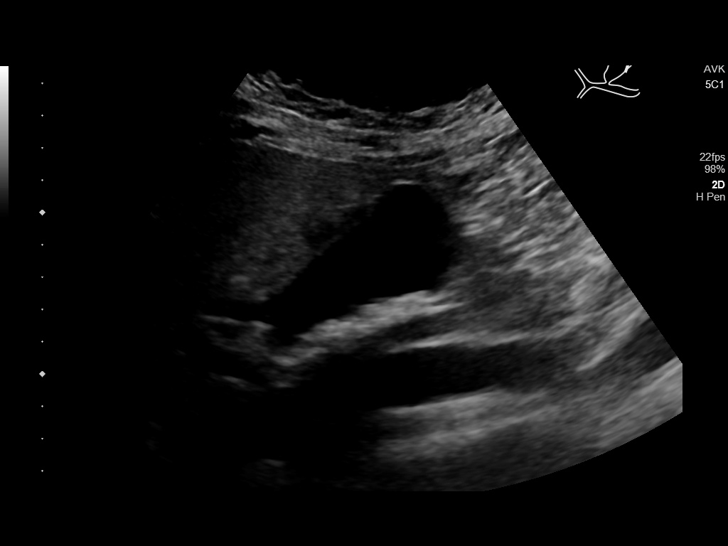
[im 22/64]
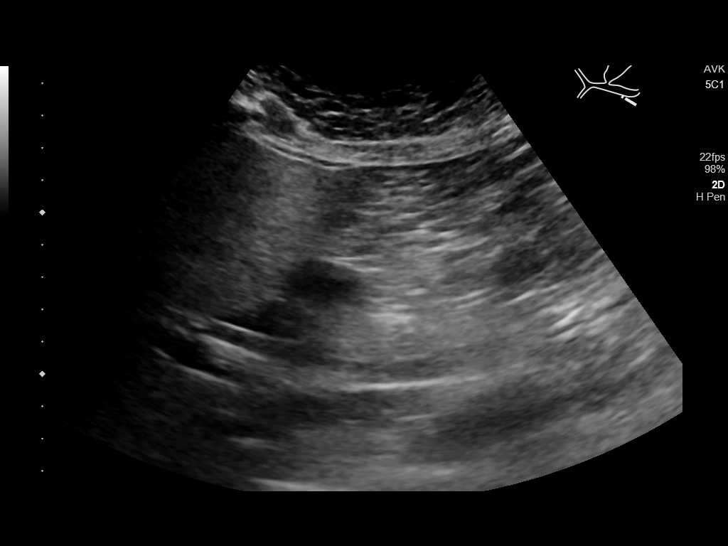
[im 24/64]
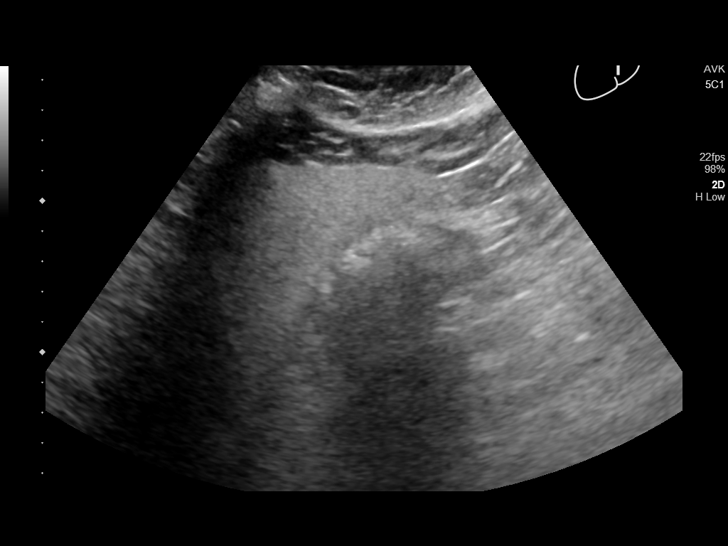
[im 29/64]
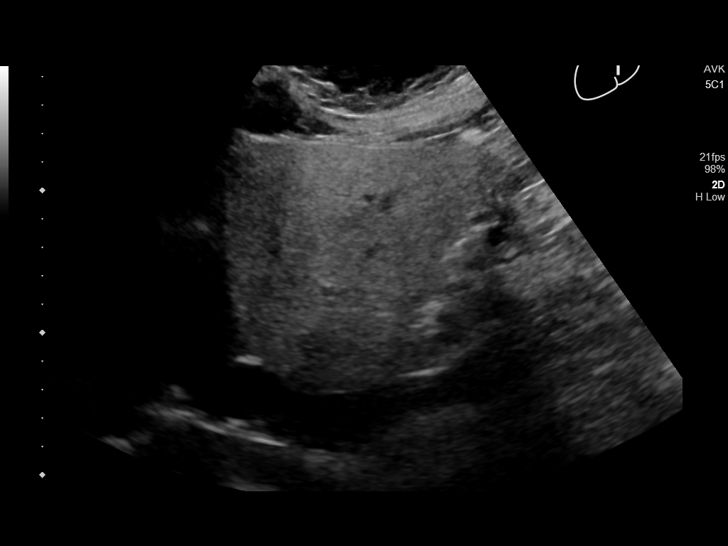
[im 35/64]
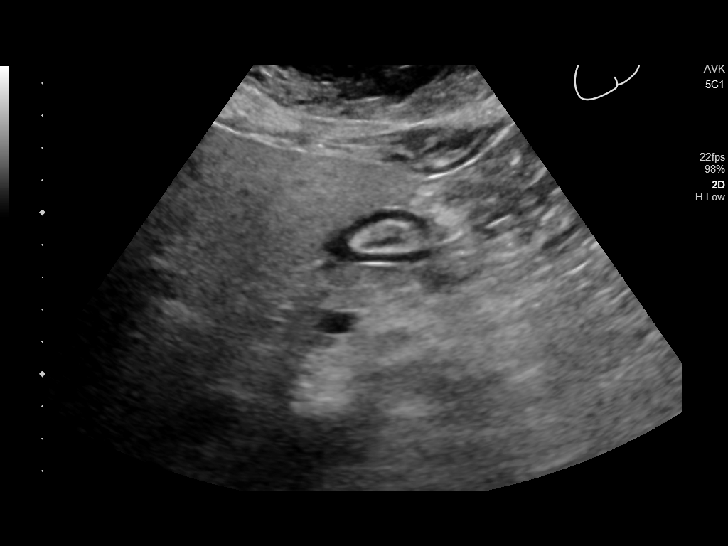
[im 40/64]
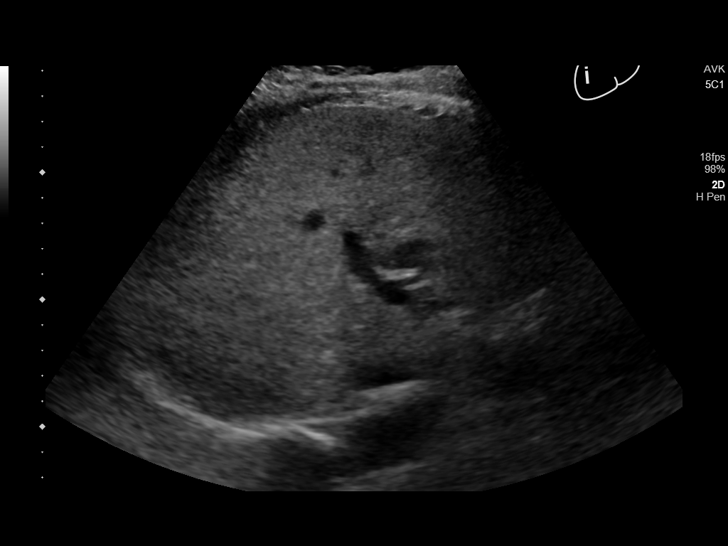
[im 43/64]
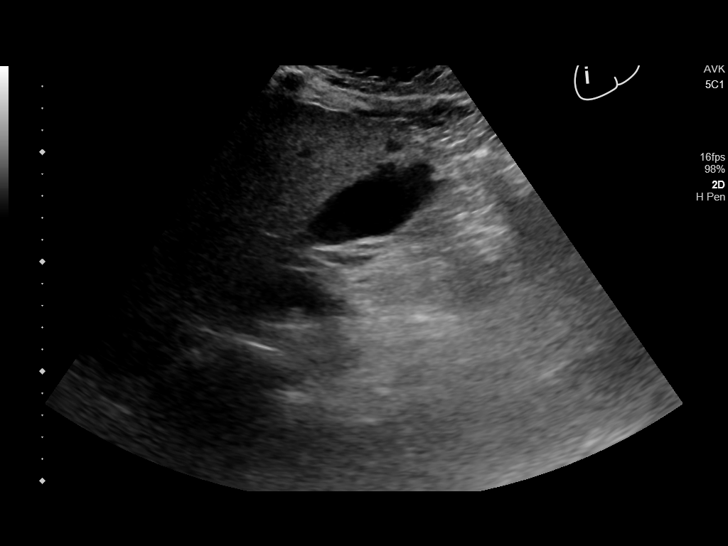
[im 48/64]
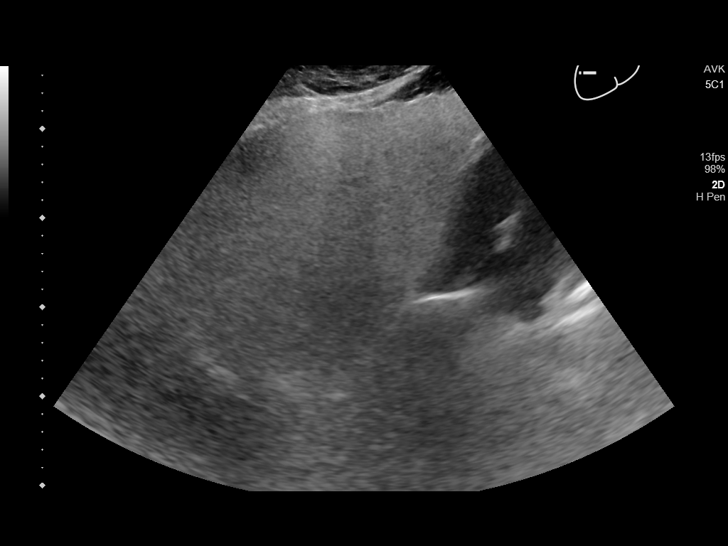
[im 53/64]
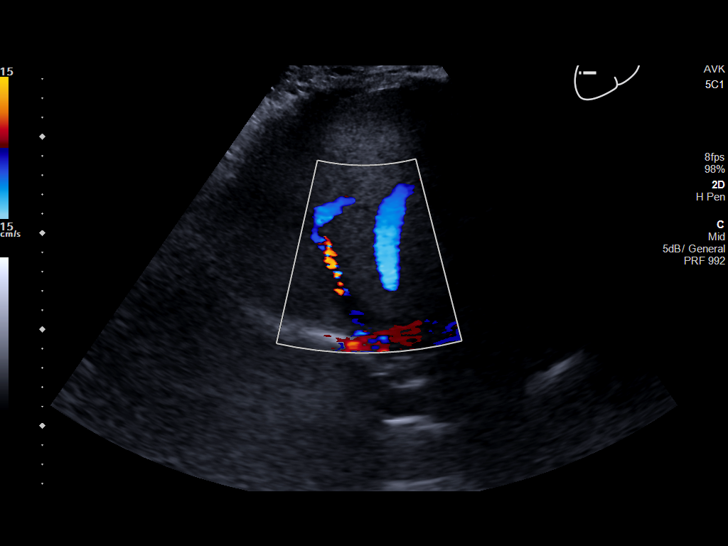
[im 58/64]
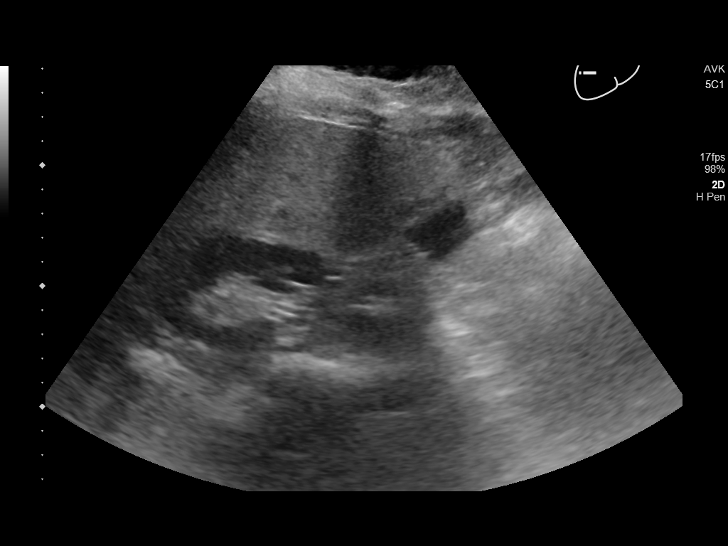
[im 64/64]
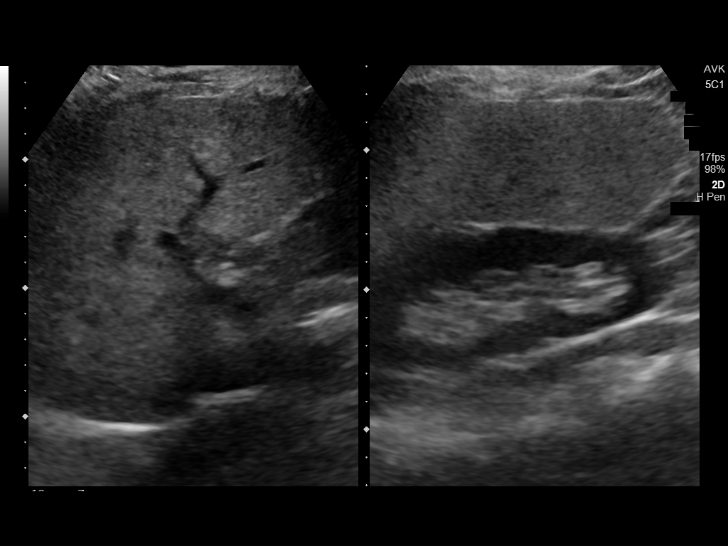

[14 of 25 positions shown; findings below may reference images not displayed]

FINDINGS: Gallbladder:

Within the gallbladder, there are multiple echogenic foci which move
and shadow consistent with cholelithiasis. Largest individual
gallstone measures 8 mm in length. There is no appreciable
gallbladder wall thickening or pericholecystic fluid. No sonographic
Murphy sign noted by sonographer.

Common bile duct:

Diameter: 5 mm. No intrahepatic or extrahepatic biliary duct
dilatation.

Liver:

No focal lesion identified. Liver echogenicity is increased
diffusely. Portal vein is patent on color Doppler imaging with
normal direction of blood flow towards the liver.
IMPRESSION: 1. Cholelithiasis. No gallbladder wall thickening or pericholecystic
fluid.

2. Increase in liver echogenicity, a finding indicative of hepatic
steatosis. While no focal liver lesions are evident on this study,
it must be cautioned that the sensitivity of ultrasound for
detection of focal liver lesions is diminished in this circumstance.

## 2020-08-07 ENCOUNTER — Ambulatory Visit: Payer: BC Managed Care – PPO | Admitting: Internal Medicine

## 2020-08-07 ENCOUNTER — Other Ambulatory Visit: Payer: Self-pay

## 2020-08-07 ENCOUNTER — Encounter: Payer: Self-pay | Admitting: Internal Medicine

## 2020-08-07 VITALS — BP 136/82 | HR 82 | Temp 96.6°F | Resp 16 | Ht 66.0 in | Wt 240.0 lb

## 2020-08-07 DIAGNOSIS — I480 Paroxysmal atrial fibrillation: Secondary | ICD-10-CM

## 2020-08-07 DIAGNOSIS — K760 Fatty (change of) liver, not elsewhere classified: Secondary | ICD-10-CM | POA: Diagnosis not present

## 2020-08-07 DIAGNOSIS — I10 Essential (primary) hypertension: Secondary | ICD-10-CM | POA: Diagnosis not present

## 2020-08-07 DIAGNOSIS — E1165 Type 2 diabetes mellitus with hyperglycemia: Secondary | ICD-10-CM

## 2020-08-07 DIAGNOSIS — R197 Diarrhea, unspecified: Secondary | ICD-10-CM

## 2020-08-07 DIAGNOSIS — E538 Deficiency of other specified B group vitamins: Secondary | ICD-10-CM

## 2020-08-07 DIAGNOSIS — E782 Mixed hyperlipidemia: Secondary | ICD-10-CM

## 2020-08-07 DIAGNOSIS — R195 Other fecal abnormalities: Secondary | ICD-10-CM

## 2020-08-07 DIAGNOSIS — M5442 Lumbago with sciatica, left side: Secondary | ICD-10-CM

## 2020-08-07 LAB — LIPID PANEL
Cholesterol: 188 mg/dL (ref 0–200)
HDL: 63.8 mg/dL (ref >39.00)
LDL Cholesterol: 107 mg/dL — ABNORMAL HIGH (ref 0–99)
NonHDL: 123.96
Total CHOL/HDL Ratio: 3
Triglycerides: 86 mg/dL (ref 0.0–149.0)
VLDL: 17.2 mg/dL (ref 0.0–40.0)

## 2020-08-07 LAB — HEPATIC FUNCTION PANEL
ALT: 24 U/L (ref 0–35)
AST: 18 U/L (ref 0–37)
Albumin: 4.4 g/dL (ref 3.5–5.2)
Alkaline Phosphatase: 81 U/L (ref 39–117)
Bilirubin, Direct: 0.1 mg/dL (ref 0.0–0.3)
Total Bilirubin: 0.6 mg/dL (ref 0.2–1.2)
Total Protein: 7.2 g/dL (ref 6.0–8.3)

## 2020-08-07 LAB — C-REACTIVE PROTEIN: CRP: <1 mg/dL (ref 0.5–20.0)

## 2020-08-07 LAB — BASIC METABOLIC PANEL
BUN: 13 mg/dL (ref 6–23)
CO2: 25 mEq/L (ref 19–32)
Calcium: 9.6 mg/dL (ref 8.4–10.5)
Chloride: 105 mEq/L (ref 96–112)
Creatinine, Ser: 0.67 mg/dL (ref 0.40–1.20)
GFR: 100.09 mL/min (ref >60.00)
Glucose, Bld: 90 mg/dL (ref 70–99)
Potassium: 4 mEq/L (ref 3.5–5.1)
Sodium: 140 mEq/L (ref 135–145)

## 2020-08-07 LAB — SEDIMENTATION RATE: Sed Rate: 13 mm/hr (ref 0–30)

## 2020-08-07 NOTE — Progress Notes (Addendum)
Patient ID: Joy Houston, female   DOB: 03-Dec-1965, 56 y.o.   MRN: [id number]   Subjective:    Patient ID: Joy Houston, female    DOB: May 03, 1965, 55 y.o.   MRN: [id number]  HPI This visit occurred during the SARS-CoV-2 public health emergency.  Safety protocols were in place, including screening questions prior to the visit, additional usage of staff PPE, and extensive cleaning of exam room while observing appropriate contact time as indicated for disinfecting solutions.   Patient here for a scheduled follow up.  Here to follow up regarding her afib, blood pressure and to discuss recently labs.  Also having persistent loose stool.  Recently diagnosed with covid.  Saw her integrative MD recently.  Treated with ivermectin.  Also had labs.  Discussed lab results.  Able to walk more and has started walking more.  Afib - under better control s/p ablation. No chest pain reported.  Breathing stable.  No increased cough or congestion.  No increased abdominal pain reported.  Persistent loose stool.  Has been an issue for years.  S/p cholecystectomy.  Was an issue prior to her gallbladder being removed.  No sick contacts.  No fever.  No nausea or vomiting.  Working part time job. Handling stress better.     Past Medical History:  Diagnosis Date   Anemia    Angio-edema    Atrial fibrillation ([place])    Complication of anesthesia    Diabetes mellitus without complication ([place])    diet controlled   Dysplastic nevus 06/27/2018   Left upper back. Moderate atypia, limited margins free.    Eczema    Epstein Barr infection    Family history of anesthesia complication    Mother - confusion   Fatty liver    Heart murmur    Slight - "nothing to worry about"   Hemorrhoid    Hypertension    Hypothyroidism    PONV (postoperative nausea and vomiting)    "only with c-sections"   Sleep apnea    mild   Past Surgical History:  Procedure Laterality Date   CESAREAN SECTION     CHOLECYSTECTOMY N/A  09/07/2018   Procedure: LAPAROSCOPIC CHOLECYSTECTOMY;  Surgeon: Herbert Pun, MD;  Location: [facility] ORS;  Service: General;  Laterality: N/A;   FINGER SURGERY     pin to 3rd finger Right hand   LUMBAR LAMINECTOMY/DECOMPRESSION MICRODISCECTOMY  12/15/2011   Procedure: LUMBAR LAMINECTOMY/DECOMPRESSION MICRODISCECTOMY 1 LEVEL;  Surgeon: Ophelia Charter, MD;  Location: [place] [facility];  Service: Neurosurgery;  Laterality: Right;  RIGHT Lumbar four-Five diskectomy   LUMBAR LAMINECTOMY/DECOMPRESSION MICRODISCECTOMY N/A 11/04/2019   Procedure: MICRODISCECTOMY LEFT LUMBAR FIVE SACRAL ONE;  Surgeon: Newman Pies, MD;  Location: [place];  Service: Neurosurgery;  Laterality: N/A;  3C   MICRODISCECTOMY LUMBAR     L4-5   WISDOM TOOTH EXTRACTION     Family History  Problem Relation Age of Onset   GER disease Mother    Diabetes Father    Cancer Father        Melanoma, Prostate   Heart disease Father    Heart disease Maternal Grandfather    Diabetes Paternal Grandfather    Breast cancer Sister 27   Social History   Socioeconomic History   Marital status: Married    Spouse name: Not on file   Number of children: 2   Years of education: Not on file   Highest education level: Not on file  Occupational History    Employer:  [employer]  Tobacco Use   Smoking status: Never   Smokeless tobacco: Never  Vaping Use   Vaping Use: Never used  Substance and Sexual Activity   Alcohol use: Not Currently    Alcohol/week: 0.0 standard drinks   Drug use: No   Sexual activity: Not on file  Other Topics Concern   Not on file  Social History Narrative   Nurse    Married   Social Determinants of Health   Financial Resource Strain: Not on file  Food Insecurity: Not on file  Transportation Needs: Not on file  Physical Activity: Not on file  Stress: Not on file  Social Connections: Not on file    Outpatient Encounter Medications as of 08/07/2020  Medication Sig   Ascorbic Acid (VITAMIN  C PO) Take by mouth.   cyanocobalamin (,VITAMIN B-12,) 1000 MCG/ML injection INJECT 1 ML INTO THE MUSCLE TWICE A MONTH   Digestive Enzymes (SIMILASE PO) Take 1 tablet by mouth 3 (three) times daily with meals.   EPINEPHrine (EPIPEN 2-PAK) 0.3 mg/0.3 mL IJ SOAJ injection Inject 0.3 mg into the muscle as needed for anaphylaxis.   hydrocortisone 2.5 % ointment Apply to areas of rash twice daily after ketoconazole cream   ketoconazole (NIZORAL) 2 % cream Apply to areas of rash twice daily followed by [facility] 2.5% ointment for up to 2 weeks.   metoprolol succinate (TOPROL-XL) 25 MG 24 hr tablet Take 12.5 mg by mouth daily.   Multiple Vitamins-Minerals (ZINC PO) Take by mouth.   Syringe/Needle, Disp, (SYRINGE 3CC/25GX1") 25G X 1" 3 ML MISC Use as directed with b12 injections.   VITAMIN D PO Take by mouth.   No facility-administered encounter medications on file as of 08/07/2020.     Review of Systems  Constitutional:  Negative for appetite change and unexpected weight change.  HENT:  Negative for congestion and sinus pressure.   Respiratory:  Negative for cough, chest tightness and shortness of breath.   Cardiovascular:  Negative for chest pain and palpitations.  Gastrointestinal:  Positive for diarrhea. Negative for abdominal pain, nausea and vomiting.       No increased abdominal pain.   Genitourinary:  Negative for difficulty urinating and dysuria.  Musculoskeletal:  Negative for joint swelling and myalgias.  Skin:  Negative for color change and rash.  Neurological:  Negative for dizziness, light-headedness and headaches.  Psychiatric/Behavioral:  Negative for agitation and dysphoric mood.       Objective:    Physical Exam Vitals reviewed.  Constitutional:      General: She is not in acute distress. HENT:     Head: Normocephalic and atraumatic.     Right Ear: External ear normal.     Left Ear: External ear normal.  Eyes:     General: No scleral icterus.       Right eye: No discharge.         Left eye: No discharge.     Conjunctiva/sclera: Conjunctivae normal.  Neck:     Thyroid: No thyromegaly.  Cardiovascular:     Rate and Rhythm: Normal rate and regular rhythm.  Pulmonary:     Effort: No respiratory distress.     Breath sounds: Normal breath sounds. No wheezing.  Abdominal:     General: Bowel sounds are normal.     Palpations: Abdomen is soft.     Tenderness: There is no abdominal tenderness.  Musculoskeletal:        General: No swelling or tenderness.  Cervical back: Neck supple. No tenderness.  Lymphadenopathy:     Cervical: No cervical adenopathy.  Skin:    Findings: No erythema or rash.  Neurological:     Mental Status: She is alert.  Psychiatric:        Mood and Affect: Mood normal.        Behavior: Behavior normal.    BP 136/82   Pulse 82   Temp (!) 96.6 F (35.9 C)   Resp 16   Ht _0  (1.676 m)   Wt 240 lb (108.9 kg)   SpO2 98%   BMI 38.74 kg/m  Wt Readings from Last 3 Encounters:  08/07/20 240 lb (108.9 kg)  07/03/20 235 lb (106.6 kg)  06/29/20 235 lb (106.6 kg)     Lab Results  Component Value Date   WBC 7.5 03/04/2020   HGB 15.5 (H) 03/04/2020   HCT 46.0 03/04/2020   PLT 211.0 03/04/2020   GLUCOSE 90 08/07/2020   CHOL 188 08/07/2020   TRIG 86.0 08/07/2020   HDL 63.80 08/07/2020   LDLDIRECT 145.4 03/01/2013   LDLCALC 107 (H) 08/07/2020   ALT 24 08/07/2020   AST 18 08/07/2020   NA 140 08/07/2020   K 4.0 08/07/2020   CL 105 08/07/2020   CREATININE 0.67 08/07/2020   BUN 13 08/07/2020   CO2 25 08/07/2020   TSH 4.41 04/17/2020   HGBA1C 5.3 03/04/2020   MICROALBUR 1.9 10/30/2019       Assessment & Plan:   Problem List Items Addressed This Visit     B12 deficiency    Continue B12 injections.  Follow.        Diarrhea   Relevant Orders   C-reactive protein (Completed)   Sedimentation rate (Completed)   Fatty liver    Diet, exercise and weight loss.  Follow liver panel.        Relevant Orders   Hepatic  function panel (Completed)   Hyperlipidemia, mixed    Low cholesterol diet and exercise.  Follow lipid panel.        Hypertension - Primary    On metoprolol, but 1/2 dose now.  Blood pressure elevated.  Spot check pressure.  Follow metabolic panel. Notify me if persistent elevation.        Loose stools    Loose stool - persistent.  Has been an issue for a while.  S/p cholecystectomy.  No fever.  Discussed fiber and probiotics.  She has adjusted her diet.  Cut out gluten.  Given persistence, get her back in with GI for evaluation and question of need for colonoscopy.         Relevant Orders   Ambulatory referral to Gastroenterology   Low back pain    S/p surgery.  Stable.        Paroxysmal A-fib [facility])    S/p ablation.  Off xarelto.  Afib/symptoms improved.  On 1/2 metoprolol now.  Follow pressures.  Follow metabolic panel.        Type 2 diabetes mellitus with hyperglycemia (HCC)    Low carb diet and exercise.  Is starting to exercise more.  Follow met b and a1c.        Relevant Orders   Lipid panel (Completed)   Basic metabolic panel (Completed)     Einar Pheasant, MD

## 2020-08-09 ENCOUNTER — Encounter: Payer: Self-pay | Admitting: Internal Medicine

## 2020-08-09 DIAGNOSIS — R195 Other fecal abnormalities: Secondary | ICD-10-CM | POA: Insufficient documentation

## 2020-08-09 NOTE — Assessment & Plan Note (Signed)
S/p ablation.  Off xarelto.  Afib/symptoms improved.  On 1/2 metoprolol now.  Follow pressures.  Follow metabolic panel.

## 2020-08-09 NOTE — Assessment & Plan Note (Signed)
Low carb diet and exercise.  Is starting to exercise more.  Follow met b and a1c.  

## 2020-08-09 NOTE — Assessment & Plan Note (Signed)
On metoprolol, but 1/2 dose now.  Blood pressure elevated.  Spot check pressure.  Follow metabolic panel. Notify me if persistent elevation.

## 2020-08-09 NOTE — Assessment & Plan Note (Signed)
Continue B12 injections.  Follow.

## 2020-08-09 NOTE — Assessment & Plan Note (Signed)
Loose stool - persistent.  Has been an issue for a while.  S/p cholecystectomy.  No fever.  Discussed fiber and probiotics.  She has adjusted her diet.  Cut out gluten.  Given persistence, get her back in with GI for evaluation and question of need for colonoscopy.

## 2020-08-09 NOTE — Assessment & Plan Note (Signed)
S/p surgery.  Stable.

## 2020-08-09 NOTE — Assessment & Plan Note (Signed)
Diet, exercise and weight loss.  Follow liver panel.

## 2020-08-09 NOTE — Assessment & Plan Note (Signed)
Low cholesterol diet and exercise.  Follow lipid panel.   

## 2020-08-11 ENCOUNTER — Telehealth: Payer: Self-pay | Admitting: Internal Medicine

## 2020-08-11 DIAGNOSIS — R195 Other fecal abnormalities: Secondary | ICD-10-CM

## 2020-08-11 NOTE — Telephone Encounter (Signed)
I had placed an order for a referral to Dr Evalee Mutton (GI at [facility]).  Dr Redmond Pulling contacted me and recommended referring to Dr Christia Reading.  Joy Houston is agreeable to see Dr Lorenso Courier.  Do I need to place order for another referral.  Dr Christia Reading is with [facility].  Just let me know.  Thank you for letting me know.    Dr Nicki Reaper

## 2020-08-11 NOTE — Telephone Encounter (Signed)
My chart message sent to pt to regarding GI referral.

## 2020-08-14 NOTE — Telephone Encounter (Signed)
If a new referral is needed I can send this in

## 2020-08-14 NOTE — Telephone Encounter (Signed)
Pt called to follow up on a new referral to Dr. Suanne Marker at [facility]

## 2020-08-14 NOTE — Telephone Encounter (Signed)
Raelea just sent me a message that she wants to see Dr Suanne Marker at [facility]  Do I need to place order for another referral?  Just let me know.  Thanks

## 2020-08-17 NOTE — Addendum Note (Signed)
Addended by: Thressa Sheller on: 08/17/2020 07:26 AM   Modules accepted: Orders

## 2020-08-17 NOTE — Telephone Encounter (Signed)
Referral placed inside of last office visit

## 2020-08-17 NOTE — Telephone Encounter (Signed)
Thank you :)

## 2020-08-20 ENCOUNTER — Other Ambulatory Visit: Payer: Self-pay | Admitting: Dermatology

## 2020-08-31 DIAGNOSIS — K589 Irritable bowel syndrome without diarrhea: Secondary | ICD-10-CM | POA: Diagnosis not present

## 2020-08-31 DIAGNOSIS — E039 Hypothyroidism, unspecified: Secondary | ICD-10-CM | POA: Diagnosis not present

## 2020-08-31 DIAGNOSIS — Z77120 Contact with and (suspected) exposure to mold (toxic): Secondary | ICD-10-CM | POA: Diagnosis not present

## 2020-08-31 DIAGNOSIS — R5383 Other fatigue: Secondary | ICD-10-CM | POA: Diagnosis not present

## 2020-09-03 ENCOUNTER — Encounter: Payer: Self-pay | Admitting: Internal Medicine

## 2020-09-11 DIAGNOSIS — Z6839 Body mass index (BMI) 39.0-39.9, adult: Secondary | ICD-10-CM | POA: Diagnosis not present

## 2020-09-11 DIAGNOSIS — R195 Other fecal abnormalities: Secondary | ICD-10-CM | POA: Diagnosis not present

## 2020-09-11 DIAGNOSIS — R197 Diarrhea, unspecified: Secondary | ICD-10-CM | POA: Diagnosis not present

## 2020-09-24 ENCOUNTER — Other Ambulatory Visit: Payer: Self-pay | Admitting: Dermatology

## 2020-09-24 DIAGNOSIS — R197 Diarrhea, unspecified: Secondary | ICD-10-CM | POA: Diagnosis not present

## 2020-10-07 ENCOUNTER — Encounter: Payer: Self-pay | Admitting: Internal Medicine

## 2020-10-08 NOTE — Telephone Encounter (Signed)
See phone note in patients chart.

## 2020-10-12 DIAGNOSIS — I4891 Unspecified atrial fibrillation: Secondary | ICD-10-CM | POA: Diagnosis not present

## 2020-10-12 DIAGNOSIS — R197 Diarrhea, unspecified: Secondary | ICD-10-CM | POA: Diagnosis not present

## 2020-10-12 DIAGNOSIS — R1013 Epigastric pain: Secondary | ICD-10-CM | POA: Diagnosis not present

## 2020-10-12 DIAGNOSIS — E039 Hypothyroidism, unspecified: Secondary | ICD-10-CM | POA: Diagnosis not present

## 2020-10-12 DIAGNOSIS — E119 Type 2 diabetes mellitus without complications: Secondary | ICD-10-CM | POA: Diagnosis not present

## 2020-10-12 DIAGNOSIS — E785 Hyperlipidemia, unspecified: Secondary | ICD-10-CM | POA: Diagnosis not present

## 2020-10-12 DIAGNOSIS — Z9049 Acquired absence of other specified parts of digestive tract: Secondary | ICD-10-CM | POA: Diagnosis not present

## 2020-10-12 DIAGNOSIS — R142 Eructation: Secondary | ICD-10-CM | POA: Diagnosis not present

## 2020-10-12 DIAGNOSIS — Z8616 Personal history of COVID-19: Secondary | ICD-10-CM | POA: Diagnosis not present

## 2020-10-12 DIAGNOSIS — R5383 Other fatigue: Secondary | ICD-10-CM | POA: Diagnosis not present

## 2020-10-12 DIAGNOSIS — R6883 Chills (without fever): Secondary | ICD-10-CM | POA: Diagnosis not present

## 2020-10-12 DIAGNOSIS — R1031 Right lower quadrant pain: Secondary | ICD-10-CM | POA: Diagnosis not present

## 2020-10-12 DIAGNOSIS — K5730 Diverticulosis of large intestine without perforation or abscess without bleeding: Secondary | ICD-10-CM | POA: Diagnosis not present

## 2020-10-12 DIAGNOSIS — Z885 Allergy status to narcotic agent status: Secondary | ICD-10-CM | POA: Diagnosis not present

## 2020-10-12 DIAGNOSIS — Z888 Allergy status to other drugs, medicaments and biological substances status: Secondary | ICD-10-CM | POA: Diagnosis not present

## 2020-10-12 DIAGNOSIS — B379 Candidiasis, unspecified: Secondary | ICD-10-CM | POA: Diagnosis not present

## 2020-10-12 DIAGNOSIS — I10 Essential (primary) hypertension: Secondary | ICD-10-CM | POA: Diagnosis not present

## 2020-10-12 DIAGNOSIS — R109 Unspecified abdominal pain: Secondary | ICD-10-CM | POA: Diagnosis not present

## 2020-10-13 ENCOUNTER — Encounter: Payer: Self-pay | Admitting: Internal Medicine

## 2020-10-13 DIAGNOSIS — K5730 Diverticulosis of large intestine without perforation or abscess without bleeding: Secondary | ICD-10-CM | POA: Diagnosis not present

## 2020-10-29 ENCOUNTER — Other Ambulatory Visit: Payer: Self-pay

## 2020-10-29 ENCOUNTER — Ambulatory Visit: Payer: BC Managed Care – PPO | Admitting: Internal Medicine

## 2020-10-29 ENCOUNTER — Encounter: Payer: Self-pay | Admitting: Internal Medicine

## 2020-10-29 VITALS — BP 130/86 | HR 74 | Temp 97.7°F | Ht 65.98 in | Wt 239.6 lb

## 2020-10-29 DIAGNOSIS — E538 Deficiency of other specified B group vitamins: Secondary | ICD-10-CM

## 2020-10-29 DIAGNOSIS — I10 Essential (primary) hypertension: Secondary | ICD-10-CM

## 2020-10-29 DIAGNOSIS — R195 Other fecal abnormalities: Secondary | ICD-10-CM | POA: Diagnosis not present

## 2020-10-29 DIAGNOSIS — E782 Mixed hyperlipidemia: Secondary | ICD-10-CM

## 2020-10-29 DIAGNOSIS — I480 Paroxysmal atrial fibrillation: Secondary | ICD-10-CM

## 2020-10-29 DIAGNOSIS — E1165 Type 2 diabetes mellitus with hyperglycemia: Secondary | ICD-10-CM | POA: Diagnosis not present

## 2020-10-29 MED ORDER — CYANOCOBALAMIN 1000 MCG/ML IJ SOLN
1000.0000 ug | Freq: Once | INTRAMUSCULAR | Status: AC
Start: 2020-10-29 — End: 2020-10-29
  Administered 2020-10-29: 1000 ug via INTRAMUSCULAR

## 2020-10-29 NOTE — Progress Notes (Signed)
Patient ID: Joy Houston, female   DOB: 1965-05-10, 55 y.o.   MRN: [id number]   Subjective:    Patient ID: Joy Houston, female    DOB: [birth year], 55 y.o.   MRN: [id number]  This visit occurred during the SARS-CoV-2 public health emergency.  Safety protocols were in place, including screening questions prior to the visit, additional usage of staff PPE, and extensive cleaning of exam room while observing appropriate contact time as indicated for disinfecting solutions.   Patient here for scheduled follow up .   HPI Follow up regarding  persistent diarrhea and afib.  Has had issues with persistent diarrhea.  Seems to be worse after covid.  Was seen in ER for abdominal pain.  CT no acute abnormality.  Saw GI.  Was prescribed colestid.  Planning for colonoscopy in 12/2020.  Still with diarrhea.  Some days better than others, but persistent.  Some days - 2x/day and some 3-4x/day.  Has tried adjusting diet.  Avoiding gluten, etc.  Joint pain is some better.  She is eating.  No vomiting.  Afib - better.  Walking more.  More active.  Breathing better.  No cough or congestion.  Handling stress.     Past Medical History:  Diagnosis Date   Anemia    Angio-edema    Atrial fibrillation ([place])    Complication of anesthesia    Diabetes mellitus without complication ([place])    diet controlled   Dysplastic nevus 06/27/2018   Left upper back. Moderate atypia, limited margins free.    Eczema    Epstein Barr infection    Family history of anesthesia complication    Mother - confusion   Fatty liver    Heart murmur    Slight - "nothing to worry about"   Hemorrhoid    Hypertension    Hypothyroidism    PONV (postoperative nausea and vomiting)    "only with c-sections"   Sleep apnea    mild   Past Surgical History:  Procedure Laterality Date   CESAREAN SECTION     CHOLECYSTECTOMY N/A 09/07/2018   Procedure: LAPAROSCOPIC CHOLECYSTECTOMY;  Surgeon: Herbert Pun, MD;  Location: [facility] ORS;   Service: General;  Laterality: N/A;   FINGER SURGERY     pin to 3rd finger Right hand   LUMBAR LAMINECTOMY/DECOMPRESSION MICRODISCECTOMY  12/15/2011   Procedure: LUMBAR LAMINECTOMY/DECOMPRESSION MICRODISCECTOMY 1 LEVEL;  Surgeon: Ophelia Charter, MD;  Location: [place] [facility];  Service: Neurosurgery;  Laterality: Right;  RIGHT Lumbar four-Five diskectomy   LUMBAR LAMINECTOMY/DECOMPRESSION MICRODISCECTOMY N/A 11/04/2019   Procedure: MICRODISCECTOMY LEFT LUMBAR FIVE SACRAL ONE;  Surgeon: Newman Pies, MD;  Location: [place];  Service: Neurosurgery;  Laterality: N/A;  3C   MICRODISCECTOMY LUMBAR     L4-5   WISDOM TOOTH EXTRACTION     Family History  Problem Relation Age of Onset   GER disease Mother    Diabetes Father    Cancer Father        Melanoma, Prostate   Heart disease Father    Heart disease Maternal Grandfather    Diabetes Paternal Grandfather    Breast cancer Sister 24   Social History   Socioeconomic History   Marital status: Married    Spouse name: Not on file   Number of children: 2   Years of education: Not on file   Highest education level: Not on file  Occupational History    Employer: [employer]  Tobacco Use   Smoking status: Never  Smokeless tobacco: Never  Vaping Use   Vaping Use: Never used  Substance and Sexual Activity   Alcohol use: Not Currently    Alcohol/week: 0.0 standard drinks   Drug use: No   Sexual activity: Not on file  Other Topics Concern   Not on file  Social History Narrative   Nurse    Married   Social Determinants of Health   Financial Resource Strain: Not on file  Food Insecurity: Not on file  Transportation Needs: Not on file  Physical Activity: Not on file  Stress: Not on file  Social Connections: Not on file    Review of Systems  Constitutional:  Negative for appetite change and unexpected weight change.  HENT:  Negative for congestion and sinus pressure.   Respiratory:  Negative for cough, chest tightness  and shortness of breath.   Cardiovascular:  Negative for chest pain, palpitations and leg swelling.  Gastrointestinal:  Positive for diarrhea. Negative for vomiting.       Abdominal pain - better.    Genitourinary:  Negative for difficulty urinating and dysuria.  Musculoskeletal:  Negative for myalgias.       Joint pain - some better.   Skin:  Negative for color change and rash.  Neurological:  Negative for dizziness, light-headedness and headaches.  Psychiatric/Behavioral:  Negative for agitation and dysphoric mood.       Objective:     BP 130/86   Pulse 74   Temp 97.7 F (36.5 C)   Ht 5' 5.98" (1.676 m)   Wt 239 lb 9.6 oz (108.7 kg)   SpO2 96%   BMI 38.69 kg/m  Wt Readings from Last 3 Encounters:  10/29/20 239 lb 9.6 oz (108.7 kg)  08/07/20 240 lb (108.9 kg)  07/03/20 235 lb (106.6 kg)    Physical Exam Vitals reviewed.  Constitutional:      General: She is not in acute distress.    Appearance: Normal appearance.  HENT:     Head: Normocephalic and atraumatic.     Right Ear: External ear normal.     Left Ear: External ear normal.  Eyes:     General: No scleral icterus.       Right eye: No discharge.        Left eye: No discharge.     Conjunctiva/sclera: Conjunctivae normal.  Neck:     Thyroid: No thyromegaly.  Cardiovascular:     Rate and Rhythm: Normal rate and regular rhythm.  Pulmonary:     Effort: No respiratory distress.     Breath sounds: Normal breath sounds. No wheezing.  Abdominal:     General: Bowel sounds are normal.     Palpations: Abdomen is soft.     Tenderness: There is no abdominal tenderness.  Musculoskeletal:        General: No swelling or tenderness.     Cervical back: Neck supple. No tenderness.  Lymphadenopathy:     Cervical: No cervical adenopathy.  Skin:    Findings: No erythema or rash.  Neurological:     Mental Status: She is alert.  Psychiatric:        Mood and Affect: Mood normal.        Behavior: Behavior normal.      Outpatient Encounter Medications as of 10/29/2020  Medication Sig   Ascorbic Acid (VITAMIN C PO) Take by mouth.   cyanocobalamin (,VITAMIN B-12,) 1000 MCG/ML injection INJECT 1 ML INTO THE MUSCLE TWICE A MONTH   Digestive Enzymes (SIMILASE PO)  Take 1 tablet by mouth 3 (three) times daily with meals.   EPINEPHrine (EPIPEN 2-PAK) 0.3 mg/0.3 mL IJ SOAJ injection Inject 0.3 mg into the muscle as needed for anaphylaxis.   hydrocortisone 2.5 % ointment APPLY TO AREAS OF RASH TWICE DAILY AFTER KETOCONAZOLE CREAM   ketoconazole (NIZORAL) 2 % cream APPLY TO AREAS OF RASH TWICE DAILY FOLLOWED BY [facility] 2.5% OINTMENT FOR UP TO 2 WEEKS.   metoprolol succinate (TOPROL-XL) 25 MG 24 hr tablet Take 12.5 mg by mouth daily.   Multiple Vitamins-Minerals (ZINC PO) Take by mouth.   Syringe/Needle, Disp, (SYRINGE 3CC/25GX1") 25G X 1" 3 ML MISC Use as directed with b12 injections.   VITAMIN D PO Take by mouth.   [EXPIRED] cyanocobalamin ((VITAMIN B-12)) injection 1,000 mcg    No facility-administered encounter medications on file as of 10/29/2020.     Lab Results  Component Value Date   WBC 7.5 03/04/2020   HGB 15.5 (H) 03/04/2020   HCT 46.0 03/04/2020   PLT 211.0 03/04/2020   GLUCOSE 90 08/07/2020   CHOL 188 08/07/2020   TRIG 86.0 08/07/2020   HDL 63.80 08/07/2020   LDLDIRECT 145.4 03/01/2013   LDLCALC 107 (H) 08/07/2020   ALT 24 08/07/2020   AST 18 08/07/2020   NA 140 08/07/2020   K 4.0 08/07/2020   CL 105 08/07/2020   CREATININE 0.67 08/07/2020   BUN 13 08/07/2020   CO2 25 08/07/2020   TSH 4.41 04/17/2020   HGBA1C 5.3 03/04/2020   MICROALBUR 1.9 10/30/2019       Assessment & Plan:   Problem List Items Addressed This Visit     B12 deficiency - Primary    Continue B12 injections.  Change to q month.       Hyperlipidemia, mixed    Low cholesterol diet and exercise.  Follow lipid panel.       Hypertension    Blood pressure elevated today.  Have her spot check her pressure.  Send in  readings.  Hold on making adjustments in medication.  If persistent elevation, will adjust.       Loose stools    Persistent loose stool.  Appears to be worse after covid.  Is s/p cholecystectomy.  Was present prior to having her gallbladder removed.  Saw GI.  Prescribed colestid.  Has not started.  Wants to hold.  Scheduled for colonoscopy in 12/2020.  Has adjusted diet.        Paroxysmal A-fib [facility])    S/p ablation.  Off xarelto.  Afib/symptoms improved.  On 1/2 metoprolol now.  Follow pressures.  Doing better. Walking.  Breathing better.       Type 2 diabetes mellitus with hyperglycemia (HCC)    Low carb diet and exercise.  Is starting to exercise more.  Follow met b and a1c.         Einar Pheasant, MD

## 2020-11-02 ENCOUNTER — Encounter: Payer: Self-pay | Admitting: Internal Medicine

## 2020-11-02 DIAGNOSIS — I10 Essential (primary) hypertension: Secondary | ICD-10-CM | POA: Diagnosis not present

## 2020-11-02 DIAGNOSIS — A071 Giardiasis [lambliasis]: Secondary | ICD-10-CM | POA: Diagnosis not present

## 2020-11-02 DIAGNOSIS — E039 Hypothyroidism, unspecified: Secondary | ICD-10-CM | POA: Diagnosis not present

## 2020-11-02 DIAGNOSIS — R197 Diarrhea, unspecified: Secondary | ICD-10-CM | POA: Diagnosis not present

## 2020-11-02 NOTE — Assessment & Plan Note (Signed)
Continue B12 injections.  Change to q month.

## 2020-11-02 NOTE — Assessment & Plan Note (Signed)
Low cholesterol diet and exercise.  Follow lipid panel.   

## 2020-11-02 NOTE — Assessment & Plan Note (Signed)
Low carb diet and exercise.  Is starting to exercise more.  Follow met b and a1c.

## 2020-11-02 NOTE — Assessment & Plan Note (Signed)
Blood pressure elevated today.  Have her spot check her pressure.  Send in readings.  Hold on making adjustments in medication.  If persistent elevation, will adjust.

## 2020-11-02 NOTE — Assessment & Plan Note (Signed)
Persistent loose stool.  Appears to be worse after covid.  Is s/p cholecystectomy.  Was present prior to having her gallbladder removed.  Saw GI.  Prescribed colestid.  Has not started.  Wants to hold.  Scheduled for colonoscopy in 12/2020.  Has adjusted diet.

## 2020-11-02 NOTE — Assessment & Plan Note (Addendum)
S/p ablation.  Off xarelto.  Afib/symptoms improved.  On 1/2 metoprolol now.  Follow pressures.  Doing better. Walking.  Breathing better.

## 2020-11-30 ENCOUNTER — Other Ambulatory Visit: Payer: Self-pay | Admitting: Internal Medicine

## 2020-12-04 DIAGNOSIS — R197 Diarrhea, unspecified: Secondary | ICD-10-CM | POA: Diagnosis not present

## 2020-12-04 DIAGNOSIS — Z9049 Acquired absence of other specified parts of digestive tract: Secondary | ICD-10-CM | POA: Diagnosis not present

## 2020-12-21 DIAGNOSIS — I480 Paroxysmal atrial fibrillation: Secondary | ICD-10-CM | POA: Diagnosis not present

## 2020-12-21 DIAGNOSIS — I10 Essential (primary) hypertension: Secondary | ICD-10-CM | POA: Diagnosis not present

## 2020-12-24 ENCOUNTER — Other Ambulatory Visit: Payer: Self-pay

## 2020-12-24 ENCOUNTER — Ambulatory Visit: Payer: BC Managed Care – PPO | Admitting: Dermatology

## 2020-12-24 ENCOUNTER — Encounter: Payer: Self-pay | Admitting: Dermatology

## 2020-12-24 DIAGNOSIS — L578 Other skin changes due to chronic exposure to nonionizing radiation: Secondary | ICD-10-CM

## 2020-12-24 DIAGNOSIS — Z1283 Encounter for screening for malignant neoplasm of skin: Secondary | ICD-10-CM | POA: Diagnosis not present

## 2020-12-24 DIAGNOSIS — L304 Erythema intertrigo: Secondary | ICD-10-CM

## 2020-12-24 DIAGNOSIS — D1800 Hemangioma unspecified site: Secondary | ICD-10-CM

## 2020-12-24 DIAGNOSIS — Z86018 Personal history of other benign neoplasm: Secondary | ICD-10-CM

## 2020-12-24 DIAGNOSIS — L814 Other melanin hyperpigmentation: Secondary | ICD-10-CM

## 2020-12-24 DIAGNOSIS — D485 Neoplasm of uncertain behavior of skin: Secondary | ICD-10-CM

## 2020-12-24 DIAGNOSIS — L821 Other seborrheic keratosis: Secondary | ICD-10-CM

## 2020-12-24 DIAGNOSIS — D229 Melanocytic nevi, unspecified: Secondary | ICD-10-CM

## 2020-12-24 DIAGNOSIS — Z808 Family history of malignant neoplasm of other organs or systems: Secondary | ICD-10-CM

## 2020-12-24 MED ORDER — KETOCONAZOLE 2 % EX CREA
TOPICAL_CREAM | CUTANEOUS | 2 refills | Status: DC
Start: 2020-12-24 — End: 2022-03-29

## 2020-12-24 MED ORDER — HYDROCORTISONE 2.5 % EX OINT: TOPICAL_OINTMENT | CUTANEOUS | 2 refills | Status: DC

## 2020-12-24 NOTE — Progress Notes (Signed)
Follow-Up Visit   Subjective  Joy Houston is a 55 y.o. female who presents for the following: FBSE (Patient here for full body skin exam and skin cancer screening. Patient with hx of dysplastic nevus, fhx of melanoma. Patient is not aware of any new or changing spots. She does have a cyst at right axilla that has grown and she would like excised. ).  The following portions of the chart were reviewed this encounter and updated as appropriate:   Tobacco  Allergies  Meds  Problems  Med Hx  Surg Hx  Fam Hx      Review of Systems:  No other skin or systemic complaints except as noted in HPI or Assessment and Plan.  Objective  Well appearing patient in no apparent distress; mood and affect are within normal limits.  A full examination was performed including scalp, head, eyes, ears, nose, lips, neck, chest, axillae, abdomen, back, buttocks, bilateral upper extremities, bilateral lower extremities, hands, feet, fingers, toes, fingernails, and toenails. All findings within normal limits unless otherwise noted below.  Right Axilla 1.2 cm subcutaneous nodule  low abdomen Erythematous patch   Assessment & Plan  Neoplasm of uncertain behavior of skin Right Axilla  Exam most c/w cyst with symptoms and/or recent change.  Discussed surgical excision to remove, including resulting scar and possible recurrence.  Patient will schedule for surgery. Pre-op information given.     Erythema intertrigo low abdomen  Chronic condition with duration or expected duration over one year. Currently well-controlled.  Intertrigo is a chronic recurrent rash that occurs in skin fold areas that may be associated with friction; heat; moisture; yeast; fungus; and bacteria.  It is exacerbated by increased movement / activity; sweating; and higher atmospheric temperature.  Continue ketoconazole 2% cream twice daily as needed. Continue HC 2.5% cream twice daily for up to 1 - 2 weeks as needed.    Related Medications ketoconazole (NIZORAL) 2 % cream Apply to areas of rash twice daily as needed.  hydrocortisone 2.5 % ointment For up to 1-2 weeks as needed.  Lentigines - Scattered tan macules - Due to sun exposure - Benign-appearing, observe - Recommend daily broad spectrum sunscreen SPF 30+ to sun-exposed areas, reapply every 2 hours as needed. - Call for any changes  Seborrheic Keratoses - Stuck-on, waxy, tan-brown papules and/or plaques  - Benign-appearing - Discussed benign etiology and prognosis. - Observe - Call for any changes  Melanocytic Nevi - Tan-brown and/or pink-flesh-colored symmetric macules and papules - Benign appearing on exam today - Observation - Call clinic for new or changing moles - Recommend daily use of broad spectrum spf 30+ sunscreen to sun-exposed areas.   Hemangiomas - Red papules - Discussed benign nature - Observe - Call for any changes  Actinic Damage - Chronic condition, secondary to cumulative UV/sun exposure - diffuse scaly erythematous macules with underlying dyspigmentation - Recommend daily broad spectrum sunscreen SPF 30+ to sun-exposed areas, reapply every 2 hours as needed.  - Staying in the shade or wearing long sleeves, sun glasses (UVA+UVB protection) and wide brim hats (4-inch brim around the entire circumference of the hat) are also recommended for sun protection.  - Call for new or changing lesions.  Skin cancer screening performed today.  History of Dysplastic Nevi - No evidence of recurrence today - Recommend regular full body skin exams - Recommend daily broad spectrum sunscreen SPF 30+ to sun-exposed areas, reapply every 2 hours as needed.  - Call if any new or changing  lesions are noted between office visits  Return in about 1 year (around 12/24/2021) for TBSE.  Graciella Belton, RMA, am acting as scribe for Forest Gleason, MD .  Documentation: I have reviewed the above documentation for accuracy and  completeness, and I agree with the above.  Forest Gleason, MD

## 2020-12-24 NOTE — Patient Instructions (Addendum)
Continue ketoconazole 2% cream twice daily as needed. Continue HC 2.5% cream twice daily for up to 1 - 2 weeks as needed.  Recommend Zeasorb AF powder for maintenance.  Recommend taking Heliocare sun protection supplement daily in sunny weather for additional sun protection. For maximum protection on the sunniest days, you can take up to 2 capsules of regular Heliocare OR take 1 capsule of Heliocare Ultra. For prolonged exposure (such as a full day in the sun), you can repeat your dose of the supplement 4 hours after your first dose. Heliocare can be purchased at [facility] or at [email or web address].    Melanoma ABCDEs  Melanoma is the most dangerous type of skin cancer, and is the leading cause of death from skin disease.  You are more likely to develop melanoma if you: Have light-colored skin, light-colored eyes, or red or blond hair Spend a lot of time in the sun Tan regularly, either outdoors or in a tanning bed Have had blistering sunburns, especially during childhood Have a close family member who has had a melanoma Have atypical moles or large birthmarks  Early detection of melanoma is key since treatment is typically straightforward and cure rates are extremely high if we catch it early.   The first sign of melanoma is often a change in a mole or a new dark spot.  The ABCDE system is a way of remembering the signs of melanoma.  A for asymmetry:  The two halves do not match. B for border:  The edges of the growth are irregular. C for color:  A mixture of colors are present instead of an even brown color. D for diameter:  Melanomas are usually (but not always) greater than 34mm - the size of a pencil eraser. E for evolution:  The spot keeps changing in size, shape, and color.  Please check your skin once per month between visits. You can use a small mirror in front and a large mirror behind you to keep an eye on the back side or your body.   If you see any new or changing  lesions before your next follow-up, please call to schedule a visit.  Please continue daily skin protection including broad spectrum sunscreen SPF 30+ to sun-exposed areas, reapplying every 2 hours as needed when you're outdoors.    If you have any questions or concerns for your doctor, please call our main line at [phone] and press option 4 to reach your doctor's medical assistant. If no one answers, please leave a voicemail as directed and we will return your call as soon as possible. Messages left after 4 pm will be answered the following business day.   You may also send [name] a message via [place]. We typically respond to MyChart messages within 1-2 business days.  For prescription refills, please ask your pharmacy to contact our office. Our fax number is [phone].  If you have an urgent issue when the clinic is closed that cannot wait until the next business day, you can page your doctor at the number below.    Please note that while we do our best to be available for urgent issues outside of office hours, we are not available 24/7.   If you have an urgent issue and are unable to reach [name], you may choose to seek medical care at your doctor's office, retail clinic, urgent care center, or emergency room.  If you have a medical emergency, please immediately call 911 or go to the emergency  department.  Pager Numbers  - Dr. Nehemiah Massed: [phone]  - Dr. Laurence Ferrari: [phone]  - Dr. Nicole Kindred: [phone]  In the event of inclement weather, please call our main line at [phone] for an update on the status of any delays or closures.  Dermatology Medication Tips: Please keep the boxes that topical medications come in in order to help keep track of the instructions about where and how to use these. Pharmacies typically print the medication instructions only on the boxes and not directly on the medication tubes.   If your medication is too expensive, please contact our office at  [phone] option 4 or send [name] a message through [place].   We are unable to tell what your co-pay for medications will be in advance as this is different depending on your insurance coverage. However, we may be able to find a substitute medication at lower cost or fill out paperwork to get insurance to cover a needed medication.   If a prior authorization is required to get your medication covered by your insurance company, please allow [name] 1-2 business days to complete this process.  Drug prices often vary depending on where the prescription is filled and some pharmacies may offer cheaper prices.  The website www.goodrx.com contains coupons for medications through different pharmacies. The prices here do not account for what the cost may be with help from insurance (it may be cheaper with your insurance), but the website can give you the price if you did not use any insurance.  - You can print the associated coupon and take it with your prescription to the pharmacy.  - You may also stop by our office during regular business hours and pick up a GoodRx coupon card.  - If you need your prescription sent electronically to a different pharmacy, notify our office through [facility] or by phone at [phone] option 4.

## 2020-12-31 DIAGNOSIS — K5730 Diverticulosis of large intestine without perforation or abscess without bleeding: Secondary | ICD-10-CM | POA: Diagnosis not present

## 2020-12-31 DIAGNOSIS — Z86010 Personal history of colonic polyps: Secondary | ICD-10-CM | POA: Diagnosis not present

## 2020-12-31 DIAGNOSIS — K635 Polyp of colon: Secondary | ICD-10-CM | POA: Diagnosis not present

## 2020-12-31 DIAGNOSIS — D124 Benign neoplasm of descending colon: Secondary | ICD-10-CM | POA: Diagnosis not present

## 2020-12-31 DIAGNOSIS — R197 Diarrhea, unspecified: Secondary | ICD-10-CM | POA: Diagnosis not present

## 2020-12-31 DIAGNOSIS — D120 Benign neoplasm of cecum: Secondary | ICD-10-CM | POA: Diagnosis not present

## 2020-12-31 DIAGNOSIS — D123 Benign neoplasm of transverse colon: Secondary | ICD-10-CM | POA: Diagnosis not present

## 2020-12-31 DIAGNOSIS — Z1211 Encounter for screening for malignant neoplasm of colon: Secondary | ICD-10-CM | POA: Diagnosis not present

## 2020-12-31 LAB — HM COLONOSCOPY

## 2021-01-06 DIAGNOSIS — R197 Diarrhea, unspecified: Secondary | ICD-10-CM | POA: Diagnosis not present

## 2021-01-06 DIAGNOSIS — R109 Unspecified abdominal pain: Secondary | ICD-10-CM | POA: Diagnosis not present

## 2021-01-06 DIAGNOSIS — E039 Hypothyroidism, unspecified: Secondary | ICD-10-CM | POA: Diagnosis not present

## 2021-01-06 DIAGNOSIS — I10 Essential (primary) hypertension: Secondary | ICD-10-CM | POA: Diagnosis not present

## 2021-01-13 ENCOUNTER — Encounter: Payer: Self-pay | Admitting: Internal Medicine

## 2021-01-13 ENCOUNTER — Other Ambulatory Visit: Payer: Self-pay

## 2021-01-13 ENCOUNTER — Ambulatory Visit: Payer: BC Managed Care – PPO | Admitting: Internal Medicine

## 2021-01-13 VITALS — BP 130/74 | HR 80 | Temp 97.8°F | Resp 16 | Ht 66.0 in | Wt 238.0 lb

## 2021-01-13 DIAGNOSIS — E538 Deficiency of other specified B group vitamins: Secondary | ICD-10-CM | POA: Diagnosis not present

## 2021-01-13 DIAGNOSIS — E782 Mixed hyperlipidemia: Secondary | ICD-10-CM

## 2021-01-13 DIAGNOSIS — E559 Vitamin D deficiency, unspecified: Secondary | ICD-10-CM | POA: Diagnosis not present

## 2021-01-13 DIAGNOSIS — E1165 Type 2 diabetes mellitus with hyperglycemia: Secondary | ICD-10-CM

## 2021-01-13 DIAGNOSIS — I10 Essential (primary) hypertension: Secondary | ICD-10-CM | POA: Diagnosis not present

## 2021-01-13 DIAGNOSIS — I480 Paroxysmal atrial fibrillation: Secondary | ICD-10-CM

## 2021-01-13 DIAGNOSIS — R197 Diarrhea, unspecified: Secondary | ICD-10-CM

## 2021-01-13 LAB — CBC WITH DIFFERENTIAL/PLATELET
Basophils Absolute: 0.1 10*3/uL (ref 0.0–0.1)
Basophils Relative: 0.7 % (ref 0.0–3.0)
Eosinophils Absolute: 0.1 10*3/uL (ref 0.0–0.7)
Eosinophils Relative: 1.7 % (ref 0.0–5.0)
HCT: 43.4 % (ref 36.0–46.0)
Hemoglobin: 14.8 g/dL (ref 12.0–15.0)
Lymphocytes Relative: 19.9 % (ref 12.0–46.0)
Lymphs Abs: 1.6 10*3/uL (ref 0.7–4.0)
MCHC: 34.2 g/dL (ref 30.0–36.0)
MCV: 93.3 fl (ref 78.0–100.0)
Monocytes Absolute: 0.5 10*3/uL (ref 0.1–1.0)
Monocytes Relative: 6.8 % (ref 3.0–12.0)
Neutro Abs: 5.6 10*3/uL (ref 1.4–7.7)
Neutrophils Relative %: 70.9 % (ref 43.0–77.0)
Platelets: 214 10*3/uL (ref 150.0–400.0)
RBC: 4.65 Mil/uL (ref 3.87–5.11)
RDW: 13.3 % (ref 11.5–15.5)
WBC: 7.9 10*3/uL (ref 4.0–10.5)

## 2021-01-13 LAB — LIPID PANEL
Cholesterol: 186 mg/dL (ref 0–200)
HDL: 65.4 mg/dL (ref >39.00)
LDL Cholesterol: 105 mg/dL — ABNORMAL HIGH (ref 0–99)
NonHDL: 120.29
Total CHOL/HDL Ratio: 3
Triglycerides: 77 mg/dL (ref 0.0–149.0)
VLDL: 15.4 mg/dL (ref 0.0–40.0)

## 2021-01-13 LAB — HEPATIC FUNCTION PANEL
ALT: 27 U/L (ref 0–35)
AST: 20 U/L (ref 0–37)
Albumin: 4.4 g/dL (ref 3.5–5.2)
Alkaline Phosphatase: 83 U/L (ref 39–117)
Bilirubin, Direct: 0.1 mg/dL (ref 0.0–0.3)
Total Bilirubin: 0.6 mg/dL (ref 0.2–1.2)
Total Protein: 7.2 g/dL (ref 6.0–8.3)

## 2021-01-13 LAB — BASIC METABOLIC PANEL
BUN: 13 mg/dL (ref 6–23)
CO2: 25 mEq/L (ref 19–32)
Calcium: 9.3 mg/dL (ref 8.4–10.5)
Chloride: 103 mEq/L (ref 96–112)
Creatinine, Ser: 0.65 mg/dL (ref 0.40–1.20)
GFR: 100.51 mL/min (ref >60.00)
Glucose, Bld: 89 mg/dL (ref 70–99)
Potassium: 4.1 mEq/L (ref 3.5–5.1)
Sodium: 137 mEq/L (ref 135–145)

## 2021-01-13 LAB — VITAMIN B12: Vitamin B-12: >1550 pg/mL — ABNORMAL HIGH (ref 211–911)

## 2021-01-13 LAB — HEMOGLOBIN A1C: Hgb A1c MFr Bld: 5.4 % (ref 4.6–6.5)

## 2021-01-13 LAB — VITAMIN D 25 HYDROXY (VIT D DEFICIENCY, FRACTURES): VITD: 25.03 ng/mL — ABNORMAL LOW (ref 30.00–100.00)

## 2021-01-13 LAB — MICROALBUMIN / CREATININE URINE RATIO
Creatinine,U: 118.3 mg/dL
Microalb Creat Ratio: 1.3 mg/g (ref 0.0–30.0)
Microalb, Ur: 1.6 mg/dL (ref 0.0–1.9)

## 2021-01-13 NOTE — Progress Notes (Signed)
Patient ID: Joy Houston, female   DOB: [birth year], 55 y.o.   MRN: [id number]   Subjective:    Patient ID: Joy Houston, female    DOB: 08-25-1965, 55 y.o.   MRN: [id number]  This visit occurred during the SARS-CoV-2 public health emergency.  Safety protocols were in place, including screening questions prior to the visit, additional usage of staff PPE, and extensive cleaning of exam room while observing appropriate contact time as indicated for disinfecting solutions.   Patient here for a scheduled follow up.   Marland Kitchen   HPI Her main complaint today is that of persistent diarrhea.  Has been evaluated by GI.  Just had colonoscopy.  F/u stool studies negative.  GI recommended starting colestid.  She desires not to start colestid given concern over a possible reaction to the medication.  She is taking various "natural supplements".  Wants to hold on starting prescription medications.  Still having some RUQ discomfort - intermittent.  W/up has been unrevealing.  Eating.  Overall feels better.  Heart rate/rhythm - improved.  Breathing overall better.  Able to be more active.  Walking more.  Discussed diet and exercise.     Past Medical History:  Diagnosis Date   Anemia    Angio-edema    Atrial fibrillation ([place])    Complication of anesthesia    Diabetes mellitus without complication ([place])    diet controlled   Dysplastic nevus 06/27/2018   Left upper back. Moderate atypia, limited margins free.    Eczema    Epstein Barr infection    Family history of anesthesia complication    Mother - confusion   Fatty liver    Heart murmur    Slight - "nothing to worry about"   Hemorrhoid    Hypertension    Hypothyroidism    PONV (postoperative nausea and vomiting)    "only with c-sections"   Sleep apnea    mild   Past Surgical History:  Procedure Laterality Date   CESAREAN SECTION     CHOLECYSTECTOMY N/A 09/07/2018   Procedure: LAPAROSCOPIC CHOLECYSTECTOMY;  Surgeon: Herbert Pun,  MD;  Location: [facility] ORS;  Service: General;  Laterality: N/A;   FINGER SURGERY     pin to 3rd finger Right hand   LUMBAR LAMINECTOMY/DECOMPRESSION MICRODISCECTOMY  12/15/2011   Procedure: LUMBAR LAMINECTOMY/DECOMPRESSION MICRODISCECTOMY 1 LEVEL;  Surgeon: Ophelia Charter, MD;  Location: [facility];  Service: Neurosurgery;  Laterality: Right;  RIGHT Lumbar four-Five diskectomy   LUMBAR LAMINECTOMY/DECOMPRESSION MICRODISCECTOMY N/A 11/04/2019   Procedure: MICRODISCECTOMY LEFT LUMBAR FIVE SACRAL ONE;  Surgeon: Newman Pies, MD;  Location: [place];  Service: Neurosurgery;  Laterality: N/A;  3C   MICRODISCECTOMY LUMBAR     L4-5   WISDOM TOOTH EXTRACTION     Family History  Problem Relation Age of Onset   GER disease Mother    Diabetes Father    Cancer Father        Melanoma, Prostate   Heart disease Father    Heart disease Maternal Grandfather    Diabetes Paternal Grandfather    Breast cancer Sister 55   Social History   Socioeconomic History   Marital status: Married    Spouse name: Not on file   Number of children: 2   Years of education: Not on file   Highest education level: Not on file  Occupational History    Employer: [employer]  Tobacco Use   Smoking status: Never   Smokeless tobacco: Never  Vaping Use  Vaping Use: Never used  Substance and Sexual Activity   Alcohol use: Not Currently    Alcohol/week: 0.0 standard drinks   Drug use: No   Sexual activity: Not on file  Other Topics Concern   Not on file  Social History Narrative   Nurse    Married   Social Determinants of Health   Financial Resource Strain: Not on file  Food Insecurity: Not on file  Transportation Needs: Not on file  Physical Activity: Not on file  Stress: Not on file  Social Connections: Not on file     Review of Systems  Constitutional:  Negative for appetite change and unexpected weight change.  HENT:  Negative for congestion and sinus pressure.   Respiratory:  Negative  for cough and chest tightness.        Breathing improved.    Cardiovascular:  Negative for chest pain and leg swelling.  Gastrointestinal:  Positive for diarrhea. Negative for nausea and vomiting.       RUQ discomfort as outlined.    Genitourinary:  Negative for difficulty urinating and dysuria.  Musculoskeletal:  Negative for joint swelling and myalgias.  Skin:  Negative for color change and rash.  Neurological:  Negative for dizziness, light-headedness and headaches.  Psychiatric/Behavioral:  Negative for agitation and dysphoric mood.       Objective:     BP 130/74   Pulse 80   Temp 97.8 F (36.6 C)   Resp 16   Ht 5' 6"  (1.676 m)   Wt 238 lb (108 kg)   SpO2 98%   BMI 38.41 kg/m  Wt Readings from Last 3 Encounters:  01/13/21 238 lb (108 kg)  10/29/20 239 lb 9.6 oz (108.7 kg)  08/07/20 240 lb (108.9 kg)    Physical Exam Vitals reviewed.  Constitutional:      General: She is not in acute distress.    Appearance: Normal appearance.  HENT:     Head: Normocephalic and atraumatic.     Right Ear: External ear normal.     Left Ear: External ear normal.  Eyes:     General: No scleral icterus.       Right eye: No discharge.        Left eye: No discharge.     Conjunctiva/sclera: Conjunctivae normal.  Neck:     Thyroid: No thyromegaly.  Cardiovascular:     Rate and Rhythm: Normal rate and regular rhythm.  Pulmonary:     Effort: No respiratory distress.     Breath sounds: Normal breath sounds. No wheezing.  Abdominal:     General: Bowel sounds are normal.     Palpations: Abdomen is soft.     Tenderness: There is no abdominal tenderness.  Musculoskeletal:        General: No swelling or tenderness.     Cervical back: Neck supple. No tenderness.  Lymphadenopathy:     Cervical: No cervical adenopathy.  Skin:    Findings: No erythema or rash.  Neurological:     Mental Status: She is alert.  Psychiatric:        Mood and Affect: Mood normal.        Behavior: Behavior  normal.     Outpatient Encounter Medications as of 01/13/2021  Medication Sig   Ascorbic Acid (VITAMIN C PO) Take by mouth.   EPINEPHrine (EPIPEN 2-PAK) 0.3 mg/0.3 mL IJ SOAJ injection Inject 0.3 mg into the muscle as needed for anaphylaxis.   hydrocortisone 2.5 % ointment For up  to 1-2 weeks as needed.   ketoconazole (NIZORAL) 2 % cream Apply to areas of rash twice daily as needed.   metoprolol succinate (TOPROL-XL) 25 MG 24 hr tablet Take 12.5 mg by mouth daily.   Syringe/Needle, Disp, (SYRINGE 3CC/25GX1") 25G X 1" 3 ML MISC Use as directed with b12 injections.   VITAMIN D PO Take by mouth.   No facility-administered encounter medications on file as of 01/13/2021.     Lab Results  Component Value Date   WBC 7.9 01/13/2021   HGB 14.8 01/13/2021   HCT 43.4 01/13/2021   PLT 214.0 01/13/2021   GLUCOSE 89 01/13/2021   CHOL 186 01/13/2021   TRIG 77.0 01/13/2021   HDL 65.40 01/13/2021   LDLDIRECT 145.4 03/01/2013   LDLCALC 105 (H) 01/13/2021   ALT 27 01/13/2021   AST 20 01/13/2021   NA 137 01/13/2021   K 4.1 01/13/2021   CL 103 01/13/2021   CREATININE 0.65 01/13/2021   BUN 13 01/13/2021   CO2 25 01/13/2021   TSH 4.41 04/17/2020   HGBA1C 5.4 01/13/2021   MICROALBUR 1.6 01/13/2021       Assessment & Plan:   Problem List Items Addressed This Visit     B12 deficiency    Continues on B12 injections.  Recheck b12 level today.       Relevant Orders   Vitamin B12 (Completed)   Diarrhea    Persistent issue.  S/p cholecystectomy.  Saw GI.  Just had colonoscopy.  Stool studies negative.  Discussed starting colestid.  Prefer not to start at this time.  Follow.        Hyperlipidemia, mixed    Low cholesterol diet and exercise.  Follow lipid panel.       Relevant Orders   Lipid panel (Completed)   Hepatic function panel (Completed)   CBC with Differential/Platelet (Completed)   Hypertension    Blood pressure above goal.  Have her spot check her pressure.  Hold on making  adjustments in medication.  Follow pressures.  Follow metabolic panel.       Relevant Orders   Basic metabolic panel (Completed)   Paroxysmal A-fib ([place])    S/p ablation.  Off xarelto.  Afib/symptoms improved.  On 1/2 metoprolol now.  Follow pressures.  Doing better. Walking.  Breathing better.       Type 2 diabetes mellitus with hyperglycemia ([facility]) - Primary    Continue low carb diet and exercise.  Follow met b and a1c.       Relevant Orders   Hemoglobin A1c (Completed)   Microalbumin / creatinine urine ratio (Completed)   Vitamin D deficiency    Recheck vitamin D level today.       Relevant Orders   VITAMIN D 25 Hydroxy (Vit-D Deficiency, Fractures) (Completed)     Einar Pheasant, MD

## 2021-01-15 ENCOUNTER — Encounter: Payer: Self-pay | Admitting: Internal Medicine

## 2021-01-15 NOTE — Assessment & Plan Note (Signed)
Blood pressure above goal.  Have her spot check her pressure.  Hold on making adjustments in medication.  Follow pressures.  Follow metabolic panel.

## 2021-01-15 NOTE — Assessment & Plan Note (Signed)
Low cholesterol diet and exercise.  Follow lipid panel.   

## 2021-01-15 NOTE — Assessment & Plan Note (Signed)
S/p ablation.  Off xarelto.  Afib/symptoms improved.  On 1/2 metoprolol now.  Follow pressures.  Doing better. Walking.  Breathing better.

## 2021-01-15 NOTE — Assessment & Plan Note (Signed)
Continue low carb diet and exercise.  Follow met b and a1c.  

## 2021-01-15 NOTE — Assessment & Plan Note (Signed)
Persistent issue.  S/p cholecystectomy.  Saw GI.  Just had colonoscopy.  Stool studies negative.  Discussed starting colestid.  Prefer not to start at this time.  Follow.

## 2021-01-15 NOTE — Assessment & Plan Note (Signed)
Continues on B12 injections.  Recheck b12 level today.

## 2021-01-15 NOTE — Assessment & Plan Note (Signed)
Recheck vitamin  D level today.  

## 2021-01-26 ENCOUNTER — Other Ambulatory Visit: Payer: Self-pay

## 2021-01-26 ENCOUNTER — Other Ambulatory Visit: Payer: Self-pay | Admitting: Internal Medicine

## 2021-01-26 ENCOUNTER — Ambulatory Visit: Payer: BC Managed Care – PPO | Admitting: Dermatology

## 2021-01-26 DIAGNOSIS — L720 Epidermal cyst: Secondary | ICD-10-CM

## 2021-01-26 DIAGNOSIS — D485 Neoplasm of uncertain behavior of skin: Secondary | ICD-10-CM

## 2021-01-26 DIAGNOSIS — Z1231 Encounter for screening mammogram for malignant neoplasm of breast: Secondary | ICD-10-CM

## 2021-01-26 MED ORDER — MUPIROCIN 2 % EX OINT
1.0000 | TOPICAL_OINTMENT | Freq: Every day | CUTANEOUS | 0 refills | Status: DC
Start: 2021-01-26 — End: 2021-03-18

## 2021-01-26 NOTE — Progress Notes (Signed)
   Follow-Up Visit   Subjective  Joy Houston is a 55 y.o. female who presents for the following: Procedure (Patient here today for excision of cyst at right axilla. ).  Patient also with a skin tag at groin. Advised patient we can treat at suture removal.   The following portions of the chart were reviewed this encounter and updated as appropriate:   Tobacco  Allergies  Meds  Problems  Med Hx  Surg Hx  Fam Hx      Review of Systems:  No other skin or systemic complaints except as noted in HPI or Assessment and Plan.  Objective  Well appearing patient in no apparent distress; mood and affect are within normal limits.  A focused examination was performed including right axilla. Relevant physical exam findings are noted in the Assessment and Plan.  Right Axilla 1.1 cm subcutaneous nodule   Assessment & Plan  Neoplasm of uncertain behavior of skin Right Axilla  Skin excision  Lesion length (cm):  1.1 Margin per side (cm):  0 Total excision diameter (cm):  1.1 Informed consent: discussed and consent obtained   Timeout: patient name, date of birth, surgical site, and procedure verified   Procedure prep:  Patient was prepped and draped in usual sterile fashion Prep type:  Chlorhexidine Anesthesia: the lesion was anesthetized in a standard fashion   Local anesthetic: 4 cc 0.25% bupivicaine. Instrument used comment:  15c Hemostasis achieved with: suture, pressure and electrodesiccation   Outcome: patient tolerated procedure well with no complications   Post-procedure details: wound care instructions given   Additional details:  Mupirocin and a pressure dressing applied  Skin repair Complexity:  Intermediate Final length (cm):  1.4 Informed consent: discussed and consent obtained   Timeout: patient name, date of birth, surgical site, and procedure verified   Procedure prep:  Patient was prepped and draped in usual sterile fashion Prep type:   Chlorhexidine Anesthesia: the lesion was anesthetized in a standard fashion   Anesthetic:  1% lidocaine w/ epinephrine 1-100,000 local infiltration Reason for type of repair: reduce tension to allow closure, reduce the risk of dehiscence, infection, and necrosis and enhance both functionality and cosmetic results   Undermining: edges undermined and area extensively undermined   Subcutaneous layers (deep stitches):  Suture size:  4-0 Suture type: Vicryl (polyglactin 910)   Stitches:  Buried vertical mattress Fine/surface layer approximation (top stitches):  Suture size:  4-0 Suture type: Prolene (polypropylene)   Stitches: simple running   Suture removal (days):  7 Hemostasis achieved with: suture, pressure and electrodesiccation Outcome: patient tolerated procedure well with no complications   Post-procedure details: sterile dressing applied and wound care instructions given   Dressing type: pressure dressing (Mupirocin ointment)   Additional details:  Mupirocin and a pressure bandage applied   mupirocin ointment (BACTROBAN) 2 % Apply 1 application topically daily. With dressing changes  Specimen 1 - Surgical pathology Differential Diagnosis: Cyst vs Other  Check Margins: No 1.1 cm subcutaneous nodule  Return in about 1 week (around 02/02/2021) for Suture Removal, skin tag removal.  Graciella Belton, RMA, am acting as scribe for Forest Gleason, MD .  Documentation: I have reviewed the above documentation for accuracy and completeness, and I agree with the above.  Forest Gleason, MD

## 2021-01-26 NOTE — Patient Instructions (Addendum)

## 2021-01-28 ENCOUNTER — Encounter: Payer: Self-pay | Admitting: Dermatology

## 2021-01-29 DIAGNOSIS — Z6839 Body mass index (BMI) 39.0-39.9, adult: Secondary | ICD-10-CM | POA: Diagnosis not present

## 2021-01-29 DIAGNOSIS — R197 Diarrhea, unspecified: Secondary | ICD-10-CM | POA: Diagnosis not present

## 2021-02-02 ENCOUNTER — Other Ambulatory Visit: Payer: Self-pay

## 2021-02-02 ENCOUNTER — Encounter: Payer: Self-pay | Admitting: Dermatology

## 2021-02-02 ENCOUNTER — Ambulatory Visit (INDEPENDENT_AMBULATORY_CARE_PROVIDER_SITE_OTHER): Payer: BC Managed Care – PPO | Admitting: Dermatology

## 2021-02-02 DIAGNOSIS — D485 Neoplasm of uncertain behavior of skin: Secondary | ICD-10-CM | POA: Diagnosis not present

## 2021-02-02 DIAGNOSIS — D492 Neoplasm of unspecified behavior of bone, soft tissue, and skin: Secondary | ICD-10-CM

## 2021-02-02 DIAGNOSIS — L905 Scar conditions and fibrosis of skin: Secondary | ICD-10-CM

## 2021-02-02 DIAGNOSIS — L918 Other hypertrophic disorders of the skin: Secondary | ICD-10-CM | POA: Diagnosis not present

## 2021-02-02 DIAGNOSIS — L720 Epidermal cyst: Secondary | ICD-10-CM

## 2021-02-02 NOTE — Progress Notes (Signed)
Follow-Up Visit   Subjective  Joy Houston is a 55 y.o. female who presents for the following: Suture / Staple Removal (1 week f/u right axilla biopsy proven cyst ).  The patient has spots, moles and lesions to be evaluated, some may be new or changing and the patient has concerns that these could be cancer.     The following portions of the chart were reviewed this encounter and updated as appropriate:   Tobacco  Allergies  Meds  Problems  Med Hx  Surg Hx  Fam Hx      Review of Systems:  No other skin or systemic complaints except as noted in HPI or Assessment and Plan.  Objective  Well appearing patient in no apparent distress; mood and affect are within normal limits.  A focused examination was performed including right axilla, inner thigs. Relevant physical exam findings are noted in the Assessment and Plan.  right inner thigh 0.5 cm erythematous fleshy pedunculated papule   Left Inner thigh 0.5 cm erythematous fleshy pedunculated papule   Right Axilla Incision site is clean, dry and intact   left labia majora 0.6 cm Subcutaneous nodule.    Assessment & Plan  Neoplasm of skin (2) right inner thigh  Epidermal / dermal shaving  Lesion diameter (cm):  0.5 Informed consent: discussed and consent obtained   Timeout: patient name, date of birth, surgical site, and procedure verified   Patient was prepped and draped in usual sterile fashion: area prepped with alcohol. Anesthesia: the lesion was anesthetized in a standard fashion   Anesthetic:  1% lidocaine w/ epinephrine 1-100,000 local infiltration Instrument used: flexible razor blade   Hemostasis achieved with: pressure, aluminum chloride and electrodesiccation   Outcome: patient tolerated procedure well   Post-procedure details: wound care instructions given   Post-procedure details comment:  Ointment and a small bandage applied  Specimen 1 - Surgical pathology Differential Diagnosis: R/O Irritated  skin tag   Check Margins: No  Left Inner thigh  Epidermal / dermal shaving  Lesion diameter (cm):  0.5 Informed consent: discussed and consent obtained   Timeout: patient name, date of birth, surgical site, and procedure verified   Procedure prep:  Patient was prepped and draped in usual sterile fashion Prep type:  Isopropyl alcohol Anesthesia: the lesion was anesthetized in a standard fashion   Anesthetic:  1% lidocaine w/ epinephrine 1-100,000 buffered w/ 8.4% NaHCO3 Hemostasis achieved with: pressure, aluminum chloride and electrodesiccation   Outcome: patient tolerated procedure well   Post-procedure details: sterile dressing applied and wound care instructions given   Dressing type: bandage and petrolatum    Specimen 2 - Surgical pathology Differential Diagnosis: R/O Irritated skin tag   Check Margins: No  Scar Right Axilla  Biopsy results discussed biopsy proven cyst   Encounter for Removal of Sutures - Incision site at the right axilla  is clean, dry and intact - Wound cleansed, sutures removed, wound cleansed - Continue mupirocin ointment 2-3 times per day for another 4 daysd - Discussed pathology results showing Epidermal cyst   - Scars remodel for a full year. - Patient can apply over-the-counter silicone scar cream each night to help with scar remodeling if desired. - Patient advised to call with any concerns or if they notice any new or changing lesions.   Epidermal inclusion cyst left labia majora  Benign-appearing. Exam most consistent with an epidermal inclusioncyst. Discussed that a cyst is a benign growth that can grow over time and sometimes get  irritated or inflamed. Recommend observation if it is not bothersome. Discussed option of surgical excision to remove it if it is growing, symptomatic, or other changes noted. Please call for new or changing lesions so they can be evaluated.  Pt will call if she would like to schedule surgery in the future     Return if symptoms worsen or fail to improve.  I, Marye Round, CMA, am acting as scribe for Forest Gleason, MD .   Documentation: I have reviewed the above documentation for accuracy and completeness, and I agree with the above.  Forest Gleason, MD

## 2021-02-02 NOTE — Patient Instructions (Signed)

## 2021-02-09 ENCOUNTER — Telehealth: Payer: Self-pay

## 2021-02-09 NOTE — Telephone Encounter (Signed)
Patient advised bx results showed benign skin tags.Joy Houston

## 2021-03-18 ENCOUNTER — Ambulatory Visit: Payer: BC Managed Care – PPO | Admitting: Internal Medicine

## 2021-03-18 ENCOUNTER — Other Ambulatory Visit: Payer: Self-pay

## 2021-03-18 VITALS — BP 128/74 | HR 88 | Temp 97.9°F | Resp 16 | Ht 66.0 in | Wt 246.0 lb

## 2021-03-18 DIAGNOSIS — E538 Deficiency of other specified B group vitamins: Secondary | ICD-10-CM

## 2021-03-18 DIAGNOSIS — E1165 Type 2 diabetes mellitus with hyperglycemia: Secondary | ICD-10-CM | POA: Diagnosis not present

## 2021-03-18 DIAGNOSIS — K760 Fatty (change of) liver, not elsewhere classified: Secondary | ICD-10-CM

## 2021-03-18 DIAGNOSIS — R197 Diarrhea, unspecified: Secondary | ICD-10-CM

## 2021-03-18 DIAGNOSIS — Z114 Encounter for screening for human immunodeficiency virus [HIV]: Secondary | ICD-10-CM

## 2021-03-18 DIAGNOSIS — E782 Mixed hyperlipidemia: Secondary | ICD-10-CM

## 2021-03-18 DIAGNOSIS — I10 Essential (primary) hypertension: Secondary | ICD-10-CM

## 2021-03-18 DIAGNOSIS — R0602 Shortness of breath: Secondary | ICD-10-CM

## 2021-03-18 DIAGNOSIS — Z1159 Encounter for screening for other viral diseases: Secondary | ICD-10-CM | POA: Diagnosis not present

## 2021-03-18 DIAGNOSIS — I480 Paroxysmal atrial fibrillation: Secondary | ICD-10-CM

## 2021-03-18 DIAGNOSIS — R319 Hematuria, unspecified: Secondary | ICD-10-CM

## 2021-03-18 NOTE — Progress Notes (Signed)
Patient ID: Joy Houston, female   DOB: [birth year], 56 y.o.   MRN: [id number]   Subjective:    Patient ID: Joy Houston, female    DOB: 12/21/1965, 56 y.o.   MRN: [id number]  This visit occurred during the SARS-CoV-2 public health emergency.  Safety protocols were in place, including screening questions prior to the visit, additional usage of staff PPE, and extensive cleaning of exam room while observing appropriate contact time as indicated for disinfecting solutions.   Patient here for a scheduled follow up.     HPI Here to follow up regarding afib, blood pressure and blood sugar.  Seeing GI - for persistent diarrhea. Opted not to take colestid.  F/u GI pathogen panel was negative.  TSH and T4 normal.  Colonoscopy negative for microscopic colitis.  Reports bowels are better and are manageable at this time.  She is eating.  No nausea or vomiting.  No chest pain.  Breathing stable.  No increased cough or congestion.     Past Medical History:  Diagnosis Date   Anemia    Angio-edema    Atrial fibrillation ([place])    Complication of anesthesia    Diabetes mellitus without complication ([place])    diet controlled   Dysplastic nevus 06/27/2018   Left upper back. Moderate atypia, limited margins free.    Eczema    Epstein Barr infection    Family history of anesthesia complication    Mother - confusion   Fatty liver    Heart murmur    Slight - "nothing to worry about"   Hemorrhoid    Hypertension    Hypothyroidism    PONV (postoperative nausea and vomiting)    "only with c-sections"   Sleep apnea    mild   Past Surgical History:  Procedure Laterality Date   CESAREAN SECTION     CHOLECYSTECTOMY N/A 09/07/2018   Procedure: LAPAROSCOPIC CHOLECYSTECTOMY;  Surgeon: Herbert Pun, MD;  Location: [facility] ORS;  Service: General;  Laterality: N/A;   FINGER SURGERY     pin to 3rd finger Right hand   LUMBAR LAMINECTOMY/DECOMPRESSION MICRODISCECTOMY  12/15/2011   Procedure: LUMBAR  LAMINECTOMY/DECOMPRESSION MICRODISCECTOMY 1 LEVEL;  Surgeon: Ophelia Charter, MD;  Location: [facility];  Service: Neurosurgery;  Laterality: Right;  RIGHT Lumbar four-Five diskectomy   LUMBAR LAMINECTOMY/DECOMPRESSION MICRODISCECTOMY N/A 11/04/2019   Procedure: MICRODISCECTOMY LEFT LUMBAR FIVE SACRAL ONE;  Surgeon: Newman Pies, MD;  Location: [facility];  Service: Neurosurgery;  Laterality: N/A;  3C   MICRODISCECTOMY LUMBAR     L4-5   WISDOM TOOTH EXTRACTION     Family History  Problem Relation Age of Onset   GER disease Mother    Diabetes Father    Cancer Father        Melanoma, Prostate   Heart disease Father    Heart disease Maternal Grandfather    Diabetes Paternal Grandfather    Breast cancer Sister 64   Social History   Socioeconomic History   Marital status: Married    Spouse name: Not on file   Number of children: 2   Years of education: Not on file   Highest education level: Not on file  Occupational History    Employer: [employer]  Tobacco Use   Smoking status: Never   Smokeless tobacco: Never  Vaping Use   Vaping Use: Never used  Substance and Sexual Activity   Alcohol use: Not Currently    Alcohol/week: 0.0 standard drinks   Drug use: No  Sexual activity: Not on file  Other Topics Concern   Not on file  Social History Narrative   Nurse    Married   Social Determinants of Health   Financial Resource Strain: Not on file  Food Insecurity: Not on file  Transportation Needs: Not on file  Physical Activity: Not on file  Stress: Not on file  Social Connections: Not on file     Review of Systems  Constitutional:  Negative for appetite change and unexpected weight change.  HENT:  Negative for congestion and sinus pressure.   Respiratory:  Negative for cough, chest tightness and shortness of breath.   Cardiovascular:  Negative for chest pain, palpitations and leg swelling.  Gastrointestinal:  Negative for abdominal pain, diarrhea, nausea and  vomiting.  Genitourinary:  Negative for difficulty urinating and dysuria.  Musculoskeletal:  Negative for joint swelling and myalgias.  Skin:  Negative for color change and rash.  Neurological:  Negative for dizziness, light-headedness and headaches.  Psychiatric/Behavioral:  Negative for agitation and dysphoric mood.       Objective:     BP 128/74    Pulse 88    Temp 97.9 F (36.6 C)    Resp 16    Ht 5' 6"  (1.676 m)    Wt 246 lb (111.6 kg)    SpO2 98%    BMI 39.71 kg/m  Wt Readings from Last 3 Encounters:  03/18/21 246 lb (111.6 kg)  01/13/21 238 lb (108 kg)  10/29/20 239 lb 9.6 oz (108.7 kg)    Physical Exam Vitals reviewed.  Constitutional:      General: She is not in acute distress.    Appearance: Normal appearance.  HENT:     Head: Normocephalic and atraumatic.     Right Ear: External ear normal.     Left Ear: External ear normal.  Eyes:     General: No scleral icterus.       Right eye: No discharge.        Left eye: No discharge.     Conjunctiva/sclera: Conjunctivae normal.  Neck:     Thyroid: No thyromegaly.  Cardiovascular:     Rate and Rhythm: Normal rate and regular rhythm.  Pulmonary:     Effort: No respiratory distress.     Breath sounds: Normal breath sounds. No wheezing.  Abdominal:     General: Bowel sounds are normal.     Palpations: Abdomen is soft.     Tenderness: There is no abdominal tenderness.  Musculoskeletal:        General: No swelling or tenderness.     Cervical back: Neck supple. No tenderness.  Lymphadenopathy:     Cervical: No cervical adenopathy.  Skin:    Findings: No erythema or rash.  Neurological:     Mental Status: She is alert.  Psychiatric:        Mood and Affect: Mood normal.        Behavior: Behavior normal.     Outpatient Encounter Medications as of 03/18/2021  Medication Sig   Ascorbic Acid (VITAMIN C PO) Take by mouth.   EPINEPHrine (EPIPEN 2-PAK) 0.3 mg/0.3 mL IJ SOAJ injection Inject 0.3 mg into the muscle as  needed for anaphylaxis.   hydrocortisone 2.5 % ointment For up to 1-2 weeks as needed.   ketoconazole (NIZORAL) 2 % cream Apply to areas of rash twice daily as needed.   metoprolol succinate (TOPROL-XL) 25 MG 24 hr tablet Take 12.5 mg by mouth daily.   Syringe/Needle,  Disp, (SYRINGE 3CC/25GX1") 25G X 1" 3 ML MISC Use as directed with b12 injections.   VITAMIN D PO Take by mouth.   [DISCONTINUED] mupirocin ointment (BACTROBAN) 2 % Apply 1 application topically daily. With dressing changes   No facility-administered encounter medications on file as of 03/18/2021.     Lab Results  Component Value Date   WBC 7.9 01/13/2021   HGB 14.8 01/13/2021   HCT 43.4 01/13/2021   PLT 214.0 01/13/2021   GLUCOSE 89 01/13/2021   CHOL 186 01/13/2021   TRIG 77.0 01/13/2021   HDL 65.40 01/13/2021   LDLDIRECT 145.4 03/01/2013   LDLCALC 105 (H) 01/13/2021   ALT 27 01/13/2021   AST 20 01/13/2021   NA 137 01/13/2021   K 4.1 01/13/2021   CL 103 01/13/2021   CREATININE 0.65 01/13/2021   BUN 13 01/13/2021   CO2 25 01/13/2021   TSH 4.41 04/17/2020   HGBA1C 5.4 01/13/2021   MICROALBUR 1.6 01/13/2021       Assessment & Plan:   Problem List Items Addressed This Visit     B12 deficiency    Continue B12 injections.       Diarrhea    Persistent issue.  S/p cholecystectomy.  Saw GI.  Just had colonoscopy.  Stool studies negative.  Discussed starting colestid.  Prefer not to start at this time.  Is better.  Follow.        Fatty liver    Diet, exercise and weight loss.  Follow liver panel.       Hematuria    Has seen Dr Bernardo Heater previously.  Recheck urine with next labs.        Relevant Orders   Urinalysis, Routine w reflex microscopic   Hyperlipidemia, mixed - Primary    Low cholesterol diet and exercise.  Follow lipid panel.       Relevant Orders   Lipid panel   Hepatic function panel   Hypertension    Blood pressure as outlined. Have her spot check her pressure.  Follow pressures.   Follow metabolic panel.       Relevant Orders   Basic metabolic panel   Paroxysmal A-fib ([place])    S/p ablation.  Off xarelto.  Afib/symptoms improved.  On 1/2 metoprolol. Follow pressures.  Breathing better.       SOB (shortness of breath)    Has afib.  Seeing cardiology.  Breathing overall has been better.  EKG - SR with no acute ischemic changes.  Keep f/u with cardiology.        Relevant Orders   EKG 12-Lead (Completed)   Type 2 diabetes mellitus with hyperglycemia (HCC)    Continue low carb diet and exercise.  Follow met b and a1c.       Relevant Orders   Hemoglobin A1c   Other Visit Diagnoses     Encounter for hepatitis C screening test for low risk patient       Relevant Orders   Hepatitis C antibody   Screening for HIV without presence of risk factors       Relevant Orders   HIV Antibody (routine testing w rflx)        Einar Pheasant, MD

## 2021-03-22 DIAGNOSIS — M545 Low back pain, unspecified: Secondary | ICD-10-CM | POA: Diagnosis not present

## 2021-03-22 DIAGNOSIS — I10 Essential (primary) hypertension: Secondary | ICD-10-CM | POA: Diagnosis not present

## 2021-03-22 DIAGNOSIS — B379 Candidiasis, unspecified: Secondary | ICD-10-CM | POA: Diagnosis not present

## 2021-03-22 DIAGNOSIS — R197 Diarrhea, unspecified: Secondary | ICD-10-CM | POA: Diagnosis not present

## 2021-03-28 ENCOUNTER — Encounter: Payer: Self-pay | Admitting: Internal Medicine

## 2021-03-28 NOTE — Assessment & Plan Note (Signed)
Diet, exercise and weight loss.  Follow liver panel.

## 2021-03-28 NOTE — Assessment & Plan Note (Signed)
Continue B12 injections.   

## 2021-03-28 NOTE — Assessment & Plan Note (Signed)
Has seen Dr Bernardo Heater previously.  Recheck urine with next labs.

## 2021-03-28 NOTE — Assessment & Plan Note (Signed)
Blood pressure as outlined. Have her spot check her pressure.  Follow pressures.  Follow metabolic panel.

## 2021-03-28 NOTE — Assessment & Plan Note (Signed)
Has afib.  Seeing cardiology.  Breathing overall has been better.  EKG - SR with no acute ischemic changes.  Keep f/u with cardiology.

## 2021-03-28 NOTE — Assessment & Plan Note (Signed)
S/p ablation.  Off xarelto.  Afib/symptoms improved.  On 1/2 metoprolol. Follow pressures.  Breathing better.

## 2021-03-28 NOTE — Assessment & Plan Note (Signed)
Persistent issue.  S/p cholecystectomy.  Saw GI.  Just had colonoscopy.  Stool studies negative.  Discussed starting colestid.  Prefer not to start at this time.  Is better.  Follow.

## 2021-03-28 NOTE — Assessment & Plan Note (Signed)
Continue low carb diet and exercise.  Follow met b and a1c.  

## 2021-03-28 NOTE — Assessment & Plan Note (Signed)
Low cholesterol diet and exercise.  Follow lipid panel.   

## 2021-04-12 ENCOUNTER — Ambulatory Visit
Admission: RE | Admit: 2021-04-12 | Discharge: 2021-04-12 | Disposition: A | Discharge status: Home / Self Care | Payer: BC Managed Care – PPO | Source: Ambulatory Visit | Attending: Internal Medicine | Admitting: Internal Medicine

## 2021-04-12 ENCOUNTER — Other Ambulatory Visit: Payer: Self-pay

## 2021-04-12 DIAGNOSIS — Z1231 Encounter for screening mammogram for malignant neoplasm of breast: Secondary | ICD-10-CM | POA: Insufficient documentation

## 2021-04-12 IMAGING — CR DG CHEST 2V
2 series · 2 of 2 positions shown · non-contrast
Comparison: 08/16/2018

CLINICAL DATA: Shortness of breath.  Palpitations

EXAM:
CHEST - 2 VIEW

[chest pa]
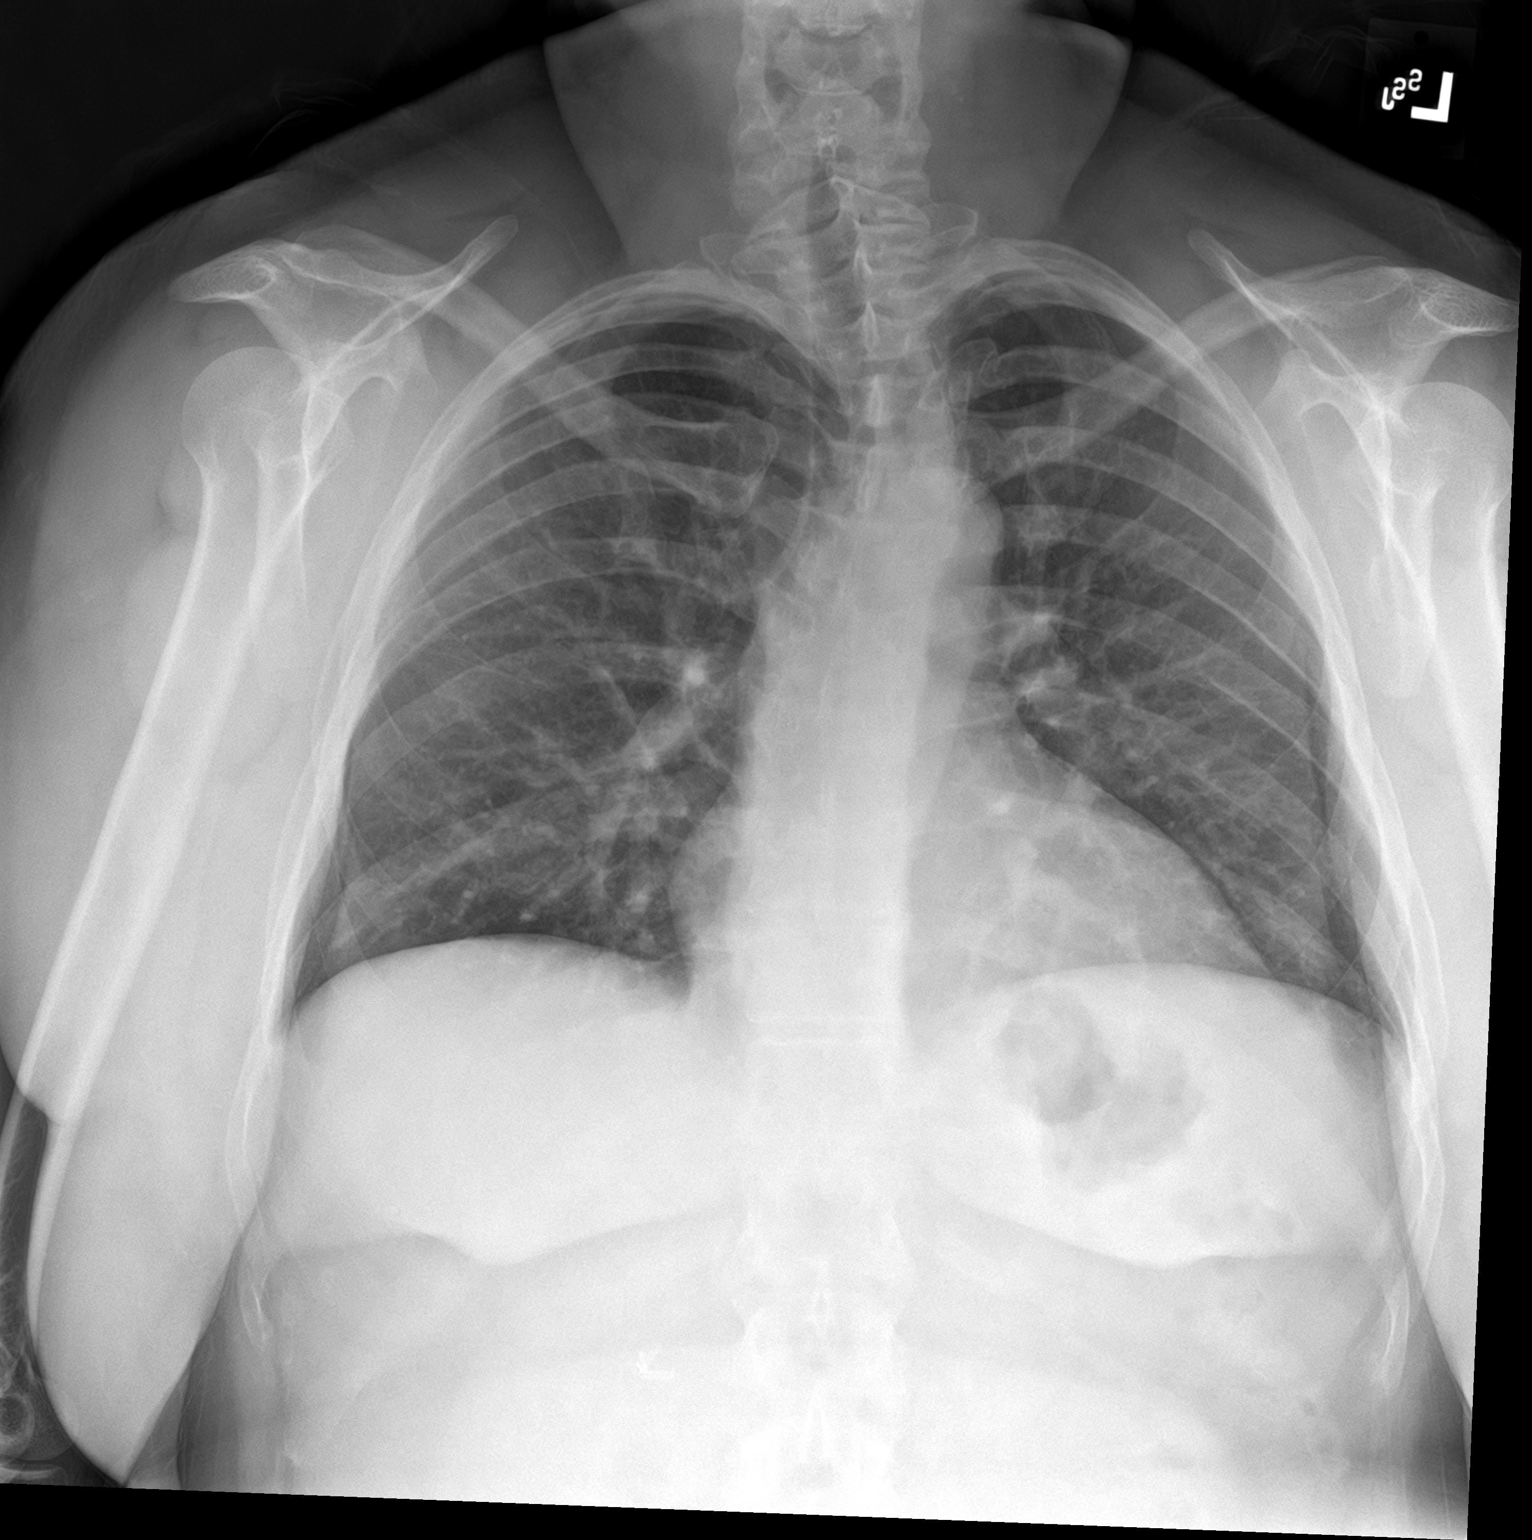

[chest lat]
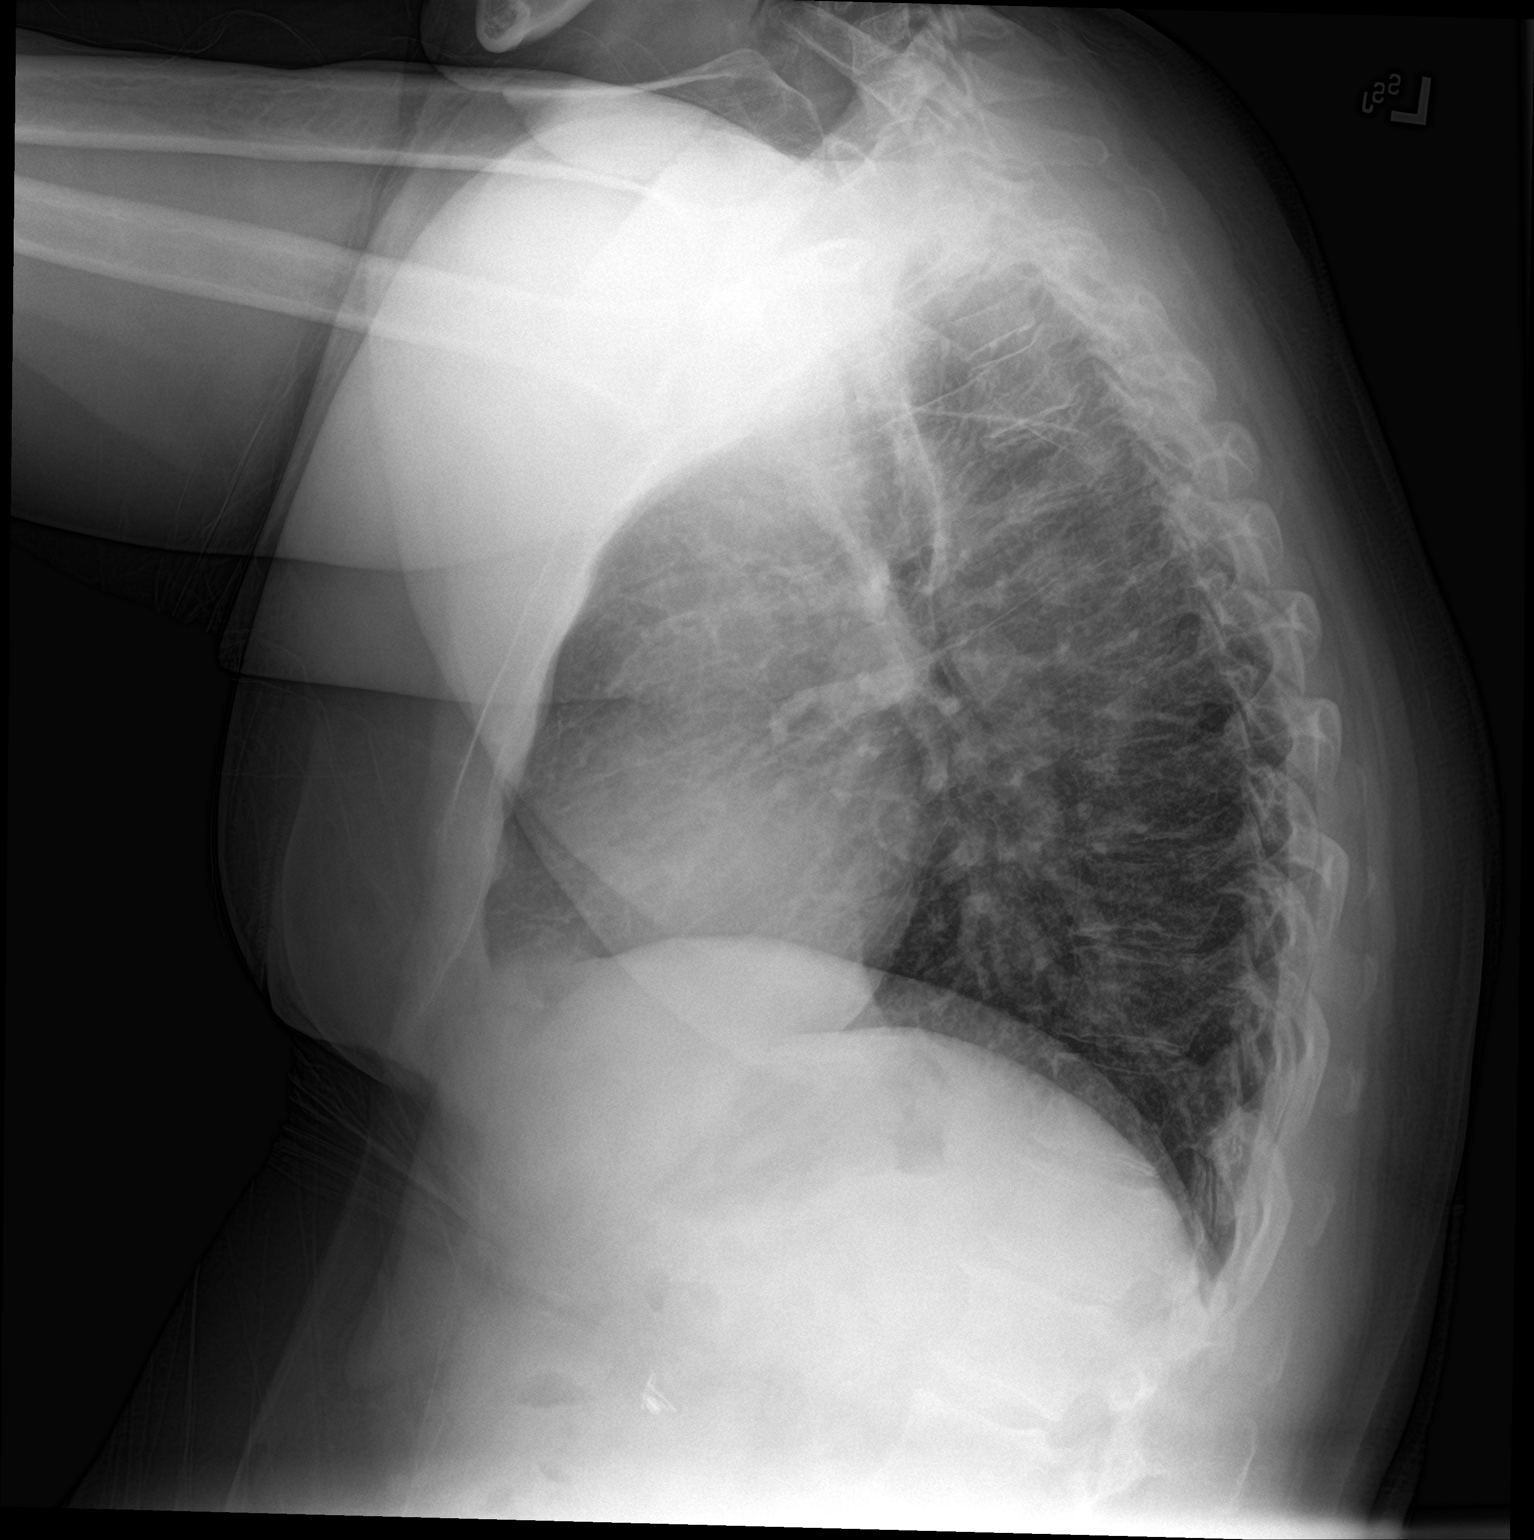

[2 of 2 positions shown; findings below may reference images not displayed]

FINDINGS: Normal heart size and mediastinal contours. No acute infiltrate or
edema. No effusion or pneumothorax. No acute osseous findings.
Thoracic scoliosis. Cholecystectomy clips.
IMPRESSION: No evidence of active disease.

## 2021-04-21 DIAGNOSIS — E119 Type 2 diabetes mellitus without complications: Secondary | ICD-10-CM | POA: Diagnosis not present

## 2021-05-05 ENCOUNTER — Encounter: Payer: Self-pay | Admitting: Internal Medicine

## 2021-05-06 NOTE — Telephone Encounter (Signed)
I would prefer to talk to her about symptoms, etc and then can determine what tests are needed.  ?

## 2021-05-06 NOTE — Telephone Encounter (Signed)
Is this something you would prefer to discuss with her prior to ordering? ? ?

## 2021-05-07 ENCOUNTER — Other Ambulatory Visit (INDEPENDENT_AMBULATORY_CARE_PROVIDER_SITE_OTHER): Payer: BC Managed Care – PPO

## 2021-05-07 ENCOUNTER — Other Ambulatory Visit: Payer: Self-pay

## 2021-05-07 DIAGNOSIS — Z114 Encounter for screening for human immunodeficiency virus [HIV]: Secondary | ICD-10-CM

## 2021-05-07 DIAGNOSIS — I10 Essential (primary) hypertension: Secondary | ICD-10-CM | POA: Diagnosis not present

## 2021-05-07 DIAGNOSIS — R319 Hematuria, unspecified: Secondary | ICD-10-CM | POA: Diagnosis not present

## 2021-05-07 DIAGNOSIS — Z1159 Encounter for screening for other viral diseases: Secondary | ICD-10-CM

## 2021-05-07 DIAGNOSIS — E1165 Type 2 diabetes mellitus with hyperglycemia: Secondary | ICD-10-CM

## 2021-05-07 DIAGNOSIS — E782 Mixed hyperlipidemia: Secondary | ICD-10-CM | POA: Diagnosis not present

## 2021-05-07 LAB — LIPID PANEL
Cholesterol: 159 mg/dL (ref 0–200)
HDL: 60.6 mg/dL
LDL Cholesterol: 80 mg/dL (ref 0–99)
NonHDL: 98.61
Total CHOL/HDL Ratio: 3
Triglycerides: 92 mg/dL (ref 0.0–149.0)
VLDL: 18.4 mg/dL (ref 0.0–40.0)

## 2021-05-07 LAB — URINALYSIS, ROUTINE W REFLEX MICROSCOPIC
Bilirubin Urine: NEGATIVE
Ketones, ur: NEGATIVE
Leukocytes,Ua: NEGATIVE
Nitrite: NEGATIVE
Specific Gravity, Urine: >=1.03 — AB (ref 1.000–1.030)
Total Protein, Urine: NEGATIVE
Urine Glucose: NEGATIVE
Urobilinogen, UA: 0.2 (ref 0.0–1.0)
pH: 5.5 (ref 5.0–8.0)

## 2021-05-07 LAB — HEPATIC FUNCTION PANEL
ALT: 36 U/L — ABNORMAL HIGH (ref 0–35)
AST: 23 U/L (ref 0–37)
Albumin: 4.2 g/dL (ref 3.5–5.2)
Alkaline Phosphatase: 71 U/L (ref 39–117)
Bilirubin, Direct: 0.1 mg/dL (ref 0.0–0.3)
Total Bilirubin: 0.6 mg/dL (ref 0.2–1.2)
Total Protein: 7.1 g/dL (ref 6.0–8.3)

## 2021-05-07 LAB — BASIC METABOLIC PANEL
BUN: 10 mg/dL (ref 6–23)
CO2: 25 mEq/L (ref 19–32)
Calcium: 9.4 mg/dL (ref 8.4–10.5)
Chloride: 105 mEq/L (ref 96–112)
Creatinine, Ser: 0.72 mg/dL (ref 0.40–1.20)
GFR: 95.24 mL/min (ref >60.00)
Glucose, Bld: 104 mg/dL — ABNORMAL HIGH (ref 70–99)
Potassium: 3.9 mEq/L (ref 3.5–5.1)
Sodium: 137 mEq/L (ref 135–145)

## 2021-05-07 LAB — HEMOGLOBIN A1C: Hgb A1c MFr Bld: 5.6 % (ref 4.6–6.5)

## 2021-05-10 ENCOUNTER — Ambulatory Visit (INDEPENDENT_AMBULATORY_CARE_PROVIDER_SITE_OTHER): Payer: BC Managed Care – PPO | Admitting: Internal Medicine

## 2021-05-10 ENCOUNTER — Other Ambulatory Visit: Payer: Self-pay

## 2021-05-10 ENCOUNTER — Other Ambulatory Visit (HOSPITAL_COMMUNITY)
Admission: RE | Admit: 2021-05-10 | Discharge: 2021-05-10 | Disposition: A | Discharge status: Home / Self Care | Payer: BC Managed Care – PPO | Source: Ambulatory Visit | Attending: Internal Medicine | Admitting: Internal Medicine

## 2021-05-10 VITALS — BP 130/72 | HR 84 | Temp 97.9°F | Resp 16 | Ht 65.5 in | Wt 244.0 lb

## 2021-05-10 DIAGNOSIS — I480 Paroxysmal atrial fibrillation: Secondary | ICD-10-CM

## 2021-05-10 DIAGNOSIS — G4733 Obstructive sleep apnea (adult) (pediatric): Secondary | ICD-10-CM | POA: Diagnosis not present

## 2021-05-10 DIAGNOSIS — I10 Essential (primary) hypertension: Secondary | ICD-10-CM | POA: Diagnosis not present

## 2021-05-10 DIAGNOSIS — R197 Diarrhea, unspecified: Secondary | ICD-10-CM | POA: Diagnosis not present

## 2021-05-10 DIAGNOSIS — E1165 Type 2 diabetes mellitus with hyperglycemia: Secondary | ICD-10-CM

## 2021-05-10 DIAGNOSIS — H539 Unspecified visual disturbance: Secondary | ICD-10-CM

## 2021-05-10 DIAGNOSIS — K760 Fatty (change of) liver, not elsewhere classified: Secondary | ICD-10-CM

## 2021-05-10 DIAGNOSIS — Z124 Encounter for screening for malignant neoplasm of cervix: Secondary | ICD-10-CM

## 2021-05-10 DIAGNOSIS — E782 Mixed hyperlipidemia: Secondary | ICD-10-CM

## 2021-05-10 DIAGNOSIS — Z0000 Encounter for general adult medical examination without abnormal findings: Secondary | ICD-10-CM | POA: Diagnosis not present

## 2021-05-10 DIAGNOSIS — R319 Hematuria, unspecified: Secondary | ICD-10-CM

## 2021-05-10 LAB — HIV ANTIBODY (ROUTINE TESTING W REFLEX): HIV 1&2 Ab, 4th Generation: NONREACTIVE

## 2021-05-10 LAB — HEPATITIS C ANTIBODY
Hepatitis C Ab: NONREACTIVE
SIGNAL TO CUT-OFF: <0.02 (ref <1.00)

## 2021-05-10 NOTE — Progress Notes (Signed)
Patient ID: Joy Houston, female   DOB: August 28, 1965, 56 y.o.   MRN: [id number] ? ? ?Subjective:  ? ? Patient ID: Joy Houston, female    DOB: May 14, 1965, 56 y.o.   MRN: [id number] ? ?This visit occurred during the SARS-CoV-2 public health emergency.  Safety protocols were in place, including screening questions prior to the visit, additional usage of staff PPE, and extensive cleaning of exam room while observing appropriate contact time as indicated for disinfecting solutions.  ? ?Patient here for her physical exam.  ? ?Chief Complaint  ?Patient presents with  ? Annual Exam  ? .  ? ?HPI ?Here for her physical exam.  She has had persistent problems with diarrhea. (Ongoing for at least 3 years). Symptoms seem to have been more noticeable after cholecystectomy 08/2018.  Work up to date has included - TTgIGA negative.  Lipase wnl.  CT abd/pelvis - diverticulosis, otherwise negative. Colonoscopy negative for microscopic colitis.  TSH/T4 negative.  No weight loss.  Eating.  No nausea or vomiting.  Feels diarrhea has worsened recently.  Desires a f/u stool test for O&P.  Discussed that post-cholecystectomy is likely cause of her diarrhea.  She has been hesitant to take colestid.  Discussed again with her today.  No fever.  Heart - overall improved.  Still may occasionally feel what she described as "possible PVCs", but overall improved.  Breathing improved.  Did describe episode of vision change.  No actual loss of vision - just more film/change.  States has occurred intermittently throughout the years.  Had seen ophthalmology previously and was diagnosed with occular migraines.  States recent episode felt similar.  No headache.   ? ? ?Past Medical History:  ?Diagnosis Date  ? Anemia   ? Angio-edema   ? Atrial fibrillation ([place])   ? Complication of anesthesia   ? Diabetes mellitus without complication ([place])   ? diet controlled  ? Dysplastic nevus 06/27/2018  ? Left upper back. Moderate atypia, limited margins free.   ?  Eczema   ? [place] Patient infection   ? Family history of anesthesia complication   ? Mother - confusion  ? Fatty liver   ? Heart murmur   ? Slight - "nothing to worry about"  ? Hemorrhoid   ? Hypertension   ? Hypothyroidism   ? PONV (postoperative nausea and vomiting)   ? "only with c-sections"  ? Sleep apnea   ? mild  ? ?Past Surgical History:  ?Procedure Laterality Date  ? CESAREAN SECTION    ? CHOLECYSTECTOMY N/A 09/07/2018  ? Procedure: LAPAROSCOPIC CHOLECYSTECTOMY;  Surgeon: Herbert Pun, MD;  Location: [facility] ORS;  Service: General;  Laterality: N/A;  ? FINGER SURGERY    ? pin to 3rd finger Right hand  ? LUMBAR LAMINECTOMY/DECOMPRESSION MICRODISCECTOMY  12/15/2011  ? Procedure: LUMBAR LAMINECTOMY/DECOMPRESSION MICRODISCECTOMY 1 LEVEL;  Surgeon: Ophelia Charter, MD;  Location: [facility];  Service: Neurosurgery;  Laterality: Right;  RIGHT Lumbar four-Five diskectomy  ? LUMBAR LAMINECTOMY/DECOMPRESSION MICRODISCECTOMY N/A 11/04/2019  ? Procedure: MICRODISCECTOMY LEFT LUMBAR FIVE SACRAL ONE;  Surgeon: Newman Pies, MD;  Location: [facility];  Service: Neurosurgery;  Laterality: N/A;  3C  ? MICRODISCECTOMY LUMBAR    ? L4-5  ? WISDOM TOOTH EXTRACTION    ? ?Family History  ?Problem Relation Age of Onset  ? GER disease Mother   ? Diabetes Father   ? Cancer Father   ?     Melanoma, Prostate  ? Heart disease Father   ?  Heart disease Maternal Grandfather   ? Diabetes Paternal Grandfather   ? Breast cancer Sister 85  ? ?Social History  ? ?Socioeconomic History  ? Marital status: Married  ?  Spouse name: Not on file  ? Number of children: 2  ? Years of education: Not on file  ? Highest education level: Not on file  ?Occupational History  ?  Employer: [employer]  ?Tobacco Use  ? Smoking status: Never  ? Smokeless tobacco: Never  ?Vaping Use  ? Vaping Use: Never used  ?Substance and Sexual Activity  ? Alcohol use: Not Currently  ?  Alcohol/week: 0.0 standard drinks  ? Drug use: No  ? Sexual activity: Not on  file  ?Other Topics Concern  ? 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 ?Social Determinants of Health  ? ?Financial Resource Strain: Not on file  ?Food Insecurity: Not on file  ?Transportation Needs: Not on file  ?Physical Activity: Not on file  ?Stress: Not on file  ?Social Connections: Not on file  ? ? ? ?Review of Systems  ?Constitutional:  Negative for appetite change and unexpected weight change.  ?HENT:  Negative for congestion, sinus pressure and sore throat.   ?Eyes:  Negative for pain and visual disturbance.  ?Respiratory:  Negative for cough and chest tightness.   ?     Breathing overall improved.   ?Cardiovascular:  Negative for chest pain and leg swelling.  ?Gastrointestinal:  Positive for diarrhea. Negative for abdominal pain, nausea and vomiting.  ?Genitourinary:  Negative for difficulty urinating and dysuria.  ?Musculoskeletal:  Negative for joint swelling and myalgias.  ?Skin:  Negative for color change and rash.  ?Neurological:  Negative for dizziness, light-headedness and headaches.  ?Hematological:  Negative for adenopathy. Does not bruise/bleed easily.  ?Psychiatric/Behavioral:  Negative for agitation and dysphoric mood.   ? ?   ?Objective:  ?  ? ?BP 130/72   Pulse 84   Temp 97.9 ?F (36.6 ?C)   Resp 16   Ht 5' 5.5" (1.664 m)   Wt 244 lb (110.7 kg)   SpO2 98%   BMI 39.99 kg/m?  ?Wt Readings from Last 3 Encounters:  ?05/10/21 244 lb (110.7 kg)  ?03/18/21 246 lb (111.6 kg)  ?01/13/21 238 lb (108 kg)  ? ? ?Physical Exam ?Vitals reviewed.  ?Constitutional:   ?   General: She is not in acute distress. ?   Appearance: Normal appearance. She is well-developed.  ?HENT:  ?   Head: Normocephalic and atraumatic.  ?   Right Ear: External ear normal.  ?   Left Ear: External ear normal.  ?Eyes:  ?   General: No scleral icterus.    ?   Right eye: No discharge.     ?   Left eye: No discharge.  ?   Conjunctiva/sclera: Conjunctivae normal.  ?Neck:  ?   Thyroid: No thyromegaly.   ?Cardiovascular:  ?   Rate and Rhythm: Normal rate and regular rhythm.  ?Pulmonary:  ?   Effort: No tachypnea, accessory muscle usage or respiratory distress.  ?   Breath sounds: Normal breath sounds. No decreased breath sounds or wheezing.  ?Chest:  ?Breasts: ?   Right: No inverted nipple, mass, nipple discharge or tenderness (no axillary adenopathy).  ?   Left: No inverted nipple, mass, nipple discharge or tenderness (no axilarry adenopathy).  ?Abdominal:  ?   General: Bowel sounds are normal.  ?   Palpations: Abdomen  is soft.  ?   Tenderness: There is no abdominal tenderness.  ?Genitourinary: ?   Comments: Normal external genitalia.  Vaginal vault without lesions.  Cervix identified.  Pap smear performed.  Could not appreciate any adnexal masses or tenderness.   ?Musculoskeletal:     ?   General: No swelling or tenderness.  ?   Cervical back: Neck supple.  ?Lymphadenopathy:  ?   Cervical: No cervical adenopathy.  ?Skin: ?   Findings: No erythema or rash.  ?Neurological:  ?   Mental Status: She is alert and oriented to person, place, and time.  ?Psychiatric:     ?   Mood and Affect: Mood normal.     ?   Behavior: Behavior normal.  ? ? ? ?Outpatient Encounter Medications as of 05/10/2021  ?Medication Sig  ? Ascorbic Acid (VITAMIN C PO) Take by mouth.  ? EPINEPHrine (EPIPEN 2-PAK) 0.3 mg/0.3 mL IJ SOAJ injection Inject 0.3 mg into the muscle as needed for anaphylaxis.  ? hydrocortisone 2.5 % ointment For up to 1-2 weeks as needed.  ? ketoconazole (NIZORAL) 2 % cream Apply to areas of rash twice daily as needed.  ? metoprolol succinate (TOPROL-XL) 25 MG 24 hr tablet Take 12.5 mg by mouth daily.  ? Syringe/Needle, Disp, (SYRINGE 3CC/25GX1") 25G X 1" 3 ML MISC Use as directed with b12 injections.  ? VITAMIN D PO Take by mouth.  ? ?No facility-administered encounter medications on file as of 05/10/2021.  ?  ? ?Lab Results  ?Component Value Date  ? WBC 7.9 01/13/2021  ? HGB 14.8 01/13/2021  ? HCT 43.4 01/13/2021  ? PLT  214.0 01/13/2021  ? GLUCOSE 104 (H) 05/07/2021  ? CHOL 159 05/07/2021  ? TRIG 92.0 05/07/2021  ? HDL 60.60 05/07/2021  ? LDLDIRECT 145.4 03/01/2013  ? Needles 80 05/07/2021  ? ALT 36 (H) 05/07/2021  ? AST 23 05/07/2021

## 2021-05-11 ENCOUNTER — Encounter: Payer: Self-pay | Admitting: Internal Medicine

## 2021-05-11 ENCOUNTER — Other Ambulatory Visit
Admission: RE | Admit: 2021-05-11 | Discharge: 2021-05-11 | Disposition: A | Discharge status: Home / Self Care | Payer: BC Managed Care – PPO | Source: Ambulatory Visit | Attending: Internal Medicine | Admitting: Internal Medicine

## 2021-05-11 DIAGNOSIS — R197 Diarrhea, unspecified: Secondary | ICD-10-CM | POA: Insufficient documentation

## 2021-05-11 LAB — CYTOLOGY - PAP
Comment: NEGATIVE
Diagnosis: NEGATIVE
High risk HPV: NEGATIVE

## 2021-05-11 NOTE — Assessment & Plan Note (Signed)
Previously reported on ultrasound 2020.  Discussed liver function today.  Discussed diet, exercise and weight loss.  Follow liver function tests.  ?

## 2021-05-11 NOTE — Assessment & Plan Note (Signed)
Low cholesterol diet and exercise.  Follow lipid panel.   

## 2021-05-11 NOTE — Assessment & Plan Note (Signed)
She has had persistent problems with diarrhea. (Ongoing for at least 3 years). Symptoms seem to have been more noticeable after cholecystectomy 08/2018.  Work up to date has included - TTgIGA negative.  Lipase wnl.  CT abd/pelvis - diverticulosis, otherwise negative. Colonoscopy negative for microscopic colitis.  TSH/T4 negative.  No weight loss.  Eating.  No nausea or vomiting.  Feels diarrhea has worsened recently.  Desires a f/u stool test for O&P.  Discussed that post-cholecystectomy is likely cause of her diarrhea.  She has been hesitant to take colestid.  Discussed again with her today.  ?

## 2021-05-11 NOTE — Assessment & Plan Note (Addendum)
S/p ablation.  Off xarelto.  Afib/symptoms improved.  On 1/2 metoprolol. Follow pressures.  Breathing better.  Discussed anticoagulation, especially given vision issues, etc.  She declines.   ?

## 2021-05-11 NOTE — Assessment & Plan Note (Signed)
Blood pressure elevated above goal.  Discussed.  Have her spot check her pressure. Continue metoprolol.  If persistent elevation, will require further medication.  She wants to work on diet and exercise.   ?

## 2021-05-11 NOTE — Assessment & Plan Note (Signed)
Has an appt 05/20/21 - to have a crown fixed.  Affecting her use of her dental device.   ?

## 2021-05-11 NOTE — Assessment & Plan Note (Signed)
Physical today 05/10/21.  PAP 05/10/21.  Mammogram 04/12/21 - Birads I.  Colonoscopy 12/31/20.  ?

## 2021-05-11 NOTE — Assessment & Plan Note (Signed)
Did describe episode of vision change as outlined.  No actual loss of vision - just more film/change.  States has occurred intermittently throughout the years.  Had seen ophthalmology previously and was diagnosed with occular migraines.  States recent episode felt similar.  No headache.  Discussed further w/up including MRI, carotid ultrasound, neurology evaluation and f/u eye exam.  Agreed to eye exam. Declines further w/up at this time.  Will start ecasa '81mg'$  q day.  ?

## 2021-05-11 NOTE — Assessment & Plan Note (Signed)
Continue low carb diet and exercise.  Follow met b and a1c.   Lab Results  Component Value Date   HGBA1C 5.6 05/07/2021   

## 2021-05-11 NOTE — Assessment & Plan Note (Signed)
Has seen Dr Bernardo Heater previously.  Recent urinalysis - no red blood cells.  Trace hemoglobin.  Follow.  ?

## 2021-05-13 LAB — O&P RESULT

## 2021-05-13 LAB — OVA + PARASITE EXAM

## 2021-05-22 DIAGNOSIS — E119 Type 2 diabetes mellitus without complications: Secondary | ICD-10-CM | POA: Diagnosis not present

## 2021-05-26 LAB — HM DIABETES EYE EXAM

## 2021-06-07 DIAGNOSIS — I10 Essential (primary) hypertension: Secondary | ICD-10-CM | POA: Diagnosis not present

## 2021-06-07 DIAGNOSIS — R002 Palpitations: Secondary | ICD-10-CM | POA: Diagnosis not present

## 2021-06-07 DIAGNOSIS — R197 Diarrhea, unspecified: Secondary | ICD-10-CM | POA: Diagnosis not present

## 2021-06-16 DIAGNOSIS — I10 Essential (primary) hypertension: Secondary | ICD-10-CM | POA: Diagnosis not present

## 2021-06-21 DIAGNOSIS — E119 Type 2 diabetes mellitus without complications: Secondary | ICD-10-CM | POA: Diagnosis not present

## 2021-06-30 ENCOUNTER — Encounter: Payer: Self-pay | Admitting: Internal Medicine

## 2021-06-30 ENCOUNTER — Ambulatory Visit: Payer: BC Managed Care – PPO | Admitting: Internal Medicine

## 2021-06-30 DIAGNOSIS — I10 Essential (primary) hypertension: Secondary | ICD-10-CM | POA: Diagnosis not present

## 2021-06-30 DIAGNOSIS — G4733 Obstructive sleep apnea (adult) (pediatric): Secondary | ICD-10-CM | POA: Diagnosis not present

## 2021-06-30 DIAGNOSIS — I480 Paroxysmal atrial fibrillation: Secondary | ICD-10-CM | POA: Diagnosis not present

## 2021-06-30 DIAGNOSIS — E782 Mixed hyperlipidemia: Secondary | ICD-10-CM

## 2021-06-30 DIAGNOSIS — R42 Dizziness and giddiness: Secondary | ICD-10-CM

## 2021-06-30 DIAGNOSIS — E1165 Type 2 diabetes mellitus with hyperglycemia: Secondary | ICD-10-CM

## 2021-06-30 DIAGNOSIS — K760 Fatty (change of) liver, not elsewhere classified: Secondary | ICD-10-CM | POA: Diagnosis not present

## 2021-06-30 DIAGNOSIS — H539 Unspecified visual disturbance: Secondary | ICD-10-CM

## 2021-06-30 NOTE — Progress Notes (Signed)
Patient ID: Joy Houston, female   DOB: 1966/01/26, 56 y.o.   MRN: [id number] ? ? ?Subjective:  ? ? Patient ID: Joy Houston, female    DOB: September 27, 1965, 56 y.o.   MRN: [id number] ? ?This visit occurred during the SARS-CoV-2 public health emergency.  Safety protocols were in place, including screening questions prior to the visit, additional usage of staff PPE, and extensive cleaning of exam room while observing appropriate contact time as indicated for disinfecting solutions.  ? ?Patient here for a scheduled follow up.  ? ?Chief Complaint  ?Patient presents with  ? Follow-up  ?  7 week follow up -HTN  ? .  ? ?HPI ?Saw Dr Nehemiah Massed recently - 06/16/21.  Stable from cardiac standpoint.  She is getting out walking more.  No chest pain reported.  Breathing overall stable.  She did describe dizziness.  Noticed - recently.  (Some headache and dizziness).  Cardiology - placed her on amlodipine.  States had itching - foot, nose.  No rash.  Stopped secondary to concerns regarding possibly allergic to the medication.  Eating.  No vomiting reported.  Bowels stable.  Blood pressure elevated.  Only taking metoprolol.  Had angioedema - ace inhibitor.  Discussed diet and exercise.   ? ? ?Past Medical History:  ?Diagnosis Date  ? Anemia   ? Angio-edema   ? Atrial fibrillation ([place])   ? Complication of anesthesia   ? Diabetes mellitus without complication ([place])   ? diet controlled  ? Dysplastic nevus 06/27/2018  ? Left upper back. Moderate atypia, limited margins free.   ? Eczema   ? [facility] Patient infection   ? Family history of anesthesia complication   ? Mother - confusion  ? Fatty liver   ? Heart murmur   ? Slight - "nothing to worry about"  ? Hemorrhoid   ? Hypertension   ? Hypothyroidism   ? PONV (postoperative nausea and vomiting)   ? "only with c-sections"  ? Sleep apnea   ? mild  ? ?Past Surgical History:  ?Procedure Laterality Date  ? CESAREAN SECTION    ? CHOLECYSTECTOMY N/A 09/07/2018  ? Procedure: LAPAROSCOPIC  CHOLECYSTECTOMY;  Surgeon: Herbert Pun, MD;  Location: [facility] ORS;  Service: General;  Laterality: N/A;  ? FINGER SURGERY    ? pin to 3rd finger Right hand  ? LUMBAR LAMINECTOMY/DECOMPRESSION MICRODISCECTOMY  12/15/2011  ? Procedure: LUMBAR LAMINECTOMY/DECOMPRESSION MICRODISCECTOMY 1 LEVEL;  Surgeon: Ophelia Charter, MD;  Location: [place] [facility];  Service: Neurosurgery;  Laterality: Right;  RIGHT Lumbar four-Five diskectomy  ? LUMBAR LAMINECTOMY/DECOMPRESSION MICRODISCECTOMY N/A 11/04/2019  ? Procedure: MICRODISCECTOMY LEFT LUMBAR FIVE SACRAL ONE;  Surgeon: Newman Pies, MD;  Location: [place];  Service: Neurosurgery;  Laterality: N/A;  3C  ? MICRODISCECTOMY LUMBAR    ? L4-5  ? WISDOM TOOTH EXTRACTION    ? ?Family History  ?Problem Relation Age of Onset  ? GER disease Mother   ? Diabetes Father   ? Cancer Father   ?     Melanoma, Prostate  ? Heart disease Father   ? Heart disease Maternal Grandfather   ? Diabetes Paternal Grandfather   ? Breast cancer Sister 70  ? ?Social History  ? ?Socioeconomic History  ? Marital status: Married  ?  Spouse name: Not on file  ? Number of children: 2  ? Years of education: Not on file  ? Highest education level: Not on file  ?Occupational History  ?  Employer: [employer]  ?Tobacco  Use  ? Smoking status: Never  ? Smokeless tobacco: Never  ?Vaping Use  ? Vaping Use: Never used  ?Substance and Sexual Activity  ? Alcohol use: Not Currently  ?  Alcohol/week: 0.0 standard drinks  ? Drug use: No  ? Sexual activity: Not on file  ?Other Topics Concern  ? 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 ?Social Determinants of Health  ? ?Financial Resource Strain: Not on file  ?Food Insecurity: Not on file  ?Transportation Needs: Not on file  ?Physical Activity: Not on file  ?Stress: Not on file  ?Social Connections: Not on file  ? ? ? ?Review of Systems  ?Constitutional:  Negative for appetite change and unexpected weight change.  ?HENT:  Negative for  congestion and sinus pressure.   ?Respiratory:  Negative for cough and chest tightness.   ?     Breathing stable.   ?Cardiovascular:  Negative for chest pain.  ?     No significant increased heart rate or palpitations.   ?Gastrointestinal:  Negative for abdominal pain, diarrhea, nausea and vomiting.  ?Genitourinary:  Negative for difficulty urinating and dysuria.  ?Musculoskeletal:  Negative for joint swelling and myalgias.  ?Skin:  Negative for color change and rash.  ?Neurological:  Positive for dizziness and headaches.  ?Psychiatric/Behavioral:  Negative for agitation and dysphoric mood.   ? ?   ?Objective:  ?  ? ?BP (!) 154/90 (BP Location: Left Arm, Patient Position: Sitting, Cuff Size: Large)   Pulse 72   Temp 97.8 ?F (36.6 ?C) (Temporal)   Resp 20   Ht '5\' 5"'$  (1.651 m)   Wt 248 lb (112.5 kg)   SpO2 98%   BMI 41.27 kg/m?  ?Wt Readings from Last 3 Encounters:  ?06/30/21 248 lb (112.5 kg)  ?05/10/21 244 lb (110.7 kg)  ?03/18/21 246 lb (111.6 kg)  ? ? ?Physical Exam ?Vitals reviewed.  ?Constitutional:   ?   General: She is not in acute distress. ?   Appearance: Normal appearance.  ?HENT:  ?   Head: Normocephalic and atraumatic.  ?   Right Ear: External ear normal.  ?   Left Ear: External ear normal.  ?Eyes:  ?   General: No scleral icterus.    ?   Right eye: No discharge.     ?   Left eye: No discharge.  ?   Conjunctiva/sclera: Conjunctivae normal.  ?Neck:  ?   Thyroid: No thyromegaly.  ?Cardiovascular:  ?   Rate and Rhythm: Normal rate and regular rhythm.  ?Pulmonary:  ?   Effort: No respiratory distress.  ?   Breath sounds: Normal breath sounds. No wheezing.  ?Abdominal:  ?   General: Bowel sounds are normal.  ?   Palpations: Abdomen is soft.  ?   Tenderness: There is no abdominal tenderness.  ?Musculoskeletal:     ?   General: No swelling or tenderness.  ?   Cervical back: Neck supple. No tenderness.  ?Lymphadenopathy:  ?   Cervical: No cervical adenopathy.  ?Skin: ?   Findings: No erythema or rash.   ?Neurological:  ?   Mental Status: She is alert.  ?Psychiatric:     ?   Mood and Affect: Mood normal.     ?   Behavior: Behavior normal.  ? ? ? ?Outpatient Encounter Medications as of 06/30/2021  ?Medication Sig  ? Ascorbic Acid (VITAMIN C PO) Take by mouth.  ? EPINEPHrine (EPIPEN 2-PAK) 0.3  mg/0.3 mL IJ SOAJ injection Inject 0.3 mg into the muscle as needed for anaphylaxis.  ? hydrocortisone 2.5 % ointment For up to 1-2 weeks as needed.  ? ketoconazole (NIZORAL) 2 % cream Apply to areas of rash twice daily as needed.  ? metoprolol succinate (TOPROL-XL) 25 MG 24 hr tablet Take 12.5 mg by mouth daily.  ? Syringe/Needle, Disp, (SYRINGE 3CC/25GX1") 25G X 1" 3 ML MISC Use as directed with b12 injections.  ? VITAMIN D PO Take by mouth.  ? ?No facility-administered encounter medications on file as of 06/30/2021.  ?  ? ?Lab Results  ?Component Value Date  ? WBC 7.9 01/13/2021  ? HGB 14.8 01/13/2021  ? HCT 43.4 01/13/2021  ? PLT 214.0 01/13/2021  ? GLUCOSE 104 (H) 05/07/2021  ? CHOL 159 05/07/2021  ? TRIG 92.0 05/07/2021  ? HDL 60.60 05/07/2021  ? LDLDIRECT 145.4 03/01/2013  ? [place] 80 05/07/2021  ? ALT 36 (H) 05/07/2021  ? AST 23 05/07/2021  ? NA 137 05/07/2021  ? K 3.9 05/07/2021  ? CL 105 05/07/2021  ? CREATININE 0.72 05/07/2021  ? BUN 10 05/07/2021  ? CO2 25 05/07/2021  ? TSH 4.41 04/17/2020  ? HGBA1C 5.6 05/07/2021  ? MICROALBUR 1.6 01/13/2021  ? ? ?   ?Assessment & Plan:  ? ?Problem List Items Addressed This Visit   ? ? Change in vision  ?  Saw ophthalmology.  Felt to be occular migraines.  Follow.  ? ?  ?  ? Dizziness  ?  Better today.  Wants to monitor blood pressure and hold on medication.  Follow.  Discussed importance of treating blood pressure.   ? ?  ?  ? Fatty liver  ?  Previously reported on ultrasound 2020.  Discussed liver function today.  Discussed diet, exercise and weight loss.  Follow liver function tests.  ? ?  ?  ? Hyperlipidemia, mixed  ?  Low cholesterol diet and exercise.  Follow lipid panel.  ? ?   ?  ? Hypertension  ?  Blood pressure elevated.  On metoprolol.  Discussed treatment options.  Angioedema with ace inhibitor, so avoid ace inhibitor and ARB.  Had itching with amlodipine as outlined.  Discussed possibly increa

## 2021-07-01 ENCOUNTER — Other Ambulatory Visit: Payer: Self-pay

## 2021-07-01 ENCOUNTER — Encounter: Payer: Self-pay | Admitting: Internal Medicine

## 2021-07-01 MED ORDER — HYDRALAZINE HCL 10 MG PO TABS: 10.0000 mg | ORAL_TABLET | Freq: Three times a day (TID) | ORAL | 1 refills | Status: DC

## 2021-07-01 NOTE — Telephone Encounter (Signed)
Patient called back, headache is 0 to 1. BP is up, no other symptoms. Doesn't stop her day. Please call her.  ?

## 2021-07-01 NOTE — Telephone Encounter (Signed)
Please call and confirm doing ok. One of the blood pressure medications I discussed starting was hydralazine.  If she is agreeable, then ok to send in hydralazine '10mg'$  tid - #90 with 1 refill ?

## 2021-07-02 ENCOUNTER — Encounter: Payer: Self-pay | Admitting: Internal Medicine

## 2021-07-02 DIAGNOSIS — R42 Dizziness and giddiness: Secondary | ICD-10-CM | POA: Insufficient documentation

## 2021-07-02 NOTE — Assessment & Plan Note (Signed)
Blood pressure elevated.  On metoprolol.  Discussed treatment options.  Angioedema with ace inhibitor, so avoid ace inhibitor and ARB.  Had itching with amlodipine as outlined.  Discussed possibly increasing metoprolol.  Discussed addition of hydralazine.  She is hesitant to start any medication at this time.  Wants to work on diet and exercise.  Will monitor blood pressure.   ?

## 2021-07-02 NOTE — Assessment & Plan Note (Signed)
Saw ophthalmology.  Felt to be occular migraines.  Follow.  ?

## 2021-07-02 NOTE — Assessment & Plan Note (Signed)
Continue low carb diet and exercise.  Follow met b and a1c.   ?Lab Results  ?Component Value Date  ? HGBA1C 5.6 05/07/2021  ? ?

## 2021-07-02 NOTE — Assessment & Plan Note (Signed)
Has oral device.  Has not been using recently due to pain.  Declines cpap.  Discussed need to treat sleep apnea.  Discussed f/u regarding oral device.  ?

## 2021-07-02 NOTE — Assessment & Plan Note (Signed)
Previously reported on ultrasound 2020.  Discussed liver function today.  Discussed diet, exercise and weight loss.  Follow liver function tests.  ?

## 2021-07-02 NOTE — Assessment & Plan Note (Addendum)
Better today.  Wants to monitor blood pressure and hold on medication.  Follow.  Discussed importance of treating blood pressure.   ?

## 2021-07-02 NOTE — Assessment & Plan Note (Signed)
Low cholesterol diet and exercise.  Follow lipid panel.   

## 2021-07-02 NOTE — Assessment & Plan Note (Signed)
S/p ablation.  Off xarelto.  Afib/symptoms improved.  On 1/2 metoprolol. Follow pressures.  Breathing better.  Have discussed anticoagulation. She has declined.   

## 2021-07-16 DIAGNOSIS — E039 Hypothyroidism, unspecified: Secondary | ICD-10-CM | POA: Diagnosis not present

## 2021-07-16 DIAGNOSIS — E538 Deficiency of other specified B group vitamins: Secondary | ICD-10-CM | POA: Diagnosis not present

## 2021-07-16 DIAGNOSIS — R5383 Other fatigue: Secondary | ICD-10-CM | POA: Diagnosis not present

## 2021-07-16 DIAGNOSIS — N951 Menopausal and female climacteric states: Secondary | ICD-10-CM | POA: Diagnosis not present

## 2021-07-16 DIAGNOSIS — I10 Essential (primary) hypertension: Secondary | ICD-10-CM | POA: Diagnosis not present

## 2021-07-16 DIAGNOSIS — Z1329 Encounter for screening for other suspected endocrine disorder: Secondary | ICD-10-CM | POA: Diagnosis not present

## 2021-07-16 DIAGNOSIS — R7982 Elevated C-reactive protein (CRP): Secondary | ICD-10-CM | POA: Diagnosis not present

## 2021-07-16 DIAGNOSIS — E559 Vitamin D deficiency, unspecified: Secondary | ICD-10-CM | POA: Diagnosis not present

## 2021-07-16 LAB — VITAMIN D 25 HYDROXY (VIT D DEFICIENCY, FRACTURES): Vit D, 25-Hydroxy: 34.4

## 2021-07-16 LAB — CBC AND DIFFERENTIAL
HCT: 46 (ref 36–46)
Hemoglobin: 15.2 (ref 12.0–16.0)
Platelets: 238 10*3/uL (ref 150–400)
WBC: 9.1

## 2021-07-16 LAB — HEPATIC FUNCTION PANEL
ALT: 30 U/L (ref 7–35)
AST: 30 (ref 13–35)
Alkaline Phosphatase: 78 (ref 25–125)
Bilirubin, Total: 0.6

## 2021-07-16 LAB — IRON,TIBC AND FERRITIN PANEL
%SAT: 19.91
Ferritin: 79
Iron: 90
TIBC: 452

## 2021-07-16 LAB — VITAMIN B12: Vitamin B-12: >2000

## 2021-07-16 LAB — COMPREHENSIVE METABOLIC PANEL
Albumin: 4.4 (ref 3.5–5.0)
Calcium: 9.6 (ref 8.7–10.7)
eGFR: 134

## 2021-07-16 LAB — BASIC METABOLIC PANEL
BUN: 11 (ref 4–21)
CO2: 30 — AB (ref 13–22)
Chloride: 104 (ref 99–108)
Creatinine: 0.6 (ref 0.5–1.1)
Glucose: 106
Potassium: 4.3 mEq/L (ref 3.5–5.1)
Sodium: 138 (ref 137–147)

## 2021-07-16 LAB — TSH: TSH: 5 (ref 0.41–5.90)

## 2021-07-16 LAB — CBC: RBC: 4.83 (ref 3.87–5.11)

## 2021-07-22 ENCOUNTER — Encounter: Payer: Self-pay | Admitting: Internal Medicine

## 2021-07-22 ENCOUNTER — Ambulatory Visit: Payer: BC Managed Care – PPO | Admitting: Internal Medicine

## 2021-07-22 DIAGNOSIS — E1165 Type 2 diabetes mellitus with hyperglycemia: Secondary | ICD-10-CM | POA: Diagnosis not present

## 2021-07-22 DIAGNOSIS — I10 Essential (primary) hypertension: Secondary | ICD-10-CM

## 2021-07-22 DIAGNOSIS — G4733 Obstructive sleep apnea (adult) (pediatric): Secondary | ICD-10-CM

## 2021-07-22 DIAGNOSIS — I480 Paroxysmal atrial fibrillation: Secondary | ICD-10-CM | POA: Diagnosis not present

## 2021-07-22 DIAGNOSIS — E119 Type 2 diabetes mellitus without complications: Secondary | ICD-10-CM | POA: Diagnosis not present

## 2021-07-22 NOTE — Progress Notes (Signed)
Patient ID: Joy Houston, female   DOB: 1966-01-16, 56 y.o.   MRN: [id number]   Subjective:    Patient ID: Joy Houston, female    DOB: Sep 29, 1965, 56 y.o.   MRN: [id number]   Patient here for a scheduled follow up.   Chief Complaint  Patient presents with   Follow-up   Hypertension   .   HPI Blood pressure has been elevated.  She was started on hydralazine last visit.  Is taking 48m bid.  Raises concerns about taking new medication due to her "intolerance of various medications".  She is tolerating hydralazine well.  Blood pressures are remaining elevated = 130-150/90s.  Discussed the need to get her blood pressures down.  She is on metoprolol.  Appears to be doing better regarding afib.  Breathing overall better.  No chest pain.  No increased cough or congestion.     Past Medical History:  Diagnosis Date   Anemia    Angio-edema    [place]    Complication of anesthesia    Diabetes mellitus without complication ([place]    diet controlled   Dysplastic nevus 06/27/2018   Left upper back. Moderate atypia, limited margins free.    Eczema    Epstein Barr infection    Family history of anesthesia complication    Mother - confusion   Fatty liver    Heart murmur    Slight - "nothing to worry about"   Hemorrhoid    Hypertension    Hypothyroidism    PONV (postoperative nausea and vomiting)    "only with c-sections"   Sleep apnea    mild   Past Surgical History:  Procedure Laterality Date   CESAREAN SECTION     CHOLECYSTECTOMY N/A 09/07/2018   Procedure: LAPAROSCOPIC CHOLECYSTECTOMY;  Surgeon: CHerbert Pun MD;  Location: [facility];  Service: General;  Laterality: N/A;   FINGER SURGERY     pin to 3rd finger Right hand   LUMBAR LAMINECTOMY/DECOMPRESSION MICRODISCECTOMY  12/15/2011   Procedure: LUMBAR LAMINECTOMY/DECOMPRESSION MICRODISCECTOMY 1 LEVEL;  Surgeon: JOphelia Charter MD;  [facility];  Service: Neurosurgery;  Laterality:  Right;  RIGHT Lumbar four-Five diskectomy   LUMBAR LAMINECTOMY/DECOMPRESSION MICRODISCECTOMY N/A 11/04/2019   Procedure: MICRODISCECTOMY LEFT LUMBAR FIVE SACRAL ONE;  Surgeon: JNewman Pies MD;  [place]  Service: Neurosurgery;  Laterality: N/A;  3C   MICRODISCECTOMY LUMBAR     L4-5   WISDOM TOOTH EXTRACTION     Family History  Problem Relation Age of Onset   GER disease Mother    Diabetes Father    Cancer Father        Melanoma, Prostate   Heart disease Father    Heart disease Maternal Grandfather    Diabetes Paternal Grandfather    Breast cancer Sister 552  Social History   Socioeconomic History   Marital status: Married    Spouse name: Not on file   Number of children: 2   Years of education: Not on file   Highest education level: Not on file  Occupational History    Employer: [employer]  Tobacco Use   Smoking status: Never   Smokeless tobacco: Never  Vaping Use   Vaping Use: Never used  Substance and Sexual Activity   Alcohol use: Not Currently    Alcohol/week: 0.0 standard drinks   Drug use: No   Sexual activity: Not on file  Other Topics Concern   Not on file  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  HENT:  Negative for congestion and sinus pressure.   Respiratory:  Negative for cough and chest tightness.        Breathing stable.   Cardiovascular:  Negative for chest pain and palpitations.       No increased swelling.   Gastrointestinal:  Negative for abdominal pain, nausea and vomiting.  Genitourinary:  Negative for difficulty urinating and dysuria.  Musculoskeletal:  Negative for joint swelling and  myalgias.  Skin:  Negative for color change and rash.  Neurological:  Negative for dizziness, light-headedness and headaches.  Psychiatric/Behavioral:  Negative for agitation and dysphoric mood.       Objective:     BP (!) 138/98 (BP Location: Left Arm, Patient Position: Sitting, Cuff Size: Large)   Pulse 82   Temp 97.6 F (36.4 C) (Temporal)   Resp 17   Ht 5' 5"  (1.651 m)   Wt 244 lb 12.8 oz (111 kg)   SpO2 98%   BMI 40.74 kg/m  Wt Readings from Last 3 Encounters:  07/22/21 244 lb 12.8 oz (111 kg)  06/30/21 248 lb (112.5 kg)  05/10/21 244 lb (110.7 kg)    Physical Exam Vitals reviewed.  Constitutional:      General: She is not in acute distress.    Appearance: Normal appearance.  HENT:     Head: Normocephalic and atraumatic.     Right Ear: External ear normal.     Left Ear: External ear normal.  Eyes:     General: No scleral icterus.       Right eye: No discharge.        Left eye: No discharge.     Conjunctiva/sclera: Conjunctivae normal.  Neck:     Thyroid: No thyromegaly.  Cardiovascular:     Rate and Rhythm: Normal rate and regular rhythm.  Pulmonary:     Effort: No respiratory distress.     Breath sounds: Normal breath sounds. No wheezing.  Abdominal:     General: Bowel sounds are normal.     Palpations: Abdomen is soft.     Tenderness: There is no abdominal tenderness.  Musculoskeletal:        General: No swelling or tenderness.     Cervical back: Neck supple. No tenderness.  Lymphadenopathy:     Cervical: No cervical adenopathy.  Skin:    Findings: No erythema or rash.  Neurological:     Mental Status: She is alert.  Psychiatric:        Mood and Affect: Mood normal.        Behavior: Behavior normal.     Outpatient Encounter Medications as of 07/22/2021  Medication Sig   Ascorbic Acid (VITAMIN C PO) Take by mouth.   EPINEPHrine (EPIPEN 2-PAK) 0.3 mg/0.3 mL IJ SOAJ injection Inject 0.3 mg into the muscle as needed for anaphylaxis.    hydrocortisone 2.5 % ointment For up to 1-2 weeks as needed.   ketoconazole (NIZORAL) 2 % cream Apply to areas of rash twice daily as needed.   metoprolol succinate (TOPROL-XL) 25 MG 24 hr tablet Take 12.5 mg by mouth daily.   Syringe/Needle,  Disp, (SYRINGE 3CC/25GX1") 25G X 1" 3 ML MISC Use as directed with b12 injections.   VITAMIN D PO Take by mouth.   [DISCONTINUED] hydrALAZINE (APRESOLINE) 10 MG tablet Take 1 tablet (10 mg total) by mouth 3 (three) times daily.   No facility-administered encounter medications on file as of 07/22/2021.     Lab Results  Component Value Date   WBC 7.9 01/13/2021   HGB 14.8 01/13/2021   HCT 43.4 01/13/2021   PLT 214.0 01/13/2021   GLUCOSE 104 (H) 05/07/2021   CHOL 159 05/07/2021   TRIG 92.0 05/07/2021   HDL 60.60 05/07/2021   LDLDIRECT 145.4 03/01/2013   LDLCALC 80 05/07/2021   ALT 36 (H) 05/07/2021   AST 23 05/07/2021   NA 137 05/07/2021   K 3.9 05/07/2021   CL 105 05/07/2021   CREATININE 0.72 05/07/2021   BUN 10 05/07/2021   CO2 25 05/07/2021   TSH 4.41 04/17/2020   HGBA1C 5.6 05/07/2021   MICROALBUR 1.6 01/13/2021       Assessment & Plan:   Problem List Items Addressed This Visit     Hypertension    Blood pressure remains elevated.  Current ly metoprolol and hydralazine 12m bid.  Will increase to tid.  Follow pressures.  Get her back in soon to reassess.  May need to increase dose.  Follow pressures.  Follow metabolic panel.        OSA (obstructive sleep apnea)    Has oral device.  Has not been using recently due to pain.  Declines cpap.  Discussed need to treat sleep apnea.  Discussed f/u regarding oral device.        Paroxysmal A-fib ([facility]    S/p ablation.  Off xarelto.  Afib/symptoms improved.  On 1/2 metoprolol. Follow pressures.  Breathing better.  Have discussed anticoagulation. She has declined.         Type 2 diabetes mellitus with hyperglycemia ([facility])    Continue low carb diet and exercise.  Follow met b and a1c.    Lab Results  Component Value Date   HGBA1C 5.6 05/07/2021         CEinar Pheasant MD

## 2021-07-23 ENCOUNTER — Other Ambulatory Visit: Payer: Self-pay | Admitting: Internal Medicine

## 2021-07-25 NOTE — Assessment & Plan Note (Signed)
Blood pressure remains elevated.  Current ly metoprolol and hydralazine '10mg'$  bid.  Will increase to tid.  Follow pressures.  Get her back in soon to reassess.  May need to increase dose.  Follow pressures.  Follow metabolic panel.

## 2021-07-25 NOTE — Assessment & Plan Note (Signed)
S/p ablation.  Off xarelto.  Afib/symptoms improved.  On 1/2 metoprolol. Follow pressures.  Breathing better.  Have discussed anticoagulation. She has declined.   

## 2021-07-25 NOTE — Assessment & Plan Note (Signed)
Has oral device.  Has not been using recently due to pain.  Declines cpap.  Discussed need to treat sleep apnea.  Discussed f/u regarding oral device.

## 2021-07-25 NOTE — Assessment & Plan Note (Signed)
Continue low carb diet and exercise.  Follow met b and a1c.   Lab Results  Component Value Date   HGBA1C 5.6 05/07/2021   

## 2021-07-26 ENCOUNTER — Encounter: Payer: Self-pay | Admitting: Internal Medicine

## 2021-08-01 ENCOUNTER — Encounter: Payer: Self-pay | Admitting: Internal Medicine

## 2021-08-02 DIAGNOSIS — G43909 Migraine, unspecified, not intractable, without status migrainosus: Secondary | ICD-10-CM | POA: Diagnosis not present

## 2021-08-02 DIAGNOSIS — Z77120 Contact with and (suspected) exposure to mold (toxic): Secondary | ICD-10-CM | POA: Diagnosis not present

## 2021-08-02 DIAGNOSIS — I10 Essential (primary) hypertension: Secondary | ICD-10-CM | POA: Diagnosis not present

## 2021-08-02 DIAGNOSIS — E039 Hypothyroidism, unspecified: Secondary | ICD-10-CM | POA: Diagnosis not present

## 2021-08-04 MED ORDER — HYDRALAZINE HCL 10 MG PO TABS: ORAL_TABLET | ORAL | 2 refills | Status: DC

## 2021-08-04 NOTE — Telephone Encounter (Signed)
Rx sent in for hydralazine 10 mg - 2 tablets tid.

## 2021-08-19 ENCOUNTER — Encounter: Payer: Self-pay | Admitting: Internal Medicine

## 2021-08-19 ENCOUNTER — Ambulatory Visit: Payer: BC Managed Care – PPO | Admitting: Internal Medicine

## 2021-08-19 VITALS — BP 140/84 | HR 87 | Temp 98.0°F | Resp 17 | Ht 65.0 in | Wt 244.1 lb

## 2021-08-19 DIAGNOSIS — R7989 Other specified abnormal findings of blood chemistry: Secondary | ICD-10-CM | POA: Diagnosis not present

## 2021-08-19 DIAGNOSIS — E782 Mixed hyperlipidemia: Secondary | ICD-10-CM

## 2021-08-19 DIAGNOSIS — I480 Paroxysmal atrial fibrillation: Secondary | ICD-10-CM

## 2021-08-19 DIAGNOSIS — E1165 Type 2 diabetes mellitus with hyperglycemia: Secondary | ICD-10-CM

## 2021-08-19 DIAGNOSIS — K760 Fatty (change of) liver, not elsewhere classified: Secondary | ICD-10-CM | POA: Diagnosis not present

## 2021-08-19 DIAGNOSIS — I10 Essential (primary) hypertension: Secondary | ICD-10-CM | POA: Diagnosis not present

## 2021-08-19 MED ORDER — HYDRALAZINE HCL 25 MG PO TABS: 25.0000 mg | ORAL_TABLET | Freq: Three times a day (TID) | ORAL | 3 refills | Status: DC

## 2021-08-19 NOTE — Progress Notes (Signed)
Patient ID: Joy Houston, female   DOB: [birth year], 56 y.o.   MRN: [id number]   Subjective:    Patient ID: Joy Houston, female    DOB: January 26, 1966, 56 y.o.   MRN: [id number]   Patient here for a scheduled follow up.   Chief Complaint  Patient presents with   Hypertension   Diabetes   .   HPI Recently returned from a cruise.  Noticed some increased foot/ankle swelling.  Better now.  Blood pressure remains elevated.  On hydralazine 109m tid now.  No chest pain reported.  Breathing stable.  No acid reflux reported.  No increased abdominal pain or bowel change reported.  Had questions about recent labs drawn by Dr BJulien Nordmann  Discussed.     Past Medical History:  Diagnosis Date   Anemia    Angio-edema    Atrial fibrillation ([facility]    Complication of anesthesia    Diabetes mellitus without complication ([facility]    diet controlled   Dysplastic nevus 06/27/2018   Left upper back. Moderate atypia, limited margins free.    Eczema    Epstein Barr infection    Family history of anesthesia complication    Mother - confusion   Fatty liver    Heart murmur    Slight - "nothing to worry about"   Hemorrhoid    Hypertension    Hypothyroidism    PONV (postoperative nausea and vomiting)    "only with c-sections"   Sleep apnea    mild   Past Surgical History:  Procedure Laterality Date   CESAREAN SECTION     CHOLECYSTECTOMY N/A 09/07/2018   Procedure: LAPAROSCOPIC CHOLECYSTECTOMY;  Surgeon: CHerbert Pun MD;  Location: [facility];  Service: General;  Laterality: N/A;   FINGER SURGERY     pin to 3rd finger Right hand   LUMBAR LAMINECTOMY/DECOMPRESSION MICRODISCECTOMY  12/15/2011   Procedure: LUMBAR LAMINECTOMY/DECOMPRESSION MICRODISCECTOMY 1 LEVEL;  Surgeon: JOphelia Charter MD;  [facility];  Service: Neurosurgery;  Laterality: Right;  RIGHT Lumbar four-Five diskectomy   LUMBAR LAMINECTOMY/DECOMPRESSION MICRODISCECTOMY N/A 11/04/2019   Procedure: MICRODISCECTOMY  LEFT LUMBAR FIVE SACRAL ONE;  Surgeon: JNewman Pies MD;  [place]  Service: Neurosurgery;  Laterality: N/A;  3C   MICRODISCECTOMY LUMBAR     L4-5   WISDOM TOOTH EXTRACTION     Family History  Problem Relation Age of Onset   GER disease Mother    Diabetes Father    Cancer Father        Melanoma, Prostate   Heart disease Father    Heart disease Maternal Grandfather    Diabetes Paternal Grandfather    Breast cancer Sister 555  Social History   Socioeconomic History   Marital status: Married    Spouse name: Not on file   Number of children: 2   Years of education: Not on file   Highest education level: Not on file  Occupational History    Employer: [employer]  Tobacco Use   Smoking status: Never   Smokeless tobacco: Never  Vaping Use   Vaping Use: Never used  Substance and Sexual Activity   Alcohol use: Not Currently    Alcohol/week: 0.0 standard drinks of alcohol   Drug use: No   Sexual activity: Not on file  Other Topics Concern   Not on file  Social History Narrative   Nurse    Married   Social Determinants of Health   Financial Resource Strain: Not on file  Food Insecurity: Not on file  Transportation Needs: Not on file  Physical Activity: Not on file  Stress: Not on file  Social Connections: Not on file     Review of Systems  Constitutional:  Negative for appetite change and unexpected weight change.  HENT:  Negative for congestion and sinus pressure.   Respiratory:  Negative for cough, chest tightness and shortness of breath.   Cardiovascular:  Negative for chest pain and palpitations.       Swelling improved now.   Gastrointestinal:  Negative for abdominal pain, diarrhea, nausea and vomiting.  Genitourinary:  Negative for difficulty urinating and dysuria.  Musculoskeletal:  Negative for joint swelling and myalgias.  Skin:  Negative for color change and rash.  Neurological:  Negative for dizziness, light-headedness and headaches.   Psychiatric/Behavioral:  Negative for agitation and dysphoric mood.        Objective:     BP 140/84 (BP Location: Left Arm, Patient Position: Sitting, Cuff Size: Large)   Pulse 87   Temp 98 F (36.7 C) (Temporal)   Resp 17   Ht 5' 5"  (1.651 m)   Wt 244 lb 1.9 oz (110.7 kg)   SpO2 97%   BMI 40.62 kg/m  Wt Readings from Last 3 Encounters:  08/19/21 244 lb 1.9 oz (110.7 kg)  07/22/21 244 lb 12.8 oz (111 kg)  06/30/21 248 lb (112.5 kg)    Physical Exam Vitals reviewed.  Constitutional:      General: She is not in acute distress.    Appearance: Normal appearance.  HENT:     Head: Normocephalic and atraumatic.     Right Ear: External ear normal.     Left Ear: External ear normal.  Eyes:     General: No scleral icterus.       Right eye: No discharge.        Left eye: No discharge.     Conjunctiva/sclera: Conjunctivae normal.  Neck:     Thyroid: No thyromegaly.  Cardiovascular:     Rate and Rhythm: Normal rate and regular rhythm.  Pulmonary:     Effort: No respiratory distress.     Breath sounds: Normal breath sounds. No wheezing.  Abdominal:     General: Bowel sounds are normal.     Palpations: Abdomen is soft.     Tenderness: There is no abdominal tenderness.  Musculoskeletal:        General: No swelling or tenderness.     Cervical back: Neck supple. No tenderness.  Lymphadenopathy:     Cervical: No cervical adenopathy.  Skin:    Findings: No erythema or rash.  Neurological:     Mental Status: She is alert.  Psychiatric:        Mood and Affect: Mood normal.        Behavior: Behavior normal.      Outpatient Encounter Medications as of 08/19/2021  Medication Sig   Ascorbic Acid (VITAMIN C PO) Take by mouth.   EPINEPHrine (EPIPEN 2-PAK) 0.3 mg/0.3 mL IJ SOAJ injection Inject 0.3 mg into the muscle as needed for anaphylaxis.   hydrALAZINE (APRESOLINE) 25 MG tablet Take 1 tablet (25 mg total) by mouth 3 (three) times daily.   hydrocortisone 2.5 % ointment  For up to 1-2 weeks as needed.   ketoconazole (NIZORAL) 2 % cream Apply to areas of rash twice daily as needed.   metoprolol succinate (TOPROL-XL) 25 MG 24 hr tablet Take 12.5 mg by mouth daily.   NP THYROID 15 MG  tablet Take 15 mg by mouth daily.   Syringe/Needle, Disp, (SYRINGE 3CC/25GX1") 25G X 1" 3 ML MISC Use as directed with b12 injections.   VITAMIN D PO Take by mouth.   [DISCONTINUED] hydrALAZINE (APRESOLINE) 10 MG tablet Take 2 tablets tid   No facility-administered encounter medications on file as of 08/19/2021.     Lab Results  Component Value Date   WBC 7.9 01/13/2021   HGB 14.8 01/13/2021   HCT 43.4 01/13/2021   PLT 214.0 01/13/2021   GLUCOSE 104 (H) 05/07/2021   CHOL 159 05/07/2021   TRIG 92.0 05/07/2021   HDL 60.60 05/07/2021   LDLDIRECT 145.4 03/01/2013   LDLCALC 80 05/07/2021   ALT 36 (H) 05/07/2021   AST 23 05/07/2021   NA 137 05/07/2021   K 3.9 05/07/2021   CL 105 05/07/2021   CREATININE 0.72 05/07/2021   BUN 10 05/07/2021   CO2 25 05/07/2021   TSH 4.41 04/17/2020   HGBA1C 5.6 05/07/2021   MICROALBUR 1.6 01/13/2021       Assessment & Plan:   Problem List Items Addressed This Visit     Abnormal TSH - Primary   Elevated TSH    Slightly elevated tsh.  Free T3 and Free T4 wnl.  Recheck tsh and free t4 with next appt in several weeks.       Fatty liver    Previously reported on ultrasound 2020.  Recent liver panel wnl.       Hyperlipidemia, mixed    Low cholesterol diet and exercise.  Follow lipid panel.       Relevant Medications   hydrALAZINE (APRESOLINE) 25 MG tablet   Hypertension    Blood pressure remains elevated.  Current ly metoprolol and hydralazine 58m tid.  Will increase to 253mtid.  Follow pressures.  Get her back in soon to reassess.  Follow pressures.  Follow metabolic panel.       Relevant Medications   hydrALAZINE (APRESOLINE) 25 MG tablet   Paroxysmal A-fib (HCC)    S/p ablation.  Off xarelto.  Afib/symptoms improved.  On  1/2 metoprolol. Follow pressures.  Breathing better.  Have discussed anticoagulation. She has declined.        Relevant Medications   hydrALAZINE (APRESOLINE) 25 MG tablet   Type 2 diabetes mellitus with hyperglycemia (HCC)    Continue low carb diet and exercise.  Follow met b and a1c.   Lab Results  Component Value Date   HGBA1C 5.6 05/07/2021         ChEinar PheasantMD

## 2021-08-19 NOTE — Patient Instructions (Signed)
Mag oxide '400mg'$  per day

## 2021-08-21 ENCOUNTER — Encounter: Payer: Self-pay | Admitting: Internal Medicine

## 2021-08-21 DIAGNOSIS — E119 Type 2 diabetes mellitus without complications: Secondary | ICD-10-CM | POA: Diagnosis not present

## 2021-08-21 DIAGNOSIS — R7989 Other specified abnormal findings of blood chemistry: Secondary | ICD-10-CM | POA: Insufficient documentation

## 2021-08-21 NOTE — Assessment & Plan Note (Signed)
Previously reported on ultrasound 2020.  Recent liver panel wnl.  

## 2021-08-21 NOTE — Assessment & Plan Note (Signed)
Slightly elevated tsh.  Free T3 and Free T4 wnl.  Recheck tsh and free t4 with next appt in several weeks.

## 2021-08-21 NOTE — Assessment & Plan Note (Signed)
Blood pressure remains elevated.  Current ly metoprolol and hydralazine '20mg'$  tid.  Will increase to '25mg'$  tid.  Follow pressures.  Get her back in soon to reassess.  Follow pressures.  Follow metabolic panel.

## 2021-08-21 NOTE — Assessment & Plan Note (Signed)
Low cholesterol diet and exercise.  Follow lipid panel.   

## 2021-08-21 NOTE — Assessment & Plan Note (Signed)
Continue low carb diet and exercise.  Follow met b and a1c.   Lab Results  Component Value Date   HGBA1C 5.6 05/07/2021

## 2021-08-21 NOTE — Assessment & Plan Note (Signed)
S/p ablation.  Off xarelto.  Afib/symptoms improved.  On 1/2 metoprolol. Follow pressures.  Breathing better.  Have discussed anticoagulation. She has declined.

## 2021-08-24 ENCOUNTER — Other Ambulatory Visit: Payer: Self-pay | Admitting: Internal Medicine

## 2021-08-25 ENCOUNTER — Other Ambulatory Visit: Payer: Self-pay

## 2021-08-26 ENCOUNTER — Encounter: Payer: Self-pay | Admitting: Internal Medicine

## 2021-08-26 NOTE — Telephone Encounter (Signed)
Reviewed previous labs.  These were drawn prior to her visit with me.  Exam ok.  Stay hydrated.  Can schedule for a f/u met b to confirm stable/normal  

## 2021-09-02 ENCOUNTER — Other Ambulatory Visit: Payer: Self-pay

## 2021-09-02 ENCOUNTER — Other Ambulatory Visit: Payer: Self-pay | Admitting: Internal Medicine

## 2021-09-02 ENCOUNTER — Other Ambulatory Visit (INDEPENDENT_AMBULATORY_CARE_PROVIDER_SITE_OTHER): Payer: BC Managed Care – PPO

## 2021-09-02 DIAGNOSIS — I10 Essential (primary) hypertension: Secondary | ICD-10-CM

## 2021-09-02 DIAGNOSIS — R7989 Other specified abnormal findings of blood chemistry: Secondary | ICD-10-CM

## 2021-09-02 LAB — BASIC METABOLIC PANEL
BUN: 14 mg/dL (ref 6–23)
CO2: 24 mEq/L (ref 19–32)
Calcium: 9.6 mg/dL (ref 8.4–10.5)
Chloride: 103 mEq/L (ref 96–112)
Creatinine, Ser: 0.72 mg/dL (ref 0.40–1.20)
GFR: 95.03 mL/min (ref >60.00)
Glucose, Bld: 99 mg/dL (ref 70–99)
Potassium: 4.2 mEq/L (ref 3.5–5.1)
Sodium: 137 mEq/L (ref 135–145)

## 2021-09-02 NOTE — Progress Notes (Signed)
Order placed for future labs

## 2021-09-11 ENCOUNTER — Encounter: Payer: Self-pay | Admitting: Internal Medicine

## 2021-09-11 DIAGNOSIS — R197 Diarrhea, unspecified: Secondary | ICD-10-CM | POA: Diagnosis not present

## 2021-09-13 NOTE — Telephone Encounter (Signed)
Noted.  Reviewed note.  Per note, GI pathogen panel was ordered.  Do not see in system.  C.diff is negative.  Confirm doing better/ok.

## 2021-09-14 NOTE — Telephone Encounter (Signed)
S/w pt - stated not doing too much better. Able to get out of the house and do things, but still with abd discomfort and diarrhea.  Per pt results still pending on stool testing - was advised could be up to 7 days for results.  Pt requested appointment with you to f/u due to leaving for [facility] Thursday. Scheduled for tomorrow at 11am

## 2021-09-15 ENCOUNTER — Ambulatory Visit: Payer: BC Managed Care – PPO | Admitting: Internal Medicine

## 2021-09-15 ENCOUNTER — Encounter: Payer: Self-pay | Admitting: Internal Medicine

## 2021-09-15 ENCOUNTER — Telehealth: Payer: Self-pay | Admitting: Internal Medicine

## 2021-09-15 DIAGNOSIS — R197 Diarrhea, unspecified: Secondary | ICD-10-CM

## 2021-09-15 DIAGNOSIS — E1165 Type 2 diabetes mellitus with hyperglycemia: Secondary | ICD-10-CM | POA: Diagnosis not present

## 2021-09-15 DIAGNOSIS — I10 Essential (primary) hypertension: Secondary | ICD-10-CM

## 2021-09-15 DIAGNOSIS — I480 Paroxysmal atrial fibrillation: Secondary | ICD-10-CM | POA: Diagnosis not present

## 2021-09-15 NOTE — Telephone Encounter (Signed)
Check on rest of results

## 2021-09-15 NOTE — Progress Notes (Signed)
Patient ID: Joy Houston, female   DOB: [birth year], 56 y.o.   MRN: [id number]   Subjective:    Patient ID: Joy Houston, female    DOB: February 01, 1966, 56 y.o.   MRN: [id number]   Patient here for work in appt.   Chief Complaint  Patient presents with   Diarrhea   .   HPI Work in with concerns regarding diarrhea.  Planning to leave for [facility].  Wanted checked prior to traveling.  Was seen at [facility]. Stool studies obtained.  C.diff negative.  Remainder of stool studies pending.  She is concerned regarding possible giardia infection - given had previously.  States feels similar and is requesting abx.  Reports starting 7/22 - 5-6 episodes per day.  Eating - diarrhea.  No vomiting.  Right side discomfort.  Some burping.  Some increased gas with broccoli, etc.  Had three stools yesterday.  Ate her evening meal and has not had bowel movement since.     Past Medical History:  Diagnosis Date   Anemia    Angio-edema    Atrial fibrillation ([place])    Complication of anesthesia    Diabetes mellitus without complication ([place])    diet controlled   Dysplastic nevus 06/27/2018   Left upper back. Moderate atypia, limited margins free.    Eczema    Epstein Barr infection    Family history of anesthesia complication    Mother - confusion   Fatty liver    Heart murmur    Slight - "nothing to worry about"   Hemorrhoid    Hypertension    Hypothyroidism    PONV (postoperative nausea and vomiting)    "only with c-sections"   Sleep apnea    mild   Past Surgical History:  Procedure Laterality Date   CESAREAN SECTION     CHOLECYSTECTOMY N/A 09/07/2018   Procedure: LAPAROSCOPIC CHOLECYSTECTOMY;  Surgeon: Herbert Pun, MD;  Location: [facility];  Service: General;  Laterality: N/A;   FINGER SURGERY     pin to 3rd finger Right hand   LUMBAR LAMINECTOMY/DECOMPRESSION MICRODISCECTOMY  12/15/2011   Procedure: LUMBAR LAMINECTOMY/DECOMPRESSION MICRODISCECTOMY 1 LEVEL;  Surgeon:  Ophelia Charter, MD;  Location: [place] [facility];  Service: Neurosurgery;  Laterality: Right;  RIGHT Lumbar four-Five diskectomy   LUMBAR LAMINECTOMY/DECOMPRESSION MICRODISCECTOMY N/A 11/04/2019   Procedure: MICRODISCECTOMY LEFT LUMBAR FIVE SACRAL ONE;  Surgeon: Newman Pies, MD;  Location: [place];  Service: Neurosurgery;  Laterality: N/A;  3C   MICRODISCECTOMY LUMBAR     L4-5   WISDOM TOOTH EXTRACTION     Family History  Problem Relation Age of Onset   GER disease Mother    Diabetes Father    Cancer Father        Melanoma, Prostate   Heart disease Father    Heart disease Maternal Grandfather    Diabetes Paternal Grandfather    Breast cancer Sister 53   Social History   Socioeconomic History   Marital status: Married    Spouse name: Not on file   Number of children: 2   Years of education: Not on file   Highest education level: Not on file  Occupational History    Employer: [employer]  Tobacco Use   Smoking status: Never   Smokeless tobacco: Never  Vaping Use   Vaping Use: Never used  Substance and Sexual Activity   Alcohol use: Not Currently    Alcohol/week: 0.0 standard drinks of alcohol   Drug use: No  Sexual activity: Not on file  Other Topics Concern   Not on file  Social History Narrative   Nurse    Married   Social Determinants of Health   Financial Resource Strain: Not on file  Food Insecurity: Not on file  Transportation Needs: Not on file  Physical Activity: Not on file  Stress: Not on file  Social Connections: Not on file     Review of Systems  Constitutional:  Negative for appetite change and fever.  HENT:  Negative for congestion and sinus pressure.   Respiratory:  Negative for cough, chest tightness and shortness of breath.   Cardiovascular:  Negative for chest pain and palpitations.  Gastrointestinal:  Negative for diarrhea and vomiting.       Right side pain as outlined.   Genitourinary:  Negative for difficulty urinating and  dysuria.  Musculoskeletal:  Negative for joint swelling and myalgias.  Skin:  Negative for color change and rash.  Neurological:  Negative for dizziness.       No increased headache.   Psychiatric/Behavioral:  Negative for agitation and dysphoric mood.        Objective:     BP (!) 138/94   Pulse 90   Temp 98.4 F (36.9 C) (Temporal)   Resp 18   Ht _0  (1.651 m)   Wt 237 lb (107.5 kg)   SpO2 97%   BMI 39.44 kg/m  Wt Readings from Last 3 Encounters:  09/15/21 237 lb (107.5 kg)  08/19/21 244 lb 1.9 oz (110.7 kg)  07/22/21 244 lb 12.8 oz (111 kg)    Physical Exam Vitals reviewed.  Constitutional:      General: She is not in acute distress.    Appearance: Normal appearance.  HENT:     Head: Normocephalic and atraumatic.     Right Ear: External ear normal.     Left Ear: External ear normal.  Eyes:     General: No scleral icterus.       Right eye: No discharge.        Left eye: No discharge.     Conjunctiva/sclera: Conjunctivae normal.  Neck:     Thyroid: No thyromegaly.  Cardiovascular:     Rate and Rhythm: Normal rate and regular rhythm.  Pulmonary:     Effort: No respiratory distress.     Breath sounds: Normal breath sounds. No wheezing.  Abdominal:     General: Bowel sounds are normal.     Palpations: Abdomen is soft.     Tenderness: There is no abdominal tenderness.  Musculoskeletal:        General: No swelling or tenderness.     Cervical back: Neck supple. No tenderness.  Lymphadenopathy:     Cervical: No cervical adenopathy.  Skin:    Findings: No erythema or rash.  Neurological:     Mental Status: She is alert.  Psychiatric:        Mood and Affect: Mood normal.        Behavior: Behavior normal.      Outpatient Encounter Medications as of 09/15/2021  Medication Sig   Ascorbic Acid (VITAMIN C PO) Take by mouth.   EPINEPHrine (EPIPEN 2-PAK) 0.3 mg/0.3 mL IJ SOAJ injection Inject 0.3 mg into the muscle as needed for anaphylaxis.   hydrALAZINE  (APRESOLINE) 25 MG tablet Take 1 tablet (25 mg total) by mouth 3 (three) times daily.   hydrocortisone 2.5 % ointment For up to 1-2 weeks as needed.   ketoconazole (NIZORAL) 2 %  cream Apply to areas of rash twice daily as needed.   metoprolol succinate (TOPROL-XL) 25 MG 24 hr tablet Take 12.5 mg by mouth daily.   NP THYROID 15 MG tablet Take 15 mg by mouth daily.   Syringe/Needle, Disp, (SYRINGE 3CC/25GX1") 25G X 1" 3 ML MISC Use as directed with b12 injections.   VITAMIN D PO Take by mouth.   No facility-administered encounter medications on file as of 09/15/2021.     Lab Results  Component Value Date   WBC 9.1 07/16/2021   HGB 15.2 07/16/2021   HCT 46 07/16/2021   PLT 238 07/16/2021   GLUCOSE 99 09/02/2021   CHOL 159 05/07/2021   TRIG 92.0 05/07/2021   HDL 60.60 05/07/2021   LDLDIRECT 145.4 03/01/2013   LDLCALC 80 05/07/2021   ALT 30 07/16/2021   AST 30 07/16/2021   NA 137 09/02/2021   K 4.2 09/02/2021   CL 103 09/02/2021   CREATININE 0.72 09/02/2021   BUN 14 09/02/2021   CO2 24 09/02/2021   TSH 5.00 07/16/2021   HGBA1C 5.6 05/07/2021   MICROALBUR 1.6 01/13/2021       Assessment & Plan:   Problem List Items Addressed This Visit     Diarrhea    She has had persistent problems with diarrhea. (Ongoing for at least 3 years). Symptoms seem to have been more noticeable after cholecystectomy 08/2018.  Work up to date has included - TTgIGA negative.  Lipase wnl.  CT abd/pelvis - diverticulosis, otherwise negative. Colonoscopy negative for microscopic colitis.  TSH/T4 negative.  No weight loss.  Eating.  No nausea or vomiting.  Feels diarrhea has worsened recently.  Had stool test for O&P and c.diff. c.diff negative.  Remainder of stool studies pending.  Discussed her diarrhea and above.  Appears to be improved.  Discussed probiotics and fiber.  Discussed holding on abx until know what treating.  Obtained results of stool studies.        Hypertension    Blood pressure remains  elevated.  Currently metoprolol and hydralazine.  Follow pressures.  Get her back in soon to reassess.  Follow pressures.  Follow metabolic panel.       Paroxysmal A-fib [facility])    S/p ablation.  Off xarelto.  Afib/symptoms improved.  On 1/2 metoprolol. Follow pressures.  Breathing better.  Have previously discussed anticoagulation. She has declined.        Type 2 diabetes mellitus with hyperglycemia (HCC)    Continue low carb diet and exercise.  Follow met b and a1c.   Lab Results  Component Value Date   HGBA1C 5.6 05/07/2021         Einar Pheasant, MD

## 2021-09-15 NOTE — Telephone Encounter (Signed)
Pt advised - will await result of results

## 2021-09-15 NOTE — Telephone Encounter (Signed)
Notify Joy Houston that her stool test for ova, cysts and parasites is negative.  The white blood cells and culture is pending.

## 2021-09-20 NOTE — Telephone Encounter (Signed)
The stool study results we received appear to be negative.  Joy Houston should be contacting her with results.  Confirm doing ok.

## 2021-09-20 NOTE — Telephone Encounter (Signed)
Pt advised results normal

## 2021-09-20 NOTE — Telephone Encounter (Signed)
Wbc and stool culture results are in - placed in result folder for review

## 2021-09-21 DIAGNOSIS — E119 Type 2 diabetes mellitus without complications: Secondary | ICD-10-CM | POA: Diagnosis not present

## 2021-09-26 ENCOUNTER — Encounter: Payer: Self-pay | Admitting: Internal Medicine

## 2021-09-26 NOTE — Assessment & Plan Note (Signed)
Blood pressure remains elevated.  Currently metoprolol and hydralazine.  Follow pressures.  Get her back in soon to reassess.  Follow pressures.  Follow metabolic panel.

## 2021-09-26 NOTE — Assessment & Plan Note (Signed)
S/p ablation.  Off xarelto.  Afib/symptoms improved.  On 1/2 metoprolol. Follow pressures.  Breathing better.  Have previously discussed anticoagulation. She has declined.

## 2021-09-26 NOTE — Assessment & Plan Note (Signed)
She has had persistent problems with diarrhea. (Ongoing for at least 3 years). Symptoms seem to have been more noticeable after cholecystectomy 08/2018.  Work up to date has included - TTgIGA negative.  Lipase wnl.  CT abd/pelvis - diverticulosis, otherwise negative. Colonoscopy negative for microscopic colitis.  TSH/T4 negative.  No weight loss.  Eating.  No nausea or vomiting.  Feels diarrhea has worsened recently.  Had stool test for O&P and c.diff. c.diff negative.  Remainder of stool studies pending.  Discussed her diarrhea and above.  Appears to be improved.  Discussed probiotics and fiber.  Discussed holding on abx until know what treating.  Obtained results of stool studies.

## 2021-09-26 NOTE — Assessment & Plan Note (Signed)
Continue low carb diet and exercise.  Follow met b and a1c.   Lab Results  Component Value Date   HGBA1C 5.6 05/07/2021   

## 2021-09-27 ENCOUNTER — Ambulatory Visit: Payer: BC Managed Care – PPO | Admitting: Internal Medicine

## 2021-09-27 ENCOUNTER — Encounter: Payer: Self-pay | Admitting: Internal Medicine

## 2021-09-27 VITALS — BP 138/86 | HR 82 | Temp 98.1°F | Resp 14 | Ht 65.0 in | Wt 239.0 lb

## 2021-09-27 DIAGNOSIS — E782 Mixed hyperlipidemia: Secondary | ICD-10-CM

## 2021-09-27 DIAGNOSIS — E1165 Type 2 diabetes mellitus with hyperglycemia: Secondary | ICD-10-CM

## 2021-09-27 DIAGNOSIS — R195 Other fecal abnormalities: Secondary | ICD-10-CM | POA: Diagnosis not present

## 2021-09-27 DIAGNOSIS — I480 Paroxysmal atrial fibrillation: Secondary | ICD-10-CM

## 2021-09-27 DIAGNOSIS — K760 Fatty (change of) liver, not elsewhere classified: Secondary | ICD-10-CM

## 2021-09-27 DIAGNOSIS — I10 Essential (primary) hypertension: Secondary | ICD-10-CM | POA: Diagnosis not present

## 2021-09-27 DIAGNOSIS — R197 Diarrhea, unspecified: Secondary | ICD-10-CM

## 2021-09-27 DIAGNOSIS — E559 Vitamin D deficiency, unspecified: Secondary | ICD-10-CM

## 2021-09-27 LAB — HEMOGLOBIN A1C: Hgb A1c MFr Bld: 5.6 % (ref 4.6–6.5)

## 2021-09-27 NOTE — Progress Notes (Signed)
Patient ID: Joy Houston, female   DOB: 06/11/1965, 56 y.o.   MRN: [id number]   Subjective:    Patient ID: Joy Houston, female    DOB: [birth year], 56 y.o.   MRN: [id number]   Patient here for a scheduled follow up. Marland Kitchen   HPI Recently evaluated for persistent loose stool.  See last note.  Stool test were negative. She was evaluated the following day (after last visit with me) by Dr Jimmye Norman.  He was concerned she had long covid.  Was prescribed ivermectin and naltrexone.  She is taking these medications.  Apparently titrating up the dose of naltrexone.  Has f/u with him on 10/07/21.  She reports her bowels are better.  Also reports stiffness seems to have improved and the right side pain is better.  Eating. No nausea or vomiting.  Breathing stable. Reports headaches are better.  Desires lab check.    Past Medical History:  Diagnosis Date   Anemia    Angio-edema    Atrial fibrillation ([place])    Complication of anesthesia    Diabetes mellitus without complication ([place])    diet controlled   Dysplastic nevus 06/27/2018   Left upper back. Moderate atypia, limited margins free.    Eczema    Epstein Barr infection    Family history of anesthesia complication    Mother - confusion   Fatty liver    Heart murmur    Slight - "nothing to worry about"   Hemorrhoid    Hypertension    Hypothyroidism    PONV (postoperative nausea and vomiting)    "only with c-sections"   Sleep apnea    mild   Past Surgical History:  Procedure Laterality Date   CESAREAN SECTION     CHOLECYSTECTOMY N/A 09/07/2018   Procedure: LAPAROSCOPIC CHOLECYSTECTOMY;  Surgeon: Herbert Pun, MD;  Location: [facility] ORS;  Service: General;  Laterality: N/A;   FINGER SURGERY     pin to 3rd finger Right hand   LUMBAR LAMINECTOMY/DECOMPRESSION MICRODISCECTOMY  12/15/2011   Procedure: LUMBAR LAMINECTOMY/DECOMPRESSION MICRODISCECTOMY 1 LEVEL;  Surgeon: Ophelia Charter, MD;  Location: [place] [facility];  Service:  Neurosurgery;  Laterality: Right;  RIGHT Lumbar four-Five diskectomy   LUMBAR LAMINECTOMY/DECOMPRESSION MICRODISCECTOMY N/A 11/04/2019   Procedure: MICRODISCECTOMY LEFT LUMBAR FIVE SACRAL ONE;  Surgeon: Newman Pies, MD;  Location: [place];  Service: Neurosurgery;  Laterality: N/A;  3C   MICRODISCECTOMY LUMBAR     L4-5   WISDOM TOOTH EXTRACTION     Family History  Problem Relation Age of Onset   GER disease Mother    Diabetes Father    Cancer Father        Melanoma, Prostate   Heart disease Father    Heart disease Maternal Grandfather    Diabetes Paternal Grandfather    Breast cancer Sister 51   Social History   Socioeconomic History   Marital status: Married    Spouse name: Not on file   Number of children: 2   Years of education: Not on file   Highest education level: Not on file  Occupational History    Employer: [employer]  Tobacco Use   Smoking status: Never   Smokeless tobacco: Never  Vaping Use   Vaping Use: Never used  Substance and Sexual Activity   Alcohol use: Not Currently    Alcohol/week: 0.0 standard drinks of alcohol   Drug use: No   Sexual activity: Not on file  Other Topics Concern   Not on  file  Social History Narrative   Nurse    Married   Social Determinants of Health   Financial Resource Strain: Not on file  Food Insecurity: Not on file  Transportation Needs: Not on file  Physical Activity: Not on file  Stress: Not on file  Social Connections: Not on file     Review of Systems  Constitutional:  Negative for appetite change and unexpected weight change.  HENT:  Negative for congestion and sinus pressure.   Respiratory:  Negative for cough, chest tightness and shortness of breath.   Cardiovascular:  Negative for chest pain, palpitations and leg swelling.  Gastrointestinal:  Negative for abdominal pain, nausea and vomiting.  Genitourinary:  Negative for difficulty urinating and dysuria.  Musculoskeletal:  Negative for joint  swelling and myalgias.  Skin:  Negative for color change and rash.  Neurological:  Negative for dizziness, light-headedness and headaches.  Psychiatric/Behavioral:  Negative for agitation and dysphoric mood.        Objective:     BP 138/86 (BP Location: Left Arm, Patient Position: Sitting, Cuff Size: Large)   Pulse 82   Temp 98.1 F (36.7 C) (Temporal)   Resp 14   Ht _0  (1.651 m)   Wt 239 lb (108.4 kg)   SpO2 99%   BMI 39.77 kg/m  Wt Readings from Last 3 Encounters:  09/27/21 239 lb (108.4 kg)  09/15/21 237 lb (107.5 kg)  08/19/21 244 lb 1.9 oz (110.7 kg)    Physical Exam Vitals reviewed.  Constitutional:      General: She is not in acute distress.    Appearance: Normal appearance.  HENT:     Head: Normocephalic and atraumatic.     Right Ear: External ear normal.     Left Ear: External ear normal.  Eyes:     General: No scleral icterus.       Right eye: No discharge.        Left eye: No discharge.     Conjunctiva/sclera: Conjunctivae normal.  Neck:     Thyroid: No thyromegaly.  Cardiovascular:     Rate and Rhythm: Normal rate and regular rhythm.  Pulmonary:     Effort: No respiratory distress.     Breath sounds: Normal breath sounds. No wheezing.  Abdominal:     General: Bowel sounds are normal.     Palpations: Abdomen is soft.     Tenderness: There is no abdominal tenderness.  Musculoskeletal:        General: No swelling or tenderness.     Cervical back: Neck supple. No tenderness.  Lymphadenopathy:     Cervical: No cervical adenopathy.  Skin:    Findings: No erythema or rash.  Neurological:     Mental Status: She is alert.  Psychiatric:        Mood and Affect: Mood normal.        Behavior: Behavior normal.      Outpatient Encounter Medications as of 09/27/2021  Medication Sig   EPINEPHrine (EPIPEN 2-PAK) 0.3 mg/0.3 mL IJ SOAJ injection Inject 0.3 mg into the muscle as needed for anaphylaxis.   hydrALAZINE (APRESOLINE) 10 MG tablet Take 10 mg by  mouth 3 (three) times daily.   ivermectin (STROMECTOL) 3 MG TABS tablet Take by mouth once.   ketoconazole (NIZORAL) 2 % cream Apply to areas of rash twice daily as needed.   metoprolol succinate (TOPROL-XL) 25 MG 24 hr tablet Take 12.5 mg by mouth daily.   NALTREXONE HCL PO Take  0.5 mg by mouth daily.   Syringe/Needle, Disp, (SYRINGE 3CC/25GX1") 25G X 1" 3 ML MISC Use as directed with b12 injections.   VITAMIN D PO Take by mouth.   [DISCONTINUED] hydrALAZINE (APRESOLINE) 25 MG tablet Take 1 tablet (25 mg total) by mouth 3 (three) times daily.   [DISCONTINUED] naltrexone (DEPADE) 50 MG tablet Take by mouth daily.   [DISCONTINUED] Ascorbic Acid (VITAMIN C PO) Take by mouth. (Patient not taking: Reported on 09/27/2021)   [DISCONTINUED] hydrocortisone 2.5 % ointment For up to 1-2 weeks as needed. (Patient not taking: Reported on 09/27/2021)   [DISCONTINUED] NP THYROID 15 MG tablet Take 15 mg by mouth daily. (Patient not taking: Reported on 09/27/2021)   No facility-administered encounter medications on file as of 09/27/2021.     Lab Results  Component Value Date   WBC 9.1 07/16/2021   HGB 15.2 07/16/2021   HCT 46 07/16/2021   PLT 238 07/16/2021   GLUCOSE 87 09/27/2021   CHOL 159 05/07/2021   TRIG 92.0 05/07/2021   HDL 60.60 05/07/2021   LDLDIRECT 145.4 03/01/2013   LDLCALC 80 05/07/2021   ALT 30 07/16/2021   AST 30 07/16/2021   NA 139 09/27/2021   K 4.3 09/27/2021   CL 103 09/27/2021   CREATININE 0.74 09/27/2021   BUN 13 09/27/2021   CO2 24 09/27/2021   TSH 5.00 07/16/2021   HGBA1C 5.6 09/27/2021   MICROALBUR 1.6 01/13/2021       Assessment & Plan:   Problem List Items Addressed This Visit     Diarrhea    Recent stool studies negative.  She has had persistent problems with diarrhea. (Ongoing for at least 3 years). Symptoms seem to have been more noticeable after cholecystectomy 08/2018.  Work up to date has included - TTgIGA negative.  Lipase wnl.  CT abd/pelvis - diverticulosis,  otherwise negative. Colonoscopy negative for microscopic colitis.  TSH/T4 negative.  No weight loss.  Eating.  No nausea or vomiting.   Discussed history of loose stools.  Appears to be improved.  Declines cholestyramine.       Fatty liver    Previously reported on ultrasound 2020.  Recent liver panel wnl.       Hyperlipidemia, mixed    Low cholesterol diet and exercise.  Follow lipid panel.       Relevant Medications   hydrALAZINE (APRESOLINE) 10 MG tablet   Hypertension - Primary    Blood pressure remains elevated, but has improved. Currently metoprolol and hydralazine.  Follow pressures. Follow metabolic panel.       Relevant Medications   hydrALAZINE (APRESOLINE) 10 MG tablet   Other Relevant Orders   Basic metabolic panel (Completed)   Loose stools   Relevant Orders   Basic metabolic panel (Completed)   Magnesium (Completed)   Paroxysmal A-fib ([place])    S/p ablation.  Off xarelto.  Afib/symptoms improved.  On 1/2 metoprolol. Follow pressures.  Breathing better.  Have discussed anticoagulation. She has declined.        Relevant Medications   hydrALAZINE (APRESOLINE) 10 MG tablet   Type 2 diabetes mellitus with hyperglycemia (HCC)    Continue low carb diet and exercise.  Follow met b and a1c.   Lab Results  Component Value Date   HGBA1C 5.6 09/27/2021       Relevant Orders   Hemoglobin A1c (Completed)   Vitamin D deficiency    Vitamin D level wnl 06/2021.         Einar Pheasant,  MD

## 2021-09-28 LAB — BASIC METABOLIC PANEL
BUN: 13 mg/dL (ref 6–23)
CO2: 24 mEq/L (ref 19–32)
Calcium: 9.6 mg/dL (ref 8.4–10.5)
Chloride: 103 mEq/L (ref 96–112)
Creatinine, Ser: 0.74 mg/dL (ref 0.40–1.20)
GFR: 91.91 mL/min (ref >60.00)
Glucose, Bld: 87 mg/dL (ref 70–99)
Potassium: 4.3 mEq/L (ref 3.5–5.1)
Sodium: 139 mEq/L (ref 135–145)

## 2021-09-28 LAB — MAGNESIUM: Magnesium: 1.9 mg/dL (ref 1.5–2.5)

## 2021-09-30 DIAGNOSIS — Z713 Dietary counseling and surveillance: Secondary | ICD-10-CM | POA: Diagnosis not present

## 2021-09-30 DIAGNOSIS — K760 Fatty (change of) liver, not elsewhere classified: Secondary | ICD-10-CM | POA: Diagnosis not present

## 2021-09-30 DIAGNOSIS — R197 Diarrhea, unspecified: Secondary | ICD-10-CM | POA: Diagnosis not present

## 2021-10-04 ENCOUNTER — Encounter: Payer: Self-pay | Admitting: Internal Medicine

## 2021-10-04 NOTE — Assessment & Plan Note (Signed)
Vitamin D level wnl 06/2021.  

## 2021-10-04 NOTE — Assessment & Plan Note (Signed)
Blood pressure remains elevated, but has improved. Currently metoprolol and hydralazine.  Follow pressures. Follow metabolic panel.

## 2021-10-04 NOTE — Assessment & Plan Note (Signed)
Low cholesterol diet and exercise.  Follow lipid panel.   

## 2021-10-04 NOTE — Assessment & Plan Note (Signed)
Previously reported on ultrasound 2020.  Recent liver panel wnl.  

## 2021-10-04 NOTE — Assessment & Plan Note (Signed)
Continue low carb diet and exercise.  Follow met b and a1c.   Lab Results  Component Value Date   HGBA1C 5.6 09/27/2021

## 2021-10-04 NOTE — Assessment & Plan Note (Addendum)
Recent stool studies negative.  She has had persistent problems with diarrhea. (Ongoing for at least 3 years). Symptoms seem to have been more noticeable after cholecystectomy 08/2018.  Work up to date has included - TTgIGA negative.  Lipase wnl.  CT abd/pelvis - diverticulosis, otherwise negative. Colonoscopy negative for microscopic colitis.  TSH/T4 negative.  No weight loss.  Eating.  No nausea or vomiting.   Discussed history of loose stools.  Appears to be improved.  Declines cholestyramine.

## 2021-10-04 NOTE — Assessment & Plan Note (Signed)
S/p ablation.  Off xarelto.  Afib/symptoms improved.  On 1/2 metoprolol. Follow pressures.  Breathing better.  Have discussed anticoagulation. She has declined.

## 2021-10-22 DIAGNOSIS — E119 Type 2 diabetes mellitus without complications: Secondary | ICD-10-CM | POA: Diagnosis not present

## 2021-11-04 ENCOUNTER — Other Ambulatory Visit: Payer: Self-pay | Admitting: Internal Medicine

## 2021-11-11 ENCOUNTER — Other Ambulatory Visit: Payer: Self-pay | Admitting: Internal Medicine

## 2021-11-12 NOTE — Telephone Encounter (Signed)
Patient states she was taking 10 mg 2 tablet 3 times daily. She has another RX of 25 mg and as she got tired of having to take 6 tablets daily she has been taking 25 mg tablet 1 in the am and 1 in the pm and then 10 mg 1 tablet at noon, so she would just take 3 tablets vs 6 daily. Patient has an appointment on Monday 11/15/21 and is ok with discussing this then and reviewing refills/medication changes.

## 2021-11-12 NOTE — Telephone Encounter (Signed)
Please confirm with Brigida dose of hydralazine she is currently taking and correct our med list.  Ok to refill.  Just need to clarify dose.

## 2021-11-15 ENCOUNTER — Encounter: Payer: Self-pay | Admitting: Internal Medicine

## 2021-11-15 ENCOUNTER — Ambulatory Visit: Payer: BC Managed Care – PPO | Admitting: Internal Medicine

## 2021-11-15 DIAGNOSIS — I480 Paroxysmal atrial fibrillation: Secondary | ICD-10-CM

## 2021-11-15 DIAGNOSIS — I10 Essential (primary) hypertension: Secondary | ICD-10-CM | POA: Diagnosis not present

## 2021-11-15 DIAGNOSIS — E1165 Type 2 diabetes mellitus with hyperglycemia: Secondary | ICD-10-CM

## 2021-11-15 DIAGNOSIS — E782 Mixed hyperlipidemia: Secondary | ICD-10-CM

## 2021-11-15 DIAGNOSIS — K760 Fatty (change of) liver, not elsewhere classified: Secondary | ICD-10-CM | POA: Diagnosis not present

## 2021-11-15 DIAGNOSIS — G4733 Obstructive sleep apnea (adult) (pediatric): Secondary | ICD-10-CM | POA: Diagnosis not present

## 2021-11-15 DIAGNOSIS — R197 Diarrhea, unspecified: Secondary | ICD-10-CM

## 2021-11-15 DIAGNOSIS — R600 Localized edema: Secondary | ICD-10-CM

## 2021-11-15 MED ORDER — HYDRALAZINE HCL 25 MG PO TABS: 25.0000 mg | ORAL_TABLET | Freq: Two times a day (BID) | ORAL | 3 refills | Status: DC

## 2021-11-15 MED ORDER — HYDRALAZINE HCL 10 MG PO TABS: ORAL_TABLET | ORAL | 3 refills | Status: DC

## 2021-11-15 NOTE — Progress Notes (Signed)
Patient ID: Joy Houston, female   DOB: [birth year], 56 y.o.   MRN: [id number]   Subjective:    Patient ID: Joy Houston, female    DOB: [birth year], 56 y.o.   MRN: [id number]   Patient here for  Chief Complaint  Patient presents with   Follow-up    7 week follow up   .   HPI Here to follow up regarding her blood pressure, blood sugar and cholesterol.  Has been having some pain - left knee.  When up and moving - better.  Worse if sitting for long period of time.  Recently to beach- feet and ankle swelling. Seeing Dr Jimmye Norman.  Has her on ivermectin and naltrexone.  She feels better.  Feels joints are better and feels has more energy.  States blood pressures averaging 130-140s/80s.  Taking hydralazine.  Request 63m in am 17mmidday and 2548mn pm - regimen.  Discussed continued diet and exercise.  Appears to be dong better from cardiac standpoint.  Breathing stable.    Past Medical History:  Diagnosis Date   Anemia    Angio-edema    [place]  Complication of anesthesia    Di[place]  diet controlled   Dysplastic nevus 06/27/2018   Left upper back. Moderate atypia, limited margins free.    Eczema    Epstein Barr infection    Family history of anesthesia complication    Mother - confusion   Fatty liver    Heart murmur    Slight - "nothing to worry about"   Hemorrhoid    Hypertension    Hypothyroidism    PONV (postoperative nausea and vomiting)    "only with c-sections"   Sleep apnea    mild   Past Surgical History:  Procedure Laterality Date   CESAREAN SECTION     CHOLECYSTECTOMY N/A 09/07/2018   Procedure: LAPAROSCOPIC CHOLECYSTECTOMY;  Surgeon: CinHerbert PunD;  Location: [facility] ORS;  Service: General;  Laterality: N/A;   FINGER SURGERY     pin to 3rd finger Right hand   LUMBAR LAMINECTOMY/DECOMPRESSION MICRODISCECTOMY  12/15/2011   Procedure: LUMBAR LAMINECTOMY/DECOMPRESSION MICRODISCECTOMY 1  LEVEL;  Surgeon: JefOphelia CharterD;  [facility];  Service: Neurosurgery;  Laterality: Right;  RIGHT Lumbar four-Five diskectomy   LUMBAR LAMINECTOMY/DECOMPRESSION MICRODISCECTOMY N/A 11/04/2019   Procedure: MICRODISCECTOMY LEFT LUMBAR FIVE SACRAL ONE;  Surgeon: JenNewman PiesD;  [facility];  Laterality: N/A;  3C   MICRODISCECTOMY LUMBAR     L4-5   WISDOM TOOTH EXTRACTION     Family History  Problem Relation Age of Onset   GER disease Mother    Diabetes Father    Cancer Father        Melanoma, Prostate   Heart disease Father    Heart disease Maternal Grandfather    Diabetes Paternal Grandfather    Breast cancer Sister 54 52Social History   Socioeconomic History   Marital status: Married    Spouse name: Not on file   Number of children: 2   Years of education: Not on file   Highest education level: Not on file  Occupational History    Employer: [employer]  Tobacco Use   Smoking status: Never   Smokeless tobacco: Never  Vaping Use   Vaping Use: Never used  Substance and Sexual Activity   Alcohol use: Not Currently    Alcohol/week: 0.0 standard drinks of  alcohol   Drug use: No   Sexual activity: Not on file  Other Topics Concern   Not on file  Social History Narrative   Nurse    Married   Social Determinants of Health   Financial Resource Strain: Not on file  Food Insecurity: Not on file  Transportation Needs: Not on file  Physical Activity: Not on file  Stress: Not on file  Social Connections: Not on file     Review of Systems  Constitutional:  Negative for appetite change and unexpected weight change.  HENT:  Negative for congestion and sinus pressure.   Respiratory:  Negative for cough and chest tightness.        Breathing stable.   Cardiovascular:  Negative for chest pain and palpitations.       Reports foot and ankle swelling.   Gastrointestinal:  Negative for abdominal pain, diarrhea, nausea and  vomiting.  Genitourinary:  Negative for difficulty urinating and dysuria.  Musculoskeletal:  Negative for joint swelling and myalgias.       Knee pain as outlined.   Skin:  Negative for color change and rash.  Neurological:  Negative for dizziness, light-headedness and headaches.  Psychiatric/Behavioral:  Negative for agitation and dysphoric mood.        Objective:     BP 136/80 (BP Location: Left Arm, Patient Position: Sitting, Cuff Size: Normal)   Pulse 81   Temp 98.7 F (37.1 C) (Oral)   Ht 5' 5"  (1.651 m)   Wt 238 lb 3.2 oz (108 kg)   SpO2 95%   BMI 39.64 kg/m  Wt Readings from Last 3 Encounters:  11/15/21 238 lb 3.2 oz (108 kg)  09/27/21 239 lb (108.4 kg)  09/15/21 237 lb (107.5 kg)    Physical Exam   Outpatient Encounter Medications as of 11/15/2021  Medication Sig   EPINEPHrine (EPIPEN 2-PAK) 0.3 mg/0.3 mL IJ SOAJ injection Inject 0.3 mg into the muscle as needed for anaphylaxis.   hydrALAZINE (APRESOLINE) 10 MG tablet Take one daily - midday   hydrALAZINE (APRESOLINE) 25 MG tablet Take 1 tablet (25 mg total) by mouth in the morning and at bedtime.   ivermectin (STROMECTOL) 3 MG TABS tablet Take by mouth once.   ketoconazole (NIZORAL) 2 % cream Apply to areas of rash twice daily as needed.   metoprolol succinate (TOPROL-XL) 25 MG 24 hr tablet Take 12.5 mg by mouth daily.   NALTREXONE HCL PO Take 1 mg by mouth daily.   Syringe/Needle, Disp, (SYRINGE 3CC/25GX1") 25G X 1" 3 ML MISC Use as directed with b12 injections.   VITAMIN D PO Take by mouth.   [DISCONTINUED] hydrALAZINE (APRESOLINE) 10 MG tablet Take 10 mg by mouth 3 (three) times daily.   No facility-administered encounter medications on file as of 11/15/2021.     Lab Results  Component Value Date   WBC 9.1 07/16/2021   HGB 15.2 07/16/2021   HCT 46 07/16/2021   PLT 238 07/16/2021   GLUCOSE 87 09/27/2021   CHOL 159 05/07/2021   TRIG 92.0 05/07/2021   HDL 60.60 05/07/2021   LDLDIRECT 145.4 03/01/2013    LDLCALC 80 05/07/2021   ALT 30 07/16/2021   AST 30 07/16/2021   NA 139 09/27/2021   K 4.3 09/27/2021   CL 103 09/27/2021   CREATININE 0.74 09/27/2021   BUN 13 09/27/2021   CO2 24 09/27/2021   TSH 5.00 07/16/2021   HGBA1C 5.6 09/27/2021   MICROALBUR 1.6 01/13/2021  Assessment & Plan:   Problem List Items Addressed This Visit     Diarrhea    Last stool studies negative.  She has had persistent problems with diarrhea. (Ongoing for years). Symptoms seem to have been more noticeable after cholecystectomy 08/2018.  Work up to date has included - TTgIGA negative.  Lipase wnl.  CT abd/pelvis - diverticulosis, otherwise negative. Colonoscopy negative for microscopic colitis.  TSH/T4 negative.  No weight loss.  Eating.  No nausea or vomiting.   Discussed history of loose stools.  Reports some improvement.  Declines cholestyramine.       Fatty liver    Previously reported on ultrasound 2020.  Recent liver panel wnl.       Hyperlipidemia, mixed    Low cholesterol diet and exercise.  Follow lipid panel.       Relevant Medications   hydrALAZINE (APRESOLINE) 25 MG tablet   hydrALAZINE (APRESOLINE) 10 MG tablet   Hypertension    Blood pressure remains elevated, but has improved. Currently metoprolol and hydralazine. Request dosing regiment as outlined.  Follow pressures. Follow metabolic panel.       Relevant Medications   hydrALAZINE (APRESOLINE) 25 MG tablet   hydrALAZINE (APRESOLINE) 10 MG tablet   OSA (obstructive sleep apnea)    Has oral device.       Paroxysmal A-fib [facility])    S/p ablation.  Off xarelto.  Afib/symptoms improved.  On 1/2 metoprolol. Follow pressures.  Breathing better.  Have discussed anticoagulation. She has declined.        Relevant Medications   hydrALAZINE (APRESOLINE) 25 MG tablet   hydrALAZINE (APRESOLINE) 10 MG tablet   Pedal edema    Pedal and ankle edema.  No swelling extending up the leg.  No redness.  Compression hose.  Leg elevation. Monitor  salt intake.        Type 2 diabetes mellitus with hyperglycemia (HCC)    Continue low carb diet and exercise.  Follow met b and a1c.   Lab Results  Component Value Date   HGBA1C 5.6 09/27/2021         Einar Pheasant, MD

## 2021-11-21 ENCOUNTER — Encounter: Payer: Self-pay | Admitting: Internal Medicine

## 2021-11-21 DIAGNOSIS — E119 Type 2 diabetes mellitus without complications: Secondary | ICD-10-CM | POA: Diagnosis not present

## 2021-11-21 NOTE — Assessment & Plan Note (Signed)
Pedal and ankle edema.  No swelling extending up the leg.  No redness.  Compression hose.  Leg elevation. Monitor salt intake.

## 2021-11-21 NOTE — Assessment & Plan Note (Signed)
Previously reported on ultrasound 2020.  Recent liver panel wnl.  

## 2021-11-21 NOTE — Assessment & Plan Note (Signed)
Last stool studies negative.  She has had persistent problems with diarrhea. (Ongoing for years). Symptoms seem to have been more noticeable after cholecystectomy 08/2018.  Work up to date has included - TTgIGA negative.  Lipase wnl.  CT abd/pelvis - diverticulosis, otherwise negative. Colonoscopy negative for microscopic colitis.  TSH/T4 negative.  No weight loss.  Eating.  No nausea or vomiting.   Discussed history of loose stools.  Reports some improvement.  Declines cholestyramine.  

## 2021-11-21 NOTE — Assessment & Plan Note (Signed)
Continue low carb diet and exercise.  Follow met b and a1c.   Lab Results  Component Value Date   HGBA1C 5.6 09/27/2021

## 2021-11-21 NOTE — Assessment & Plan Note (Signed)
Blood pressure remains elevated, but has improved. Currently metoprolol and hydralazine. Request dosing regiment as outlined.  Follow pressures. Follow metabolic panel.

## 2021-11-21 NOTE — Assessment & Plan Note (Signed)
S/p ablation.  Off xarelto.  Afib/symptoms improved.  On 1/2 metoprolol. Follow pressures.  Breathing better.  Have discussed anticoagulation. She has declined.

## 2021-11-21 NOTE — Assessment & Plan Note (Signed)
Has oral device.

## 2021-11-21 NOTE — Assessment & Plan Note (Signed)
Low cholesterol diet and exercise.  Follow lipid panel.   

## 2021-11-23 DIAGNOSIS — Z713 Dietary counseling and surveillance: Secondary | ICD-10-CM | POA: Diagnosis not present

## 2021-11-30 DIAGNOSIS — Z6839 Body mass index (BMI) 39.0-39.9, adult: Secondary | ICD-10-CM | POA: Diagnosis not present

## 2021-11-30 DIAGNOSIS — R197 Diarrhea, unspecified: Secondary | ICD-10-CM | POA: Diagnosis not present

## 2021-12-22 DIAGNOSIS — E119 Type 2 diabetes mellitus without complications: Secondary | ICD-10-CM | POA: Diagnosis not present

## 2021-12-30 ENCOUNTER — Ambulatory Visit: Payer: BC Managed Care – PPO | Admitting: Dermatology

## 2021-12-30 DIAGNOSIS — L720 Epidermal cyst: Secondary | ICD-10-CM

## 2021-12-30 DIAGNOSIS — Z1283 Encounter for screening for malignant neoplasm of skin: Secondary | ICD-10-CM

## 2021-12-30 DIAGNOSIS — D229 Melanocytic nevi, unspecified: Secondary | ICD-10-CM

## 2021-12-30 DIAGNOSIS — D239 Other benign neoplasm of skin, unspecified: Secondary | ICD-10-CM

## 2021-12-30 DIAGNOSIS — L578 Other skin changes due to chronic exposure to nonionizing radiation: Secondary | ICD-10-CM

## 2021-12-30 DIAGNOSIS — Z86018 Personal history of other benign neoplasm: Secondary | ICD-10-CM

## 2021-12-30 DIAGNOSIS — N898 Other specified noninflammatory disorders of vagina: Secondary | ICD-10-CM | POA: Diagnosis not present

## 2021-12-30 DIAGNOSIS — B353 Tinea pedis: Secondary | ICD-10-CM | POA: Diagnosis not present

## 2021-12-30 DIAGNOSIS — D235 Other benign neoplasm of skin of trunk: Secondary | ICD-10-CM

## 2021-12-30 DIAGNOSIS — L821 Other seborrheic keratosis: Secondary | ICD-10-CM

## 2021-12-30 DIAGNOSIS — L918 Other hypertrophic disorders of the skin: Secondary | ICD-10-CM

## 2021-12-30 DIAGNOSIS — L814 Other melanin hyperpigmentation: Secondary | ICD-10-CM

## 2021-12-30 NOTE — Progress Notes (Signed)
Follow-Up Visit   Subjective  Joy Houston is a 56 y.o. female who presents for the following: Annual Exam (Hx dysplastic nevi. Small scaly spot at left ear, skin tags at groin.).  The patient presents for Total-Body Skin Exam (TBSE) for skin cancer screening and mole check.  The patient has spots, moles and lesions to be evaluated, some may be new or changing and the patient has concerns that these could be cancer.   The following portions of the chart were reviewed this encounter and updated as appropriate:   Tobacco  Allergies  Meds  Problems  Med Hx  Surg Hx  Fam Hx      Review of Systems:  No other skin or systemic complaints except as noted in HPI or Assessment and Plan.  Objective  Well appearing patient in no apparent distress; mood and affect are within normal limits.  A full examination was performed including scalp, head, eyes, ears, nose, lips, neck, chest, axillae, abdomen, back, buttocks, bilateral upper extremities, bilateral lower extremities, hands, feet, fingers, toes, fingernails, and toenails. All findings within normal limits unless otherwise noted below.  Pubic Purple papules  b/l feet Scaling and maceration web spaces and over distal and lateral soles.   labia majora White subcutaneous nodules    Assessment & Plan  Angiokeratoma Pubic  Benign-appearing.  Observation.  Call clinic for new or changing lesions.   Tinea pedis of both feet b/l feet  Patient currently using Lamisil spray, may continue as needed.  Vaginal cyst labia majora  Benign-appearing. Exam most consistent with cysts. Discussed that a cyst is a benign growth that can grow over time and sometimes get irritated or inflamed. Recommend observation if it is not bothersome. Discussed option of surgical excision to remove it if it is growing, symptomatic, or other changes noted. Please call for new or changing lesions so they can be evaluated.     History of Dysplastic  Nevi - No evidence of recurrence today at left upper back - Recommend regular full body skin exams - Recommend daily broad spectrum sunscreen SPF 30+ to sun-exposed areas, reapply every 2 hours as needed.  - Call if any new or changing lesions are noted between office visits  Lentigines - Scattered tan macules - Due to sun exposure - Benign-appearing, observe - Recommend daily broad spectrum sunscreen SPF 30+ to sun-exposed areas, reapply every 2 hours as needed. - Call for any changes  Seborrheic Keratoses - Stuck-on, waxy, tan-brown papules and/or plaques  - Benign-appearing - Discussed benign etiology and prognosis. - Observe - Call for any changes  Melanocytic Nevi - Tan-brown and/or pink-flesh-colored symmetric macules and papules - Benign appearing on exam today - Observation - Call clinic for new or changing moles - Recommend daily use of broad spectrum spf 30+ sunscreen to sun-exposed areas.   Hemangiomas - Red papules - Discussed benign nature - Observe - Call for any changes  Actinic Damage - Chronic condition, secondary to cumulative UV/sun exposure - diffuse scaly erythematous macules with underlying dyspigmentation - Recommend daily broad spectrum sunscreen SPF 30+ to sun-exposed areas, reapply every 2 hours as needed.  - Staying in the shade or wearing long sleeves, sun glasses (UVA+UVB protection) and wide brim hats (4-inch brim around the entire circumference of the hat) are also recommended for sun protection.  - Call for new or changing lesions.  Skin cancer screening performed today.  Acrochordons (Skin Tags) - Fleshy, skin-colored pedunculated papules - Benign appearing.  - Observe. -  If desired, they can be removed with an in office procedure that is not covered by insurance. - Please call the clinic if you notice any new or changing lesions.  Return in about 1 year (around 12/31/2022) for TBSE, Hx Dysplastic Nevi.  Graciella Belton, RMA, am  acting as scribe for Forest Gleason, MD .  Documentation: I have reviewed the above documentation for accuracy and completeness, and I agree with the above.  Forest Gleason, MD

## 2021-12-30 NOTE — Patient Instructions (Signed)
Recommend taking Heliocare sun protection supplement daily in sunny weather for additional sun protection. For maximum protection on the sunniest days, you can take up to 2 capsules of regular Heliocare OR take 1 capsule of Heliocare Ultra. For prolonged exposure (such as a full day in the sun), you can repeat your dose of the supplement 4 hours after your first dose. Heliocare can be purchased at [facility], at some [facility] or at www.heliocare.com.    Melanoma ABCDEs  Melanoma is the most dangerous type of skin cancer, and is the leading cause of death from skin disease.  You are more likely to develop melanoma if you: Have light-colored skin, light-colored eyes, or red or blond hair Spend a lot of time in the sun Tan regularly, either outdoors or in a tanning bed Have had blistering sunburns, especially during childhood Have a close family member who has had a melanoma Have atypical moles or large birthmarks  Early detection of melanoma is key since treatment is typically straightforward and cure rates are extremely high if we catch it early.   The first sign of melanoma is often a change in a mole or a new dark spot.  The ABCDE system is a way of remembering the signs of melanoma.  A for asymmetry:  The two halves do not match. B for border:  The edges of the growth are irregular. C for color:  A mixture of colors are present instead of an even brown color. D for diameter:  Melanomas are usually (but not always) greater than 6mm - the size of a pencil eraser. E for evolution:  The spot keeps changing in size, shape, and color.  Please check your skin once per month between visits. You can use a small mirror in front and a large mirror behind you to keep an eye on the back side or your body.   If you see any new or changing lesions before your next follow-up, please call to schedule a visit.  Please continue daily skin protection including broad spectrum sunscreen SPF 30+ to  sun-exposed areas, reapplying every 2 hours as needed when you're outdoors.    Due to recent changes in healthcare laws, you may see results of your pathology and/or laboratory studies on MyChart before the doctors have had a chance to review them. We understand that in some cases there may be results that are confusing or concerning to you. Please understand that not all results are received at the same time and often the doctors may need to interpret multiple results in order to provide you with the best plan of care or course of treatment. Therefore, we ask that you please give us 2 business days to thoroughly review all your results before contacting the office for clarification. Should we see a critical lab result, you will be contacted sooner.   If You Need Anything After Your Visit  If you have any questions or concerns for your doctor, please call our main line at [phone] and press option 4 to reach your doctor's medical assistant. If no one answers, please leave a voicemail as directed and we will return your call as soon as possible. Messages left after 4 pm will be answered the following business day.   You may also send us a message via MyChart. We typically respond to MyChart messages within 1-2 business days.  For prescription refills, please ask your pharmacy to contact our office. Our fax number is [phone].  If you have   an urgent issue when the clinic is closed that cannot wait until the next business day, you can page your doctor at the number below.    Please note that while we do our best to be available for urgent issues outside of office hours, we are not available 24/7.   If you have an urgent issue and are unable to reach us, you may choose to seek medical care at your doctor's office, retail clinic, urgent care center, or emergency room.  If you have a medical emergency, please immediately call 911 or go to the emergency department.  Pager Numbers  - Dr.  Kowalski: [phone]  - Dr. Moye: [phone]  - Dr. Stewart: [phone]  In the event of inclement weather, please call our main line at [phone] for an update on the status of any delays or closures.  Dermatology Medication Tips: Please keep the boxes that topical medications come in in order to help keep track of the instructions about where and how to use these. Pharmacies typically print the medication instructions only on the boxes and not directly on the medication tubes.   If your medication is too expensive, please contact our office at [phone] option 4 or send us a message through MyChart.   We are unable to tell what your co-pay for medications will be in advance as this is different depending on your insurance coverage. However, we may be able to find a substitute medication at lower cost or fill out paperwork to get insurance to cover a needed medication.   If a prior authorization is required to get your medication covered by your insurance company, please allow us 1-2 business days to complete this process.  Drug prices often vary depending on where the prescription is filled and some pharmacies may offer cheaper prices.  The website www.goodrx.com contains coupons for medications through different pharmacies. The prices here do not account for what the cost may be with help from insurance (it may be cheaper with your insurance), but the website can give you the price if you did not use any insurance.  - You can print the associated coupon and take it with your prescription to the pharmacy.  - You may also stop by our office during regular business hours and pick up a GoodRx coupon card.  - If you need your prescription sent electronically to a different pharmacy, notify our office through [facility] MyChart or by phone at [phone] option 4.     Si Usted Necesita Algo Despus de Su Visita  Tambin puede enviarnos un mensaje a travs de MyChart. Por lo  general respondemos a los mensajes de MyChart en el transcurso de 1 a 2 das hbiles.  Para renovar recetas, por favor pida a su farmacia que se ponga en contacto con nuestra oficina. Nuestro nmero de fax es el [phone].  Si tiene un asunto urgente cuando la clnica est cerrada y que no puede esperar hasta el siguiente da hbil, puede llamar/localizar a su doctor(a) al nmero que aparece a continuacin.   Por favor, tenga en cuenta que aunque hacemos todo lo posible para estar disponibles para asuntos urgentes fuera del horario de oficina, no estamos disponibles las 24 horas del da, los 7 das de la semana.   Si tiene un problema urgente y no puede comunicarse con nosotros, puede optar por buscar atencin mdica  en el consultorio de su doctor(a), en una clnica privada, en un centro de atencin urgente o en una sala de   emergencias.  Si tiene una emergencia mdica, por favor llame inmediatamente al 911 o vaya a la sala de emergencias.  Nmeros de bper  - Dr. Kowalski: [phone]  - Dra. Moye: [phone]  - Dra. Stewart: [phone]  En caso de inclemencias del tiempo, por favor llame a nuestra lnea principal al [phone] para una actualizacin sobre el estado de cualquier retraso o cierre.  Consejos para la medicacin en dermatologa: Por favor, guarde las cajas en las que vienen los medicamentos de uso tpico para ayudarle a seguir las instrucciones sobre dnde y cmo usarlos. Las farmacias generalmente imprimen las instrucciones del medicamento slo en las cajas y no directamente en los tubos del medicamento.   Si su medicamento es muy caro, por favor, pngase en contacto con nuestra oficina llamando al [phone] y presione la opcin 4 o envenos un mensaje a travs de MyChart.   No podemos decirle cul ser su copago por los medicamentos por adelantado ya que esto es diferente dependiendo de la cobertura de su seguro. Sin embargo, es posible que podamos encontrar un  medicamento sustituto a menor costo o llenar un formulario para que el seguro cubra el medicamento que se considera necesario.   Si se requiere una autorizacin previa para que su compaa de seguros cubra su medicamento, por favor permtanos de 1 a 2 das hbiles para completar este proceso.  Los precios de los medicamentos varan con frecuencia dependiendo del lugar de dnde se surte la receta y alguna farmacias pueden ofrecer precios ms baratos.  El sitio web www.goodrx.com tiene cupones para medicamentos de diferentes farmacias. Los precios aqu no tienen en cuenta lo que podra costar con la ayuda del seguro (puede ser ms barato con su seguro), pero el sitio web puede darle el precio si no utiliz ningn seguro.  - Puede imprimir el cupn correspondiente y llevarlo con su receta a la farmacia.  - Tambin puede pasar por nuestra oficina durante el horario de atencin regular y recoger una tarjeta de cupones de GoodRx.  - Si necesita que su receta se enve electrnicamente a una farmacia diferente, informe a nuestra oficina a travs de MyChart de [facility] o por telfono llamando al [phone] y presione la opcin 4.  

## 2021-12-31 ENCOUNTER — Other Ambulatory Visit (INDEPENDENT_AMBULATORY_CARE_PROVIDER_SITE_OTHER): Payer: BC Managed Care – PPO

## 2021-12-31 DIAGNOSIS — R7989 Other specified abnormal findings of blood chemistry: Secondary | ICD-10-CM | POA: Diagnosis not present

## 2021-12-31 LAB — T4, FREE: Free T4: 0.84 ng/dL (ref 0.60–1.60)

## 2021-12-31 LAB — TSH: TSH: 4.06 u[IU]/mL (ref 0.35–5.50)

## 2022-01-03 ENCOUNTER — Encounter: Payer: Self-pay | Admitting: Internal Medicine

## 2022-01-03 ENCOUNTER — Encounter: Payer: Self-pay | Admitting: Dermatology

## 2022-01-03 ENCOUNTER — Ambulatory Visit (INDEPENDENT_AMBULATORY_CARE_PROVIDER_SITE_OTHER): Payer: BC Managed Care – PPO | Admitting: Internal Medicine

## 2022-01-03 VITALS — BP 140/88 | HR 73 | Temp 98.0°F | Resp 17 | Ht 65.0 in | Wt 242.2 lb

## 2022-01-03 DIAGNOSIS — R0602 Shortness of breath: Secondary | ICD-10-CM

## 2022-01-03 DIAGNOSIS — E538 Deficiency of other specified B group vitamins: Secondary | ICD-10-CM

## 2022-01-03 DIAGNOSIS — I10 Essential (primary) hypertension: Secondary | ICD-10-CM

## 2022-01-03 DIAGNOSIS — E1165 Type 2 diabetes mellitus with hyperglycemia: Secondary | ICD-10-CM

## 2022-01-03 DIAGNOSIS — E782 Mixed hyperlipidemia: Secondary | ICD-10-CM

## 2022-01-03 DIAGNOSIS — R7989 Other specified abnormal findings of blood chemistry: Secondary | ICD-10-CM

## 2022-01-03 DIAGNOSIS — R197 Diarrhea, unspecified: Secondary | ICD-10-CM

## 2022-01-03 DIAGNOSIS — R945 Abnormal results of liver function studies: Secondary | ICD-10-CM

## 2022-01-03 DIAGNOSIS — I480 Paroxysmal atrial fibrillation: Secondary | ICD-10-CM

## 2022-01-03 DIAGNOSIS — E559 Vitamin D deficiency, unspecified: Secondary | ICD-10-CM

## 2022-01-03 NOTE — Progress Notes (Signed)
Patient ID: JEANEEN CALA, female   DOB: [birth year], 56 y.o.   MRN: [id number]   Subjective:    Patient ID: [name], female    DOB: 01-30-1966, 56 y.o.   MRN: [id number]   Patient here for  Chief Complaint  Patient presents with   Follow-up   .   HPI Follow up regarding her blood pressure, blood sugar and afib.  Participating in medical nutrition visits - [facility].  Had recommended digestive enzymes or bismuth. Not taking.  Seeing Dr Jimmye Norman - prescribed ivermectin and naltrexone as outlined previous visit.  Off ivermectin currently.  Feels bowels have done better with this treatment regimen.  Eating.  No vomiting.  She has noticed recently when walking, some increased heart rate and sob with exertion.  Some increased - describes "nothing like what experienced previously", but has noticed.     Past Medical History:  Diagnosis Date   Anemia    Angio-edema    Atrial fibrillation ([place])    Complication of anesthesia    Diabetes mellitus without complication ([place])    diet controlled   Dysplastic nevus 06/27/2018   Left upper back. Moderate atypia, limited margins free.    Eczema    Epstein Barr infection    Family history of anesthesia complication    Mother - confusion   Fatty liver    Heart murmur    Slight - "nothing to worry about"   Hemorrhoid    Hypertension    Hypothyroidism    PONV (postoperative nausea and vomiting)    "only with c-sections"   Sleep apnea    mild   Past Surgical History:  Procedure Laterality Date   CESAREAN SECTION     CHOLECYSTECTOMY N/A 09/07/2018   Procedure: LAPAROSCOPIC CHOLECYSTECTOMY;  Surgeon: Herbert Pun, MD;  Location: [facility] ORS;  Service: General;  Laterality: N/A;   FINGER SURGERY     pin to 3rd finger Right hand   LUMBAR LAMINECTOMY/DECOMPRESSION MICRODISCECTOMY  12/15/2011   Procedure: LUMBAR LAMINECTOMY/DECOMPRESSION MICRODISCECTOMY 1 LEVEL;  Surgeon: Ophelia Charter, MD;  Location: [facility];  Service: Neurosurgery;  Laterality: Right;  RIGHT Lumbar four-Five diskectomy   LUMBAR LAMINECTOMY/DECOMPRESSION MICRODISCECTOMY N/A 11/04/2019   Procedure: MICRODISCECTOMY LEFT LUMBAR FIVE SACRAL ONE;  Surgeon: Newman Pies, MD;  Location: [facility];  Service: Neurosurgery;  Laterality: N/A;  3C   MICRODISCECTOMY LUMBAR     L4-5   WISDOM TOOTH EXTRACTION     Family History  Problem Relation Age of Onset   GER disease Mother    Diabetes Father    Cancer Father        Melanoma, Prostate   Heart disease Father    Heart disease Maternal Grandfather    Diabetes Paternal Grandfather    Breast cancer Sister 52   Social History   Socioeconomic History   Marital status: Married    Spouse name: Not on file   Number of children: 2   Years of education: Not on file   Highest education level: Not on file  Occupational History    Employer: [employer]  Tobacco Use   Smoking status: Never   Smokeless tobacco: Never  Vaping Use   Vaping Use: Never used  Substance and Sexual Activity   Alcohol use: Not Currently    Alcohol/week: 0.0 standard drinks of alcohol   Drug use: No   Sexual activity: Not on file  Other Topics Concern   Not on file  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  HENT:  Negative for congestion and sinus pressure.   Respiratory:  Negative for cough and chest tightness.        Some sob as outlined.   Cardiovascular:  Negative for chest pain.       No increased swelling.  Increased heart rate (intermittent) as outlined.   Gastrointestinal:  Negative for abdominal pain, nausea and vomiting.  Genitourinary:  Negative for  difficulty urinating and dysuria.  Musculoskeletal:  Negative for myalgias.       Reported joints were doing better on medication regimen - prescribed by Dr Jimmye Norman.   Skin:  Negative for color change and rash.  Neurological:  Negative for dizziness and headaches.  Psychiatric/Behavioral:  Negative for agitation and dysphoric mood.        Objective:     BP (!) 140/88 (BP Location: Left Arm, Patient Position: Sitting, Cuff Size: Large)   Pulse 73   Temp 98 F (36.7 C) (Temporal)   Resp 17   Ht _0  (1.651 m)   Wt 242 lb 3.2 oz (109.9 kg)   SpO2 98%   BMI 40.30 kg/m  Wt Readings from Last 3 Encounters:  01/03/22 242 lb 3.2 oz (109.9 kg)  11/15/21 238 lb 3.2 oz (108 kg)  09/27/21 239 lb (108.4 kg)    Physical Exam Vitals reviewed.  Constitutional:      General: She is not in acute distress.    Appearance: Normal appearance.  HENT:     Head: Normocephalic and atraumatic.     Right Ear: External ear normal.     Left Ear: External ear normal.  Eyes:     General: No scleral icterus.       Right eye: No discharge.        Left eye: No discharge.     Conjunctiva/sclera: Conjunctivae normal.  Neck:     Thyroid: No thyromegaly.  Cardiovascular:     Rate and Rhythm: Normal rate and regular rhythm.  Pulmonary:     Effort: No respiratory distress.     Breath sounds: Normal breath sounds. No wheezing.  Abdominal:     General: Bowel sounds are normal.     Palpations: Abdomen is soft.     Tenderness: There is no abdominal tenderness.  Musculoskeletal:        General: No swelling or tenderness.     Cervical back: Neck supple. No tenderness.  Lymphadenopathy:     Cervical: No cervical adenopathy.  Skin:    Findings: No erythema or rash.  Neurological:     Mental Status: She is alert.  Psychiatric:        Mood and Affect: Mood normal.        Behavior: Behavior normal.      Outpatient Encounter Medications as of 01/03/2022  Medication Sig   EPINEPHrine (EPIPEN  2-PAK) 0.3 mg/0.3 mL IJ SOAJ injection Inject 0.3 mg into the muscle as needed for anaphylaxis.   ivermectin (STROMECTOL) 3 MG TABS tablet Take by mouth once.   ketoconazole (NIZORAL) 2 % cream Apply to areas of rash twice daily as needed.   metoprolol  succinate (TOPROL-XL) 25 MG 24 hr tablet Take 12.5 mg by mouth daily.   NALTREXONE HCL PO Take 2 mg by mouth daily.   Syringe/Needle, Disp, (SYRINGE 3CC/25GX1") 25G X 1" 3 ML MISC Use as directed with b12 injections.   VITAMIN D PO Take by mouth.   [DISCONTINUED] hydrALAZINE (APRESOLINE) 10 MG tablet Take one daily - midday   [DISCONTINUED] hydrALAZINE (APRESOLINE) 25 MG tablet Take 1 tablet (25 mg total) by mouth in the morning and at bedtime.   No facility-administered encounter medications on file as of 01/03/2022.     Lab Results  Component Value Date   WBC 9.1 07/16/2021   HGB 15.2 07/16/2021   HCT 46 07/16/2021   PLT 238 07/16/2021   GLUCOSE 87 09/27/2021   CHOL 159 05/07/2021   TRIG 92.0 05/07/2021   HDL 60.60 05/07/2021   LDLDIRECT 145.4 03/01/2013   LDLCALC 80 05/07/2021   ALT 30 07/16/2021   AST 30 07/16/2021   NA 139 09/27/2021   K 4.3 09/27/2021   CL 103 09/27/2021   CREATININE 0.74 09/27/2021   BUN 13 09/27/2021   CO2 24 09/27/2021   TSH 4.06 12/31/2021   HGBA1C 5.6 09/27/2021   MICROALBUR 1.6 01/13/2021       Assessment & Plan:   Problem List Items Addressed This Visit     Abnormal liver function    Diet and exercise. Follow liver panel.       Relevant Orders   Hepatic function panel   Abnormal TSH    Recent tsh and free T4 wnl.  Follow.       Relevant Orders   TSH   T4, free   B12 deficiency    Continue B12 injections.       Relevant Orders   Vitamin B12   Diarrhea    Last stool studies negative.  She has had persistent problems with diarrhea. (Ongoing for years). Symptoms seem to have been more noticeable after cholecystectomy 08/2018.  Work up to date has included - TTgIGA negative.  Lipase  wnl.  CT abd/pelvis - diverticulosis, otherwise negative. Colonoscopy negative for microscopic colitis.  TSH/T4 negative.  No weight loss.  Eating.  No nausea or vomiting.   Discussed history of loose stools.  Reports some improvement.  Declines cholestyramine.       Hyperlipidemia, mixed    Low cholesterol diet and exercise.  Follow lipid panel.       Relevant Orders   CBC with Differential/Platelet   Lipid panel   Hypertension    Blood pressure remains elevated. Currently metoprolol and hydralazine.  Not taking midday dose of hydralazine on a regular basis.  Wants to hold on making changes and focus on taking medication as directed. Follow pressures. Follow metabolic panel.       Relevant Orders   Basic metabolic panel   Paroxysmal A-fib ([place])    S/p ablation.  Off xarelto.  Afib/symptoms improved.  On 1/2 metoprolol. Follow pressures.  Breathing better.  Have discussed anticoagulation. She has declined.  Recently noticed some increased heart rate and sob with walking.  (Intermittent).  EKG today SR with no acute ischemic changes. Discussed f/u with cardiology. Wants to monitor.  Contiue current medication.       SOB (shortness of breath) - Primary    S/p ablation.  Off xarelto.  Afib/symptoms improved.  On 1/2 metoprolol. Follow pressures.  Breathing better.  Have discussed anticoagulation. She has declined.  Recently noticed some increased heart rate and  sob with walking.  (Intermittent).  EKG today SR with no acute ischemic changes. Discussed f/u with cardiology. Wants to monitor.  Contiue current medication.       Relevant Orders   EKG 12-Lead (Completed)   Type 2 diabetes mellitus with hyperglycemia ([facility])    Continue low carb diet and exercise.  Follow met b and a1c.   Lab Results  Component Value Date   HGBA1C 5.6 09/27/2021       Relevant Orders   Hemoglobin A1c   Vitamin D deficiency    Vitamin D level wnl 06/2021.         Einar Pheasant, MD

## 2022-01-04 ENCOUNTER — Other Ambulatory Visit: Payer: Self-pay | Admitting: Internal Medicine

## 2022-01-04 ENCOUNTER — Encounter: Payer: Self-pay | Admitting: Internal Medicine

## 2022-01-16 ENCOUNTER — Encounter: Payer: Self-pay | Admitting: Internal Medicine

## 2022-01-16 NOTE — Assessment & Plan Note (Signed)
Low cholesterol diet and exercise.  Follow lipid panel.   

## 2022-01-16 NOTE — Assessment & Plan Note (Signed)
Continue low carb diet and exercise.  Follow met b and a1c.   Lab Results  Component Value Date   HGBA1C 5.6 09/27/2021

## 2022-01-16 NOTE — Assessment & Plan Note (Signed)
Blood pressure remains elevated. Currently metoprolol and hydralazine.  Not taking midday dose of hydralazine on a regular basis.  Wants to hold on making changes and focus on taking medication as directed. Follow pressures. Follow metabolic panel.

## 2022-01-16 NOTE — Assessment & Plan Note (Signed)
S/p ablation.  Off xarelto.  Afib/symptoms improved.  On 1/2 metoprolol. Follow pressures.  Breathing better.  Have discussed anticoagulation. She has declined.  Recently noticed some increased heart rate and sob with walking.  (Intermittent).  EKG today SR with no acute ischemic changes. Discussed f/u with cardiology. Wants to monitor.  Contiue current medication.

## 2022-01-16 NOTE — Assessment & Plan Note (Signed)
Last stool studies negative.  She has had persistent problems with diarrhea. (Ongoing for years). Symptoms seem to have been more noticeable after cholecystectomy 08/2018.  Work up to date has included - TTgIGA negative.  Lipase wnl.  CT abd/pelvis - diverticulosis, otherwise negative. Colonoscopy negative for microscopic colitis.  TSH/T4 negative.  No weight loss.  Eating.  No nausea or vomiting.   Discussed history of loose stools.  Reports some improvement.  Declines cholestyramine.

## 2022-01-16 NOTE — Assessment & Plan Note (Signed)
Continue B12 injections.   

## 2022-01-16 NOTE — Assessment & Plan Note (Signed)
Diet and exercise.  Follow liver panel.   

## 2022-01-16 NOTE — Assessment & Plan Note (Signed)
Vitamin D level wnl 06/2021.

## 2022-01-16 NOTE — Assessment & Plan Note (Signed)
Recent tsh and free T4 wnl.  Follow.

## 2022-01-21 DIAGNOSIS — E119 Type 2 diabetes mellitus without complications: Secondary | ICD-10-CM | POA: Diagnosis not present

## 2022-01-24 DIAGNOSIS — I480 Paroxysmal atrial fibrillation: Secondary | ICD-10-CM | POA: Diagnosis not present

## 2022-01-24 DIAGNOSIS — I10 Essential (primary) hypertension: Secondary | ICD-10-CM | POA: Diagnosis not present

## 2022-01-24 DIAGNOSIS — G4733 Obstructive sleep apnea (adult) (pediatric): Secondary | ICD-10-CM | POA: Diagnosis not present

## 2022-01-24 DIAGNOSIS — R002 Palpitations: Secondary | ICD-10-CM | POA: Diagnosis not present

## 2022-02-21 DIAGNOSIS — E119 Type 2 diabetes mellitus without complications: Secondary | ICD-10-CM | POA: Diagnosis not present

## 2022-02-22 ENCOUNTER — Encounter: Payer: Self-pay | Admitting: Internal Medicine

## 2022-02-22 ENCOUNTER — Other Ambulatory Visit (INDEPENDENT_AMBULATORY_CARE_PROVIDER_SITE_OTHER): Payer: BC Managed Care – PPO

## 2022-02-22 DIAGNOSIS — I10 Essential (primary) hypertension: Secondary | ICD-10-CM

## 2022-02-22 DIAGNOSIS — R7989 Other specified abnormal findings of blood chemistry: Secondary | ICD-10-CM | POA: Diagnosis not present

## 2022-02-22 DIAGNOSIS — R945 Abnormal results of liver function studies: Secondary | ICD-10-CM | POA: Diagnosis not present

## 2022-02-22 DIAGNOSIS — E1165 Type 2 diabetes mellitus with hyperglycemia: Secondary | ICD-10-CM

## 2022-02-22 DIAGNOSIS — E782 Mixed hyperlipidemia: Secondary | ICD-10-CM

## 2022-02-22 DIAGNOSIS — E538 Deficiency of other specified B group vitamins: Secondary | ICD-10-CM

## 2022-02-22 LAB — CBC WITH DIFFERENTIAL/PLATELET
Basophils Absolute: 0 10*3/uL (ref 0.0–0.1)
Basophils Relative: 0.6 % (ref 0.0–3.0)
Eosinophils Absolute: 0.1 10*3/uL (ref 0.0–0.7)
Eosinophils Relative: 1.2 % (ref 0.0–5.0)
HCT: 42.4 % (ref 36.0–46.0)
Hemoglobin: 14.5 g/dL (ref 12.0–15.0)
Lymphocytes Relative: 27.2 % (ref 12.0–46.0)
Lymphs Abs: 2 10*3/uL (ref 0.7–4.0)
MCHC: 34.1 g/dL (ref 30.0–36.0)
MCV: 94.6 fl (ref 78.0–100.0)
Monocytes Absolute: 0.5 10*3/uL (ref 0.1–1.0)
Monocytes Relative: 6.9 % (ref 3.0–12.0)
Neutro Abs: 4.7 10*3/uL (ref 1.4–7.7)
Neutrophils Relative %: 64.1 % (ref 43.0–77.0)
Platelets: 206 10*3/uL (ref 150.0–400.0)
RBC: 4.49 Mil/uL (ref 3.87–5.11)
RDW: 13.8 % (ref 11.5–15.5)
WBC: 7.3 10*3/uL (ref 4.0–10.5)

## 2022-02-22 LAB — BASIC METABOLIC PANEL
BUN: 11 mg/dL (ref 6–23)
CO2: 24 mEq/L (ref 19–32)
Calcium: 9.2 mg/dL (ref 8.4–10.5)
Chloride: 106 mEq/L (ref 96–112)
Creatinine, Ser: 0.63 mg/dL (ref 0.40–1.20)
GFR: 100.49 mL/min (ref >60.00)
Glucose, Bld: 114 mg/dL — ABNORMAL HIGH (ref 70–99)
Potassium: 4.2 mEq/L (ref 3.5–5.1)
Sodium: 140 mEq/L (ref 135–145)

## 2022-02-22 LAB — LIPID PANEL
Cholesterol: 199 mg/dL (ref 0–200)
HDL: 65.3 mg/dL (ref >39.00)
LDL Cholesterol: 116 mg/dL — ABNORMAL HIGH (ref 0–99)
NonHDL: 133.2
Total CHOL/HDL Ratio: 3
Triglycerides: 88 mg/dL (ref 0.0–149.0)
VLDL: 17.6 mg/dL (ref 0.0–40.0)

## 2022-02-22 LAB — HEPATIC FUNCTION PANEL
ALT: 34 U/L (ref 0–35)
AST: 23 U/L (ref 0–37)
Albumin: 4.1 g/dL (ref 3.5–5.2)
Alkaline Phosphatase: 73 U/L (ref 39–117)
Bilirubin, Direct: 0.1 mg/dL (ref 0.0–0.3)
Total Bilirubin: 0.6 mg/dL (ref 0.2–1.2)
Total Protein: 6.7 g/dL (ref 6.0–8.3)

## 2022-02-22 LAB — VITAMIN B12: Vitamin B-12: 976 pg/mL — ABNORMAL HIGH (ref 211–911)

## 2022-02-22 LAB — T4, FREE: Free T4: 0.73 ng/dL (ref 0.60–1.60)

## 2022-02-22 LAB — HEMOGLOBIN A1C: Hgb A1c MFr Bld: 5.8 % (ref 4.6–6.5)

## 2022-02-22 LAB — TSH: TSH: 4.88 u[IU]/mL (ref 0.35–5.50)

## 2022-02-23 NOTE — Telephone Encounter (Signed)
Ok to add vitamin D to lab

## 2022-02-24 ENCOUNTER — Encounter: Payer: Self-pay | Admitting: Internal Medicine

## 2022-02-24 ENCOUNTER — Ambulatory Visit: Payer: BC Managed Care – PPO | Admitting: Internal Medicine

## 2022-02-24 ENCOUNTER — Other Ambulatory Visit: Payer: Self-pay

## 2022-02-24 ENCOUNTER — Other Ambulatory Visit (INDEPENDENT_AMBULATORY_CARE_PROVIDER_SITE_OTHER): Payer: BC Managed Care – PPO

## 2022-02-24 VITALS — BP 160/102 | HR 73 | Temp 97.9°F | Ht 65.0 in | Wt 245.6 lb

## 2022-02-24 DIAGNOSIS — E559 Vitamin D deficiency, unspecified: Secondary | ICD-10-CM

## 2022-02-24 DIAGNOSIS — E1165 Type 2 diabetes mellitus with hyperglycemia: Secondary | ICD-10-CM | POA: Diagnosis not present

## 2022-02-24 DIAGNOSIS — I480 Paroxysmal atrial fibrillation: Secondary | ICD-10-CM

## 2022-02-24 DIAGNOSIS — R197 Diarrhea, unspecified: Secondary | ICD-10-CM | POA: Diagnosis not present

## 2022-02-24 DIAGNOSIS — R945 Abnormal results of liver function studies: Secondary | ICD-10-CM

## 2022-02-24 DIAGNOSIS — I10 Essential (primary) hypertension: Secondary | ICD-10-CM

## 2022-02-24 DIAGNOSIS — K760 Fatty (change of) liver, not elsewhere classified: Secondary | ICD-10-CM

## 2022-02-24 DIAGNOSIS — E782 Mixed hyperlipidemia: Secondary | ICD-10-CM

## 2022-02-24 LAB — MICROALBUMIN / CREATININE URINE RATIO
Creatinine,U: 119.4 mg/dL
Microalb Creat Ratio: 1.4 mg/g (ref 0.0–30.0)
Microalb, Ur: 1.7 mg/dL (ref 0.0–1.9)

## 2022-02-24 LAB — VITAMIN D 25 HYDROXY (VIT D DEFICIENCY, FRACTURES): VITD: 27.67 ng/mL — ABNORMAL LOW (ref 30.00–100.00)

## 2022-02-24 MED ORDER — EPINEPHRINE 0.3 MG/0.3ML IJ SOAJ: 0.3000 mg | INTRAMUSCULAR | 0 refills | Status: DC | PRN

## 2022-02-24 MED ORDER — SPIRONOLACTONE 25 MG PO TABS: 12.5000 mg | ORAL_TABLET | Freq: Every day | ORAL | 1 refills | Status: DC

## 2022-02-24 MED ORDER — HYDRALAZINE HCL 25 MG PO TABS: 25.0000 mg | ORAL_TABLET | Freq: Three times a day (TID) | ORAL | 2 refills | Status: DC

## 2022-02-24 NOTE — Progress Notes (Signed)
Patient ID: Joy Houston, female   DOB: Feb 03, 1966, 57 y.o.   MRN: [id number]   Subjective:    Patient ID: Joy Houston, female    DOB: Dec 19, 1965, 57 y.o.   MRN: [id number]   Patient here for  Chief Complaint  Patient presents with   Medical Management of Chronic Issues    Follow up    .   HPI Follow up regarding her blood pressure, blood sugar and afib. Seeing Dr Jimmye Norman - prescribed ivermectin and naltrexone as outlined previous visit.  Reports seeing him regularly.  Recently saw him (several weeks ago) - diagnosed with flu.  Symptoms persisted - f/u appt with him - zpak and prednisone prescribed.  Had f/u today.  Has not been watching her diet as well.  Not exercising regularly. Last visit - noticed recently when walking, some increased heart rate and sob with exertion. Saw Dr Nehemiah Massed. No changes made.  Dr Nehemiah Massed has moved.  Request cardiologist at [facility].  Planning to travel to [facility] Saturday.  Blood pressure is elevated.  Headache - intermittent.  No dizziness reported.  No chest pain.  Breathing overall stable. No increased cough or congestion. Still with loose stool.     Past Medical History:  Diagnosis Date   Anemia    Angio-edema    Atrial fibrillation ([place])    Complication of anesthesia    Diabetes mellitus without complication ([place])    diet controlled   Dysplastic nevus 06/27/2018   Left upper back. Moderate atypia, limited margins free.    Eczema    Epstein Barr infection    Family history of anesthesia complication    Mother - confusion   Fatty liver    Heart murmur    Slight - "nothing to worry about"   Hemorrhoid    Hypertension    Hypothyroidism    PONV (postoperative nausea and vomiting)    "only with c-sections"   Sleep apnea    mild   Past Surgical History:  Procedure Laterality Date   CESAREAN SECTION     CHOLECYSTECTOMY N/A 09/07/2018   Procedure: LAPAROSCOPIC CHOLECYSTECTOMY;  Surgeon: Herbert Pun, MD;  Location: [facility] ORS;   Service: General;  Laterality: N/A;   FINGER SURGERY     pin to 3rd finger Right hand   LUMBAR LAMINECTOMY/DECOMPRESSION MICRODISCECTOMY  12/15/2011   Procedure: LUMBAR LAMINECTOMY/DECOMPRESSION MICRODISCECTOMY 1 LEVEL;  Surgeon: Ophelia Charter, MD;  Location: [place] [facility];  Service: Neurosurgery;  Laterality: Right;  RIGHT Lumbar four-Five diskectomy   LUMBAR LAMINECTOMY/DECOMPRESSION MICRODISCECTOMY N/A 11/04/2019   Procedure: MICRODISCECTOMY LEFT LUMBAR FIVE SACRAL ONE;  Surgeon: Newman Pies, MD;  Location: [place];  Service: Neurosurgery;  Laterality: N/A;  3C   MICRODISCECTOMY LUMBAR     L4-5   WISDOM TOOTH EXTRACTION     Family History  Problem Relation Age of Onset   GER disease Mother    Diabetes Father    Cancer Father        Melanoma, Prostate   Heart disease Father    Heart disease Maternal Grandfather    Diabetes Paternal Grandfather    Breast cancer Sister 12   Social History   Socioeconomic History   Marital status: Married    Spouse name: Not on file   Number of children: 2   Years of education: Not on file   Highest education level: Not on file  Occupational History    Employer: [employer]  Tobacco Use   Smoking status: Never  Smokeless tobacco: Never  Vaping Use   Vaping Use: Never used  Substance and Sexual Activity   Alcohol use: Not Currently    Alcohol/week: 0.0 standard drinks of alcohol   Drug use: No   Sexual activity: Not on file  Other Topics Concern   Not on file  Social History Narrative   Nurse    Married   Social Determinants of Health   Financial Resource Strain: Not on file  Food Insecurity: Not on file  Transportation Needs: Not on file  Physical Activity: Not on file  Stress: Not on file  Social Connections: Not on file     Review of Systems  Constitutional:  Negative for appetite change and unexpected weight change.  HENT:  Negative for congestion and sinus pressure.   Respiratory:  Negative for cough and  chest tightness.        Breathing overall stable.   Cardiovascular:  Negative for chest pain.       No increased swelling.    Gastrointestinal:  Negative for abdominal pain, nausea and vomiting.       Persistent loose stools as outlined.   Genitourinary:  Negative for difficulty urinating and dysuria.  Musculoskeletal:  Negative for joint swelling and myalgias.  Skin:  Negative for color change and rash.  Neurological:  Positive for headaches. Negative for dizziness.  Psychiatric/Behavioral:  Negative for agitation and dysphoric mood.        Objective:     BP (!) 160/102   Pulse 73   Temp 97.9 F (36.6 C) (Oral)   Ht '5\' 5"'$  (1.651 m)   Wt 245 lb 9.6 oz (111.4 kg)   SpO2 98%   BMI 40.87 kg/m  Wt Readings from Last 3 Encounters:  02/24/22 245 lb 9.6 oz (111.4 kg)  01/03/22 242 lb 3.2 oz (109.9 kg)  11/15/21 238 lb 3.2 oz (108 kg)    Physical Exam Vitals reviewed.  Constitutional:      General: She is not in acute distress.    Appearance: Normal appearance.  HENT:     Head: Normocephalic and atraumatic.     Right Ear: External ear normal.     Left Ear: External ear normal.  Eyes:     General: No scleral icterus.       Right eye: No discharge.        Left eye: No discharge.     Conjunctiva/sclera: Conjunctivae normal.  Neck:     Thyroid: No thyromegaly.  Cardiovascular:     Rate and Rhythm: Normal rate and regular rhythm.  Pulmonary:     Effort: No respiratory distress.     Breath sounds: Normal breath sounds. No wheezing.  Abdominal:     General: Bowel sounds are normal.     Palpations: Abdomen is soft.     Tenderness: There is no abdominal tenderness.  Musculoskeletal:        General: No swelling or tenderness.     Cervical back: Neck supple. No tenderness.  Lymphadenopathy:     Cervical: No cervical adenopathy.  Skin:    Findings: No erythema or rash.  Neurological:     Mental Status: She is alert.  Psychiatric:        Mood and Affect: Mood normal.         Behavior: Behavior normal.      Outpatient Encounter Medications as of 02/24/2022  Medication Sig   hydrALAZINE (APRESOLINE) 25 MG tablet Take 1 tablet (25 mg total) by mouth 3 (  three) times daily.   ivermectin (STROMECTOL) 3 MG TABS tablet Take by mouth once.   ketoconazole (NIZORAL) 2 % cream Apply to areas of rash twice daily as needed.   metoprolol succinate (TOPROL-XL) 25 MG 24 hr tablet Take 12.5 mg by mouth daily.   NALTREXONE HCL PO Take 2 mg by mouth daily.   spironolactone (ALDACTONE) 25 MG tablet Take 0.5 tablets (12.5 mg total) by mouth daily.   Syringe/Needle, Disp, (SYRINGE 3CC/25GX1") 25G X 1" 3 ML MISC Use as directed with b12 injections.   VITAMIN D PO Take by mouth.   [DISCONTINUED] EPINEPHrine (EPIPEN 2-PAK) 0.3 mg/0.3 mL IJ SOAJ injection Inject 0.3 mg into the muscle as needed for anaphylaxis.   [DISCONTINUED] hydrALAZINE (APRESOLINE) 10 MG tablet TAKE ONE TABLET BY MOUTH DAILY AT MIDDAY   [DISCONTINUED] hydrALAZINE (APRESOLINE) 25 MG tablet TAKE 1 TABLET (25 MG) BY MOUTH IN THE MORNING AND AT BEDTIME   EPINEPHrine (EPIPEN 2-PAK) 0.3 mg/0.3 mL IJ SOAJ injection Inject 0.3 mg into the muscle as needed for anaphylaxis.   No facility-administered encounter medications on file as of 02/24/2022.     Lab Results  Component Value Date   WBC 7.3 02/22/2022   HGB 14.5 02/22/2022   HCT 42.4 02/22/2022   PLT 206.0 02/22/2022   GLUCOSE 114 (H) 02/22/2022   CHOL 199 02/22/2022   TRIG 88.0 02/22/2022   HDL 65.30 02/22/2022   LDLDIRECT 145.4 03/01/2013   LDLCALC 116 (H) 02/22/2022   ALT 34 02/22/2022   AST 23 02/22/2022   NA 140 02/22/2022   K 4.2 02/22/2022   CL 106 02/22/2022   CREATININE 0.63 02/22/2022   BUN 11 02/22/2022   CO2 24 02/22/2022   TSH 4.88 02/22/2022   HGBA1C 5.8 02/22/2022   MICROALBUR 1.7 02/24/2022       Assessment & Plan:   Problem List Items Addressed This Visit     Abnormal liver function    Diet and exercise. Follow liver panel.  Recent check wnl.       Diarrhea    Last stool studies negative.  She has had persistent problems with diarrhea. (Ongoing for years). Symptoms seem to have been more noticeable after cholecystectomy 08/2018.  Work up to date has included - TTgIGA negative.  Lipase wnl.  CT abd/pelvis - diverticulosis, otherwise negative. Colonoscopy negative for microscopic colitis.  TSH/T4 negative.  No weight loss.  Eating.  No nausea or vomiting.   Discussed history of loose stools.  Declines cholestyramine.      Fatty liver    Previously reported on ultrasound 2020.  Recent liver panel wnl.       Hyperlipidemia, mixed    Low cholesterol diet and exercise.  Follow lipid panel.       Relevant Medications   EPINEPHrine (EPIPEN 2-PAK) 0.3 mg/0.3 mL IJ SOAJ injection   hydrALAZINE (APRESOLINE) 25 MG tablet   spironolactone (ALDACTONE) 25 MG tablet   Hypertension    Blood pressure remains elevated. Currently metoprolol and hydralazine.  Not taking midday dose of hydralazine on a regular basis.  Blood pressure elevated today.  Headache.  Discussed further treatment and recommendations.  Discussed treatment options.  Allegic to ACE inhibitor.  Was on hctz when had angioedema.  Intolerance to amlodipine.  Discussed increasing and titrating hydralazine.  Discussed spironolactone.  Discussed changing metoprolol to carvedilol.  She is concerned about starting a new medication given that [place] is [place] the [place].  Agreed to increased hydralazine to '25mg'$   tid.  Discussed titration.  Also send in rx for spironolactone - in case blood pressure does note respond to increase dose of hydralazine.  (Had intolerance to increasing hydralazine dose previously).  Follow pressures. Follow metabolic panel. She discussed with her cardiologist as well.  Follow pressures.  Follow metabolic panel.       Relevant Medications   EPINEPHrine (EPIPEN 2-PAK) 0.3 mg/0.3 mL IJ SOAJ injection   hydrALAZINE (APRESOLINE) 25 MG tablet    spironolactone (ALDACTONE) 25 MG tablet   Paroxysmal A-fib (HCC)    S/p ablation.  Off xarelto.  Afib/symptoms improved.  On 1/2 metoprolol. Follow pressures.  Breathing better.  Have discussed anticoagulation. She has declined.  Recently noticed some increased heart rate and sob with walking.  (Intermittent).  Saw cardiology.  No changes made. Continue current medication.  Follow.       Relevant Medications   EPINEPHrine (EPIPEN 2-PAK) 0.3 mg/0.3 mL IJ SOAJ injection   hydrALAZINE (APRESOLINE) 25 MG tablet   spironolactone (ALDACTONE) 25 MG tablet   Type 2 diabetes mellitus with hyperglycemia (HCC) - Primary    Continue low carb diet and exercise.  Follow met b and a1c.   Lab Results  Component Value Date   HGBA1C 5.8 02/22/2022       Relevant Orders   Urine Microalbumin w/creat. ratio (Completed)   Vitamin D deficiency    Checked with recent labs.  Being treated - Dr Jimmye Norman.        Joy Pheasant, MD

## 2022-02-26 ENCOUNTER — Encounter: Payer: Self-pay | Admitting: Internal Medicine

## 2022-02-26 NOTE — Assessment & Plan Note (Signed)
Previously reported on ultrasound 2020.  Recent liver panel wnl.

## 2022-02-26 NOTE — Assessment & Plan Note (Signed)
Low cholesterol diet and exercise.  Follow lipid panel.   

## 2022-02-26 NOTE — Assessment & Plan Note (Signed)
Blood pressure remains elevated. Currently metoprolol and hydralazine.  Not taking midday dose of hydralazine on a regular basis.  Blood pressure elevated today.  Headache.  Discussed further treatment and recommendations.  Discussed treatment options.  Allegic to ACE inhibitor.  Was on hctz when had angioedema.  Intolerance to amlodipine.  Discussed increasing and titrating hydralazine.  Discussed spironolactone.  Discussed changing metoprolol to carvedilol.  She is concerned about starting a new medication given that [place] is [place] the [place].  Agreed to increased hydralazine to '25mg'$  tid.  Discussed titration.  Also send in rx for spironolactone - in case blood pressure does note respond to increase dose of hydralazine.  (Had intolerance to increasing hydralazine dose previously).  Follow pressures. Follow metabolic panel. She discussed with her cardiologist as well.  Follow pressures.  Follow metabolic panel.

## 2022-02-26 NOTE — Assessment & Plan Note (Signed)
Checked with recent labs.  Being treated - Dr Jimmye Norman.

## 2022-02-26 NOTE — Assessment & Plan Note (Signed)
Last stool studies negative.  She has had persistent problems with diarrhea. (Ongoing for years). Symptoms seem to have been more noticeable after cholecystectomy 08/2018.  Work up to date has included - TTgIGA negative.  Lipase wnl.  CT abd/pelvis - diverticulosis, otherwise negative. Colonoscopy negative for microscopic colitis.  TSH/T4 negative.  No weight loss.  Eating.  No nausea or vomiting.   Discussed history of loose stools.  Declines cholestyramine.

## 2022-02-26 NOTE — Assessment & Plan Note (Signed)
Continue low carb diet and exercise.  Follow met b and a1c.   Lab Results  Component Value Date   HGBA1C 5.8 02/22/2022   

## 2022-02-26 NOTE — Assessment & Plan Note (Signed)
S/p ablation.  Off xarelto.  Afib/symptoms improved.  On 1/2 metoprolol. Follow pressures.  Breathing better.  Have discussed anticoagulation. She has declined.  Recently noticed some increased heart rate and sob with walking.  (Intermittent).  Saw cardiology.  No changes made. Continue current medication.  Follow.  

## 2022-02-26 NOTE — Assessment & Plan Note (Signed)
Diet and exercise. Follow liver panel. Recent check wnl.  

## 2022-03-07 ENCOUNTER — Other Ambulatory Visit (HOSPITAL_COMMUNITY): Payer: Self-pay

## 2022-03-24 DIAGNOSIS — E119 Type 2 diabetes mellitus without complications: Secondary | ICD-10-CM | POA: Diagnosis not present

## 2022-03-29 ENCOUNTER — Encounter: Payer: Self-pay | Admitting: Internal Medicine

## 2022-03-29 ENCOUNTER — Ambulatory Visit: Payer: BC Managed Care – PPO | Admitting: Internal Medicine

## 2022-03-29 ENCOUNTER — Telehealth: Payer: Self-pay

## 2022-03-29 VITALS — BP 144/88 | HR 86 | Temp 98.1°F | Resp 16 | Ht 65.0 in | Wt 239.0 lb

## 2022-03-29 DIAGNOSIS — R195 Other fecal abnormalities: Secondary | ICD-10-CM

## 2022-03-29 DIAGNOSIS — I10 Essential (primary) hypertension: Secondary | ICD-10-CM

## 2022-03-29 DIAGNOSIS — R945 Abnormal results of liver function studies: Secondary | ICD-10-CM

## 2022-03-29 DIAGNOSIS — E782 Mixed hyperlipidemia: Secondary | ICD-10-CM

## 2022-03-29 DIAGNOSIS — E1165 Type 2 diabetes mellitus with hyperglycemia: Secondary | ICD-10-CM

## 2022-03-29 DIAGNOSIS — I480 Paroxysmal atrial fibrillation: Secondary | ICD-10-CM

## 2022-03-29 NOTE — Telephone Encounter (Signed)
At [facility], patient requested a very early appointment for her follow-up.  Patient has been scheduled to see Dr. Einar Pheasant on 05/11/2022.

## 2022-03-29 NOTE — Telephone Encounter (Signed)
Noted  

## 2022-03-29 NOTE — Progress Notes (Signed)
Subjective:    Patient ID: Joy Houston, female    DOB: Apr 17, 1965, 57 y.o.   MRN: [id number]  Patient here for  Chief Complaint  Patient presents with   Medical Management of Chronic Issues    HPI Here to follow up regarding her blood pressure.  Has made adjustments in her diet - "[employer]".  Is walking.  Her recent trip - she walked a lot.  Stiffness is better.  Saw Dr Jimmye Norman yesterday.  Planning for see a functional pharmacist and seeing a dietician.  Had headache several days ago.  No headache since.  Discussed blood pressure.  Still higher than goal.  No chest pain reported.  Breathing stable.  Persistent GI issues.     Past Medical History:  Diagnosis Date   Anemia    Angio-edema    Atrial fibrillation ([place])    Complication of anesthesia    Diabetes mellitus without complication ([place])    diet controlled   Dysplastic nevus 06/27/2018   Left upper back. Moderate atypia, limited margins free.    Eczema    Epstein Barr infection    Family history of anesthesia complication    Mother - confusion   Fatty liver    Heart murmur    Slight - "nothing to worry about"   Hemorrhoid    Hypertension    Hypothyroidism    PONV (postoperative nausea and vomiting)    "only with c-sections"   Sleep apnea    mild   Past Surgical History:  Procedure Laterality Date   CESAREAN SECTION     CHOLECYSTECTOMY N/A 09/07/2018   Procedure: LAPAROSCOPIC CHOLECYSTECTOMY;  Surgeon: Herbert Pun, MD;  Location: [facility] ORS;  Service: General;  Laterality: N/A;   FINGER SURGERY     pin to 3rd finger Right hand   LUMBAR LAMINECTOMY/DECOMPRESSION MICRODISCECTOMY  12/15/2011   Procedure: LUMBAR LAMINECTOMY/DECOMPRESSION MICRODISCECTOMY 1 LEVEL;  Surgeon: Ophelia Charter, MD;  Location: [place] [facility];  Service: Neurosurgery;  Laterality: Right;  RIGHT Lumbar four-Five diskectomy   LUMBAR LAMINECTOMY/DECOMPRESSION MICRODISCECTOMY N/A 11/04/2019   Procedure: MICRODISCECTOMY LEFT LUMBAR  FIVE SACRAL ONE;  Surgeon: Newman Pies, MD;  Location: [facility];  Service: Neurosurgery;  Laterality: N/A;  3C   MICRODISCECTOMY LUMBAR     L4-5   WISDOM TOOTH EXTRACTION     Family History  Problem Relation Age of Onset   GER disease Mother    Diabetes Father    Cancer Father        Melanoma, Prostate   Heart disease Father    Heart disease Maternal Grandfather    Diabetes Paternal Grandfather    Breast cancer Sister 25   Social History   Socioeconomic History   Marital status: Married    Spouse name: Not on file   Number of children: 2   Years of education: Not on file   Highest education level: Not on file  Occupational History    Employer: [employer]  Tobacco Use   Smoking status: Never   Smokeless tobacco: Never  Vaping Use   Vaping Use: Never used  Substance and Sexual Activity   Alcohol use: Not Currently    Alcohol/week: 0.0 standard drinks of alcohol   Drug use: No   Sexual activity: Not on file  Other Topics Concern   Not on file  Social History Narrative   Nurse    Married   Social Determinants of Health   Financial Resource Strain: Not on file  Food Insecurity:  Not on file  Transportation Needs: Not on file  Physical Activity: Not on file  Stress: Not on file  Social Connections: Not on file     Review of Systems  Constitutional:  Negative for appetite change and unexpected weight change.  HENT:  Negative for congestion and sinus pressure.   Respiratory:  Negative for cough, chest tightness and shortness of breath.   Cardiovascular:  Negative for chest pain and palpitations.       No increased swelling.   Gastrointestinal:  Negative for abdominal pain, diarrhea, nausea and vomiting.  Genitourinary:  Negative for difficulty urinating and dysuria.  Musculoskeletal:  Negative for myalgias.       Stiffness better.   Skin:  Negative for color change and rash.  Neurological:  Negative for dizziness.       No headache - the last several  days.   Psychiatric/Behavioral:  Negative for agitation and dysphoric mood.        Objective:     BP (!) 144/88   Pulse 86   Temp 98.1 F (36.7 C)   Resp 16   Ht 5' 5"$  (1.651 m)   Wt 239 lb (108.4 kg)   SpO2 98%   BMI 39.77 kg/m  Wt Readings from Last 3 Encounters:  03/29/22 239 lb (108.4 kg)  02/24/22 245 lb 9.6 oz (111.4 kg)  01/03/22 242 lb 3.2 oz (109.9 kg)    Physical Exam Vitals reviewed.  Constitutional:      General: She is not in acute distress.    Appearance: Normal appearance.  HENT:     Head: Normocephalic and atraumatic.     Right Ear: External ear normal.     Left Ear: External ear normal.  Eyes:     General: No scleral icterus.       Right eye: No discharge.        Left eye: No discharge.     Conjunctiva/sclera: Conjunctivae normal.  Neck:     Thyroid: No thyromegaly.  Cardiovascular:     Rate and Rhythm: Normal rate and regular rhythm.  Pulmonary:     Effort: No respiratory distress.     Breath sounds: Normal breath sounds. No wheezing.  Abdominal:     General: Bowel sounds are normal.     Palpations: Abdomen is soft.     Tenderness: There is no abdominal tenderness.  Musculoskeletal:        General: No tenderness.     Cervical back: Neck supple. No tenderness.     Comments: No increased swelling.   Lymphadenopathy:     Cervical: No cervical adenopathy.  Skin:    Findings: No erythema or rash.  Neurological:     Mental Status: She is alert.  Psychiatric:        Mood and Affect: Mood normal.        Behavior: Behavior normal.      Outpatient Encounter Medications as of 03/29/2022  Medication Sig   EPINEPHrine (EPIPEN 2-PAK) 0.3 mg/0.3 mL IJ SOAJ injection Inject 0.3 mg into the muscle as needed for anaphylaxis.   metoprolol succinate (TOPROL-XL) 25 MG 24 hr tablet Take 12.5 mg by mouth daily.   NALTREXONE HCL PO Take 2 mg by mouth daily.   Syringe/Needle, Disp, (SYRINGE 3CC/25GX1") 25G X 1" 3 ML MISC Use as directed with b12  injections.   VITAMIN D PO Take by mouth.   [DISCONTINUED] hydrALAZINE (APRESOLINE) 25 MG tablet Take 1 tablet (25 mg total) by mouth 3 (  three) times daily.   [DISCONTINUED] ivermectin (STROMECTOL) 3 MG TABS tablet Take by mouth once.   [DISCONTINUED] ketoconazole (NIZORAL) 2 % cream Apply to areas of rash twice daily as needed.   [DISCONTINUED] spironolactone (ALDACTONE) 25 MG tablet Take 0.5 tablets (12.5 mg total) by mouth daily.   No facility-administered encounter medications on file as of 03/29/2022.     Lab Results  Component Value Date   WBC 7.3 02/22/2022   HGB 14.5 02/22/2022   HCT 42.4 02/22/2022   PLT 206.0 02/22/2022   GLUCOSE 114 (H) 02/22/2022   CHOL 199 02/22/2022   TRIG 88.0 02/22/2022   HDL 65.30 02/22/2022   LDLDIRECT 145.4 03/01/2013   LDLCALC 116 (H) 02/22/2022   ALT 34 02/22/2022   AST 23 02/22/2022   NA 140 02/22/2022   K 4.2 02/22/2022   CL 106 02/22/2022   CREATININE 0.63 02/22/2022   BUN 11 02/22/2022   CO2 24 02/22/2022   TSH 4.88 02/22/2022   HGBA1C 5.8 02/22/2022   MICROALBUR 1.7 02/24/2022    No results found.     Assessment & Plan:  Abnormal liver function Assessment & Plan: Diet and exercise. Follow liver panel. Recent check wnl.    Hyperlipidemia, mixed Assessment & Plan: Low cholesterol diet and exercise.  Follow lipid panel.    Primary hypertension Assessment & Plan: Blood pressure remains elevated. Currently metoprolol and hydralazine.  Taking hydralazine 92m tid now - scheduled.  Blood pressure elevated today.  Still elevated above goal.  Have discussed treatment options.  Allegic to ACE inhibitor.  Was on hctz when had angioedema.  Intolerance to amlodipine.  Have discussed increasing and titrating hydralazine. Have discussed spironolactone and changing metoprolol to carvedilol. She desires not to change medication at this time. Follow pressures. Follow metabolic panel.    Loose stools Assessment & Plan: Persistent loose  stool.  Appears to be worse after covid.  Is s/p cholecystectomy.  Was present prior to having her gallbladder removed.  Saw GI.  Prescribed colestid.  Has not started.  Wants to hold.  Has adjusted diet.     Paroxysmal A-fib ([facility] Assessment & Plan: S/p ablation.  Off xarelto.  Afib/symptoms improved.  On 1/2 metoprolol. Follow pressures.  Breathing better.  Have discussed anticoagulation. She has declined.  Recently noticed some increased heart rate and sob with walking.  (Intermittent).  Saw cardiology.  No changes made. Continue current medication.  Follow.    Type 2 diabetes mellitus with hyperglycemia, without long-term current use of insulin (HCC) Assessment & Plan: Continue low carb diet and exercise.  Follow met b and a1c.   Lab Results  Component Value Date   HGBA1C 5.8 02/22/2022        CEinar Pheasant MD

## 2022-04-05 ENCOUNTER — Other Ambulatory Visit: Payer: Self-pay | Admitting: Internal Medicine

## 2022-04-09 ENCOUNTER — Encounter: Payer: Self-pay | Admitting: Internal Medicine

## 2022-04-09 NOTE — Assessment & Plan Note (Signed)
Low cholesterol diet and exercise.  Follow lipid panel.   

## 2022-04-09 NOTE — Assessment & Plan Note (Signed)
Persistent loose stool.  Appears to be worse after covid.  Is s/p cholecystectomy.  Was present prior to having her gallbladder removed.  Saw GI.  Prescribed colestid.  Has not started.  Wants to hold.  Has adjusted diet.

## 2022-04-09 NOTE — Assessment & Plan Note (Signed)
S/p ablation.  Off xarelto.  Afib/symptoms improved.  On 1/2 metoprolol. Follow pressures.  Breathing better.  Have discussed anticoagulation. She has declined.  Recently noticed some increased heart rate and sob with walking.  (Intermittent).  Saw cardiology.  No changes made. Continue current medication.  Follow.

## 2022-04-09 NOTE — Assessment & Plan Note (Signed)
Continue low carb diet and exercise.  Follow met b and a1c.   Lab Results  Component Value Date   HGBA1C 5.8 02/22/2022

## 2022-04-09 NOTE — Assessment & Plan Note (Signed)
Blood pressure remains elevated. Currently metoprolol and hydralazine.  Taking hydralazine 68m tid now - scheduled.  Blood pressure elevated today.  Still elevated above goal.  Have discussed treatment options.  Allegic to ACE inhibitor.  Was on hctz when had angioedema.  Intolerance to amlodipine.  Have discussed increasing and titrating hydralazine. Have discussed spironolactone and changing metoprolol to carvedilol. She desires not to change medication at this time. Follow pressures. Follow metabolic panel.

## 2022-04-09 NOTE — Assessment & Plan Note (Signed)
Diet and exercise. Follow liver panel. Recent check wnl.

## 2022-04-22 DIAGNOSIS — E119 Type 2 diabetes mellitus without complications: Secondary | ICD-10-CM | POA: Diagnosis not present

## 2022-05-11 ENCOUNTER — Ambulatory Visit: Payer: BC Managed Care – PPO | Admitting: Internal Medicine

## 2022-05-11 ENCOUNTER — Encounter: Payer: Self-pay | Admitting: Internal Medicine

## 2022-05-11 VITALS — BP 138/88 | HR 76 | Temp 97.9°F | Resp 16 | Ht 65.0 in | Wt 234.0 lb

## 2022-05-11 DIAGNOSIS — I10 Essential (primary) hypertension: Secondary | ICD-10-CM | POA: Diagnosis not present

## 2022-05-11 DIAGNOSIS — I480 Paroxysmal atrial fibrillation: Secondary | ICD-10-CM

## 2022-05-11 DIAGNOSIS — E782 Mixed hyperlipidemia: Secondary | ICD-10-CM

## 2022-05-11 DIAGNOSIS — E559 Vitamin D deficiency, unspecified: Secondary | ICD-10-CM

## 2022-05-11 DIAGNOSIS — R195 Other fecal abnormalities: Secondary | ICD-10-CM

## 2022-05-11 DIAGNOSIS — Z1231 Encounter for screening mammogram for malignant neoplasm of breast: Secondary | ICD-10-CM | POA: Diagnosis not present

## 2022-05-11 DIAGNOSIS — K760 Fatty (change of) liver, not elsewhere classified: Secondary | ICD-10-CM

## 2022-05-11 DIAGNOSIS — E1165 Type 2 diabetes mellitus with hyperglycemia: Secondary | ICD-10-CM | POA: Diagnosis not present

## 2022-05-11 DIAGNOSIS — G4733 Obstructive sleep apnea (adult) (pediatric): Secondary | ICD-10-CM

## 2022-05-11 NOTE — Progress Notes (Signed)
Subjective:    Patient ID: Joy Houston, female    DOB: [birth year], 57 y.o.   MRN: [id number]  Patient here for  Chief Complaint  Patient presents with   Medical Management of Chronic Issues    HPI Here to follow up regarding hypertension, diabetes and hypercholesterolemia.  Reports she is doing relatively well.  Blood pressure still elevated.  She is taking her medication on a regular basis.  Discussed need to treat sleep apnea.  Discussed importance of using her sleep device on a regular basis.  No chest pain reported.  Overall - heart rate/rhythm - improved.  Breathing overall stable.  No increased cough or congestion.  Feels better on naltrexone.  Seeing Dr Jimmye Norman.  Prescribing naltrexone.  Also he is replacing her vitamin D.  Handling stress.     Past Medical History:  Diagnosis Date   Anemia    Angio-edema    Atrial fibrillation ([place])    Complication of anesthesia    Diabetes mellitus without complication ([place])    diet controlled   Dysplastic nevus 06/27/2018   Left upper back. Moderate atypia, limited margins free.    Eczema    Epstein Barr infection    Family history of anesthesia complication    Mother - confusion   Fatty liver    Heart murmur    Slight - "nothing to worry about"   Hemorrhoid    Hypertension    Hypothyroidism    PONV (postoperative nausea and vomiting)    "only with c-sections"   Sleep apnea    mild   Past Surgical History:  Procedure Laterality Date   CESAREAN SECTION     CHOLECYSTECTOMY N/A 09/07/2018   Procedure: LAPAROSCOPIC CHOLECYSTECTOMY;  Surgeon: Herbert Pun, MD;  Location: [facility] ORS;  Service: General;  Laterality: N/A;   FINGER SURGERY     pin to 3rd finger Right hand   LUMBAR LAMINECTOMY/DECOMPRESSION MICRODISCECTOMY  12/15/2011   Procedure: LUMBAR LAMINECTOMY/DECOMPRESSION MICRODISCECTOMY 1 LEVEL;  Surgeon: Ophelia Charter, MD;  Location: [place] [facility];  Service: Neurosurgery;  Laterality: Right;  RIGHT Lumbar  four-Five diskectomy   LUMBAR LAMINECTOMY/DECOMPRESSION MICRODISCECTOMY N/A 11/04/2019   Procedure: MICRODISCECTOMY LEFT LUMBAR FIVE SACRAL ONE;  Surgeon: Newman Pies, MD;  Location: [place];  Service: Neurosurgery;  Laterality: N/A;  3C   MICRODISCECTOMY LUMBAR     L4-5   WISDOM TOOTH EXTRACTION     Family History  Problem Relation Age of Onset   GER disease Mother    Diabetes Father    Cancer Father        Melanoma, Prostate   Heart disease Father    Heart disease Maternal Grandfather    Diabetes Paternal Grandfather    Breast cancer Sister 95   Social History   Socioeconomic History   Marital status: Married    Spouse name: Not on file   Number of children: 2   Years of education: Not on file   Highest education level: Not on file  Occupational History    Employer: [employer]  Tobacco Use   Smoking status: Never   Smokeless tobacco: Never  Vaping Use   Vaping Use: Never used  Substance and Sexual Activity   Alcohol use: Not Currently    Alcohol/week: 0.0 standard drinks of alcohol   Drug use: No   Sexual activity: Not on file  Other Topics Concern   Not on file  Social History Narrative   Nurse    Married   Social  Determinants of Health   Financial Resource Strain: Not on file  Food Insecurity: Not on file  Transportation Needs: Not on file  Physical Activity: Not on file  Stress: Not on file  Social Connections: Not on file     Review of Systems  Constitutional:  Negative for appetite change and unexpected weight change.  HENT:  Negative for congestion and sinus pressure.   Respiratory:  Negative for cough and chest tightness.        Breathing stable.   Cardiovascular:  Negative for chest pain and palpitations.  Gastrointestinal:  Negative for nausea and vomiting.       Still with loose stool as outlined.    Genitourinary:  Negative for difficulty urinating and dysuria.  Musculoskeletal:  Negative for joint swelling and myalgias.  Skin:   Negative for color change and rash.  Neurological:  Negative for dizziness.       Occasional intermittent headache.   Psychiatric/Behavioral:  Negative for agitation and dysphoric mood.        Objective:     BP 138/88   Pulse 76   Temp 97.9 F (36.6 C)   Resp 16   Ht 5\' 5"  (1.651 m)   Wt 234 lb (106.1 kg)   SpO2 98%   BMI 38.94 kg/m  Wt Readings from Last 3 Encounters:  05/11/22 234 lb (106.1 kg)  03/29/22 239 lb (108.4 kg)  02/24/22 245 lb 9.6 oz (111.4 kg)    Physical Exam Vitals reviewed.  Constitutional:      General: She is not in acute distress.    Appearance: Normal appearance.  HENT:     Head: Normocephalic and atraumatic.     Right Ear: External ear normal.     Left Ear: External ear normal.  Eyes:     General: No scleral icterus.       Right eye: No discharge.        Left eye: No discharge.     Conjunctiva/sclera: Conjunctivae normal.  Neck:     Thyroid: No thyromegaly.  Cardiovascular:     Rate and Rhythm: Normal rate and regular rhythm.  Pulmonary:     Effort: No respiratory distress.     Breath sounds: Normal breath sounds. No wheezing.  Abdominal:     General: Bowel sounds are normal.     Palpations: Abdomen is soft.     Tenderness: There is no abdominal tenderness.  Musculoskeletal:        General: No swelling or tenderness.     Cervical back: Neck supple. No tenderness.  Lymphadenopathy:     Cervical: No cervical adenopathy.  Skin:    Findings: No erythema or rash.  Neurological:     Mental Status: She is alert.  Psychiatric:        Mood and Affect: Mood normal.        Behavior: Behavior normal.      Outpatient Encounter Medications as of 05/11/2022  Medication Sig   EPINEPHrine (EPIPEN 2-PAK) 0.3 mg/0.3 mL IJ SOAJ injection Inject 0.3 mg into the muscle as needed for anaphylaxis.   hydrALAZINE (APRESOLINE) 25 MG tablet TAKE 1 TABLET BY MOUTH THREE TIMES A DAY   metoprolol succinate (TOPROL-XL) 25 MG 24 hr tablet Take 12.5 mg by  mouth daily.   NALTREXONE HCL PO Take 2 mg by mouth daily.   Syringe/Needle, Disp, (SYRINGE 3CC/25GX1") 25G X 1" 3 ML MISC Use as directed with b12 injections.   VITAMIN D PO Take by mouth.  No facility-administered encounter medications on file as of 05/11/2022.     Lab Results  Component Value Date   WBC 7.3 02/22/2022   HGB 14.5 02/22/2022   HCT 42.4 02/22/2022   PLT 206.0 02/22/2022   GLUCOSE 114 (H) 02/22/2022   CHOL 199 02/22/2022   TRIG 88.0 02/22/2022   HDL 65.30 02/22/2022   LDLDIRECT 145.4 03/01/2013   LDLCALC 116 (H) 02/22/2022   ALT 34 02/22/2022   AST 23 02/22/2022   NA 140 02/22/2022   K 4.2 02/22/2022   CL 106 02/22/2022   CREATININE 0.63 02/22/2022   BUN 11 02/22/2022   CO2 24 02/22/2022   TSH 4.88 02/22/2022   HGBA1C 5.8 02/22/2022   MICROALBUR 1.7 02/24/2022    No results found.     Assessment & Plan:  Visit for screening mammogram -     3D Screening Mammogram, Left and Right; Future  Hyperlipidemia, mixed Assessment & Plan: Low cholesterol diet and exercise.  Follow lipid panel.   Orders: -     Lipid panel; Future -     Hepatic function panel; Future  Type 2 diabetes mellitus with hyperglycemia, without long-term current use of insulin ([facility]) Assessment & Plan: Continue low carb diet and exercise.  Follow met b and a1c.   Lab Results  Component Value Date   HGBA1C 5.8 02/22/2022    Orders: -     Hemoglobin A1c; Future  Primary hypertension Assessment & Plan: Blood pressure remains elevated. Currently metoprolol and hydralazine.  Taking hydralazine 25mg  tid now - scheduled.  Blood pressure elevated today.  Still elevated above goal.  Have discussed treatment options.  Allegic to ACE inhibitor.  Was on hctz when had angioedema.  Intolerance to amlodipine.  Have discussed increasing and titrating hydralazine. Have discussed spironolactone and changing metoprolol to carvedilol. She desires not to change medication at this time. Follow  pressures. Follow metabolic panel.   Orders: -     Basic metabolic panel; Future  Vitamin D deficiency Assessment & Plan: Being treated - Dr Jimmye Norman.    Paroxysmal A-fib [facility]) Assessment & Plan: S/p ablation.  Off xarelto.  Afib/symptoms improved.  On 1/2 metoprolol. Follow pressures.  Breathing better.  Have discussed anticoagulation. She has declined.  Followed by cardiology.  No changes made. Continue current medication.  Follow.    OSA (obstructive sleep apnea) Assessment & Plan: Has oral device. Needs to use regularly.  Follow.    Loose stools Assessment & Plan: Persistent loose stool.  Appears to be worse after covid.  Is s/p cholecystectomy.  Was present prior to having her gallbladder removed.  Saw GI.  Prescribed colestid.  Has not started.  Wants to hold.  Has adjusted diet.     Fatty liver Assessment & Plan: Previously reported on ultrasound 2020.  Recent liver panel wnl.       Einar Pheasant, MD

## 2022-05-16 ENCOUNTER — Encounter: Payer: Self-pay | Admitting: Internal Medicine

## 2022-05-17 MED ORDER — HYDROCORTISONE ACETATE 25 MG RE SUPP: 25.0000 mg | Freq: Two times a day (BID) | RECTAL | 0 refills | Status: DC | PRN

## 2022-05-17 NOTE — Telephone Encounter (Signed)
Rx sent in for hydrocortisone suppositories. Let us know if persistent problems.

## 2022-05-21 ENCOUNTER — Other Ambulatory Visit: Payer: Self-pay | Admitting: Internal Medicine

## 2022-05-22 ENCOUNTER — Encounter: Payer: Self-pay | Admitting: Internal Medicine

## 2022-05-22 NOTE — Assessment & Plan Note (Signed)
S/p ablation.  Off xarelto.  Afib/symptoms improved.  On 1/2 metoprolol. Follow pressures.  Breathing better.  Have discussed anticoagulation. She has declined.  Followed by cardiology.  No changes made. Continue current medication.  Follow.

## 2022-05-22 NOTE — Assessment & Plan Note (Signed)
Low cholesterol diet and exercise.  Follow lipid panel.   

## 2022-05-22 NOTE — Assessment & Plan Note (Signed)
Continue low carb diet and exercise.  Follow met b and a1c.   Lab Results  Component Value Date   HGBA1C 5.8 02/22/2022   

## 2022-05-22 NOTE — Assessment & Plan Note (Signed)
Persistent loose stool.  Appears to be worse after covid.  Is s/p cholecystectomy.  Was present prior to having her gallbladder removed.  Saw GI.  Prescribed colestid.  Has not started.  Wants to hold.  Has adjusted diet.   

## 2022-05-22 NOTE — Assessment & Plan Note (Signed)
Previously reported on ultrasound 2020.  Recent liver panel wnl.  

## 2022-05-22 NOTE — Assessment & Plan Note (Signed)
Being treated - Dr Jimmye Norman.

## 2022-05-22 NOTE — Assessment & Plan Note (Signed)
Blood pressure remains elevated. Currently metoprolol and hydralazine.  Taking hydralazine 25mg tid now - scheduled.  Blood pressure elevated today.  Still elevated above goal.  Have discussed treatment options.  Allegic to ACE inhibitor.  Was on hctz when had angioedema.  Intolerance to amlodipine.  Have discussed increasing and titrating hydralazine. Have discussed spironolactone and changing metoprolol to carvedilol. She desires not to change medication at this time. Follow pressures. Follow metabolic panel.  

## 2022-05-22 NOTE — Assessment & Plan Note (Signed)
Has oral device. Needs to use regularly.  Follow.

## 2022-05-23 DIAGNOSIS — E119 Type 2 diabetes mellitus without complications: Secondary | ICD-10-CM | POA: Diagnosis not present

## 2022-05-24 NOTE — Telephone Encounter (Signed)
Please refuse this. Patient is not taking.

## 2022-06-06 DIAGNOSIS — E119 Type 2 diabetes mellitus without complications: Secondary | ICD-10-CM | POA: Diagnosis not present

## 2022-06-06 DIAGNOSIS — H43399 Other vitreous opacities, unspecified eye: Secondary | ICD-10-CM | POA: Diagnosis not present

## 2022-06-06 DIAGNOSIS — G43109 Migraine with aura, not intractable, without status migrainosus: Secondary | ICD-10-CM | POA: Diagnosis not present

## 2022-06-06 LAB — HM DIABETES EYE EXAM

## 2022-06-09 ENCOUNTER — Encounter: Payer: Self-pay | Admitting: Internal Medicine

## 2022-06-09 ENCOUNTER — Ambulatory Visit: Payer: BC Managed Care – PPO | Admitting: Internal Medicine

## 2022-06-09 VITALS — BP 148/88 | HR 86 | Temp 97.4°F | Resp 17 | Ht 65.0 in | Wt 239.5 lb

## 2022-06-09 DIAGNOSIS — I10 Essential (primary) hypertension: Secondary | ICD-10-CM

## 2022-06-09 DIAGNOSIS — I480 Paroxysmal atrial fibrillation: Secondary | ICD-10-CM

## 2022-06-09 DIAGNOSIS — R197 Diarrhea, unspecified: Secondary | ICD-10-CM

## 2022-06-09 DIAGNOSIS — E782 Mixed hyperlipidemia: Secondary | ICD-10-CM | POA: Diagnosis not present

## 2022-06-09 DIAGNOSIS — R945 Abnormal results of liver function studies: Secondary | ICD-10-CM

## 2022-06-09 DIAGNOSIS — G4733 Obstructive sleep apnea (adult) (pediatric): Secondary | ICD-10-CM

## 2022-06-09 DIAGNOSIS — E1165 Type 2 diabetes mellitus with hyperglycemia: Secondary | ICD-10-CM

## 2022-06-09 MED ORDER — CARVEDILOL 6.25 MG PO TABS: 6.2500 mg | ORAL_TABLET | Freq: Two times a day (BID) | ORAL | 3 refills | Status: DC

## 2022-06-09 NOTE — Progress Notes (Signed)
ca  Subjective:    Patient ID: Joy Houston, female    DOB: Aug 17, 1965, 57 y.o.   MRN: [id number]  Patient here for  Chief Complaint  Patient presents with   Follow-up    4 week follow-up on BP  & c/o heart palpitations occasionally-no chest pain or sob. H/O ablation. Needs new referral to [facility] cardiology.    HPI Here to follow up regarding hypertension, diabetes and hypercholesterolemia.  Blood pressure has remained elevated.  Currently on hydralazine - 25mg  tid and metoprolol.  Allergic to ACE inhibitor. Was on hctz when had angioedema. Intolerance to amlodipine. Have discussed increasing and titrating hydralazine. Have discussed adding spironolactone or changing metoprolol to carvedilol. She has been resistant in the past.  Discussed again today.  Agreeable to changing to carvedilol.  Discussed importance of getting blood pressure under control.  Has noticed recently increased palpitations.  Still overall better than initially, but has noticed some increase.  Also some difficulty with inclines - sob with increased exertion/inclines.  She is s/p ablation.  She has done better since ablation.  Off xarelto.  On metoprolol currently.  Discussed f/u with cardiology.  Agreeable.  Has declined to restart anticoagulation.  Continues to follow up with Dr Mayford Knife.  He has he ron naltrexone.  Recent course of ivermectin.  Continues to have loose stool.    Past Medical History:  Diagnosis Date   Anemia    Angio-edema    Atrial fibrillation    Complication of anesthesia    Diabetes mellitus without complication    diet controlled   Dysplastic nevus 06/27/2018   Left upper back. Moderate atypia, limited margins free.    Eczema    Epstein Barr infection    Family history of anesthesia complication    Mother - confusion   Fatty liver    Heart murmur    Slight - "nothing to worry about"   Hemorrhoid    Hypertension    Hypothyroidism    PONV (postoperative nausea and vomiting)    "only  with c-sections"   Sleep apnea    mild   Past Surgical History:  Procedure Laterality Date   CESAREAN SECTION     CHOLECYSTECTOMY N/A 09/07/2018   Procedure: LAPAROSCOPIC CHOLECYSTECTOMY;  Surgeon: Carolan Shiver, MD;  Location: [facility] ORS;  Service: General;  Laterality: N/A;   FINGER SURGERY     pin to 3rd finger Right hand   LUMBAR LAMINECTOMY/DECOMPRESSION MICRODISCECTOMY  12/15/2011   Procedure: LUMBAR LAMINECTOMY/DECOMPRESSION MICRODISCECTOMY 1 LEVEL;  Surgeon: Cristi Loron, MD;  Location: [facility];  Service: Neurosurgery;  Laterality: Right;  RIGHT Lumbar four-Five diskectomy   LUMBAR LAMINECTOMY/DECOMPRESSION MICRODISCECTOMY N/A 11/04/2019   Procedure: MICRODISCECTOMY LEFT LUMBAR FIVE SACRAL ONE;  Surgeon: Tressie Stalker, MD;  Location: [facility] OR;  Service: Neurosurgery;  Laterality: N/A;  3C   MICRODISCECTOMY LUMBAR     L4-5   WISDOM TOOTH EXTRACTION     Family History  Problem Relation Age of Onset   GER disease Mother    Diabetes Father    Cancer Father        Melanoma, Prostate   Heart disease Father    Heart disease Maternal Grandfather    Diabetes Paternal Grandfather    Breast cancer Sister 76   Social History   Socioeconomic History   Marital status: Married    Spouse name: Not on file   Number of children: 2   Years of education: Not on file   Highest education level:  Not on file  Occupational History    Employer: [employer]  Tobacco Use   Smoking status: Never   Smokeless tobacco: Never  Vaping Use   Vaping Use: Never used  Substance and Sexual Activity   Alcohol use: Not Currently    Alcohol/week: 0.0 standard drinks of alcohol   Drug use: No   Sexual activity: Not on file  Other Topics Concern   Not on file  Social History Narrative   Nurse    Married   Social Determinants of Health   Financial Resource Strain: Not on file  Food Insecurity: Not on file  Transportation Needs: Not on file  Physical Activity: Not on file   Stress: Not on file  Social Connections: Not on file     Review of Systems  Constitutional:  Negative for appetite change and unexpected weight change.  HENT:  Negative for congestion and sinus pressure.   Respiratory:  Negative for cough and chest tightness.        SOB with exertion - inclines.   Cardiovascular:  Positive for palpitations. Negative for chest pain.       No increased swelling.    Genitourinary:  Negative for difficulty urinating and dysuria.  Musculoskeletal:  Negative for joint swelling and myalgias.  Skin:  Negative for color change and rash.  Neurological:  Negative for dizziness and headaches.  Psychiatric/Behavioral:  Negative for agitation and dysphoric mood.        Objective:     BP (!) 148/88   Pulse 86   Temp (!) 97.4 F (36.3 C) (Oral)   Resp 17   Ht  (1.651 m)   Wt 239 lb 8 oz (108.6 kg)   SpO2 97%   BMI 39.85 kg/m  Wt Readings from Last 3 Encounters:  06/09/22 239 lb 8 oz (108.6 kg)  05/11/22 234 lb (106.1 kg)  03/29/22 239 lb (108.4 kg)    Physical Exam Vitals reviewed.  Constitutional:      General: She is not in acute distress.    Appearance: Normal appearance.  HENT:     Head: Normocephalic and atraumatic.     Right Ear: External ear normal.     Left Ear: External ear normal.  Eyes:     General: No scleral icterus.       Right eye: No discharge.        Left eye: No discharge.     Conjunctiva/sclera: Conjunctivae normal.  Neck:     Thyroid: No thyromegaly.  Cardiovascular:     Rate and Rhythm: Normal rate and regular rhythm.  Pulmonary:     Effort: No respiratory distress.     Breath sounds: Normal breath sounds. No wheezing.  Abdominal:     General: Bowel sounds are normal.     Palpations: Abdomen is soft.     Tenderness: There is no abdominal tenderness.  Musculoskeletal:        General: No swelling or tenderness.     Cervical back: Neck supple. No tenderness.  Lymphadenopathy:     Cervical: No cervical  adenopathy.  Skin:    Findings: No erythema or rash.  Neurological:     Mental Status: She is alert.  Psychiatric:        Mood and Affect: Mood normal.        Behavior: Behavior normal.      Outpatient Encounter Medications as of 06/09/2022  Medication Sig   carvedilol (COREG) 6.25 MG tablet Take 1 tablet (6.25  mg total) by mouth 2 (two) times daily with a meal.   EPINEPHrine (EPIPEN 2-PAK) 0.3 mg/0.3 mL IJ SOAJ injection Inject 0.3 mg into the muscle as needed for anaphylaxis.   hydrALAZINE (APRESOLINE) 25 MG tablet TAKE 1 TABLET BY MOUTH THREE TIMES A DAY   hydrocortisone (ANUSOL-HC) 25 MG suppository Place 1 suppository (25 mg total) rectally 2 (two) times daily as needed for hemorrhoids or anal itching.   NALTREXONE HCL PO Take 2 mg by mouth daily.   Syringe/Needle, Disp, (SYRINGE 3CC/25GX1") 25G X 1" 3 ML MISC Use as directed with b12 injections.   VITAMIN D PO Take by mouth.   [DISCONTINUED] metoprolol succinate (TOPROL-XL) 25 MG 24 hr tablet Take 25 mg by mouth daily.   No facility-administered encounter medications on file as of 06/09/2022.     Lab Results  Component Value Date   WBC 7.3 02/22/2022   HGB 14.5 02/22/2022   HCT 42.4 02/22/2022   PLT 206.0 02/22/2022   GLUCOSE 114 (H) 02/22/2022   CHOL 199 02/22/2022   TRIG 88.0 02/22/2022   HDL 65.30 02/22/2022   LDLDIRECT 145.4 03/01/2013   LDLCALC 116 (H) 02/22/2022   ALT 34 02/22/2022   AST 23 02/22/2022   NA 140 02/22/2022   K 4.2 02/22/2022   CL 106 02/22/2022   CREATININE 0.63 02/22/2022   BUN 11 02/22/2022   CO2 24 02/22/2022   TSH 4.88 02/22/2022   HGBA1C 5.8 02/22/2022   MICROALBUR 1.7 02/24/2022    No results found.     Assessment & Plan:  Paroxysmal A-fib Assessment & Plan: S/p ablation.  Off xarelto.  Afib/symptoms improved overall.  Has noticed increased palpitations recently with sob with inclines. EKG today - SR - no acute ischemic changes.  On metoprolol.  Agreeable to day to change to  carvedilol.  Will change to carvedilol 6.25mg  bid as outlined. Follow pressures. Has been followed by cardiology - Dr Gwen Pounds.  He has moved. Recommend f/u with cardiology. Referral placed to establish care with new cardiologist - given above.   Orders: -     Ambulatory referral to Cardiology -     EKG 12-Lead  Abnormal liver function Assessment & Plan: Diet and exercise. Follow liver panel.    Diarrhea, unspecified type Assessment & Plan: Previous stool studies negative.  She has had persistent problems with diarrhea. (Ongoing for years). Symptoms seem to have been more noticeable after cholecystectomy 08/2018.  Work up to date has included - TTgIGA negative.  Lipase wnl.  CT abd/pelvis - diverticulosis, otherwise negative. Colonoscopy negative for microscopic colitis.  TSH/T4 negative.  No weight loss.  Eating.  No nausea or vomiting.   Discussed history of loose stools.  Declines cholestyramine.   Hyperlipidemia, mixed Assessment & Plan: Low cholesterol diet and exercise.  Follow lipid panel.    Primary hypertension Assessment & Plan: Blood pressure remains elevated. Currently on metoprolol and hydralazine.  Taking hydralazine 25mg  tid now - scheduled.  Blood pressure elevated today.  Still elevated above goal.  Have discussed treatment options.  Allegic to ACE inhibitor.  Was on hctz when had angioedema.  Intolerance to amlodipine.  Have discussed increasing and titrating hydralazine. Have discussed spironolactone or changing metoprolol to carvedilol. She was agreeable to change to carvedilol.  Wants to start low dose.  Will stop metoprolol and start carvedilol 6.25mg  bid with plans to titrate soon if blood pressure remains elevated. Follow pressures. Follow metabolic panel.    OSA (obstructive sleep apnea) Assessment &  Plan: Has oral device. Needs to use regularly.  Follow.    Type 2 diabetes mellitus with hyperglycemia, without long-term current use of insulin Assessment &  Plan: Continue low carb diet and exercise.  Follow met b and a1c.   Lab Results  Component Value Date   HGBA1C 5.8 02/22/2022     Other orders -     Carvedilol; Take 1 tablet (6.25 mg total) by mouth 2 (two) times daily with a meal.  Dispense: 60 tablet; Refill: 3   I spent 45 minutes with the patient.  Time spent discussing her current concerns and symptoms.  Specifically time spent discussing her blood pressure and current symptoms of increased palpitations and sob. Time also spent discussing further w/up, evaluation and treatment.    Dale Alpine, MD

## 2022-06-12 ENCOUNTER — Encounter: Payer: Self-pay | Admitting: Internal Medicine

## 2022-06-12 NOTE — Assessment & Plan Note (Addendum)
S/p ablation.  Off xarelto.  Afib/symptoms improved overall.  Has noticed increased palpitations recently with sob with inclines. EKG today - SR - no acute ischemic changes.  On metoprolol.  Agreeable to day to change to carvedilol.  Will change to carvedilol 6.25mg  bid as outlined. Follow pressures. Has been followed by cardiology - Dr Gwen Pounds.  He has moved. Recommend f/u with cardiology. Referral placed to establish care with new cardiologist - given above.

## 2022-06-12 NOTE — Assessment & Plan Note (Signed)
Blood pressure remains elevated. Currently on metoprolol and hydralazine.  Taking hydralazine  tid now - scheduled.  Blood pressure elevated today.  Still elevated above goal.  Have discussed treatment options.  Allegic to ACE inhibitor.  Was on hctz when had angioedema.  Intolerance to amlodipine.  Have discussed increasing and titrating hydralazine. Have discussed spironolactone or changing metoprolol to carvedilol. She was agreeable to change to carvedilol.  Wants to start low dose.  Will stop metoprolol and start carvedilol 6.25mg  bid with plans to titrate soon if blood pressure remains elevated. Follow pressures. Follow metabolic panel.

## 2022-06-12 NOTE — Assessment & Plan Note (Signed)
Has oral device. Needs to use regularly.  Follow.  

## 2022-06-12 NOTE — Assessment & Plan Note (Signed)
Diet and exercise.  Follow liver panel.   

## 2022-06-12 NOTE — Assessment & Plan Note (Signed)
Low cholesterol diet and exercise.  Follow lipid panel.   

## 2022-06-12 NOTE — Assessment & Plan Note (Signed)
Previous stool studies negative.  She has had persistent problems with diarrhea. (Ongoing for years). Symptoms seem to have been more noticeable after cholecystectomy 08/2018.  Work up to date has included - TTgIGA negative.  Lipase wnl.  CT abd/pelvis - diverticulosis, otherwise negative. Colonoscopy negative for microscopic colitis.  TSH/T4 negative.  No weight loss.  Eating.  No nausea or vomiting.   Discussed history of loose stools.  Declines cholestyramine.

## 2022-06-12 NOTE — Assessment & Plan Note (Signed)
Continue low carb diet and exercise.  Follow met b and a1c.   Lab Results  Component Value Date   HGBA1C 5.8 02/22/2022   

## 2022-06-22 DIAGNOSIS — E119 Type 2 diabetes mellitus without complications: Secondary | ICD-10-CM | POA: Diagnosis not present

## 2022-06-27 ENCOUNTER — Telehealth: Payer: Self-pay | Admitting: Internal Medicine

## 2022-06-27 NOTE — Telephone Encounter (Signed)
Called and left message for him that I was returning call.

## 2022-06-27 NOTE — Telephone Encounter (Signed)
Dr Mayford Knife calling in regards to this patient. He is aware that you are seeing patients this PM.

## 2022-06-27 NOTE — Telephone Encounter (Signed)
[facility]e Ha [facility] called requesting to talk to the provider about the pt

## 2022-06-28 NOTE — Telephone Encounter (Signed)
Noted  

## 2022-07-06 ENCOUNTER — Encounter: Payer: Self-pay | Admitting: Internal Medicine

## 2022-07-06 NOTE — Telephone Encounter (Signed)
I did speak with him.  Discussed medications that have been tried and treatment options.  Yes, we can increase the carvedilol to 12.5mg  bid, but I would continue the hydralazine - given blood pressure is remaining elevated.

## 2022-07-07 ENCOUNTER — Ambulatory Visit (INDEPENDENT_AMBULATORY_CARE_PROVIDER_SITE_OTHER): Payer: BC Managed Care – PPO | Admitting: Dermatology

## 2022-07-07 ENCOUNTER — Encounter: Payer: Self-pay | Admitting: Dermatology

## 2022-07-07 DIAGNOSIS — L720 Epidermal cyst: Secondary | ICD-10-CM

## 2022-07-07 DIAGNOSIS — L304 Erythema intertrigo: Secondary | ICD-10-CM

## 2022-07-07 MED ORDER — HYDROCORTISONE 2.5 % EX OINT: TOPICAL_OINTMENT | CUTANEOUS | 1 refills | Status: DC

## 2022-07-07 MED ORDER — MUPIROCIN 2 % EX OINT: 1.0000 | TOPICAL_OINTMENT | Freq: Every day | CUTANEOUS | 0 refills | Status: DC

## 2022-07-07 MED ORDER — KETOCONAZOLE 2 % EX CREA
TOPICAL_CREAM | CUTANEOUS | 2 refills | Status: DC
Start: 2022-07-07 — End: 2022-09-05

## 2022-07-07 NOTE — Progress Notes (Signed)
   Follow-Up Visit   Subjective  Joy Houston is a 57 y.o. female who presents for the following: inflamed area at vaginal area that has been oozing blood and is uncomfortable.   She also needs refills on creams for rash.  The following portions of the chart were reviewed this encounter and updated as appropriate: medications, allergies, medical history  Review of Systems:  No other skin or systemic complaints except as noted in HPI or Assessment and Plan.  Objective  Well appearing patient in no apparent distress; mood and affect are within normal limits.   A focused examination was performed of the following areas: Genital area  Relevant exam findings are noted in the Assessment and Plan.    Assessment & Plan   EPIDERMAL INCLUSION CYST Exam: Subcutaneous papule at right labia majora and left labia majora.  No sign of infection today. Start mupirocin ointment 2-3 times per day.  Cyst with symptoms and/or recent change.  Discussed surgical excision to remove, including resulting scar and possible recurrence.  Patient will schedule for surgery. Pre-op information given.     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 Currently well-controlled.  Intertrigo is a chronic recurrent rash that occurs in skin fold areas that may be associated with friction; heat; moisture; yeast; fungus; and bacteria.  It is exacerbated by increased movement / activity; sweating; and higher atmospheric temperature.  Treatment Plan Continue ketoconazole cream twice a day as needed for rash.  Continue hydrocortisone 2.5% cream twice a day for up to 2 weeks as needed for rash.   Topical steroids (such as triamcinolone, fluocinolone, fluocinonide, mometasone, clobetasol, halobetasol, betamethasone, hydrocortisone) can cause thinning and lightening of the skin if they are used for too long in the same area. Your physician  has selected the right strength medicine for your problem and area affected on the body. Please use your medication only as directed by your physician to prevent side effects.   Recommend Zeasorb AF powder or other OTC antifungal powder to the area daily to prevent rash recurrence. Other options to help keep the area dry include blow drying the area after bathing or using antiperspirant products such as Duradry to help keep the area dry.   Return for as scheduled.  Anise Salvo, RMA, am acting as scribe for Darden Dates, MD .   Documentation: I have reviewed the above documentation for accuracy and completeness, and I agree with the above.  Darden Dates, MD

## 2022-07-07 NOTE — Patient Instructions (Addendum)
Cyst at groin - Start mupirocin ointment 2-3 times per day   Rash at [facility]  Continue ketoconazole cream twice a day as needed for rash.  Continue hydrocortisone 2.5% cream twice a day for up to 2 weeks as needed for rash.   Topical steroids (such as triamcinolone, fluocinolone, fluocinonide, mometasone, clobetasol, halobetasol, betamethasone, hydrocortisone) can cause thinning and lightening of the skin if they are used for too long in the same area. Your physician has selected the right strength medicine for your problem and area affected on the body. Please use your medication only as directed by your physician to prevent side effects.   Recommend Zeasorb AF powder or other OTC antifungal powder to the area daily to prevent rash recurrence. Other options to help keep the area dry include blow drying the area after bathing or using antiperspirant products such as Duradry to help keep the area dry.  Due to recent changes in healthcare laws, you may see results of your pathology and/or laboratory studies on MyChart before the doctors have had a chance to review them. We understand that in some cases there may be results that are confusing or concerning to you. Please understand that not all results are received at the same time and often the doctors may need to interpret multiple results in order to provide you with the best plan of care or course of treatment. Therefore, we ask that you please give [name] 2 business days to thoroughly review all your results before contacting the office for clarification. Should we see a critical lab result, you will be contacted sooner.   If You Need Anything After Your Visit  If you have any questions or concerns for your doctor, please call our main line at [phone] and press option 4 to reach your doctor's medical assistant. If no one answers, please leave a voicemail as directed and we will return your call as soon as possible. Messages left after 4 pm will be  answered the following business day.   You may also send [name] a message via MyChart. We typically respond to MyChart messages within 1-2 business days.  For prescription refills, please ask your pharmacy to contact our office. Our fax number is [phone].  If you have an urgent issue when the clinic is closed that cannot wait until the next business day, you can page your doctor at the number below.    Please note that while we do our best to be available for urgent issues outside of office hours, we are not available 24/7.   If you have an urgent issue and are unable to reach [name], you may choose to seek medical care at your doctor's office, retail clinic, urgent care center, or emergency room.  If you have a medical emergency, please immediately call 911 or go to the emergency department.  Pager Numbers  - Dr. Gwen Pounds: [phone]  - Dr. Neale Burly: [phone]  - Dr. Roseanne Reno: [phone]  In the event of inclement weather, please call our main line at [phone] for an update on the status of any delays or closures.  Dermatology Medication Tips: Please keep the boxes that topical medications come in in order to help keep track of the instructions about where and how to use these. Pharmacies typically print the medication instructions only on the boxes and not directly on the medication tubes.   If your medication is too expensive, please contact our office at [phone] option 4 or send [name] a message through MyChart.  We are unable to tell what your co-pay for medications will be in advance as this is different depending on your insurance coverage. However, we may be able to find a substitute medication at lower cost or fill out paperwork to get insurance to cover a needed medication.   If a prior authorization is required to get your medication covered by your insurance company, please allow [name] 1-2 business days to complete this process.  Drug prices often vary depending on  where the prescription is filled and some pharmacies may offer cheaper prices.  The website www.goodrx.com contains coupons for medications through different pharmacies. The prices here do not account for what the cost may be with help from insurance (it may be cheaper with your insurance), but the website can give you the price if you did not use any insurance.  - You can print the associated coupon and take it with your prescription to the pharmacy.  - You may also stop by our office during regular business hours and pick up a GoodRx coupon card.  - If you need your prescription sent electronically to a different pharmacy, notify our office through [facility] or by phone at [phone] option 4.

## 2022-07-07 NOTE — Telephone Encounter (Signed)
FYI- Patient does not wish to increase her carvedilol at this time. She has started taking the supplements listed below. Agreed to continue her current regimen of carvedilol 6.25 mg bid and the hydralazine. She will monitor BP and bring readings to her appointment in a couple of weeks.

## 2022-07-15 ENCOUNTER — Other Ambulatory Visit: Payer: Self-pay

## 2022-07-15 ENCOUNTER — Emergency Department
Admission: EM | Admit: 2022-07-15 | Discharge: 2022-07-15 | Disposition: A | Discharge status: Home / Self Care | Payer: BC Managed Care – PPO | Attending: Emergency Medicine | Admitting: Emergency Medicine

## 2022-07-15 DIAGNOSIS — T171XXA Foreign body in nostril, initial encounter: Secondary | ICD-10-CM | POA: Diagnosis not present

## 2022-07-15 DIAGNOSIS — X58XXXA Exposure to other specified factors, initial encounter: Secondary | ICD-10-CM | POA: Insufficient documentation

## 2022-07-15 DIAGNOSIS — J3489 Other specified disorders of nose and nasal sinuses: Secondary | ICD-10-CM | POA: Diagnosis not present

## 2022-07-15 DIAGNOSIS — J310 Chronic rhinitis: Secondary | ICD-10-CM | POA: Diagnosis not present

## 2022-07-15 MED ORDER — LIDOCAINE HCL URETHRAL/MUCOSAL 2 % EX GEL
1.0000 | Freq: Once | CUTANEOUS | Status: AC
  Administered 2022-07-15: 1 via TOPICAL
  Filled 2022-07-15: qty 10

## 2022-07-15 NOTE — ED Provider Notes (Signed)
[facility] Provider Note    Event Date/Time   First MD Initiated Contact with Patient 07/15/22 [phone]     (approximate)   History   Foreign Body in Nose   HPI  Joy Houston is a 57 y.o. female presents to the emergency department with concern for foreign body in her nostril.  Cleaning her nose with a Q-tip and noted that the Q-tip swab came off of the end of the Q-tip.  Felt like there was a foreign body in her nare.  Attempted to blow her nose and was unsuccessful.  Attempted to pull it out but felt like it went further back into her nose.     Physical Exam   Triage Vital Signs: ED Triage Vitals  Enc Vitals Group     BP 07/15/22 0010 (!) 177/95     Pulse Rate 07/15/22 0010 86     Resp 07/15/22 0010 16     Temp 07/15/22 0010 98.4 F (36.9 C)     Temp Source 07/15/22 0010 Oral     SpO2 07/15/22 0010 97 %     Weight 07/15/22 0008 230 lb (104.3 kg)     Height 07/15/22 0008 5\' 6"  (1.676 m)     Head Circumference --      Peak Flow --      Pain Score 07/15/22 0008 0     Pain Loc --      Pain Edu? --      Excl. in GC? --     Most recent vital signs: Vitals:   07/15/22 0010  BP: (!) 177/95  Pulse: 86  Resp: 16  Temp: 98.4 F (36.9 C)  SpO2: 97%    Physical Exam Constitutional:      Appearance: She is well-developed.  HENT:     Head: Atraumatic.     Nose:     Left Nostril: No foreign body or septal hematoma.     Left Turbinates: Swollen.     Comments: Nasal polyp in the left nare. Eyes:     Conjunctiva/sclera: Conjunctivae normal.  Cardiovascular:     Rate and Rhythm: Regular rhythm.  Pulmonary:     Effort: No respiratory distress.  Abdominal:     General: There is no distension.  Musculoskeletal:        General: Normal range of motion.     Cervical back: Normal range of motion.  Skin:    General: Skin is warm.  Neurological:     Mental Status: She is alert. Mental status is at baseline.      IMPRESSION / MDM /  ASSESSMENT AND PLAN / ED COURSE  I reviewed the triage vital signs and the nursing notes.  Differential diagnosis including foreign body, foreign body sensation    Labs (all labs ordered are listed, but only abnormal results are displayed) Labs interpreted as -    Labs Reviewed - No data to display    Attempted to visualize the left nare with video device, no foreign body was visualized.  Attempted sterile Q-tip and small suction device and was unsuccessful of removing foreign body.  No foreign body was visualized.  Patient now feels like the foreign body is lower in her throat and no longer in her nare.  Possible ongoing foreign body sensation.  No concern for aspiration.  Given information to follow-up closely with primary care physician if she continues to have foreign body sensation and given information to follow-up with the ear  nose and throat.  Given return precautions for any signs of an infection.  PROCEDURES:  Critical Care performed: No  .Foreign Body Removal  Date/Time: 07/15/2022 3:03 AM  Performed by: Corena Herter, MD Authorized by: Corena Herter, MD  Consent: Verbal consent obtained. Consent given by: patient Patient understanding: patient states understanding of the procedure being performed Patient identity confirmed: verbally with patient Body area: nose Location details: left nostril  Anesthesia: Local Anesthetic: topical anesthetic (topical lidocaine 2% jelly)  Sedation: Patient sedated: no  Localization method: probed and nasal speculum Removal mechanism: suction Complexity: simple 0 objects recovered. Post-procedure assessment: foreign body not removed Patient tolerance: patient tolerated the procedure well with no immediate complications    Patient's presentation is most consistent with acute illness / injury with system symptoms.   MEDICATIONS ORDERED IN ED: Medications  lidocaine (XYLOCAINE) 2 % jelly 1 Application (1 Application Topical  Given by Other 07/15/22 0044)    FINAL CLINICAL IMPRESSION(S) / ED DIAGNOSES   Final diagnoses:  Nasal foreign body, initial encounter     Rx / DC Orders   ED Discharge Orders     None        Note:  This document was prepared using Dragon voice recognition software and may include unintentional dictation errors.   Corena Herter, MD 07/15/22 [id number]

## 2022-07-15 NOTE — ED Triage Notes (Signed)
Pt to ED via POV c/o qtip cotton in nose. Pt was putting qtip in nose to dry of mucus and she noticed cotton was missing when she pulled it out. Still feels is stuck in nose.

## 2022-07-15 NOTE — Discharge Instructions (Signed)
You are seen in the emergency department given concern for Q-tip that was in your nose.  In the emergency department did not find a Q-tip.  If you continue to have symptoms of feeling like there is a foreign body in your nose follow-up with the ear nose and throat specialist so they can do a scope.  Return to the emergency department for any signs of infection.

## 2022-07-19 ENCOUNTER — Other Ambulatory Visit: Payer: Self-pay | Admitting: Internal Medicine

## 2022-07-20 ENCOUNTER — Ambulatory Visit (INDEPENDENT_AMBULATORY_CARE_PROVIDER_SITE_OTHER): Payer: BC Managed Care – PPO | Admitting: Dermatology

## 2022-07-20 DIAGNOSIS — L720 Epidermal cyst: Secondary | ICD-10-CM | POA: Diagnosis not present

## 2022-07-20 DIAGNOSIS — D492 Neoplasm of unspecified behavior of bone, soft tissue, and skin: Secondary | ICD-10-CM

## 2022-07-20 MED ORDER — IBUPROFEN 800 MG PO TABS: ORAL_TABLET | ORAL | 0 refills | Status: DC

## 2022-07-20 NOTE — Patient Instructions (Signed)
Wound Care Instructions for After Surgery  On the day following your surgery, you should begin doing daily dressing changes until your sutures are removed: Remove the bandage. Cleanse the wound gently with soap and water.  Make sure you then dry the skin surrounding the wound completely or the tape will not stick to the skin. Do not use cotton balls on the wound. After the wound is clean and dry, apply the ointment (either prescription antibiotic prescribed by your doctor or plain Vaseline if nothing was prescribed) gently with a Q-tip. If you are using a bandaid to cover: Apply a bandaid large enough to cover the entire wound. If you do not have a bandaid large enough to cover the wound OR if you are sensitive to bandaid adhesive: Cut a non-stick pad (such as Telfa) to fit the size of the wound.  Cover the wound with the non-stick pad. If the wound is draining, you may want to add a small amount of gauze on top of the non-stick pad for a little added compression to the area. Use tape to seal the area completely.  For the next 1-2 weeks: Be sure to keep the wound moist with ointment 24/7 to ensure best healing. If you are unable to cover the wound with a bandage to hold the ointment in place, you may need to reapply the ointment several times a day. Do not bend over or lift heavy items to reduce the chance of elevated blood pressure to the wound. Do not participate in particularly strenuous activities.  Below is a list of dressing supplies you might need.  Cotton-tipped applicators - Q-tips Gauze pads (2x2 and/or 4x4) - All-Purpose Sponges New and clean tube of petroleum jelly (Vaseline) OR prescription antibiotic ointment if prescribed Either a bandaid large enough to cover the entire wound OR non-stick dressing material (Telfa) and Tape (Paper or Hypafix)  FOR ADULT SURGERY PATIENTS: If you need something for pain relief, you may take 1 extra strength Tylenol (acetaminophen) and 2  ibuprofen (200 mg) together every 4 hours as needed. (Do not take these medications if you are allergic to them or if you know you cannot take them for any other reason). Typically you may only need pain medication for 1-3 days.   Comments on the Post-Operative Period Slight swelling and redness often appear around the wound. This is normal and will disappear within several days following the surgery. The healing wound will drain a brownish-red-yellow discharge during healing. This is a normal phase of wound healing. As the wound begins to heal, the drainage may increase in amount. Again, this drainage is normal. Notify us if the drainage becomes persistently bloody, excessively swollen, or intensely painful or develops a foul odor or red streaks.  The healing wound will also typically be itchy. This is normal. If you have severe or persistent pain, Notify us if the discomfort is severe or persistent. Avoid alcoholic beverages when taking pain medicine.  In Case of Wound Hemorrhage A wound hemorrhage is when the bandage suddenly becomes soaked with bright red blood and flows profusely. If this happens, sit down or lie down with your head elevated. If the wound has a dressing on it, do not remove the dressing. Apply pressure to the existing gauze. If the wound is not covered, use a gauze pad to apply pressure and continue applying the pressure for 20 minutes without peeking. DO NOT COVER THE WOUND WITH A LARGE TOWEL OR WASH CLOTH. Release your hand from the   wound site but do not remove the dressing. If the bleeding has stopped, gently clean around the wound. Leave the dressing in place for 24 hours if possible. This wait time allows the blood vessels to close off so that you do not spark a new round of bleeding by disrupting the newly clotted blood vessels with an immediate dressing change. If the bleeding does not subside, continue to hold pressure for 40 minutes. If bleeding continues, page your  physician, contact an After Hours clinic or go to the Emergency Room.   Due to recent changes in healthcare laws, you may see results of your pathology and/or laboratory studies on MyChart before the doctors have had a chance to review them. We understand that in some cases there may be results that are confusing or concerning to you. Please understand that not all results are received at the same time and often the doctors may need to interpret multiple results in order to provide you with the best plan of care or course of treatment. Therefore, we ask that you please give us 2 business days to thoroughly review all your results before contacting the office for clarification. Should we see a critical lab result, you will be contacted sooner.   If You Need Anything After Your Visit  If you have any questions or concerns for your doctor, please call our main line at [phone] and press option 4 to reach your doctor's medical assistant. If no one answers, please leave a voicemail as directed and we will return your call as soon as possible. Messages left after 4 pm will be answered the following business day.   You may also send us a message via MyChart. We typically respond to MyChart messages within 1-2 business days.  For prescription refills, please ask your pharmacy to contact our office. Our fax number is [phone].  If you have an urgent issue when the clinic is closed that cannot wait until the next business day, you can page your doctor at the number below.    Please note that while we do our best to be available for urgent issues outside of office hours, we are not available 24/7.   If you have an urgent issue and are unable to reach us, you may choose to seek medical care at your doctor's office, retail clinic, urgent care center, or emergency room.  If you have a medical emergency, please immediately call 911 or go to the emergency department.  Pager Numbers  - Dr. Kowalski:  [phone]  - Dr. Moye: [phone]  - Dr. Stewart: [phone]  In the event of inclement weather, please call our main line at [phone] for an update on the status of any delays or closures.  Dermatology Medication Tips: Please keep the boxes that topical medications come in in order to help keep track of the instructions about where and how to use these. Pharmacies typically print the medication instructions only on the boxes and not directly on the medication tubes.   If your medication is too expensive, please contact our office at [phone] option 4 or send us a message through MyChart.   We are unable to tell what your co-pay for medications will be in advance as this is different depending on your insurance coverage. However, we may be able to find a substitute medication at lower cost or fill out paperwork to get insurance to cover a needed medication.   If a prior authorization is required to get your medication covered   by your insurance company, please allow us 1-2 business days to complete this process.  Drug prices often vary depending on where the prescription is filled and some pharmacies may offer cheaper prices.  The website www.goodrx.com contains coupons for medications through different pharmacies. The prices here do not account for what the cost may be with help from insurance (it may be cheaper with your insurance), but the website can give you the price if you did not use any insurance.  - You can print the associated coupon and take it with your prescription to the pharmacy.  - You may also stop by our office during regular business hours and pick up a GoodRx coupon card.  - If you need your prescription sent electronically to a different pharmacy, notify our office through [place] MyChart or by phone at [phone] option 4.     Si Usted Necesita Algo Despus de Su Visita  Tambin puede enviarnos un mensaje a travs de MyChart. Por lo general  respondemos a los mensajes de MyChart en el transcurso de 1 a 2 das hbiles.  Para renovar recetas, por favor pida a su farmacia que se ponga en contacto con nuestra oficina. Nuestro nmero de fax es el [phone].  Si tiene un asunto urgente cuando la clnica est cerrada y que no puede esperar hasta el siguiente da hbil, puede llamar/localizar a su doctor(a) al nmero que aparece a continuacin.   Por favor, tenga en cuenta que aunque hacemos todo lo posible para estar disponibles para asuntos urgentes fuera del horario de oficina, no estamos disponibles las 24 horas del da, los 7 das de la semana.   Si tiene un problema urgente y no puede comunicarse con nosotros, puede optar por buscar atencin mdica  en el consultorio de su doctor(a), en una clnica privada, en un centro de atencin urgente o en una sala de emergencias.  Si tiene una emergencia mdica, por favor llame inmediatamente al 911 o vaya a la sala de emergencias.  Nmeros de bper  - Dr. Kowalski: [phone]  - Dra. Moye: [phone]  - Dra. Stewart: [phone]  En caso de inclemencias del tiempo, por favor llame a nuestra lnea principal al [phone] para una actualizacin sobre el estado de cualquier retraso o cierre.  Consejos para la medicacin en dermatologa: Por favor, guarde las cajas en las que vienen los medicamentos de uso tpico para ayudarle a seguir las instrucciones sobre dnde y cmo usarlos. Las farmacias generalmente imprimen las instrucciones del medicamento slo en las cajas y no directamente en los tubos del medicamento.   Si su medicamento es muy caro, por favor, pngase en contacto con nuestra oficina llamando al [phone] y presione la opcin 4 o envenos un mensaje a travs de MyChart.   No podemos decirle cul ser su copago por los medicamentos por adelantado ya que esto es diferente dependiendo de la cobertura de su seguro. Sin embargo, es posible que podamos encontrar un  medicamento sustituto a menor costo o llenar un formulario para que el seguro cubra el medicamento que se considera necesario.   Si se requiere una autorizacin previa para que su compaa de seguros cubra su medicamento, por favor permtanos de 1 a 2 das hbiles para completar este proceso.  Los precios de los medicamentos varan con frecuencia dependiendo del lugar de dnde se surte la receta y alguna farmacias pueden ofrecer precios ms baratos.  El sitio web www.goodrx.com tiene cupones para medicamentos de diferentes farmacias. Los precios aqu no   tienen en cuenta lo que podra costar con la ayuda del seguro (puede ser ms barato con su seguro), pero el sitio web puede darle el precio si no utiliz ningn seguro.  - Puede imprimir el cupn correspondiente y llevarlo con su receta a la farmacia.  - Tambin puede pasar por nuestra oficina durante el horario de atencin regular y recoger una tarjeta de cupones de GoodRx.  - Si necesita que su receta se enve electrnicamente a una farmacia diferente, informe a nuestra oficina a travs de MyChart de [place] o por telfono llamando al [phone] y presione la opcin 4.   

## 2022-07-20 NOTE — Progress Notes (Unsigned)
   Follow-Up Visit   Subjective  Joy Houston is a 57 y.o. female who presents for the following: Excision of cyst at left labia majora  The following portions of the chart were reviewed this encounter and updated as appropriate: medications, allergies, medical history  Review of Systems:  No other skin or systemic complaints except as noted in HPI or Assessment and Plan.  Objective  Well appearing patient in no apparent distress; mood and affect are within normal limits.  A focused examination was performed of the following areas: Vaginal area Relevant physical exam findings are noted in the Assessment and Plan.   left labia majora Cluster of white papules, 0.8 cm subcutaneous white papule, 2 0.2 cm white papules, total diameter 1.1 cm    Assessment & Plan   Neoplasm of skin left labia majora  Skin excision  Lesion length (cm):  1.1 Lesion width (cm):  1 Margin per side (cm):  0 Total excision diameter (cm):  1.1 Informed consent: discussed and consent obtained   Timeout: patient name, date of birth, surgical site, and procedure verified   Procedure prep:  Patient was prepped and draped in usual sterile fashion Prep type:  Chlorhexidine Anesthesia: the lesion was anesthetized in a standard fashion   Anesthetic:  1% lidocaine w/ epinephrine 1-100,000 buffered w/ 8.4% NaHCO3 (3 cc lido w/epi) Instrument used comment:  15c Hemostasis achieved with: pressure and electrodesiccation    Skin repair Complexity:  Intermediate Final length (cm):  1.4 Informed consent: discussed and consent obtained   Timeout: patient name, date of birth, surgical site, and procedure verified   Procedure prep:  Patient was prepped and draped in usual sterile fashion Prep type:  Chlorhexidine Anesthesia: the lesion was anesthetized in a standard fashion   Anesthetic:  1% lidocaine w/ epinephrine 1-100,000 local infiltration Reason for type of repair: reduce tension to allow closure,  reduce the risk of dehiscence, infection, and necrosis and enhance both functionality and cosmetic results   Undermining: edges could be approximated without difficulty   Subcutaneous layers (deep stitches):  Suture size:  4-0 Suture type: Vicryl (polyglactin 910)   Stitches:  Buried vertical mattress Fine/surface layer approximation (top stitches):  Suture size:  4-0 Suture type: fast-absorbing plain gut   Hemostasis achieved with: suture, pressure and electrodesiccation Outcome: patient tolerated procedure well with no complications   Post-procedure details: wound care instructions given   Additional details:  Mupirocin and a pressure dressing applied  Specimen 2 - Surgical pathology Differential Diagnosis: Cyst vs Other  Check Margins: No 0.8 cm subcutaneous white papule     Return if symptoms worsen or fail to improve.  Anise Salvo, RMA, am acting as scribe for Darden Dates, MD .   Documentation: I have reviewed the above documentation for accuracy and completeness, and I agree with the above.  Darden Dates, MD

## 2022-07-21 MED ORDER — HYDRALAZINE HCL 25 MG PO TABS: 25.0000 mg | ORAL_TABLET | Freq: Three times a day (TID) | ORAL | 0 refills | Status: DC

## 2022-07-23 DIAGNOSIS — E119 Type 2 diabetes mellitus without complications: Secondary | ICD-10-CM | POA: Diagnosis not present

## 2022-07-25 DIAGNOSIS — J301 Allergic rhinitis due to pollen: Secondary | ICD-10-CM | POA: Diagnosis not present

## 2022-07-25 DIAGNOSIS — J3489 Other specified disorders of nose and nasal sinuses: Secondary | ICD-10-CM | POA: Diagnosis not present

## 2022-07-25 DIAGNOSIS — J330 Polyp of nasal cavity: Secondary | ICD-10-CM | POA: Diagnosis not present

## 2022-07-26 ENCOUNTER — Telehealth: Payer: Self-pay

## 2022-07-26 NOTE — Telephone Encounter (Signed)
-----   Message from Sandi Mealy, MD sent at 07/26/2022  1:57 PM EDT ----- Skin (M), left labia majora EPIDERMAL INCLUSION CYST--> benign cyst no additional treatment needed  MAs please call. Thank you!

## 2022-07-26 NOTE — Telephone Encounter (Signed)
LVM for patient advising pathology showed benign cyst. Butch Penny., RMA

## 2022-07-29 ENCOUNTER — Ambulatory Visit (INDEPENDENT_AMBULATORY_CARE_PROVIDER_SITE_OTHER): Payer: BC Managed Care – PPO | Admitting: Internal Medicine

## 2022-07-29 ENCOUNTER — Encounter: Payer: Self-pay | Admitting: Internal Medicine

## 2022-07-29 VITALS — BP 154/94 | HR 75 | Temp 98.0°F | Resp 16 | Ht 66.0 in | Wt 245.0 lb

## 2022-07-29 DIAGNOSIS — I480 Paroxysmal atrial fibrillation: Secondary | ICD-10-CM

## 2022-07-29 DIAGNOSIS — R945 Abnormal results of liver function studies: Secondary | ICD-10-CM

## 2022-07-29 DIAGNOSIS — K760 Fatty (change of) liver, not elsewhere classified: Secondary | ICD-10-CM | POA: Diagnosis not present

## 2022-07-29 DIAGNOSIS — R197 Diarrhea, unspecified: Secondary | ICD-10-CM

## 2022-07-29 DIAGNOSIS — I10 Essential (primary) hypertension: Secondary | ICD-10-CM

## 2022-07-29 DIAGNOSIS — R32 Unspecified urinary incontinence: Secondary | ICD-10-CM | POA: Diagnosis not present

## 2022-07-29 DIAGNOSIS — E782 Mixed hyperlipidemia: Secondary | ICD-10-CM

## 2022-07-29 DIAGNOSIS — E1165 Type 2 diabetes mellitus with hyperglycemia: Secondary | ICD-10-CM

## 2022-07-29 DIAGNOSIS — G4733 Obstructive sleep apnea (adult) (pediatric): Secondary | ICD-10-CM

## 2022-07-29 MED ORDER — CARVEDILOL 6.25 MG PO TABS: 6.2500 mg | ORAL_TABLET | Freq: Two times a day (BID) | ORAL | 1 refills | Status: DC

## 2022-07-29 MED ORDER — HYDRALAZINE HCL 25 MG PO TABS: 25.0000 mg | ORAL_TABLET | Freq: Three times a day (TID) | ORAL | 1 refills | Status: DC

## 2022-07-29 NOTE — Progress Notes (Unsigned)
Subjective:    Patient ID: Joy Houston, female    DOB: 29-Sep-1965, 57 y.o.   MRN: [id number]  Patient here for  Chief Complaint  Patient presents with   Medical Management of Chronic Issues    HPI Here to follow up regarding hypertension, diabetes and hypercholesterolemia.  Blood pressure has remained elevated. Currently on hydralazine - 25mg  tid and carvedilol 6.25mg  bid. Was on hctz when had angioedema. Intolerance to amlodipine. Tolerating current medications.  Blood pressures averaging 130-140/mid to upper 80s.  Decreased palpitations.  No chest pain reported.  Breathing stable.  Plans to start walking more.  Has had issues with bladder incontinence.  Discussed PFT.    Past Medical History:  Diagnosis Date   Anemia    Angio-edema    Atrial fibrillation (HCC)    Complication of anesthesia    Diabetes mellitus without complication (HCC)    diet controlled   Dysplastic nevus 06/27/2018   Left upper back. Moderate atypia, limited margins free.    Eczema    Epstein Barr infection    Family history of anesthesia complication    Mother - confusion   Fatty liver    Heart murmur    Slight - "nothing to worry about"   Hemorrhoid    Hypertension    Hypothyroidism    PONV (postoperative nausea and vomiting)    "only with c-sections"   Sleep apnea    mild   Past Surgical History:  Procedure Laterality Date   CESAREAN SECTION     CHOLECYSTECTOMY N/A 09/07/2018   Procedure: LAPAROSCOPIC CHOLECYSTECTOMY;  Surgeon: Carolan Shiver, MD;  Location: [facility] ORS;  Service: General;  Laterality: N/A;   FINGER SURGERY     pin to 3rd finger Right hand   LUMBAR LAMINECTOMY/DECOMPRESSION MICRODISCECTOMY  12/15/2011   Procedure: LUMBAR LAMINECTOMY/DECOMPRESSION MICRODISCECTOMY 1 LEVEL;  Surgeon: Cristi Loron, MD;  Location: [facility];  Service: Neurosurgery;  Laterality: Right;  RIGHT Lumbar four-Five diskectomy   LUMBAR LAMINECTOMY/DECOMPRESSION MICRODISCECTOMY N/A  11/04/2019   Procedure: MICRODISCECTOMY LEFT LUMBAR FIVE SACRAL ONE;  Surgeon: Tressie Stalker, MD;  Location: [facility] OR;  Service: Neurosurgery;  Laterality: N/A;  3C   MICRODISCECTOMY LUMBAR     L4-5   WISDOM TOOTH EXTRACTION     Family History  Problem Relation Age of Onset   GER disease Mother    Diabetes Father    Cancer Father        Melanoma, Prostate   Heart disease Father    Heart disease Maternal Grandfather    Diabetes Paternal Grandfather    Breast cancer Sister 68   Social History   Socioeconomic History   Marital status: Married    Spouse name: Not on file   Number of children: 2   Years of education: Not on file   Highest education level: Bachelor's degree (e.g., BA, AB, BS)  Occupational History    Employer: [employer]  Tobacco Use   Smoking status: Never   Smokeless tobacco: Never  Vaping Use   Vaping Use: Never used  Substance and Sexual Activity   Alcohol use: Not Currently    Alcohol/week: 0.0 standard drinks of alcohol   Drug use: No   Sexual activity: Not on file  Other Topics Concern   Not on file  Social History Narrative   Nurse    Married   Social Determinants of Health   Financial Resource Strain: Low Risk  (07/28/2022)   Overall Financial Resource Strain (CARDIA)  Difficulty of Paying Living Expenses: Not hard at all  Food Insecurity: No Food Insecurity (07/28/2022)   Hunger Vital Sign    Worried About Running Out of Food in the Last Year: Never true    Ran Out of Food in the Last Year: Never true  Transportation Needs: No Transportation Needs (07/28/2022)   PRAPARE - Administrator, Civil Service (Medical): No    Lack of Transportation (Non-Medical): No  Physical Activity: Unknown (07/28/2022)   Exercise Vital Sign    Days of Exercise per Week: Patient declined    Minutes of Exercise per Session: Not on file  Stress: Patient Declined (07/28/2022)   [employer] of Occupational Health - Occupational Stress  Questionnaire    Feeling of Stress : Patient declined  Social Connections: Socially Integrated (07/28/2022)   Social Connection and Isolation Panel [NHANES]    Frequency of Communication with Friends and Family: More than three times a week    Frequency of Social Gatherings with Friends and Family: More than three times a week    Attends Religious Services: More than 4 times per year    Active Member of [employer] or Organizations: Yes    Attends Engineer, structural: More than 4 times per year    Marital Status: Married     Review of Systems     Objective:     BP (!) 150/90   Pulse 75   Temp 98 F (36.7 C)   Resp 16   Ht 5\' 6"  (1.676 m)   Wt 245 lb (111.1 kg)   SpO2 98%   BMI 39.54 kg/m  Wt Readings from Last 3 Encounters:  07/29/22 245 lb (111.1 kg)  07/15/22 230 lb (104.3 kg)  06/09/22 239 lb 8 oz (108.6 kg)    Physical Exam   Outpatient Encounter Medications as of 07/29/2022  Medication Sig   carvedilol (COREG) 6.25 MG tablet Take 1 tablet (6.25 mg total) by mouth 2 (two) times daily with a meal.   EPINEPHrine (EPIPEN 2-PAK) 0.3 mg/0.3 mL IJ SOAJ injection Inject 0.3 mg into the muscle as needed for anaphylaxis.   hydrALAZINE (APRESOLINE) 25 MG tablet Take 1 tablet (25 mg total) by mouth 3 (three) times daily.   hydrocortisone (ANUSOL-HC) 25 MG suppository Place 1 suppository (25 mg total) rectally 2 (two) times daily as needed for hemorrhoids or anal itching.   hydrocortisone 2.5 % ointment Apply to areas of rash twice daily after ketoconazole cream   ibuprofen (ADVIL) 800 MG tablet 1 po q 6-8 hours prn. Caution stomach ulcers. Remain upright for 15 min after taking.   ketoconazole (NIZORAL) 2 % cream Apply to areas of rash twice daily as needed.   mupirocin ointment (BACTROBAN) 2 % Apply 1 Application topically daily.   NALTREXONE HCL PO Take 2 mg by mouth daily.   Syringe/Needle, Disp, (SYRINGE 3CC/25GX1") 25G X 1" 3 ML MISC Use as directed with b12 injections.    VITAMIN D PO Take by mouth.   [DISCONTINUED] carvedilol (COREG) 6.25 MG tablet Take 1 tablet (6.25 mg total) by mouth 2 (two) times daily with a meal.   [DISCONTINUED] hydrALAZINE (APRESOLINE) 25 MG tablet Take 1 tablet (25 mg total) by mouth 3 (three) times daily.   No facility-administered encounter medications on file as of 07/29/2022.     Lab Results  Component Value Date   WBC 7.3 02/22/2022   HGB 14.5 02/22/2022   HCT 42.4 02/22/2022   PLT 206.0 02/22/2022  GLUCOSE 114 (H) 02/22/2022   CHOL 199 02/22/2022   TRIG 88.0 02/22/2022   HDL 65.30 02/22/2022   LDLDIRECT 145.4 03/01/2013   LDLCALC 116 (H) 02/22/2022   ALT 34 02/22/2022   AST 23 02/22/2022   NA 140 02/22/2022   K 4.2 02/22/2022   CL 106 02/22/2022   CREATININE 0.63 02/22/2022   BUN 11 02/22/2022   CO2 24 02/22/2022   TSH 4.88 02/22/2022   HGBA1C 5.8 02/22/2022   MICROALBUR 1.7 02/24/2022    No results found.     Assessment & Plan:  Urinary incontinence, unspecified type  Other orders -     Carvedilol; Take 1 tablet (6.25 mg total) by mouth 2 (two) times daily with a meal.  Dispense: 180 tablet; Refill: 1 -     hydrALAZINE HCl; Take 1 tablet (25 mg total) by mouth 3 (three) times daily.  Dispense: 270 tablet; Refill: 1     Dale Gratis, MD

## 2022-07-30 ENCOUNTER — Encounter: Payer: Self-pay | Admitting: Internal Medicine

## 2022-07-30 NOTE — Assessment & Plan Note (Signed)
Diet and exercise.  Follow liver panel.   

## 2022-07-30 NOTE — Assessment & Plan Note (Addendum)
Has oral device. Needs to use regularly.  Follow. Prefers not to use cpap.

## 2022-07-30 NOTE — Assessment & Plan Note (Signed)
Continue low carb diet and exercise.  Follow met b and a1c.   Lab Results  Component Value Date   HGBA1C 5.8 02/22/2022   

## 2022-07-30 NOTE — Assessment & Plan Note (Signed)
Previous stool studies negative.  She has had persistent problems with diarrhea. (Ongoing for years). Symptoms seem to have been more noticeable after cholecystectomy 08/2018.  Work up to date has included - TTgIGA negative.  Lipase wnl.  CT abd/pelvis - diverticulosis, otherwise negative. Colonoscopy negative for microscopic colitis.  TSH/T4 negative.  No weight loss.  Eating.  No nausea or vomiting.   Have discussed history of loose stools.  Declines cholestyramine.

## 2022-07-30 NOTE — Assessment & Plan Note (Signed)
Previously reported on ultrasound 2020.  Recent liver panel wnl.  

## 2022-07-30 NOTE — Assessment & Plan Note (Signed)
Low cholesterol diet and exercise.  Follow lipid panel.   

## 2022-07-30 NOTE — Assessment & Plan Note (Signed)
Blood pressure remains elevated. Currently on carvedilol and hydralazine.  Taking hydralazine 25mg  tid. Blood pressure elevated today.  Still elevated above goal.  Have discussed treatment options.  Allegic to ACE inhibitor.  Was on hctz when had angioedema.  Intolerance to amlodipine.  Discussed increasing carvedilol.  She declines at this time.  Scheduled to see cardiology Monday.  Wants to see their recommendations.  Follow pressures. Follow metabolic panel.

## 2022-07-30 NOTE — Assessment & Plan Note (Signed)
Reports issues with her bladder as outlined.  Discussed referral for PFT.  Agreeable.

## 2022-07-30 NOTE — Assessment & Plan Note (Signed)
S/p ablation.  Off xarelto.  Afib/symptoms improved overall.  Since being on carvedilol, feels palpitations symptoms have improved. Follow pressures. Has been followed by cardiology - Dr Gwen Pounds.  He has moved. Recommend f/u with cardiology. Referral placed to establish care with new cardiologist.  She has appt next week.

## 2022-08-01 ENCOUNTER — Ambulatory Visit: Payer: BC Managed Care – PPO | Attending: Internal Medicine | Admitting: Internal Medicine

## 2022-08-01 ENCOUNTER — Encounter: Payer: Self-pay | Admitting: Internal Medicine

## 2022-08-01 VITALS — BP 168/98 | HR 81 | Ht 66.0 in | Wt 245.8 lb

## 2022-08-01 DIAGNOSIS — I10 Essential (primary) hypertension: Secondary | ICD-10-CM | POA: Diagnosis not present

## 2022-08-01 DIAGNOSIS — R0609 Other forms of dyspnea: Secondary | ICD-10-CM | POA: Diagnosis not present

## 2022-08-01 DIAGNOSIS — G4733 Obstructive sleep apnea (adult) (pediatric): Secondary | ICD-10-CM

## 2022-08-01 DIAGNOSIS — I480 Paroxysmal atrial fibrillation: Secondary | ICD-10-CM | POA: Diagnosis not present

## 2022-08-01 DIAGNOSIS — Z79899 Other long term (current) drug therapy: Secondary | ICD-10-CM

## 2022-08-01 MED ORDER — CARVEDILOL 6.25 MG PO TABS: 9.3750 mg | ORAL_TABLET | Freq: Two times a day (BID) | ORAL | 0 refills | Status: DC

## 2022-08-01 NOTE — Progress Notes (Signed)
Cardiology Office Note:    Date:  08/01/2022   ID:  Joy Houston, DOB [birth year], MRN [id number]  PCP:  Dale [place], MD  Cardiologist:  Parke Poisson, MD  Electrophysiologist:  None   Referring MD: Dale [place], MD   Chief Complaint/Reason for Referral: Afib, HTN  History of Present Illness:    Joy Houston is a 57 y.o. female with a history of anemia, diabetes/hx of gestational diabetes, HTN, murmur, afib, mild sleep apnea, presents to establish care and evaluate afib and HTN.   History of paroxysmal nonvalvular atrial fibrillation with prior ablation Dec 21.  Last seen by [facility] clinic 01/24/2022, no recurrent episodes, treated with rate controlling agent of metoprolol, then now carvedilol.  History of hypertension and sleep apnea, not on anticoagulation for a CHA2DS2-VASc score of 1.  EKG from 01/24/2022 reviewed sinus rhythm, LVH, nonspecific T wave abnormality.  Echocardiogram performed at OSH most recently October 16, 2019, ejection fraction 55%, grossly normal valves.  HTN on hydralazine 25mg  TID and carvedilol 6.25 mg BID. HCTZ and ACE-I stopped, had angioedema. Did not tolerate amlodipine (itching).   Overall she notes that with the recent addition of carvedilol she has felt better and her palpitations and exertional dyspnea have improved.  She does however continue to struggle with elevated blood pressure readings and at home her blood pressure is anywhere in the systolic range of 130-140 over approximately 80 diastolic.  She endorses a component of whitecoat hypertension with an elevated blood pressure of 168/98 today.  She plans to travel soon to the barrier [place] and if we make dose adjustments she would like to do this after her trip.  We reviewed prior records of which I reviewed extensive outside documentation for this consultation.  She adds to the documentation that her father had congestive heart failure at age 37, and passed at age 47.  Her mother is  27 and also has atrial fibrillation.  She deals with MTHFR which makes it challenging for her to take many medications and medication classes without side effects.  She is leery of starting diuretics given her concerns for metabolic issues.  We did discuss considering spironolactone, but with its diuretic component she was less in favor.  Denies exertional chest discomfort.  Does endorse exertional shortness of breath which has improved with beta-blockade.  Denies syncope or presyncope.  Past Medical History:  Diagnosis Date   Anemia    Angio-edema    Atrial fibrillation (HCC)    Complication of anesthesia    Diabetes mellitus without complication (HCC)    diet controlled   Dysplastic nevus 06/27/2018   Left upper back. Moderate atypia, limited margins free.    Eczema    Epstein Barr infection    Family history of anesthesia complication    Mother - confusion   Fatty liver    Heart murmur    Slight - "nothing to worry about"   Hemorrhoid    Hypertension    Hypothyroidism    PONV (postoperative nausea and vomiting)    "only with c-sections"   Sleep apnea    mild    Past Surgical History:  Procedure Laterality Date   CESAREAN SECTION     CHOLECYSTECTOMY N/A 09/07/2018   Procedure: LAPAROSCOPIC CHOLECYSTECTOMY;  Surgeon: Carolan Shiver, MD;  Location: [facility] ORS;  Service: General;  Laterality: N/A;   FINGER SURGERY     pin to 3rd finger Right hand   LUMBAR LAMINECTOMY/DECOMPRESSION MICRODISCECTOMY  12/15/2011   Procedure: LUMBAR  LAMINECTOMY/DECOMPRESSION MICRODISCECTOMY 1 LEVEL;  Surgeon: Cristi Loron, MD;  Location: [facility];  Service: Neurosurgery;  Laterality: Right;  RIGHT Lumbar four-Five diskectomy   LUMBAR LAMINECTOMY/DECOMPRESSION MICRODISCECTOMY N/A 11/04/2019   Procedure: MICRODISCECTOMY LEFT LUMBAR FIVE SACRAL ONE;  Surgeon: Tressie Stalker, MD;  Location: [facility] OR;  Service: Neurosurgery;  Laterality: N/A;  3C   MICRODISCECTOMY LUMBAR     L4-5   WISDOM  TOOTH EXTRACTION      Current Medications: Current Meds  Medication Sig   EPINEPHrine (EPIPEN 2-PAK) 0.3 mg/0.3 mL IJ SOAJ injection Inject 0.3 mg into the muscle as needed for anaphylaxis.   hydrALAZINE (APRESOLINE) 25 MG tablet Take 1 tablet (25 mg total) by mouth 3 (three) times daily.   NALTREXONE HCL PO Take 2 mg by mouth daily.   VITAMIN D PO Take by mouth.   [DISCONTINUED] carvedilol (COREG) 6.25 MG tablet Take 1 tablet (6.25 mg total) by mouth 2 (two) times daily with a meal.     Allergies:   Angiotensin receptor blockers, Lisinopril, Amlodipine besylate, Gluten meal, and Morphine and codeine   Social History   Tobacco Use   Smoking status: Never   Smokeless tobacco: Never  Vaping Use   Vaping Use: Never used  Substance Use Topics   Alcohol use: Not Currently    Alcohol/week: 0.0 standard drinks of alcohol   Drug use: No     Family History: The patient's family history includes Breast cancer (age of onset: 24) in her sister; Cancer in her father; Diabetes in her father and paternal grandfather; GER disease in her mother; Heart disease in her father and maternal grandfather.  ROS:   Please see the history of present illness.    All other systems reviewed and are negative.  EKGs/Labs/Other Studies Reviewed:    The following studies were reviewed today:  EKG:  NSR, LVH  Imaging studies that I have independently reviewed today: n/a  Recent Labs: 09/27/2021: Magnesium 1.9 02/22/2022: ALT 34; BUN 11; Creatinine, Ser 0.63; Hemoglobin 14.5; Platelets 206.0; Potassium 4.2; Sodium 140; TSH 4.88  Recent Lipid Panel    Component Value Date/Time   CHOL 199 02/22/2022 0800   TRIG 88.0 02/22/2022 0800   HDL 65.30 02/22/2022 0800   CHOLHDL 3 02/22/2022 0800   VLDL 17.6 02/22/2022 0800   LDLCALC 116 (H) 02/22/2022 0800   LDLDIRECT 145.4 03/01/2013 0913    Physical Exam:    VS:  BP (!) 168/98 (BP Location: Left Arm, Patient Position: Sitting, Cuff Size: Large)   Pulse  81   Ht 5\' 6"  (1.676 m)   Wt 245 lb 12.8 oz (111.5 kg)   SpO2 98%   BMI 39.67 kg/m     Wt Readings from Last 5 Encounters:  08/01/22 245 lb 12.8 oz (111.5 kg)  07/29/22 245 lb (111.1 kg)  07/15/22 230 lb (104.3 kg)  06/09/22 239 lb 8 oz (108.6 kg)  05/11/22 234 lb (106.1 kg)    Constitutional: No acute distress Eyes: sclera non-icteric, normal conjunctiva and lids ENMT: normal dentition, moist mucous membranes Cardiovascular: regular rhythm, normal rate, faint systolic murmur. S1 and S2 normal. No jugular venous distention.  Respiratory: clear to auscultation bilaterally GI : normal bowel sounds, soft and nontender. No distention.   MSK: extremities warm, well perfused. No edema.  NEURO: grossly nonfocal exam, moves all extremities. PSYCH: alert and oriented x 3, normal mood and affect.   ASSESSMENT:    1. Paroxysmal A-fib (HCC)   2. Primary hypertension  3. OSA (obstructive sleep apnea)   4. DOE (dyspnea on exertion)   5. Medication management    PLAN:    Paroxysmal A-fib (HCC) - Plan: EKG 12-Lead, ECHOCARDIOGRAM COMPLETE  Primary hypertension - Plan: EKG 12-Lead, ECHOCARDIOGRAM COMPLETE  OSA (obstructive sleep apnea) - Plan: EKG 12-Lead, ECHOCARDIOGRAM COMPLETE  DOE (dyspnea on exertion)  Medication management  She has been primarily dealing with blood pressure control with her primary doctor and her functional medicine practitioner.  She currently takes hydralazine 25 mg 3 times daily and carvedilol 6.25 mg twice daily.  She would like to titrate these medications preferentially given her hesitance to initiate new medications given concerns for side effects.  We participated in shared decision making and determined that we will increase her carvedilol to 6.25 mg tablets 1-1/2 tabs twice daily.  If she tolerates this well after she takes her trip, we can consider further dose up titration based on home blood pressure log.  Office blood pressure seems spuriously high.   We will also obtain an echocardiogram given findings of LVH on EKG and dyspnea on exertion which may represent diastolic dysfunction.  We will discuss medication therapy and echocardiogram results at our follow-up.   Weston Brass, MD, [facility]  [facility]   Shared Decision Making/Informed Consent:       Medication Adjustments/Labs and Tests Ordered: Current medicines are reviewed at length with the patient today.  Concerns regarding medicines are outlined above.   Orders Placed This Encounter  Procedures   EKG 12-Lead   ECHOCARDIOGRAM COMPLETE    Meds ordered this encounter  Medications   carvedilol (COREG) 6.25 MG tablet    Sig: Take 1.5 tablets (9.375 mg total) by mouth 2 (two) times daily with a meal.    Dispense:  270 tablet    Refill:  0    Patient Instructions  Medication Instructions:   INCREASE CARVEDILOL(6.25mg  TABLET)  TO 1 and 1/2 TABLETS TWICE DAILY   *If you need a refill on your cardiac medications before your next appointment, please call your pharmacy*  Lab Work: None Ordered At This Time.   If you have labs (blood work) drawn today and your tests are completely normal, you will receive your results only by: MyChart Message (if you have MyChart) OR A paper copy in the mail If you have any lab test that is abnormal or we need to change your treatment, we will call you to review the results.  Testing/Procedures: .Your physician has requested that you have an echocardiogram. Echocardiography is a painless test that uses sound waves to create images of your heart. It provides your doctor with information about the size and shape of your heart and how well your heart's chambers and valves are working. You may receive an ultrasound enhancing agent through an IV if needed to better visualize your heart during the echo.This procedure takes approximately one hour. There are no restrictions for this procedure. This will take place at the [address], [address].    Follow-Up: At [facility], you and your health needs are our priority.  As part of our continuing mission to provide you with exceptional heart care, we have created designated Provider Care Teams.  These Care Teams include your primary Cardiologist (physician) and Advanced Practice Providers (APPs -  Physician Assistants and Nurse Practitioners) who all work together to provide you with the care you need, when you need it.  Your next appointment:   6 WEEKS/  AFTER ECHO IS COMPLETED   Provider:   Parke Poisson, MD

## 2022-08-01 NOTE — Patient Instructions (Signed)
Medication Instructions:   INCREASE CARVEDILOL(6.25mg  TABLET)  TO 1 and 1/2 TABLETS TWICE DAILY   *If you need a refill on your cardiac medications before your next appointment, please call your pharmacy*  Lab Work: None Ordered At This Time.   If you have labs (blood work) drawn today and your tests are completely normal, you will receive your results only by: MyChart Message (if you have MyChart) OR A paper copy in the mail If you have any lab test that is abnormal or we need to change your treatment, we will call you to review the results.  Testing/Procedures: .Your physician has requested that you have an echocardiogram. Echocardiography is a painless test that uses sound waves to create images of your heart. It provides your doctor with information about the size and shape of your heart and how well your heart's chambers and valves are working. You may receive an ultrasound enhancing agent through an IV if needed to better visualize your heart during the echo.This procedure takes approximately one hour. There are no restrictions for this procedure. This will take place at the [address], [address].    Follow-Up: At [facility], you and your health needs are our priority.  As part of our continuing mission to provide you with exceptional heart care, we have created designated Provider Care Teams.  These Care Teams include your primary Cardiologist (physician) and Advanced Practice Providers (APPs -  Physician Assistants and Nurse Practitioners) who all work together to provide you with the care you need, when you need it.  Your next appointment:   6 WEEKS/ AFTER ECHO IS COMPLETED   Provider:   Parke Poisson, MD

## 2022-08-22 DIAGNOSIS — E119 Type 2 diabetes mellitus without complications: Secondary | ICD-10-CM | POA: Diagnosis not present

## 2022-09-01 ENCOUNTER — Ambulatory Visit (INDEPENDENT_AMBULATORY_CARE_PROVIDER_SITE_OTHER): Payer: BC Managed Care – PPO | Admitting: Internal Medicine

## 2022-09-01 DIAGNOSIS — I10 Essential (primary) hypertension: Secondary | ICD-10-CM

## 2022-09-01 NOTE — Progress Notes (Deleted)
Subjective:    Patient ID: Joy Houston, female    DOB: 04-20-1965, 57 y.o.   MRN: [id number]  Patient here for No chief complaint on file.   HPI Here to follow up regarding hypertension, diabetes and hypercholesterolemia. Blood pressure has remained elevated. Established with a new cardiologist 08/01/22.  Carvedilol was increased to 1 1/2 tablets bid.  Recommended echo - scheduled for 09/05/22. Overdue mammogram.    Past Medical History:  Diagnosis Date   Anemia    Angio-edema    Atrial fibrillation (HCC)    Complication of anesthesia    Diabetes mellitus without complication (HCC)    diet controlled   Dysplastic nevus 06/27/2018   Left upper back. Moderate atypia, limited margins free.    Eczema    Epstein Barr infection    Family history of anesthesia complication    Mother - confusion   Fatty liver    Heart murmur    Slight - "nothing to worry about"   Hemorrhoid    Hypertension    Hypothyroidism    PONV (postoperative nausea and vomiting)    "only with c-sections"   Sleep apnea    mild   Past Surgical History:  Procedure Laterality Date   CESAREAN SECTION     CHOLECYSTECTOMY N/A 09/07/2018   Procedure: LAPAROSCOPIC CHOLECYSTECTOMY;  Surgeon: Carolan Shiver, MD;  Location: [facility] ORS;  Service: General;  Laterality: N/A;   FINGER SURGERY     pin to 3rd finger Right hand   LUMBAR LAMINECTOMY/DECOMPRESSION MICRODISCECTOMY  12/15/2011   Procedure: LUMBAR LAMINECTOMY/DECOMPRESSION MICRODISCECTOMY 1 LEVEL;  Surgeon: Cristi Loron, MD;  Location: [facility];  Service: Neurosurgery;  Laterality: Right;  RIGHT Lumbar four-Five diskectomy   LUMBAR LAMINECTOMY/DECOMPRESSION MICRODISCECTOMY N/A 11/04/2019   Procedure: MICRODISCECTOMY LEFT LUMBAR FIVE SACRAL ONE;  Surgeon: Tressie Stalker, MD;  Location: [facility] OR;  Service: Neurosurgery;  Laterality: N/A;  3C   MICRODISCECTOMY LUMBAR     L4-5   WISDOM TOOTH EXTRACTION     Family History  Problem Relation Age of  Onset   GER disease Mother    Diabetes Father    Cancer Father        Melanoma, Prostate   Heart disease Father    Heart disease Maternal Grandfather    Diabetes Paternal Grandfather    Breast cancer Sister 78   Social History   Socioeconomic History   Marital status: Married    Spouse name: Not on file   Number of children: 2   Years of education: Not on file   Highest education level: Bachelor's degree (e.g., BA, AB, BS)  Occupational History    Employer: [employer]  Tobacco Use   Smoking status: Never   Smokeless tobacco: Never  Vaping Use   Vaping status: Never Used  Substance and Sexual Activity   Alcohol use: Not Currently    Alcohol/week: 0.0 standard drinks of alcohol   Drug use: No   Sexual activity: Not on file  Other Topics Concern   Not on file  Social History Narrative   Nurse    Married   Social Determinants of Health   Financial Resource Strain: Low Risk  (07/28/2022)   Overall Financial Resource Strain (CARDIA)    Difficulty of Paying Living Expenses: Not hard at all  Food Insecurity: No Food Insecurity (07/28/2022)   Hunger Vital Sign    Worried About Running Out of Food in the Last Year: Never true    Ran Out of Food  in the Last Year: Never true  Transportation Needs: No Transportation Needs (07/28/2022)   PRAPARE - Administrator, Civil Service (Medical): No    Lack of Transportation (Non-Medical): No  Physical Activity: Unknown (07/28/2022)   Exercise Vital Sign    Days of Exercise per Week: Patient declined    Minutes of Exercise per Session: Not on file  Stress: Patient Declined (07/28/2022)   [employer] of Occupational Health - Occupational Stress Questionnaire    Feeling of Stress : Patient declined  Social Connections: Socially Integrated (07/28/2022)   Social Connection and Isolation Panel [NHANES]    Frequency of Communication with Friends and Family: More than three times a week    Frequency of Social Gatherings with  Friends and Family: More than three times a week    Attends Religious Services: More than 4 times per year    Active Member of [employer] or Organizations: Yes    Attends Engineer, structural: More than 4 times per year    Marital Status: Married     Review of Systems     Objective:     There were no vitals taken for this visit. Wt Readings from Last 3 Encounters:  08/01/22 245 lb 12.8 oz (111.5 kg)  07/29/22 245 lb (111.1 kg)  07/15/22 230 lb (104.3 kg)    Physical Exam   Outpatient Encounter Medications as of 09/01/2022  Medication Sig   carvedilol (COREG) 6.25 MG tablet Take 1.5 tablets (9.375 mg total) by mouth 2 (two) times daily with a meal.   EPINEPHrine (EPIPEN 2-PAK) 0.3 mg/0.3 mL IJ SOAJ injection Inject 0.3 mg into the muscle as needed for anaphylaxis.   hydrALAZINE (APRESOLINE) 25 MG tablet Take 1 tablet (25 mg total) by mouth 3 (three) times daily.   hydrocortisone (ANUSOL-HC) 25 MG suppository Place 1 suppository (25 mg total) rectally 2 (two) times daily as needed for hemorrhoids or anal itching. (Patient not taking: Reported on 08/01/2022)   hydrocortisone 2.5 % ointment Apply to areas of rash twice daily after ketoconazole cream (Patient not taking: Reported on 08/01/2022)   ibuprofen (ADVIL) 800 MG tablet 1 po q 6-8 hours prn. Caution stomach ulcers. Remain upright for 15 min after taking. (Patient not taking: Reported on 08/01/2022)   ketoconazole (NIZORAL) 2 % cream Apply to areas of rash twice daily as needed. (Patient not taking: Reported on 08/01/2022)   mupirocin ointment (BACTROBAN) 2 % Apply 1 Application topically daily. (Patient not taking: Reported on 08/01/2022)   NALTREXONE HCL PO Take 2 mg by mouth daily.   Syringe/Needle, Disp, (SYRINGE 3CC/25GX1") 25G X 1" 3 ML MISC Use as directed with b12 injections. (Patient not taking: Reported on 08/01/2022)   VITAMIN D PO Take by mouth.   No facility-administered encounter medications on file as of 09/01/2022.      Lab Results  Component Value Date   WBC 7.3 02/22/2022   HGB 14.5 02/22/2022   HCT 42.4 02/22/2022   PLT 206.0 02/22/2022   GLUCOSE 114 (H) 02/22/2022   CHOL 199 02/22/2022   TRIG 88.0 02/22/2022   HDL 65.30 02/22/2022   LDLDIRECT 145.4 03/01/2013   LDLCALC 116 (H) 02/22/2022   ALT 34 02/22/2022   AST 23 02/22/2022   NA 140 02/22/2022   K 4.2 02/22/2022   CL 106 02/22/2022   CREATININE 0.63 02/22/2022   BUN 11 02/22/2022   CO2 24 02/22/2022   TSH 4.88 02/22/2022   HGBA1C 5.8 02/22/2022   MICROALBUR  1.7 02/24/2022    No results found.     Assessment & Plan:  There are no diagnoses linked to this encounter.   Dale Winchester, MD

## 2022-09-02 ENCOUNTER — Encounter: Payer: Self-pay | Admitting: Internal Medicine

## 2022-09-05 ENCOUNTER — Telehealth: Payer: Self-pay | Admitting: Internal Medicine

## 2022-09-05 ENCOUNTER — Encounter: Payer: Self-pay | Admitting: Internal Medicine

## 2022-09-05 ENCOUNTER — Ambulatory Visit (HOSPITAL_COMMUNITY): Payer: BC Managed Care – PPO | Attending: Cardiology

## 2022-09-05 DIAGNOSIS — I480 Paroxysmal atrial fibrillation: Secondary | ICD-10-CM | POA: Diagnosis not present

## 2022-09-05 DIAGNOSIS — G4733 Obstructive sleep apnea (adult) (pediatric): Secondary | ICD-10-CM | POA: Insufficient documentation

## 2022-09-05 DIAGNOSIS — I10 Essential (primary) hypertension: Secondary | ICD-10-CM | POA: Insufficient documentation

## 2022-09-05 LAB — ECHOCARDIOGRAM COMPLETE
Area-P 1/2: 3.53 cm2
S' Lateral: 3.5 cm

## 2022-09-05 NOTE — Telephone Encounter (Signed)
Please call and confirm she is doing ok.  Any symptoms?

## 2022-09-05 NOTE — Telephone Encounter (Signed)
 See other note

## 2022-09-05 NOTE — Telephone Encounter (Signed)
Pt called in stating that she did not come in for her appt on 09/01/22 because she had Covid. 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 She asked when the next available opening I said 10/20/22, she stated that too far out and she wont be in town. Pt also stated that if Dr. Lorin Picket can work her in before then?

## 2022-09-05 NOTE — Progress Notes (Signed)
Patient ID: Joy Houston, female   DOB: 02/19/1966, 57 y.o.   MRN: [id number] Did not show for appt.

## 2022-09-05 NOTE — Telephone Encounter (Signed)
FYI I will reschedule her appt but sending this to you in case you have to document anything for no charge?

## 2022-09-06 NOTE — Telephone Encounter (Signed)
Thanks for update. Noted.

## 2022-09-06 NOTE — Telephone Encounter (Signed)
Patient says she is feeling much better. No acute symptoms. Some lingering congestion. She is already back at work. Her symptoms started Wednesday before last and then she tested positive last Monday. Will look at the schedule and get her rescheduled for her follow up with you

## 2022-09-22 DIAGNOSIS — E119 Type 2 diabetes mellitus without complications: Secondary | ICD-10-CM | POA: Diagnosis not present

## 2022-09-22 DIAGNOSIS — I10 Essential (primary) hypertension: Secondary | ICD-10-CM | POA: Diagnosis not present

## 2022-09-30 ENCOUNTER — Encounter: Payer: Self-pay | Admitting: Internal Medicine

## 2022-09-30 ENCOUNTER — Ambulatory Visit: Payer: BC Managed Care – PPO | Admitting: Internal Medicine

## 2022-09-30 VITALS — BP 138/90 | HR 79 | Temp 98.2°F | Resp 16 | Ht 66.0 in | Wt 248.0 lb

## 2022-09-30 DIAGNOSIS — I480 Paroxysmal atrial fibrillation: Secondary | ICD-10-CM

## 2022-09-30 DIAGNOSIS — I10 Essential (primary) hypertension: Secondary | ICD-10-CM

## 2022-09-30 DIAGNOSIS — E1165 Type 2 diabetes mellitus with hyperglycemia: Secondary | ICD-10-CM | POA: Diagnosis not present

## 2022-09-30 DIAGNOSIS — R197 Diarrhea, unspecified: Secondary | ICD-10-CM

## 2022-09-30 DIAGNOSIS — E782 Mixed hyperlipidemia: Secondary | ICD-10-CM

## 2022-09-30 DIAGNOSIS — R945 Abnormal results of liver function studies: Secondary | ICD-10-CM

## 2022-09-30 DIAGNOSIS — G4733 Obstructive sleep apnea (adult) (pediatric): Secondary | ICD-10-CM

## 2022-09-30 LAB — HM DIABETES FOOT EXAM

## 2022-09-30 NOTE — Progress Notes (Signed)
Subjective:    Patient ID: Joy Houston, female    DOB: 1965-07-20, 57 y.o.   MRN: [id number]  Patient here for  Chief Complaint  Patient presents with   Medical Management of Chronic Issues    HPI Here for a scheduled follow up - f/u regarding her blood pressure, blood sugar and loose stool.  Has had issues with persistent loose stool - more so since gallbladder removed.  Two days ago, had increased diarrhea.  Seem to last 24-48 hours.  Better now and getting more back to her normal - which is still increased loose stool.  Discussed colestid.  Wants to hold on starting.  Has had extensive GI w/up.  Overall appears to be doing better from cardiac standpoint.  On carvedilol.  Discussed increasing dose to 1and 1/2 tablet bid.  Magnesium helping cramps.  Breathing stable.  Walking.    Past Medical History:  Diagnosis Date   Anemia    Angio-edema    Atrial fibrillation (HCC)    Complication of anesthesia    Diabetes mellitus without complication (HCC)    diet controlled   Dysplastic nevus 06/27/2018   Left upper back. Moderate atypia, limited margins free.    Eczema    Epstein Barr infection    Family history of anesthesia complication    Mother - confusion   Fatty liver    Heart murmur    Slight - "nothing to worry about"   Hemorrhoid    Hypertension    Hypothyroidism    PONV (postoperative nausea and vomiting)    "only with c-sections"   Sleep apnea    mild   Past Surgical History:  Procedure Laterality Date   CESAREAN SECTION     CHOLECYSTECTOMY N/A 09/07/2018   Procedure: LAPAROSCOPIC CHOLECYSTECTOMY;  Surgeon: Carolan Shiver, MD;  Location: [facility] ORS;  Service: General;  Laterality: N/A;   FINGER SURGERY     pin to 3rd finger Right hand   LUMBAR LAMINECTOMY/DECOMPRESSION MICRODISCECTOMY  12/15/2011   Procedure: LUMBAR LAMINECTOMY/DECOMPRESSION MICRODISCECTOMY 1 LEVEL;  Surgeon: Cristi Loron, MD;  Location: [facility];  Service: Neurosurgery;   Laterality: Right;  RIGHT Lumbar four-Five diskectomy   LUMBAR LAMINECTOMY/DECOMPRESSION MICRODISCECTOMY N/A 11/04/2019   Procedure: MICRODISCECTOMY LEFT LUMBAR FIVE SACRAL ONE;  Surgeon: Tressie Stalker, MD;  Location: [facility] OR;  Service: Neurosurgery;  Laterality: N/A;  3C   MICRODISCECTOMY LUMBAR     L4-5   WISDOM TOOTH EXTRACTION     Family History  Problem Relation Age of Onset   GER disease Mother    Diabetes Father    Cancer Father        Melanoma, Prostate   Heart disease Father    Heart disease Maternal Grandfather    Diabetes Paternal Grandfather    Breast cancer Sister 66   Social History   Socioeconomic History   Marital status: Married    Spouse name: Not on file   Number of children: 2   Years of education: Not on file   Highest education level: Bachelor's degree (e.g., BA, AB, BS)  Occupational History    Employer: [employer]  Tobacco Use   Smoking status: Never   Smokeless tobacco: Never  Vaping Use   Vaping status: Never Used  Substance and Sexual Activity   Alcohol use: Not Currently    Alcohol/week: 0.0 standard drinks of alcohol   Drug use: No   Sexual activity: Not on file  Other Topics Concern   Not on file  Social History Narrative   Nurse    Married   Social Determinants of Health   Financial Resource Strain: Low Risk  (07/28/2022)   Overall Financial Resource Strain (CARDIA)    Difficulty of Paying Living Expenses: Not hard at all  Food Insecurity: No Food Insecurity (07/28/2022)   Hunger Vital Sign    Worried About Running Out of Food in the Last Year: Never true    Ran Out of Food in the Last Year: Never true  Transportation Needs: No Transportation Needs (07/28/2022)   PRAPARE - Administrator, Civil Service (Medical): No    Lack of Transportation (Non-Medical): No  Physical Activity: Unknown (07/28/2022)   Exercise Vital Sign    Days of Exercise per Week: Patient declined    Minutes of Exercise per Session: Not on file   Stress: Patient Declined (07/28/2022)   [employer] of Occupational Health - Occupational Stress Questionnaire    Feeling of Stress : Patient declined  Social Connections: Socially Integrated (07/28/2022)   Social Connection and Isolation Panel [NHANES]    Frequency of Communication with Friends and Family: More than three times a week    Frequency of Social Gatherings with Friends and Family: More than three times a week    Attends Religious Services: More than 4 times per year    Active Member of [employer] or Organizations: Yes    Attends Engineer, structural: More than 4 times per year    Marital Status: Married     Review of Systems  Constitutional:  Negative for appetite change and unexpected weight change.  HENT:  Negative for congestion and sinus pressure.   Respiratory:  Negative for cough, chest tightness and shortness of breath.   Cardiovascular:  Negative for chest pain and leg swelling.       No increased swelling.   Gastrointestinal:  Positive for diarrhea. Negative for nausea and vomiting.  Genitourinary:  Negative for difficulty urinating and dysuria.  Musculoskeletal:  Negative for joint swelling and myalgias.  Skin:  Negative for color change and rash.  Neurological:  Negative for dizziness and headaches.  Psychiatric/Behavioral:  Negative for agitation and dysphoric mood.        Objective:     BP (!) 138/90   Pulse 79   Temp 98.2 F (36.8 C)   Resp 16   Ht 5\' 6"  (1.676 m)   Wt 248 lb (112.5 kg)   SpO2 97%   BMI 40.03 kg/m  Wt Readings from Last 3 Encounters:  09/30/22 248 lb (112.5 kg)  08/01/22 245 lb 12.8 oz (111.5 kg)  07/29/22 245 lb (111.1 kg)    Physical Exam Vitals reviewed.  Constitutional:      General: She is not in acute distress.    Appearance: Normal appearance.  HENT:     Head: Normocephalic and atraumatic.     Right Ear: External ear normal.     Left Ear: External ear normal.  Eyes:     General: No scleral icterus.        Right eye: No discharge.        Left eye: No discharge.     Conjunctiva/sclera: Conjunctivae normal.  Neck:     Thyroid: No thyromegaly.  Cardiovascular:     Rate and Rhythm: Normal rate and regular rhythm.  Pulmonary:     Effort: No respiratory distress.     Breath sounds: Normal breath sounds. No wheezing.  Abdominal:     General: Bowel  sounds are normal.     Palpations: Abdomen is soft.     Tenderness: There is no abdominal tenderness.  Musculoskeletal:        General: No swelling or tenderness.     Cervical back: Neck supple. No tenderness.  Lymphadenopathy:     Cervical: No cervical adenopathy.  Skin:    Findings: No erythema or rash.  Neurological:     Mental Status: She is alert.  Psychiatric:        Mood and Affect: Mood normal.        Behavior: Behavior normal.      Outpatient Encounter Medications as of 09/30/2022  Medication Sig   carvedilol (COREG) 6.25 MG tablet Take 1.5 tablets (9.375 mg total) by mouth 2 (two) times daily with a meal.   EPINEPHrine (EPIPEN 2-PAK) 0.3 mg/0.3 mL IJ SOAJ injection Inject 0.3 mg into the muscle as needed for anaphylaxis.   hydrALAZINE (APRESOLINE) 25 MG tablet Take 1 tablet (25 mg total) by mouth 3 (three) times daily.   VITAMIN D PO Take by mouth.   No facility-administered encounter medications on file as of 09/30/2022.     Lab Results  Component Value Date   WBC 7.3 02/22/2022   HGB 14.5 02/22/2022   HCT 42.4 02/22/2022   PLT 206.0 02/22/2022   GLUCOSE 114 (H) 02/22/2022   CHOL 199 02/22/2022   TRIG 88.0 02/22/2022   HDL 65.30 02/22/2022   LDLDIRECT 145.4 03/01/2013   LDLCALC 116 (H) 02/22/2022   ALT 34 02/22/2022   AST 23 02/22/2022   NA 140 02/22/2022   K 4.2 02/22/2022   CL 106 02/22/2022   CREATININE 0.63 02/22/2022   BUN 11 02/22/2022   CO2 24 02/22/2022   TSH 4.88 02/22/2022   HGBA1C 5.8 02/22/2022   MICROALBUR 1.7 02/24/2022    No results found.     Assessment & Plan:  Type 2 diabetes mellitus  with hyperglycemia, without long-term current use of insulin (HCC) Assessment & Plan: Continue low carb diet and exercise.  Follow met b and a1c.   Lab Results  Component Value Date   HGBA1C 5.8 02/22/2022     Paroxysmal A-fib [facility]) Assessment & Plan: S/p ablation.  Off xarelto.  Afib/symptoms improved overall.  Since being on carvedilol, feels palpitations symptoms have improved. Follow pressures. Followed by cardiology.  Increase carvedilol to 1 1/2 tablet bid.    Abnormal liver function Assessment & Plan: Diet and exercise. Follow liver panel.    Diarrhea, unspecified type Assessment & Plan: Previous stool studies negative.  She has had persistent problems with diarrhea. (Ongoing for years). Symptoms seem to have been more noticeable after cholecystectomy 08/2018.  Work up to date has included - TTgIGA negative.  Lipase wnl.  CT abd/pelvis - diverticulosis, otherwise negative. Colonoscopy negative for microscopic colitis.  TSH/T4 negative.  No weight loss.  Eating.  No nausea or vomiting.   Have discussed history of loose stools.  Declines cholestyramine. Recent increase in stools.  Better now.  Follow.  Notify me if changes her mind about starting coloestid.    Hyperlipidemia, mixed Assessment & Plan: Low cholesterol diet and exercise.  Follow lipid panel.    Primary hypertension Assessment & Plan: Blood pressure remains elevated. Currently on carvedilol and hydralazine.  Taking hydralazine 25mg  tid. Blood pressure elevated today.  Still elevated above goal.  Have discussed treatment options.  Allegic to ACE inhibitor.  Was on hctz when had angioedema.  Intolerance to amlodipine.  Discussed increasing  carvedilol more.  Will increase to 6.25mg  1 1/2 tablet bid. Follow pressures. Follow metabolic panel.    OSA (obstructive sleep apnea) Assessment & Plan: Has oral device. Needs to use regularly.  Follow. Prefers not to use cpap.       Dale [place]asca, MD

## 2022-09-30 NOTE — Patient Instructions (Signed)
Colestipol (colestid)

## 2022-10-02 ENCOUNTER — Encounter: Payer: Self-pay | Admitting: Internal Medicine

## 2022-10-02 NOTE — Assessment & Plan Note (Signed)
Low cholesterol diet and exercise.  Follow lipid panel.   

## 2022-10-02 NOTE — Assessment & Plan Note (Signed)
S/p ablation.  Off xarelto.  Afib/symptoms improved overall.  Since being on carvedilol, feels palpitations symptoms have improved. Follow pressures. Followed by cardiology.  Increase carvedilol to 1 1/2 tablet bid.

## 2022-10-02 NOTE — Assessment & Plan Note (Signed)
Previous stool studies negative.  She has had persistent problems with diarrhea. (Ongoing for years). Symptoms seem to have been more noticeable after cholecystectomy 08/2018.  Work up to date has included - TTgIGA negative.  Lipase wnl.  CT abd/pelvis - diverticulosis, otherwise negative. Colonoscopy negative for microscopic colitis.  TSH/T4 negative.  No weight loss.  Eating.  No nausea or vomiting.   Have discussed history of loose stools.  Declines cholestyramine. Recent increase in stools.  Better now.  Follow.  Notify me if changes her mind about starting coloestid.

## 2022-10-02 NOTE — Assessment & Plan Note (Signed)
Blood pressure remains elevated. Currently on carvedilol and hydralazine.  Taking hydralazine 25mg  tid. Blood pressure elevated today.  Still elevated above goal.  Have discussed treatment options.  Allegic to ACE inhibitor.  Was on hctz when had angioedema.  Intolerance to amlodipine.  Discussed increasing carvedilol more.  Will increase to 6.25mg  1 1/2 tablet bid. Follow pressures. Follow metabolic panel.

## 2022-10-02 NOTE — Assessment & Plan Note (Signed)
Has oral device. Needs to use regularly.  Follow. Prefers not to use cpap.

## 2022-10-02 NOTE — Assessment & Plan Note (Signed)
Diet and exercise.  Follow liver panel.   

## 2022-10-02 NOTE — Assessment & Plan Note (Signed)
Continue low carb diet and exercise.  Follow met b and a1c.   Lab Results  Component Value Date   HGBA1C 5.8 02/22/2022   

## 2022-10-06 ENCOUNTER — Encounter (INDEPENDENT_AMBULATORY_CARE_PROVIDER_SITE_OTHER): Payer: Self-pay

## 2022-10-11 ENCOUNTER — Ambulatory Visit: Payer: BC Managed Care – PPO | Attending: Internal Medicine | Admitting: Internal Medicine

## 2022-10-11 ENCOUNTER — Encounter: Payer: Self-pay | Admitting: Internal Medicine

## 2022-10-11 VITALS — BP 126/88 | HR 84 | Ht 66.0 in | Wt 252.2 lb

## 2022-10-11 DIAGNOSIS — G4733 Obstructive sleep apnea (adult) (pediatric): Secondary | ICD-10-CM | POA: Diagnosis not present

## 2022-10-11 DIAGNOSIS — R0609 Other forms of dyspnea: Secondary | ICD-10-CM

## 2022-10-11 DIAGNOSIS — I10 Essential (primary) hypertension: Secondary | ICD-10-CM

## 2022-10-11 DIAGNOSIS — I480 Paroxysmal atrial fibrillation: Secondary | ICD-10-CM | POA: Diagnosis not present

## 2022-10-11 NOTE — Patient Instructions (Signed)
Medication Instructions:  Your physician recommends that you continue on your current medications as directed. Please refer to the Current Medication list given to you today.  *If you need a refill on your cardiac medications before your next appointment, please call your pharmacy*    Follow-Up: At [facility], you and your health needs are our priority.  As part of our continuing mission to provide you with exceptional heart care, we have created designated Provider Care Teams.  These Care Teams include your primary Cardiologist (physician) and Advanced Practice Providers (APPs -  Physician Assistants and Nurse Practitioners) who all work together to provide you with the care you need, when you need it.  We recommend signing up for the patient portal called "MyChart".  Sign up information is provided on this After Visit Summary.  MyChart is used to connect with patients for Virtual Visits (Telemedicine).  Patients are able to view lab/test results, encounter notes, upcoming appointments, etc.  Non-urgent messages can be sent to your provider as well.   To learn more about what you can do with MyChart, go to [email or web address].    Your next appointment:   6 month(s): call in November to scheduled next appointment  Provider:   Parke Poisson, MD

## 2022-10-11 NOTE — Progress Notes (Signed)
Cardiology Office Note:    Date:  10/11/2022   ID:  BEXLEE LOOPER, DOB October 30, 1965, MRN [id number]  PCP:  Dale Maud, MD  Cardiologist:  Parke Poisson, MD  Electrophysiologist:  None   Referring MD: Dale Hyannis, MD   Chief Complaint/Reason for Referral: Afib, HTN  History of Present Illness:    Joy Houston is a 57 y.o. female with a history of anemia, diabetes/hx of gestational diabetes, HTN, murmur, afib, mild sleep apnea, presents to establish care and evaluate afib and HTN.   10/11/22: Feeling better on carvedilol 9.375 mg twice daily.  Occasional rare palpitations but overall feeling well and blood pressure is relatively well-controlled, can continue to optimize diastolic blood pressure.  No chest pain, no shortness of breath.  Palpitations are not bothersome.  Prior visits: History of paroxysmal nonvalvular atrial fibrillation with prior ablation Dec 21.  Last seen by [facility] clinic 01/24/2022, no recurrent episodes, treated with rate controlling agent of metoprolol, then now carvedilol.  History of hypertension and sleep apnea, not on anticoagulation for a CHA2DS2-VASc score of 1.  EKG from 01/24/2022 reviewed sinus rhythm, LVH, nonspecific T wave abnormality.  Echocardiogram performed at OSH most recently October 16, 2019, ejection fraction 55%, grossly normal valves.  HTN on hydralazine 25mg  TID and carvedilol 6.25 mg BID. HCTZ and ACE-I stopped, had angioedema. Did not tolerate amlodipine (itching).   Overall she notes that with the recent addition of carvedilol she has felt better and her palpitations and exertional dyspnea have improved.  She does however continue to struggle with elevated blood pressure readings and at home her blood pressure is anywhere in the systolic range of 130-140 over approximately 80 diastolic.  She endorses a component of whitecoat hypertension with an elevated blood pressure of 168/98 today.  She plans to travel soon to the [place] and if we make dose adjustments she would like to do this after her trip.  We reviewed prior records of which I reviewed extensive outside documentation for this consultation.  She adds to the documentation that her father had congestive heart failure at age 56, and passed at age 70.  Her mother is 62 and also has atrial fibrillation.  She deals with MTHFR which makes it challenging for her to take many medications and medication classes without side effects.  She is leery of starting diuretics given her concerns for metabolic issues.  We did discuss considering spironolactone, but with its diuretic component she was less in favor.  Denies exertional chest discomfort.  Does endorse exertional shortness of breath which has improved with beta-blockade.  Denies syncope or presyncope.  Past Medical History:  Diagnosis Date   Anemia    Angio-edema    Atrial fibrillation (HCC)    Complication of anesthesia    Diabetes mellitus without complication (HCC)    diet controlled   Dysplastic nevus 06/27/2018   Left upper back. Moderate atypia, limited margins free.    Eczema    Epstein Barr infection    Family history of anesthesia complication    Mother - confusion   Fatty liver    Heart murmur    Slight - "nothing to worry about"   Hemorrhoid    Hypertension    Hypothyroidism    PONV (postoperative nausea and vomiting)    "only with c-sections"   Sleep apnea    mild    Past Surgical History:  Procedure Laterality Date   CESAREAN SECTION     CHOLECYSTECTOMY N/A  09/07/2018   Procedure: LAPAROSCOPIC CHOLECYSTECTOMY;  Surgeon: Carolan Shiver, MD;  Location: [facility] ORS;  Service: General;  Laterality: N/A;   FINGER SURGERY     pin to 3rd finger Right hand   LUMBAR LAMINECTOMY/DECOMPRESSION MICRODISCECTOMY  12/15/2011   Procedure: LUMBAR LAMINECTOMY/DECOMPRESSION MICRODISCECTOMY 1 LEVEL;  Surgeon: Cristi Loron, MD;  Location: [facility];  Service: Neurosurgery;   Laterality: Right;  RIGHT Lumbar four-Five diskectomy   LUMBAR LAMINECTOMY/DECOMPRESSION MICRODISCECTOMY N/A 11/04/2019   Procedure: MICRODISCECTOMY LEFT LUMBAR FIVE SACRAL ONE;  Surgeon: Tressie Stalker, MD;  Location[facility] OR;  Service: Neurosurgery;  Laterality: N/A;  3C   MICRODISCECTOMY LUMBAR     L4-5   WISDOM TOOTH EXTRACTION      Current Medications: Current Meds  Medication Sig   carvedilol (COREG) 6.25 MG tablet Take 1.5 tablets (9.375 mg total) by mouth 2 (two) times daily with a meal.   EPINEPHrine (EPIPEN 2-PAK) 0.3 mg/0.3 mL IJ SOAJ injection Inject 0.3 mg into the muscle as needed for anaphylaxis.   hydrALAZINE (APRESOLINE) 25 MG tablet Take 1 tablet (25 mg total) by mouth 3 (three) times daily.   ivermectin (STROMECTOL) 3 MG TABS tablet Take 6 mg by mouth 2 (two) times daily.   VITAMIN D PO Take by mouth.     Allergies:   Angiotensin receptor blockers, Lisinopril, Amlodipine besylate, Gluten meal, and Morphine and codeine   Social History   Tobacco Use   Smoking status: Never   Smokeless tobacco: Never  Vaping Use   Vaping status: Never Used  Substance Use Topics   Alcohol use: Not Currently    Alcohol/week: 0.0 standard drinks of alcohol   Drug use: No     Family History: The patient's family history includes Breast cancer (age of onset: 3) in her sister; Cancer in her father; Diabetes in her father and paternal grandfather; GER disease in her mother; Heart disease in her father and maternal grandfather.  ROS:   Please see the history of present illness.    All other systems reviewed and are negative.  EKGs/Labs/Other Studies Reviewed:    The following studies were reviewed today:  EKG:  n/a  Imaging studies that I have independently reviewed today: n/a  Recent Labs: 02/22/2022: ALT 34; BUN 11; Creatinine, Ser 0.63; Hemoglobin 14.5; Platelets 206.0; Potassium 4.2; Sodium 140; TSH 4.88  Recent Lipid Panel    Component Value Date/Time   CHOL 199  02/22/2022 0800   TRIG 88.0 02/22/2022 0800   HDL 65.30 02/22/2022 0800   CHOLHDL 3 02/22/2022 0800   VLDL 17.6 02/22/2022 0800   LDLCALC 116 (H) 02/22/2022 0800   LDLDIRECT 145.4 03/01/2013 0913    Physical Exam:    VS:  BP 126/88 (BP Location: Left Arm, Patient Position: Sitting, Cuff Size: Large)   Pulse 84   Ht 5\' 6"  (1.676 m)   Wt 252 lb 3.2 oz (114.4 kg)   SpO2 96%   BMI 40.71 kg/m     Wt Readings from Last 5 Encounters:  10/11/22 252 lb 3.2 oz (114.4 kg)  09/30/22 248 lb (112.5 kg)  08/01/22 245 lb 12.8 oz (111.5 kg)  07/29/22 245 lb (111.1 kg)  07/15/22 230 lb (104.3 kg)    Constitutional: No acute distress Eyes: sclera non-icteric, normal conjunctiva and lids ENMT: normal dentition, moist mucous membranes Cardiovascular: regular rhythm, normal rate, faint systolic murmur. S1 and S2 normal. No jugular venous distention.  Respiratory: clear to auscultation bilaterally GI : normal bowel sounds, soft and  nontender. No distention.   MSK: extremities warm, well perfused. No edema.  NEURO: grossly nonfocal exam, moves all extremities. PSYCH: alert and oriented x 3, normal mood and affect.   ASSESSMENT:    1. Paroxysmal A-fib (HCC)   2. Primary hypertension   3. OSA (obstructive sleep apnea)   4. DOE (dyspnea on exertion)     PLAN:    Paroxysmal A-fib (HCC)  Primary hypertension  OSA (obstructive sleep apnea)  DOE (dyspnea on exertion)  -We reviewed echocardiogram which showed no concerning findings related to OSA or dyspnea on exertion.  Mild to moderate LA dilation otherwise normal LV and RV function and no elevation of RVSP.  Grade 1 diastolic dysfunction but tissue Doppler is near normal.  Overall very reassuring echocardiogram -Okay to continue carvedilol 9.375 mg twice daily per patient preference would also continue hydralazine 25 mg 3 times daily as blood pressure is very well-controlled.  Total time of encounter: 25 minutes total time of encounter,  including 15 minutes spent in face-to-face patient care on the date of this encounter. This time includes coordination of care and counseling regarding above mentioned problem list. Remainder of non-face-to-face time involved reviewing chart documents/testing relevant to the patient encounter and documentation in the medical record. I have independently reviewed documentation from referring provider.   Weston Brass, MD, [facility] [place]  [facility]    Shared Decision Making/Informed Consent:       Medication Adjustments/Labs and Tests Ordered: Current medicines are reviewed at length with the patient today.  Concerns regarding medicines are outlined above.   No orders of the defined types were placed in this encounter.   No orders of the defined types were placed in this encounter.   Patient Instructions  Medication Instructions:  Your physician recommends that you continue on your current medications as directed. Please refer to the Current Medication list given to you today.  *If you need a refill on your cardiac medications before your next appointment, please call your pharmacy*    Follow-Up: At [facility], you and your health needs are our priority.  As part of our continuing mission to provide you with exceptional heart care, we have created designated Provider Care Teams.  These Care Teams include your primary Cardiologist (physician) and Advanced Practice Providers (APPs -  Physician Assistants and Nurse Practitioners) who all work together to provide you with the care you need, when you need it.  We recommend signing up for the patient portal called "MyChart".  Sign up information is provided on this After Visit Summary.  MyChart is used to connect with patients for Virtual Visits (Telemedicine).  Patients are able to view lab/test results, encounter notes, upcoming appointments, etc.  Non-urgent messages can be sent to your provider as well.   To learn more  about what you can do with MyChart, go to [email or web address].    Your next appointment:   6 month(s): call in November to scheduled next appointment  Provider:   Parke Poisson, MD

## 2022-10-23 ENCOUNTER — Other Ambulatory Visit: Payer: Self-pay | Admitting: Internal Medicine

## 2022-10-23 DIAGNOSIS — E119 Type 2 diabetes mellitus without complications: Secondary | ICD-10-CM | POA: Diagnosis not present

## 2022-11-16 ENCOUNTER — Encounter: Payer: Self-pay | Admitting: Internal Medicine

## 2022-11-16 ENCOUNTER — Ambulatory Visit: Payer: BC Managed Care – PPO | Admitting: Internal Medicine

## 2022-11-16 VITALS — BP 152/92 | HR 74 | Temp 97.9°F | Resp 16 | Ht 66.0 in | Wt 254.0 lb

## 2022-11-16 DIAGNOSIS — E782 Mixed hyperlipidemia: Secondary | ICD-10-CM | POA: Diagnosis not present

## 2022-11-16 DIAGNOSIS — F439 Reaction to severe stress, unspecified: Secondary | ICD-10-CM

## 2022-11-16 DIAGNOSIS — E538 Deficiency of other specified B group vitamins: Secondary | ICD-10-CM

## 2022-11-16 DIAGNOSIS — E1165 Type 2 diabetes mellitus with hyperglycemia: Secondary | ICD-10-CM | POA: Diagnosis not present

## 2022-11-16 DIAGNOSIS — I480 Paroxysmal atrial fibrillation: Secondary | ICD-10-CM

## 2022-11-16 DIAGNOSIS — I10 Essential (primary) hypertension: Secondary | ICD-10-CM

## 2022-11-16 DIAGNOSIS — E559 Vitamin D deficiency, unspecified: Secondary | ICD-10-CM

## 2022-11-16 DIAGNOSIS — K760 Fatty (change of) liver, not elsewhere classified: Secondary | ICD-10-CM

## 2022-11-16 LAB — BASIC METABOLIC PANEL
BUN: 13 mg/dL (ref 6–23)
CO2: 25 mEq/L (ref 19–32)
Calcium: 9.2 mg/dL (ref 8.4–10.5)
Chloride: 105 mEq/L (ref 96–112)
Creatinine, Ser: 0.64 mg/dL (ref 0.40–1.20)
GFR: 99.59 mL/min (ref >60.00)
Glucose, Bld: 114 mg/dL — ABNORMAL HIGH (ref 70–99)
Potassium: 3.9 mEq/L (ref 3.5–5.1)
Sodium: 139 mEq/L (ref 135–145)

## 2022-11-16 LAB — LIPID PANEL
Cholesterol: 162 mg/dL (ref 0–200)
HDL: 62.4 mg/dL (ref >39.00)
LDL Cholesterol: 86 mg/dL (ref 0–99)
NonHDL: 99.65
Total CHOL/HDL Ratio: 3
Triglycerides: 69 mg/dL (ref 0.0–149.0)
VLDL: 13.8 mg/dL (ref 0.0–40.0)

## 2022-11-16 LAB — TSH: TSH: 4.1 u[IU]/mL (ref 0.35–5.50)

## 2022-11-16 LAB — HEPATIC FUNCTION PANEL
ALT: 37 U/L — ABNORMAL HIGH (ref 0–35)
AST: 22 U/L (ref 0–37)
Albumin: 4.1 g/dL (ref 3.5–5.2)
Alkaline Phosphatase: 68 U/L (ref 39–117)
Bilirubin, Direct: 0.1 mg/dL (ref 0.0–0.3)
Total Bilirubin: 0.6 mg/dL (ref 0.2–1.2)
Total Protein: 7 g/dL (ref 6.0–8.3)

## 2022-11-16 LAB — VITAMIN B12: Vitamin B-12: 536 pg/mL (ref 211–911)

## 2022-11-16 LAB — CBC WITH DIFFERENTIAL/PLATELET
Basophils Absolute: 0.1 10*3/uL (ref 0.0–0.1)
Basophils Relative: 0.7 % (ref 0.0–3.0)
Eosinophils Absolute: 0.1 10*3/uL (ref 0.0–0.7)
Eosinophils Relative: 1.3 % (ref 0.0–5.0)
HCT: 42.6 % (ref 36.0–46.0)
Hemoglobin: 14.2 g/dL (ref 12.0–15.0)
Lymphocytes Relative: 23.3 % (ref 12.0–46.0)
Lymphs Abs: 1.8 10*3/uL (ref 0.7–4.0)
MCHC: 33.3 g/dL (ref 30.0–36.0)
MCV: 95.5 fl (ref 78.0–100.0)
Monocytes Absolute: 0.6 10*3/uL (ref 0.1–1.0)
Monocytes Relative: 8.3 % (ref 3.0–12.0)
Neutro Abs: 5 10*3/uL (ref 1.4–7.7)
Neutrophils Relative %: 66.4 % (ref 43.0–77.0)
Platelets: 225 10*3/uL (ref 150.0–400.0)
RBC: 4.46 Mil/uL (ref 3.87–5.11)
RDW: 13.2 % (ref 11.5–15.5)
WBC: 7.6 10*3/uL (ref 4.0–10.5)

## 2022-11-16 LAB — VITAMIN D 25 HYDROXY (VIT D DEFICIENCY, FRACTURES): VITD: 31.96 ng/mL (ref 30.00–100.00)

## 2022-11-16 LAB — HEMOGLOBIN A1C: Hgb A1c MFr Bld: 5.7 % (ref 4.6–6.5)

## 2022-11-16 MED ORDER — CARVEDILOL 6.25 MG PO TABS: 9.3750 mg | ORAL_TABLET | Freq: Two times a day (BID) | ORAL | 1 refills | Status: DC

## 2022-11-16 NOTE — Assessment & Plan Note (Signed)
Increased stress with husband's health issues.  Discussed.  Does not feel needs any further intervention.

## 2022-11-16 NOTE — Assessment & Plan Note (Addendum)
Continue low carb diet and exercise.  Follow met b and a1c.  Discussed diet and exercise today.  Lab Results  Component Value Date   HGBA1C 5.8 02/22/2022

## 2022-11-16 NOTE — Progress Notes (Signed)
Subjective:    Patient ID: Joy Houston, female    DOB: Nov 19, 1965, 57 y.o.   MRN: [id number]  Patient here for  Chief Complaint  Patient presents with   Medical Management of Chronic Issues    HPI Here for a scheduled follow up - f/u regarding her blood pressure, blood sugar and loose stool. Also with history of afib.  S/p ablation.  Off xarelto.  Taking carvedilol. Had f/u with cardiology 10/11/22.  Blood pressure remains elevated.  Discussed increasing carvedilol. She wants to hold on increasing now.  Increased stress with husband's health issues.  Discussed.  Does not feel needs any further intervention.  No chest pain reported.  Not exercising as much.  Plans to start.     Past Medical History:  Diagnosis Date   Anemia    Angio-edema    Atrial fibrillation (HCC)    Complication of anesthesia    Diabetes mellitus without complication (HCC)    diet controlled   Dysplastic nevus 06/27/2018   Left upper back. Moderate atypia, limited margins free.    Eczema    Epstein Barr infection    Family history of anesthesia complication    Mother - confusion   Fatty liver    Heart murmur    Slight - "nothing to worry about"   Hemorrhoid    Hypertension    Hypothyroidism    PONV (postoperative nausea and vomiting)    "only with c-sections"   Sleep apnea    mild   Past Surgical History:  Procedure Laterality Date   CESAREAN SECTION     CHOLECYSTECTOMY N/A 09/07/2018   Procedure: LAPAROSCOPIC CHOLECYSTECTOMY;  Surgeon: Carolan Shiver, MD;  Location: [facility] ORS;  Service: General;  Laterality: N/A;   FINGER SURGERY     pin to 3rd finger Right hand   LUMBAR LAMINECTOMY/DECOMPRESSION MICRODISCECTOMY  12/15/2011   Procedure: LUMBAR LAMINECTOMY/DECOMPRESSION MICRODISCECTOMY 1 LEVEL;  Surgeon: Cristi Loron, MD;  Location: [facility];  Service: Neurosurgery;  Laterality: Right;  RIGHT Lumbar four-Five diskectomy   LUMBAR LAMINECTOMY/DECOMPRESSION MICRODISCECTOMY N/A  11/04/2019   Procedure: MICRODISCECTOMY LEFT LUMBAR FIVE SACRAL ONE;  Surgeon: Tressie Stalker, MD;  Location: [facility] OR;  Service: Neurosurgery;  Laterality: N/A;  3C   MICRODISCECTOMY LUMBAR     L4-5   WISDOM TOOTH EXTRACTION     Family History  Problem Relation Age of Onset   GER disease Mother    Diabetes Father    Cancer Father        Melanoma, Prostate   Heart disease Father    Heart disease Maternal Grandfather    Diabetes Paternal Grandfather    Breast cancer Sister 2   Social History   Socioeconomic History   Marital status: Married    Spouse name: Not on file   Number of children: 2   Years of education: Not on file   Highest education level: Bachelor's degree (e.g., BA, AB, BS)  Occupational History    Employer: [employer]  Tobacco Use   Smoking status: Never   Smokeless tobacco: Never  Vaping Use   Vaping status: Never Used  Substance and Sexual Activity   Alcohol use: Not Currently    Alcohol/week: 0.0 standard drinks of alcohol   Drug use: No   Sexual activity: Not on file  Other Topics Concern   Not on file  Social History Narrative   Nurse    Married   Social Determinants of Health   Financial Resource Strain: Low  Risk  (07/28/2022)   Overall Financial Resource Strain (CARDIA)    Difficulty of Paying Living Expenses: Not hard at all  Food Insecurity: No Food Insecurity (07/28/2022)   Hunger Vital Sign    Worried About Running Out of Food in the Last Year: Never true    Ran Out of Food in the Last Year: Never true  Transportation Needs: No Transportation Needs (07/28/2022)   PRAPARE - Administrator, Civil Service (Medical): No    Lack of Transportation (Non-Medical): No  Physical Activity: Unknown (07/28/2022)   Exercise Vital Sign    Days of Exercise per Week: Patient declined    Minutes of Exercise per Session: Not on file  Stress: Patient Declined (07/28/2022)   [employer] of Occupational Health - Occupational Stress  Questionnaire    Feeling of Stress : Patient declined  Social Connections: Socially Integrated (07/28/2022)   Social Connection and Isolation Panel [NHANES]    Frequency of Communication with Friends and Family: More than three times a week    Frequency of Social Gatherings with Friends and Family: More than three times a week    Attends Religious Services: More than 4 times per year    Active Member of [employer] or Organizations: Yes    Attends Engineer, structural: More than 4 times per year    Marital Status: Married     Review of Systems  Constitutional:  Negative for appetite change and unexpected weight change.  HENT:  Negative for congestion and sinus pressure.   Respiratory:  Negative for cough, chest tightness and shortness of breath.   Cardiovascular:  Negative for chest pain, palpitations and leg swelling.  Gastrointestinal:  Negative for abdominal pain, diarrhea, nausea and vomiting.  Genitourinary:  Negative for difficulty urinating and dysuria.  Musculoskeletal:  Negative for joint swelling and myalgias.  Skin:  Negative for color change and rash.  Neurological:  Negative for dizziness and headaches.  Psychiatric/Behavioral:  Negative for agitation and dysphoric mood.        Objective:     BP (!) 152/92   Pulse 74   Temp 97.9 F (36.6 C)   Resp 16   Ht 5\' 6"  (1.676 m)   Wt 254 lb (115.2 kg)   SpO2 97%   BMI 41.00 kg/m  Wt Readings from Last 3 Encounters:  11/16/22 254 lb (115.2 kg)  10/11/22 252 lb 3.2 oz (114.4 kg)  09/30/22 248 lb (112.5 kg)    Physical Exam Vitals reviewed.  Constitutional:      General: She is not in acute distress.    Appearance: Normal appearance.  HENT:     Head: Normocephalic and atraumatic.     Right Ear: External ear normal.     Left Ear: External ear normal.  Eyes:     General: No scleral icterus.       Right eye: No discharge.        Left eye: No discharge.     Conjunctiva/sclera: Conjunctivae normal.  Neck:      Thyroid: No thyromegaly.  Cardiovascular:     Rate and Rhythm: Normal rate and regular rhythm.  Pulmonary:     Effort: No respiratory distress.     Breath sounds: Normal breath sounds. No wheezing.  Abdominal:     General: Bowel sounds are normal.     Palpations: Abdomen is soft.     Tenderness: There is no abdominal tenderness.  Musculoskeletal:        General:  No swelling or tenderness.     Cervical back: Neck supple. No tenderness.  Lymphadenopathy:     Cervical: No cervical adenopathy.  Skin:    Findings: No erythema or rash.  Neurological:     Mental Status: She is alert.  Psychiatric:        Mood and Affect: Mood normal.        Behavior: Behavior normal.      Outpatient Encounter Medications as of 11/16/2022  Medication Sig   carvedilol (COREG) 6.25 MG tablet Take 1.5 tablets (9.375 mg total) by mouth 2 (two) times daily with a meal.   EPINEPHrine (EPIPEN 2-PAK) 0.3 mg/0.3 mL IJ SOAJ injection Inject 0.3 mg into the muscle as needed for anaphylaxis.   hydrALAZINE (APRESOLINE) 25 MG tablet Take 1 tablet (25 mg total) by mouth 3 (three) times daily.   VITAMIN D PO Take by mouth.   [DISCONTINUED] carvedilol (COREG) 6.25 MG tablet Take 1.5 tablets (9.375 mg total) by mouth 2 (two) times daily with a meal.   [DISCONTINUED] ivermectin (STROMECTOL) 3 MG TABS tablet Take 6 mg by mouth 2 (two) times daily. (Patient not taking: Reported on 11/16/2022)   No facility-administered encounter medications on file as of 11/16/2022.     Lab Results  Component Value Date   WBC 7.3 02/22/2022   HGB 14.5 02/22/2022   HCT 42.4 02/22/2022   PLT 206.0 02/22/2022   GLUCOSE 114 (H) 02/22/2022   CHOL 199 02/22/2022   TRIG 88.0 02/22/2022   HDL 65.30 02/22/2022   LDLDIRECT 145.4 03/01/2013   LDLCALC 116 (H) 02/22/2022   ALT 34 02/22/2022   AST 23 02/22/2022   NA 140 02/22/2022   K 4.2 02/22/2022   CL 106 02/22/2022   CREATININE 0.63 02/22/2022   BUN 11 02/22/2022   CO2 24 02/22/2022    TSH 4.88 02/22/2022   HGBA1C 5.8 02/22/2022   MICROALBUR 1.7 02/24/2022    No results found.     Assessment & Plan:  Primary hypertension Assessment & Plan: Blood pressure remains elevated. Currently on carvedilol and hydralazine.  Taking hydralazine 25mg  tid. Blood pressure elevated today.  Still elevated above goal.  Have discussed treatment options.  Allegic to ACE inhibitor.  Was on hctz when had angioedema.  Intolerance to amlodipine.  Discussed increasing carvedilol more.  Taking 6.25mg  1 1/2 tablet bid. Wants to hold on increase. Follow pressures. Follow metabolic panel.   Orders: -     Basic metabolic panel  Hyperlipidemia, mixed Assessment & Plan: Low cholesterol diet and exercise.  Follow lipid panel.   Orders: -     CBC with Differential/Platelet -     TSH -     Hepatic function panel -     Lipid panel  Type 2 diabetes mellitus with hyperglycemia, without long-term current use of insulin (HCC) Assessment & Plan: Continue low carb diet and exercise.  Follow met b and a1c.  Discussed diet and exercise today.  Lab Results  Component Value Date   HGBA1C 5.8 02/22/2022    Orders: -     Hemoglobin A1c  Paroxysmal A-fib (HCC) Assessment & Plan: S/p ablation.  Off xarelto.  Afib/symptoms improved overall.  Since being on carvedilol, it appears that palpitations symptoms have improved. Follow pressures. Followed by cardiology.  Taking carvedilol 1 1 /2 tablet bid.    Fatty liver Assessment & Plan: Previously reported on ultrasound 2020.  Recent liver panel wnl.    Stress Assessment & Plan:   Increased stress  with husband's health issues.  Discussed.  Does not feel needs any further intervention.   B12 deficiency -     Vitamin B12  Vitamin D deficiency -     VITAMIN D 25 Hydroxy (Vit-D Deficiency, Fractures)  Other orders -     Carvedilol; Take 1.5 tablets (9.375 mg total) by mouth 2 (two) times daily with a meal.  Dispense: 270 tablet; Refill:  1     Dale Egan, MD

## 2022-11-16 NOTE — Assessment & Plan Note (Signed)
Blood pressure remains elevated. Currently on carvedilol and hydralazine.  Taking hydralazine 25mg  tid. Blood pressure elevated today.  Still elevated above goal.  Have discussed treatment options.  Allegic to ACE inhibitor.  Was on hctz when had angioedema.  Intolerance to amlodipine.  Discussed increasing carvedilol more.  Taking 6.25mg  1 1/2 tablet bid. Wants to hold on increase. Follow pressures. Follow metabolic panel.

## 2022-11-16 NOTE — Assessment & Plan Note (Signed)
Low cholesterol diet and exercise.  Follow lipid panel.

## 2022-11-16 NOTE — Assessment & Plan Note (Signed)
S/p ablation.  Off xarelto.  Afib/symptoms improved overall.  Since being on carvedilol, it appears that palpitations symptoms have improved. Follow pressures. Followed by cardiology.  Taking carvedilol 1 1 /2 tablet bid.

## 2022-11-16 NOTE — Assessment & Plan Note (Signed)
Previously reported on ultrasound 2020.  Recent liver panel wnl.

## 2022-11-22 DIAGNOSIS — E119 Type 2 diabetes mellitus without complications: Secondary | ICD-10-CM | POA: Diagnosis not present

## 2022-12-23 DIAGNOSIS — E119 Type 2 diabetes mellitus without complications: Secondary | ICD-10-CM | POA: Diagnosis not present

## 2023-01-10 ENCOUNTER — Encounter: Payer: Self-pay | Admitting: Internal Medicine

## 2023-01-10 ENCOUNTER — Ambulatory Visit: Payer: BC Managed Care – PPO | Admitting: Internal Medicine

## 2023-01-10 VITALS — BP 142/90 | HR 81 | Temp 97.9°F | Resp 16 | Ht 66.0 in | Wt 260.2 lb

## 2023-01-10 DIAGNOSIS — I480 Paroxysmal atrial fibrillation: Secondary | ICD-10-CM | POA: Diagnosis not present

## 2023-01-10 DIAGNOSIS — E1165 Type 2 diabetes mellitus with hyperglycemia: Secondary | ICD-10-CM

## 2023-01-10 DIAGNOSIS — R195 Other fecal abnormalities: Secondary | ICD-10-CM

## 2023-01-10 DIAGNOSIS — I10 Essential (primary) hypertension: Secondary | ICD-10-CM

## 2023-01-10 DIAGNOSIS — F439 Reaction to severe stress, unspecified: Secondary | ICD-10-CM

## 2023-01-10 DIAGNOSIS — K760 Fatty (change of) liver, not elsewhere classified: Secondary | ICD-10-CM

## 2023-01-10 MED ORDER — HYDRALAZINE HCL 25 MG PO TABS: 25.0000 mg | ORAL_TABLET | Freq: Three times a day (TID) | ORAL | 1 refills | Status: DC

## 2023-01-10 MED ORDER — CARVEDILOL 12.5 MG PO TABS: 12.5000 mg | ORAL_TABLET | Freq: Two times a day (BID) | ORAL | 1 refills | Status: DC

## 2023-01-10 MED ORDER — EPINEPHRINE 0.3 MG/0.3ML IJ SOAJ: 0.3000 mg | INTRAMUSCULAR | 0 refills | Status: AC | PRN

## 2023-01-10 NOTE — Assessment & Plan Note (Signed)
Increased stress with husband's health issues.  Appears to be doing well.  Follow.

## 2023-01-10 NOTE — Assessment & Plan Note (Signed)
S/p ablation.  Off xarelto.  Afib/symptoms improved overall.  Since being on carvedilol, it appears that palpitations symptoms have improved. Follow pressures. Followed by cardiology.  Taking carvedilol 1 1 /2 tablet bid. Increase to 12.5mg  bid.  Follow.

## 2023-01-10 NOTE — Assessment & Plan Note (Signed)
Blood pressure remains elevated. Currently on carvedilol and hydralazine.  Taking hydralazine 25mg  tid. Blood pressure elevated today.  Still elevated above goal.  Have discussed treatment options.  Allegic to ACE inhibitor.  Was on hctz when had angioedema.  Intolerance to amlodipine.  Discussed increasing carvedilol more.  Taking 6.25mg  1 1/2 tablet bid.  Given persistent elevation, will increase carvedilol to 12.5mg  bid. Follow pressures. Follow metabolic panel.

## 2023-01-10 NOTE — Assessment & Plan Note (Signed)
Persistent loose stool.  Appears to be worse after covid.  Is s/p cholecystectomy.  Was present prior to having her gallbladder removed.  Saw GI.  Prescribed colestid.  Has not started.  Wants to hold.  Discussed.

## 2023-01-10 NOTE — Assessment & Plan Note (Signed)
Continue low carb diet and exercise.  Follow met b and a1c.  Discussed diet and exercise today.  Lab Results  Component Value Date   HGBA1C 5.7 11/16/2022

## 2023-01-10 NOTE — Assessment & Plan Note (Addendum)
Previously reported on ultrasound 2020.  Recent liver panel - ALT 37.  Diet, exercise and weight loss.

## 2023-01-10 NOTE — Progress Notes (Signed)
Subjective:    Patient ID: Joy Houston, female    DOB: January 20, 1966, 57 y.o.   MRN: [id number]  Patient here for  Chief Complaint  Patient presents with   Medical Management of Chronic Issues    HPI Here for a scheduled follow up - f/u regarding her blood pressure, blood sugar and loose stool. Also with history of afib.  S/p ablation.  Off xarelto.  Taking carvedilol. Had f/u with cardiology 10/11/22. Blood pressure remaining elevated. Discussed increasing carvedilol.  Agreeable. Also discussed weight gain.  Discussed diet and exercise. Had questions about intermittent fasting. No chest pain reported.  Occasional palpitations.  Symptoms overall improved. Not walking. Plans to start. Persistent issues with her bowels.  Discussed.    Past Medical History:  Diagnosis Date   Anemia    Angio-edema    Atrial fibrillation (HCC)    Complication of anesthesia    Diabetes mellitus without complication (HCC)    diet controlled   Dysplastic nevus 06/27/2018   Left upper back. Moderate atypia, limited margins free.    Eczema    Epstein Barr infection    Family history of anesthesia complication    Mother - confusion   Fatty liver    Heart murmur    Slight - "nothing to worry about"   Hemorrhoid    Hypertension    Hypothyroidism    PONV (postoperative nausea and vomiting)    "only with c-sections"   Sleep apnea    mild   Past Surgical History:  Procedure Laterality Date   CESAREAN SECTION     CHOLECYSTECTOMY N/A 09/07/2018   Procedure: LAPAROSCOPIC CHOLECYSTECTOMY;  Surgeon: Carolan Shiver, MD;  Location: [facility] ORS;  Service: General;  Laterality: N/A;   FINGER SURGERY     pin to 3rd finger Right hand   LUMBAR LAMINECTOMY/DECOMPRESSION MICRODISCECTOMY  12/15/2011   Procedure: LUMBAR LAMINECTOMY/DECOMPRESSION MICRODISCECTOMY 1 LEVEL;  Surgeon: Cristi Loron, MD;  Location: [facility];  Service: Neurosurgery;  Laterality: Right;  RIGHT Lumbar four-Five diskectomy    LUMBAR LAMINECTOMY/DECOMPRESSION MICRODISCECTOMY N/A 11/04/2019   Procedure: MICRODISCECTOMY LEFT LUMBAR FIVE SACRAL ONE;  Surgeon: Tressie Stalker, MD;  Location: [facility] OR;  Service: Neurosurgery;  Laterality: N/A;  3C   MICRODISCECTOMY LUMBAR     L4-5   WISDOM TOOTH EXTRACTION     Family History  Problem Relation Age of Onset   GER disease Mother    Diabetes Father    Cancer Father        Melanoma, Prostate   Heart disease Father    Heart disease Maternal Grandfather    Diabetes Paternal Grandfather    Breast cancer Sister 1   Social History   Socioeconomic History   Marital status: Married    Spouse name: Not on file   Number of children: 2   Years of education: Not on file   Highest education level: Bachelor's degree (e.g., BA, AB, BS)  Occupational History    Employer: [employer]  Tobacco Use   Smoking status: Never   Smokeless tobacco: Never  Vaping Use   Vaping status: Never Used  Substance and Sexual Activity   Alcohol use: Not Currently    Alcohol/week: 0.0 standard drinks of alcohol   Drug use: No   Sexual activity: Not on file  Other Topics Concern   Not on file  Social History Narrative   Nurse    Married   Social Determinants of Health   Financial Resource Strain: Low Risk  (  07/28/2022)   Overall Financial Resource Strain (CARDIA)    Difficulty of Paying Living Expenses: Not hard at all  Food Insecurity: No Food Insecurity (07/28/2022)   Hunger Vital Sign    Worried About Running Out of Food in the Last Year: Never true    Ran Out of Food in the Last Year: Never true  Transportation Needs: No Transportation Needs (07/28/2022)   PRAPARE - Administrator, Civil Service (Medical): No    Lack of Transportation (Non-Medical): No  Physical Activity: Unknown (07/28/2022)   Exercise Vital Sign    Days of Exercise per Week: Patient declined    Minutes of Exercise per Session: Not on file  Stress: Patient Declined (07/28/2022)   [employer]  of Occupational Health - Occupational Stress Questionnaire    Feeling of Stress : Patient declined  Social Connections: Socially Integrated (07/28/2022)   Social Connection and Isolation Panel [NHANES]    Frequency of Communication with Friends and Family: More than three times a week    Frequency of Social Gatherings with Friends and Family: More than three times a week    Attends Religious Services: More than 4 times per year    Active Member of [employer] or Organizations: Yes    Attends Engineer, structural: More than 4 times per year    Marital Status: Married     Review of Systems  Constitutional:  Negative for appetite change.       Increased weight.   HENT:  Negative for congestion and sinus pressure.   Respiratory:  Negative for cough, chest tightness and shortness of breath.   Cardiovascular:  Positive for palpitations. Negative for chest pain and leg swelling.  Gastrointestinal:  Negative for abdominal pain, diarrhea, nausea and vomiting.  Genitourinary:  Negative for difficulty urinating and dysuria.  Musculoskeletal:  Negative for joint swelling and myalgias.  Skin:  Negative for color change.       Yeast rash.   Neurological:  Negative for dizziness and headaches.  Psychiatric/Behavioral:  Negative for agitation and dysphoric mood.        Objective:     BP (!) 142/90   Pulse 81   Temp 97.9 F (36.6 C)   Resp 16   Ht 5\' 6"  (1.676 m)   Wt 260 lb 3.2 oz (118 kg)   SpO2 98%   BMI 42.00 kg/m  Wt Readings from Last 3 Encounters:  01/10/23 260 lb 3.2 oz (118 kg)  11/16/22 254 lb (115.2 kg)  10/11/22 252 lb 3.2 oz (114.4 kg)    Physical Exam Vitals reviewed.  Constitutional:      General: She is not in acute distress.    Appearance: Normal appearance.  HENT:     Head: Normocephalic and atraumatic.     Right Ear: External ear normal.     Left Ear: External ear normal.  Eyes:     General: No scleral icterus.       Right eye: No discharge.        Left  eye: No discharge.     Conjunctiva/sclera: Conjunctivae normal.  Neck:     Thyroid: No thyromegaly.  Cardiovascular:     Rate and Rhythm: Normal rate and regular rhythm.  Pulmonary:     Effort: No respiratory distress.     Breath sounds: Normal breath sounds. No wheezing.  Abdominal:     General: Bowel sounds are normal.     Palpations: Abdomen is soft.  Tenderness: There is no abdominal tenderness.  Musculoskeletal:        General: No swelling or tenderness.     Cervical back: Neck supple. No tenderness.  Lymphadenopathy:     Cervical: No cervical adenopathy.  Skin:    Findings: No erythema or rash.  Neurological:     Mental Status: She is alert.  Psychiatric:        Mood and Affect: Mood normal.        Behavior: Behavior normal.      Outpatient Encounter Medications as of 01/10/2023  Medication Sig   carvedilol (COREG) 12.5 MG tablet Take 1 tablet (12.5 mg total) by mouth 2 (two) times daily with a meal.   EPINEPHrine (EPIPEN 2-PAK) 0.3 mg/0.3 mL IJ SOAJ injection Inject 0.3 mg into the muscle as needed for anaphylaxis.   hydrALAZINE (APRESOLINE) 25 MG tablet Take 1 tablet (25 mg total) by mouth 3 (three) times daily.   VITAMIN D PO Take by mouth.   [DISCONTINUED] carvedilol (COREG) 6.25 MG tablet Take 1.5 tablets (9.375 mg total) by mouth 2 (two) times daily with a meal.   [DISCONTINUED] EPINEPHrine (EPIPEN 2-PAK) 0.3 mg/0.3 mL IJ SOAJ injection Inject 0.3 mg into the muscle as needed for anaphylaxis.   [DISCONTINUED] hydrALAZINE (APRESOLINE) 25 MG tablet Take 1 tablet (25 mg total) by mouth 3 (three) times daily.   No facility-administered encounter medications on file as of 01/10/2023.     Lab Results  Component Value Date   WBC 7.6 11/16/2022   HGB 14.2 11/16/2022   HCT 42.6 11/16/2022   PLT 225.0 11/16/2022   GLUCOSE 114 (H) 11/16/2022   CHOL 162 11/16/2022   TRIG 69.0 11/16/2022   HDL 62.40 11/16/2022   LDLDIRECT 145.4 03/01/2013   LDLCALC 86 11/16/2022    ALT 37 (H) 11/16/2022   AST 22 11/16/2022   NA 139 11/16/2022   K 3.9 11/16/2022   CL 105 11/16/2022   CREATININE 0.64 11/16/2022   BUN 13 11/16/2022   CO2 25 11/16/2022   TSH 4.10 11/16/2022   HGBA1C 5.7 11/16/2022   MICROALBUR 1.7 02/24/2022    No results found.     Assessment & Plan:  Type 2 diabetes mellitus with hyperglycemia, without long-term current use of insulin (HCC) Assessment & Plan: Continue low carb diet and exercise.  Follow met b and a1c.  Discussed diet and exercise today.  Lab Results  Component Value Date   HGBA1C 5.7 11/16/2022     Stress Assessment & Plan:   Increased stress with husband's health issues.  Appears to be doing well.  Follow.    Paroxysmal A-fib [facility]) Assessment & Plan: S/p ablation.  Off xarelto.  Afib/symptoms improved overall.  Since being on carvedilol, it appears that palpitations symptoms have improved. Follow pressures. Followed by cardiology.  Taking carvedilol 1 1 /2 tablet bid. Increase to 12.5mg  bid.  Follow.    Loose stools Assessment & Plan: Persistent loose stool.  Appears to be worse after covid.  Is s/p cholecystectomy.  Was present prior to having her gallbladder removed.  Saw GI.  Prescribed colestid.  Has not started.  Wants to hold.  Discussed.    Fatty liver Assessment & Plan: Previously reported on ultrasound 2020.  Recent liver panel - ALT 37.  Diet, exercise and weight loss.    Primary hypertension Assessment & Plan: Blood pressure remains elevated. Currently on carvedilol and hydralazine.  Taking hydralazine 25mg  tid. Blood pressure elevated today.  Still elevated above goal.  Have discussed treatment options.  Allegic to ACE inhibitor.  Was on hctz when had angioedema.  Intolerance to amlodipine.  Discussed increasing carvedilol more.  Taking 6.25mg  1 1/2 tablet bid.  Given persistent elevation, will increase carvedilol to 12.5mg  bid. Follow pressures. Follow metabolic panel.    Other orders -      EPINEPHrine; Inject 0.3 mg into the muscle as needed for anaphylaxis.  Dispense: 2 each; Refill: 0 -     hydrALAZINE HCl; Take 1 tablet (25 mg total) by mouth 3 (three) times daily.  Dispense: 270 tablet; Refill: 1 -     Carvedilol; Take 1 tablet (12.5 mg total) by mouth 2 (two) times daily with a meal.  Dispense: 180 tablet; Refill: 1     Dale Dwight Mission, MD

## 2023-01-18 ENCOUNTER — Encounter: Payer: BC Managed Care – PPO | Admitting: Dermatology

## 2023-01-22 DIAGNOSIS — E119 Type 2 diabetes mellitus without complications: Secondary | ICD-10-CM | POA: Diagnosis not present

## 2023-01-31 ENCOUNTER — Ambulatory Visit: Payer: BC Managed Care – PPO | Admitting: Dermatology

## 2023-01-31 ENCOUNTER — Encounter: Payer: Self-pay | Admitting: Dermatology

## 2023-01-31 DIAGNOSIS — L820 Inflamed seborrheic keratosis: Secondary | ICD-10-CM | POA: Diagnosis not present

## 2023-01-31 DIAGNOSIS — L570 Actinic keratosis: Secondary | ICD-10-CM | POA: Diagnosis not present

## 2023-01-31 DIAGNOSIS — Z86018 Personal history of other benign neoplasm: Secondary | ICD-10-CM

## 2023-01-31 DIAGNOSIS — L821 Other seborrheic keratosis: Secondary | ICD-10-CM

## 2023-01-31 DIAGNOSIS — Z1283 Encounter for screening for malignant neoplasm of skin: Secondary | ICD-10-CM | POA: Diagnosis not present

## 2023-01-31 DIAGNOSIS — L219 Seborrheic dermatitis, unspecified: Secondary | ICD-10-CM

## 2023-01-31 DIAGNOSIS — W908XXA Exposure to other nonionizing radiation, initial encounter: Secondary | ICD-10-CM

## 2023-01-31 DIAGNOSIS — D492 Neoplasm of unspecified behavior of bone, soft tissue, and skin: Secondary | ICD-10-CM | POA: Diagnosis not present

## 2023-01-31 DIAGNOSIS — L578 Other skin changes due to chronic exposure to nonionizing radiation: Secondary | ICD-10-CM

## 2023-01-31 DIAGNOSIS — L814 Other melanin hyperpigmentation: Secondary | ICD-10-CM

## 2023-01-31 DIAGNOSIS — L853 Xerosis cutis: Secondary | ICD-10-CM

## 2023-01-31 DIAGNOSIS — D1801 Hemangioma of skin and subcutaneous tissue: Secondary | ICD-10-CM

## 2023-01-31 DIAGNOSIS — L304 Erythema intertrigo: Secondary | ICD-10-CM

## 2023-01-31 DIAGNOSIS — D229 Melanocytic nevi, unspecified: Secondary | ICD-10-CM

## 2023-01-31 DIAGNOSIS — Z808 Family history of malignant neoplasm of other organs or systems: Secondary | ICD-10-CM

## 2023-01-31 MED ORDER — HYDROCORTISONE 2.5 % EX CREA: TOPICAL_CREAM | CUTANEOUS | 11 refills | Status: DC

## 2023-01-31 MED ORDER — KETOCONAZOLE 2 % EX CREA: TOPICAL_CREAM | CUTANEOUS | 5 refills | Status: DC

## 2023-01-31 NOTE — Progress Notes (Signed)
Follow-Up Visit   Subjective  Joy Houston is a 57 y.o. female who presents for the following: Skin Cancer Screening and Full Body Skin Exam. Hx of dysplastic nevus. No personal Hx of skin cancer. Father had Hx of MM.  Spot on left hand has gotten darker. Also new spots on hands are darkening.Rough areas on left ear helix.  The patient presents for Total-Body Skin Exam (TBSE) for skin cancer screening and mole check. The patient has spots, moles and lesions to be evaluated, some may be new or changing and the patient may have concern these could be cancer.    The following portions of the chart were reviewed this encounter and updated as appropriate: medications, allergies, medical history  Review of Systems:  No other skin or systemic complaints except as noted in HPI or Assessment and Plan.  Objective  Well appearing patient in no apparent distress; mood and affect are within normal limits.  A full examination was performed including scalp, head, eyes, ears, nose, lips, neck, chest, axillae, abdomen, back, buttocks, bilateral upper extremities, bilateral lower extremities, hands, feet, fingers, toes, fingernails, and toenails. All findings within normal limits unless otherwise noted below.   Relevant physical exam findings are noted in the Assessment and Plan.  Left Dorsal Wrist 4 mm med dark brown macule with adjacent light brown macule total size 6 mm       Left Ear Helix x1 Keratotic macule  Right Foot - dorsum x1, R hand dorsum x6, L hand dorsum x9, R forearm x4, chest x1 (21) Erythematous keratotic or waxy stuck-on macule    Assessment & Plan   HISTORY OF DYSPLASTIC NEVUS. Left upper back. Moderate atypia, limited margins free. 06/27/2018. No evidence of recurrence today Recommend regular full body skin exams Recommend daily broad spectrum sunscreen SPF 30+ to sun-exposed areas, reapply every 2 hours as needed.  Call if any new or changing lesions are noted  between office visits   FAMILY HISTORY OF SKIN CANCER What type(s): Melanoma Who affected: Father    SKIN CANCER SCREENING PERFORMED TODAY.  ACTINIC DAMAGE - Chronic condition, secondary to cumulative UV/sun exposure - diffuse scaly erythematous macules with underlying dyspigmentation - Recommend daily broad spectrum sunscreen SPF 30+ to sun-exposed areas, reapply every 2 hours as needed.  - Staying in the shade or wearing long sleeves, sun glasses (UVA+UVB protection) and wide brim hats (4-inch brim around the entire circumference of the hat) are also recommended for sun protection.  - Call for new or changing lesions.  LENTIGINES, SEBORRHEIC KERATOSES, HEMANGIOMAS - Benign normal skin lesions - Benign-appearing - Call for any changes  MELANOCYTIC NEVI - Tan-brown and/or pink-flesh-colored symmetric macules and papules - Benign appearing on exam today - Observation - Call clinic for new or changing moles - Recommend daily use of broad spectrum spf 30+ sunscreen to sun-exposed areas.   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 Condition is symptomatic/ bothersome to patient. Not currently at goal.    Seborrheic Dermatitis is a chronic persistent rash characterized by pinkness and scaling most commonly of the mid face but also can occur on the scalp (dandruff), ears; mid chest, mid back and groin.  It tends to be exacerbated by stress and cooler weather.  People who have neurologic disease may experience new onset or exacerbation of existing seborrheic dermatitis.  The  condition is not curable but treatable and can be controlled.  Treatment Plan: Recommend using Hydrocortisone 2.5% cream twice daily as needed. Avoid sleeping on left side or use a travel pillow with hole for ear   Xerosis - diffuse xerotic  patches - recommend gentle, hydrating skin care - gentle skin care handout given - Recommend using AmLactin lotion (blue)  INTERTRIGO Exam Clear today  Chronic condition with duration or expected duration over one year. Currently well-controlled.   Intertrigo is a chronic recurrent rash that occurs in skin fold areas that may be associated with friction; heat; moisture; yeast; fungus; and bacteria.  It is exacerbated by increased movement / activity; sweating; and higher atmospheric temperature.  Treatment Plan Continue ketoconazole cream twice a day as needed for rash.  Continue hydrocortisone 2.5% cream twice a day for up to 2 weeks as needed for rash.   Topical steroids (such as triamcinolone, fluocinolone, fluocinonide, mometasone, clobetasol, halobetasol, betamethasone, hydrocortisone) can cause thinning and lightening of the skin if they are used for too long in the same area. Your physician has selected the right strength medicine for your problem and area affected on the body. Please use your medication only as directed by your physician to prevent side effects.   Start Zeasorb AF powder or other OTC antifungal powder to the area daily to prevent rash recurrence. Other options to help keep the area dry include blow drying the area after bathing or using antiperspirant products such as Duradry sweat minimizing gel to help keep the area dry.    Neoplasm of skin Left Dorsal Wrist  Skin / nail biopsy Type of biopsy: tangential   Informed consent: discussed and consent obtained   Anesthesia: the lesion was anesthetized in a standard fashion   Anesthesia comment:  Area prepped with alcohol Anesthetic:  1% lidocaine w/ epinephrine 1-100,000 buffered w/ 8.4% NaHCO3 Instrument used: flexible razor blade   Hemostasis achieved with: pressure, aluminum chloride and electrodesiccation   Outcome: patient tolerated procedure well   Post-procedure details: wound care instructions given    Post-procedure details comment:  Ointment and small bandage applied  Specimen 1 - Surgical pathology Differential Diagnosis: Lentigo vs SK, R/O atypia  Check Margins: No  AK (actinic keratosis) Left Ear Helix x1  Actinic keratoses are precancerous spots that appear secondary to cumulative UV radiation exposure/sun exposure over time. They are chronic with expected duration over 1 year. A portion of actinic keratoses will progress to squamous cell carcinoma of the skin. It is not possible to reliably predict which spots will progress to skin cancer and so treatment is recommended to prevent development of skin cancer.  Recommend daily broad spectrum sunscreen SPF 30+ to sun-exposed areas, reapply every 2 hours as needed.  Recommend staying in the shade or wearing long sleeves, sun glasses (UVA+UVB protection) and wide brim hats (4-inch brim around the entire circumference of the hat). Call for new or changing lesions.  Destruction of lesion - Left Ear Helix x1  Destruction method: cryotherapy   Informed consent: discussed and consent obtained   Lesion destroyed using liquid nitrogen: Yes   Region frozen until ice ball extended beyond lesion: Yes   Outcome: patient tolerated procedure well with no complications   Post-procedure details: wound care instructions given   Additional details:  Prior to procedure, discussed risks of blister formation, small wound, skin dyspigmentation, or rare scar following cryotherapy. Recommend Vaseline ointment to treated areas while healing.   Inflamed seborrheic keratosis (21) Right Foot -  dorsum x1, R hand dorsum x6, L hand dorsum x9, R forearm x4, chest x1  Symptomatic, irritating, patient would like treated.  Destruction of lesion - Right Foot - dorsum x1, R hand dorsum x6, L hand dorsum x9, R forearm x4, chest x1 (21)  Destruction method: cryotherapy   Informed consent: discussed and consent obtained   Lesion destroyed using liquid nitrogen:  Yes   Region frozen until ice ball extended beyond lesion: Yes   Outcome: patient tolerated procedure well with no complications   Post-procedure details: wound care instructions given   Additional details:  Prior to procedure, discussed risks of blister formation, small wound, skin dyspigmentation, or rare scar following cryotherapy. Recommend Vaseline ointment to treated areas while healing.    Return in about 1 year (around 01/31/2024) for TBSE, HxDN.  I, Lawson Radar, CMA, am acting as scribe for Willeen Niece, MD.   Documentation: I have reviewed the above documentation for accuracy and completeness, and I agree with the above.  Willeen Niece, MD

## 2023-01-31 NOTE — Patient Instructions (Addendum)
Cryotherapy Aftercare  Wash gently with soap and water everyday.   Apply Vaseline jelly daily until healed.     Wound Care Instructions  Cleanse wound gently with soap and water once a day then pat dry with clean gauze. Apply a thin coat of Petrolatum (petroleum jelly, "Vaseline") over the wound (unless you have an allergy to this). We recommend that you use a new, sterile tube of Vaseline. Do not pick or remove scabs. Do not remove the yellow or white "healing tissue" from the base of the wound.  Cover the wound with fresh, clean, nonstick gauze and secure with paper tape. You may use Band-Aids in place of gauze and tape if the wound is small enough, but would recommend trimming much of the tape off as there is often too much. Sometimes Band-Aids can irritate the skin.  You should call the office for your biopsy report after 1 week if you have not already been contacted.  If you experience any problems, such as abnormal amounts of bleeding, swelling, significant bruising, significant pain, or evidence of infection, please call the office immediately.  FOR ADULT SURGERY PATIENTS: If you need something for pain relief you may take 1 extra strength Tylenol (acetaminophen) AND 2 Ibuprofen (200mg  each) together every 4 hours as needed for pain. (do not take these if you are allergic to them or if you have a reason you should not take them.) Typically, you may only need pain medication for 1 to 3 days.    Recommend daily broad spectrum sunscreen SPF 30+ to sun-exposed areas, reapply every 2 hours as needed. Call for new or changing lesions.  Staying in the shade or wearing long sleeves, sun glasses (UVA+UVB protection) and wide brim hats (4-inch brim around the entire circumference of the hat) are also recommended for sun protection.     Melanoma ABCDEs  Melanoma is the most dangerous type of skin cancer, and is the leading cause of death from skin disease.  You are more likely to develop  melanoma if you: Have light-colored skin, light-colored eyes, or red or blond hair Spend a lot of time in the sun Tan regularly, either outdoors or in a tanning bed Have had blistering sunburns, especially during childhood Have a close family member who has had a melanoma Have atypical moles or large birthmarks  Early detection of melanoma is key since treatment is typically straightforward and cure rates are extremely high if we catch it early.   The first sign of melanoma is often a change in a mole or a new dark spot.  The ABCDE system is a way of remembering the signs of melanoma.  A for asymmetry:  The two halves do not match. B for border:  The edges of the growth are irregular. C for color:  A mixture of colors are present instead of an even brown color. D for diameter:  Melanomas are usually (but not always) greater than 6mm - the size of a pencil eraser. E for evolution:  The spot keeps changing in size, shape, and color.  Please check your skin once per month between visits. You can use a small mirror in front and a large mirror behind you to keep an eye on the back side or your body.   If you see any new or changing lesions before your next follow-up, please call to schedule a visit.  Please continue daily skin protection including broad spectrum sunscreen SPF 30+ to sun-exposed areas, reapplying every 2 hours  as needed when you're outdoors.   Staying in the shade or wearing long sleeves, sun glasses (UVA+UVB protection) and wide brim hats (4-inch brim around the entire circumference of the hat) are also recommended for sun protection.     Rash at abdomen: Continue ketoconazole cream twice a day as needed for rash.  Continue hydrocortisone 2.5% cream twice a day for up to 2 weeks as needed for rash.   Topical steroids (such as triamcinolone, fluocinolone, fluocinonide, mometasone, clobetasol, halobetasol, betamethasone, hydrocortisone) can cause thinning and lightening of  the skin if they are used for too long in the same area. Your physician has selected the right strength medicine for your problem and area affected on the body. Please use your medication only as directed by your physician to prevent side effects.   Start Zeasorb AF powder or other OTC antifungal powder to the area daily to prevent rash recurrence. Other options to help keep the area dry include blow drying the area after bathing or using antiperspirant products such as Duradry to help keep the area dry.      Gentle Skin Care Guide  1. Bathe no more than once a day.  2. Avoid bathing in hot water  3. Use a mild soap like Dove, Vanicream, Cetaphil, CeraVe. Can use Lever 2000 or Cetaphil antibacterial soap  4. Use soap only where you need it. On most days, use it under your arms, between your legs, and on your feet. Let the water rinse other areas unless visibly dirty.  5. When you get out of the bath/shower, use a towel to gently blot your skin dry, don't rub it.  6. While your skin is still a little damp, apply a moisturizing cream such as Vanicream, CeraVe, Cetaphil, Eucerin, Sarna lotion or plain Vaseline Jelly. For hands apply Neutrogena [place] Hand Cream or Excipial Hand Cream.  7. Reapply moisturizer any time you start to itch or feel dry.  8. Sometimes using free and clear laundry detergents can be helpful. Fabric softener sheets should be avoided. Downy Free & Gentle liquid, or any liquid fabric softener that is free of dyes and perfumes, it acceptable to use  9. If your doctor has given you prescription creams you may apply moisturizers over them       Due to recent changes in healthcare laws, you may see results of your pathology and/or laboratory studies on MyChart before the doctors have had a chance to review them. We understand that in some cases there may be results that are confusing or concerning to you. Please understand that not all results are received at the same  time and often the doctors may need to interpret multiple results in order to provide you with the best plan of care or course of treatment. Therefore, we ask that you please give [name] 2 business days to thoroughly review all your results before contacting the office for clarification. Should we see a critical lab result, you will be contacted sooner.   If You Need Anything After Your Visit  If you have any questions or concerns for your doctor, please call our main line at [phone] and press option 4 to reach your doctor's medical assistant. If no one answers, please leave a voicemail as directed and we will return your call as soon as possible. Messages left after 4 pm will be answered the following business day.   You may also send [name] a message via MyChart. We typically respond to MyChart messages within 1-2  business days.  For prescription refills, please ask your pharmacy to contact our office. Our fax number is [phone].  If you have an urgent issue when the clinic is closed that cannot wait until the next business day, you can page your doctor at the number below.    Please note that while we do our best to be available for urgent issues outside of office hours, we are not available 24/7.   If you have an urgent issue and are unable to reach [name], you may choose to seek medical care at your doctor's office, retail clinic, urgent care center, or emergency room.  If you have a medical emergency, please immediately call 911 or go to the emergency department.  Pager Numbers  - Dr. Gwen Pounds: [phone]  - Dr. Roseanne Reno: [phone]  - Dr. Katrinka Blazing: [phone]   In the event of inclement weather, please call our main line at [phone] for an update on the status of any delays or closures.  Dermatology Medication Tips: Please keep the boxes that topical medications come in in order to help keep track of the instructions about where and how to use these. Pharmacies typically print  the medication instructions only on the boxes and not directly on the medication tubes.   If your medication is too expensive, please contact our office at [phone] option 4 or send [name] a message through MyChart.   We are unable to tell what your co-pay for medications will be in advance as this is different depending on your insurance coverage. However, we may be able to find a substitute medication at lower cost or fill out paperwork to get insurance to cover a needed medication.   If a prior authorization is required to get your medication covered by your insurance company, please allow [name] 1-2 business days to complete this process.  Drug prices often vary depending on where the prescription is filled and some pharmacies may offer cheaper prices.  The website www.goodrx.com contains coupons for medications through different pharmacies. The prices here do not account for what the cost may be with help from insurance (it may be cheaper with your insurance), but the website can give you the price if you did not use any insurance.  - You can print the associated coupon and take it with your prescription to the pharmacy.  - You may also stop by our office during regular business hours and pick up a GoodRx coupon card.  - If you need your prescription sent electronically to a different pharmacy, notify our office through [facility] or by phone at [phone] option 4.     Si Usted Necesita Algo Despus de Su Visita  Tambin puede enviarnos un mensaje a travs de Clinical cytogeneticist. Por lo general respondemos a los mensajes de MyChart en el transcurso de 1 a 2 das hbiles.  Para renovar recetas, por favor pida a su farmacia que se ponga en contacto con nuestra oficina. Annie Sable de fax es [place] [phone].  Si tiene un asunto urgente cuando la clnica est cerrada y que no puede esperar hasta el siguiente da hbil, puede llamar/localizar a su doctor(a) al nmero que aparece a  continuacin.   Por favor, tenga en cuenta que aunque hacemos todo lo posible para estar disponibles para asuntos urgentes fuera del horario de [place], no estamos disponibles las 24 horas del da, los 7 [address]  [address] de la [place].   Si tiene un problema urgente y no puede comunicarse con nosotros, puede optar por buscar  atencin mdica  en el consultorio de su doctor(a), en una clnica privada, en un centro de atencin urgente o en una sala de emergencias.  Si tiene Engineer, drilling, por favor llame inmediatamente al 911 o vaya a la sala de emergencias.  Nmeros de bper  - Dr. Gwen Pounds: [phone]  - Dra. Roseanne Reno: [phone]  - Dr. Katrinka Blazing: [phone]   En caso de inclemencias del tiempo, por favor llame a Lacy Duverney principal al [phone] para una actualizacin sobre el [place] de cualquier retraso o cierre.  Consejos para la medicacin en dermatologa: Por favor, guarde las cajas en las que vienen los medicamentos de uso tpico para ayudarle a seguir las instrucciones sobre dnde y cmo usarlos. Las farmacias generalmente imprimen las instrucciones del medicamento slo en las cajas y no directamente en los tubos del [place].   Si su medicamento es muy caro, por favor, pngase en contacto con Rolm Gala llamando al [phone] y presione la opcin 4 o envenos un mensaje a travs de Clinical cytogeneticist.   No podemos decirle cul ser su copago por los medicamentos por adelantado ya que esto es diferente dependiendo de la cobertura de su seguro. Sin embargo, es posible que podamos encontrar un medicamento sustituto a Audiological scientist un formulario para que el seguro cubra el medicamento que se considera necesario.   Si se requiere una autorizacin previa para que su compaa de seguros [place] su medicamento, por favor permtanos de 1 a 2 das hbiles para completar [address]  Los precios de los medicamentos varan con frecuencia dependiendo del Environmental consultant de dnde se surte la receta  y alguna farmacias pueden ofrecer precios ms baratos.  El sitio web www.goodrx.com tiene cupones para medicamentos de Health and safety inspector. Los precios aqu no tienen en cuenta lo que podra costar con la ayuda del seguro (puede ser ms barato con su seguro), pero el sitio web puede darle el precio si no utiliz Tourist information centre manager.  - Puede imprimir el cupn correspondiente y llevarlo con su receta a la farmacia.  - Tambin puede pasar por nuestra oficina durante el horario de atencin regular y Education officer, museum una tarjeta de cupones de GoodRx.  - Si necesita que su receta se enve electrnicamente a una farmacia diferente, informe a nuestra oficina a travs de MyChart de [place] o por telfono llamando al [phone] y presione la opcin 4.

## 2023-02-06 LAB — SURGICAL PATHOLOGY

## 2023-02-07 ENCOUNTER — Telehealth: Payer: Self-pay

## 2023-02-07 NOTE — Telephone Encounter (Signed)
-----   Message from Willeen Niece sent at 02/06/2023  6:39 PM EST ----- 1. Skin, left dorsal wrist :       PIGMENTED SEBORRHEIC KERATOSIS   Benign age related growth - please call patient

## 2023-02-07 NOTE — Telephone Encounter (Signed)
Left message for patient advising biopsy of the left dorsal wrist was a benign SK and no further treatment needed.

## 2023-02-22 DIAGNOSIS — E119 Type 2 diabetes mellitus without complications: Secondary | ICD-10-CM | POA: Diagnosis not present

## 2023-03-01 ENCOUNTER — Encounter: Payer: Self-pay | Admitting: Internal Medicine

## 2023-03-01 MED ORDER — HYDROCORTISONE ACETATE 25 MG RE SUPP: 25.0000 mg | Freq: Two times a day (BID) | RECTAL | 0 refills | Status: DC | PRN

## 2023-03-01 NOTE — Telephone Encounter (Signed)
 Rx sent in for suppositories.

## 2023-03-01 NOTE — Telephone Encounter (Signed)
 Ok to refill for her before she goes out of town?

## 2023-03-02 ENCOUNTER — Other Ambulatory Visit: Payer: Self-pay

## 2023-03-02 MED ORDER — HYDRALAZINE HCL 25 MG PO TABS: 25.0000 mg | ORAL_TABLET | Freq: Three times a day (TID) | ORAL | 1 refills | Status: DC

## 2023-03-02 MED ORDER — CARVEDILOL 12.5 MG PO TABS: 12.5000 mg | ORAL_TABLET | Freq: Two times a day (BID) | ORAL | 1 refills | Status: DC

## 2023-03-17 ENCOUNTER — Ambulatory Visit: Payer: BC Managed Care – PPO | Attending: Internal Medicine | Admitting: Internal Medicine

## 2023-03-17 ENCOUNTER — Encounter: Payer: Self-pay | Admitting: Internal Medicine

## 2023-03-17 VITALS — BP 166/108 | HR 75 | Ht 66.0 in | Wt 265.2 lb

## 2023-03-17 DIAGNOSIS — I480 Paroxysmal atrial fibrillation: Secondary | ICD-10-CM | POA: Diagnosis not present

## 2023-03-17 DIAGNOSIS — R197 Diarrhea, unspecified: Secondary | ICD-10-CM

## 2023-03-17 DIAGNOSIS — I10 Essential (primary) hypertension: Secondary | ICD-10-CM

## 2023-03-17 DIAGNOSIS — Z79899 Other long term (current) drug therapy: Secondary | ICD-10-CM

## 2023-03-17 DIAGNOSIS — G4733 Obstructive sleep apnea (adult) (pediatric): Secondary | ICD-10-CM

## 2023-03-17 NOTE — Progress Notes (Signed)
Cardiology Office Note:  .   Date:  03/17/2023  ID:  Joy Houston, DOB [birth year], MRN [id number] PCP: Dale [place], MD  [facility] Providers Cardiologist:  Parke Poisson, MD    History of Present Illness: Joy Houston Kitchen   Joy Houston is a 58 y.o. female.  Discussed the use of AI scribe software for clinical note transcription with the patient, who gave verbal consent to proceed.  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 The patient reports that the diarrhea has been ongoing for years and was recently diagnosed with parasites, specifically Giardia and a tapeworm per her report. The patient has undergone three rounds of treatment for the parasites and is due for a stool test in forty days to assess the need for further treatment.  The patient also reports having methylation issues, which she believes are contributing to her health problems by preventing proper nutrient absorption. The patient has been taking magnesium for leg cramps, which she reports occur mainly in the left lower leg. The patient also mentions experiencing visual disturbances, described as looking through a pond, which last about 10-15 minutes. 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  The patient's blood pressure has been periodically high for a while now, despite being on carvedilol and hydralazine. The patient reports feeling a little dizzy when changing positions, which she attributes to the increase in carvedilol dosage. The patient also mentions significant weight gain after stopping low dose naltrexone.  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 The patient expresses a desire to make lifestyle changes,  including walking more and making dietary changes, to help manage her health issues.        ROS: negative except per HPI above.  Studies Reviewed: Joy Houston Kitchen   EKG Interpretation Date/Time:  Friday March 17 2023 10:54:36 EST Ventricular Rate:  75 PR Interval:  162 QRS Duration:  76 QT Interval:  396 QTC Calculation: 442 R Axis:   -4  Text Interpretation: Normal sinus rhythm Minimal voltage criteria for LVH, may be normal variant ( R in aVL ) Confirmed by Weston Brass ([id number]) on 03/17/2023 11:09:44 AM    Results   DIAGNOSTIC Echocardiogram: Left ventricular hypertrophy, borderline EKG: Mild left ventricular hypertrophy     Risk Assessment/Calculations:    CHA2DS2-VASc Score = 3   This indicates a 3.2% annual risk of stroke. The patient's score is based upon: CHF History: 0 HTN History: 1 Diabetes History: 1 Stroke History: 0 Vascular Disease History: 0 Age Score: 0 Gender Score: 1  Physical Exam:   VS:  BP (!) 166/108   Pulse 75   Ht 5\' 6"  (1.676 m)   Wt 265 lb 3.2 oz (120.3 kg)   SpO2 96%   BMI 42.80 kg/m    Wt Readings from Last 3 Encounters:  03/17/23 265 lb 3.2 oz (120.3 kg)  01/10/23 260 lb 3.2 oz (118 kg)  11/16/22 254 lb (115.2 kg)     Physical Exam   VITALS: BP- 166/100 CARDIOVASCULAR: Faint systolic murmur.     GEN: Well nourished, well developed in no acute distress NECK: No JVD; No carotid bruits CARDIAC: RRR RESPIRATORY:  Clear to auscultation without rales, wheezing or rhonchi  ABDOMEN: Soft,  non-tender, non-distended EXTREMITIES:  No edema; No deformity   ASSESSMENT AND PLAN: .    Assessment & Plan Primary hypertension  Paroxysmal A-fib (HCC)  OSA (obstructive sleep apnea)  Medication management  Diarrhea, unspecified type   Assessment and Plan    Hypertension Elevated blood pressure readings, currently on Carvedilol 12.5mg  twice daily and Hydralazine 25mg  three times daily. Patient reports occasional dizziness with positional  changes. Mild left ventricular hypertrophy noted on previous echocardiogram. Attributes her HTN to stress of GI illnesses. -Continue current medications per patient preference. -Add PRN Hydralazine 25mg  for blood pressure >160/100. -Refer to advanced hypertension clinic for further management. -Check blood pressure at home and during clinical work.  Weight Gain Recent significant weight gain, possibly related to stress and health issues. -Encourage regular exercise, such as walking and light strength training.

## 2023-03-17 NOTE — Patient Instructions (Addendum)
Medication Instructions:  Your physician recommends that you continue on your current medications as directed. Please refer to the Current Medication list given to you today.   *It is okay for you to take Hydralazine 25mg  as needed 1-2 times per Day for a Blood Pressure greater than 160/100, in addition to what you take daily (25 mg three times per day)  Lab Work: None  Testing/Procedures: None  Follow-Up: At [facility], you and your health needs are our priority.  As part of our continuing mission to provide you with exceptional heart care, we have created designated Provider Care Teams.  These Care Teams include your primary Cardiologist (physician) and Advanced Practice Providers (APPs -  Physician Assistants and Nurse Practitioners) who all work together to provide you with the care you need, when you need it.   Your next appointment:   6 month(s)  Provider:   Parke Poisson, MD

## 2023-03-25 DIAGNOSIS — E119 Type 2 diabetes mellitus without complications: Secondary | ICD-10-CM | POA: Diagnosis not present

## 2023-03-30 ENCOUNTER — Ambulatory Visit: Payer: BC Managed Care – PPO | Admitting: Internal Medicine

## 2023-03-30 ENCOUNTER — Encounter: Payer: Self-pay | Admitting: Internal Medicine

## 2023-03-30 VITALS — BP 148/88 | HR 88 | Temp 97.9°F | Resp 16 | Ht 66.0 in | Wt 264.0 lb

## 2023-03-30 DIAGNOSIS — E782 Mixed hyperlipidemia: Secondary | ICD-10-CM

## 2023-03-30 DIAGNOSIS — E559 Vitamin D deficiency, unspecified: Secondary | ICD-10-CM | POA: Diagnosis not present

## 2023-03-30 DIAGNOSIS — I10 Essential (primary) hypertension: Secondary | ICD-10-CM | POA: Diagnosis not present

## 2023-03-30 DIAGNOSIS — E538 Deficiency of other specified B group vitamins: Secondary | ICD-10-CM | POA: Diagnosis not present

## 2023-03-30 DIAGNOSIS — E1165 Type 2 diabetes mellitus with hyperglycemia: Secondary | ICD-10-CM

## 2023-03-30 DIAGNOSIS — R197 Diarrhea, unspecified: Secondary | ICD-10-CM

## 2023-03-30 DIAGNOSIS — R7989 Other specified abnormal findings of blood chemistry: Secondary | ICD-10-CM

## 2023-03-30 DIAGNOSIS — I480 Paroxysmal atrial fibrillation: Secondary | ICD-10-CM

## 2023-03-30 DIAGNOSIS — F439 Reaction to severe stress, unspecified: Secondary | ICD-10-CM

## 2023-03-30 DIAGNOSIS — R252 Cramp and spasm: Secondary | ICD-10-CM | POA: Diagnosis not present

## 2023-03-30 DIAGNOSIS — R945 Abnormal results of liver function studies: Secondary | ICD-10-CM

## 2023-03-30 DIAGNOSIS — K760 Fatty (change of) liver, not elsewhere classified: Secondary | ICD-10-CM

## 2023-03-30 LAB — CBC WITH DIFFERENTIAL/PLATELET
Basophils Absolute: 0.1 10*3/uL (ref 0.0–0.1)
Basophils Relative: 0.7 % (ref 0.0–3.0)
Eosinophils Absolute: 0.1 10*3/uL (ref 0.0–0.7)
Eosinophils Relative: 1.3 % (ref 0.0–5.0)
HCT: 42.8 % (ref 36.0–46.0)
Hemoglobin: 14.5 g/dL (ref 12.0–15.0)
Lymphocytes Relative: 26 % (ref 12.0–46.0)
Lymphs Abs: 1.9 10*3/uL (ref 0.7–4.0)
MCHC: 33.9 g/dL (ref 30.0–36.0)
MCV: 94.9 fL (ref 78.0–100.0)
Monocytes Absolute: 0.7 10*3/uL (ref 0.1–1.0)
Monocytes Relative: 8.8 % (ref 3.0–12.0)
Neutro Abs: 4.8 10*3/uL (ref 1.4–7.7)
Neutrophils Relative %: 63.2 % (ref 43.0–77.0)
Platelets: 265 10*3/uL (ref 150.0–400.0)
RBC: 4.51 Mil/uL (ref 3.87–5.11)
RDW: 13.2 % (ref 11.5–15.5)
WBC: 7.5 10*3/uL (ref 4.0–10.5)

## 2023-03-30 LAB — VITAMIN D 25 HYDROXY (VIT D DEFICIENCY, FRACTURES): VITD: 25.47 ng/mL — ABNORMAL LOW (ref 30.00–100.00)

## 2023-03-30 LAB — TSH: TSH: 5.78 u[IU]/mL — ABNORMAL HIGH (ref 0.35–5.50)

## 2023-03-30 LAB — BASIC METABOLIC PANEL
BUN: 13 mg/dL (ref 6–23)
CO2: 24 meq/L (ref 19–32)
Calcium: 9.2 mg/dL (ref 8.4–10.5)
Chloride: 105 meq/L (ref 96–112)
Creatinine, Ser: 0.71 mg/dL (ref 0.40–1.20)
GFR: 95.57 mL/min (ref >60.00)
Glucose, Bld: 120 mg/dL — ABNORMAL HIGH (ref 70–99)
Potassium: 4.1 meq/L (ref 3.5–5.1)
Sodium: 138 meq/L (ref 135–145)

## 2023-03-30 LAB — HEPATIC FUNCTION PANEL
ALT: 45 U/L — ABNORMAL HIGH (ref 0–35)
AST: 31 U/L (ref 0–37)
Albumin: 4.1 g/dL (ref 3.5–5.2)
Alkaline Phosphatase: 75 U/L (ref 39–117)
Bilirubin, Direct: 0.1 mg/dL (ref 0.0–0.3)
Total Bilirubin: 0.4 mg/dL (ref 0.2–1.2)
Total Protein: 7.4 g/dL (ref 6.0–8.3)

## 2023-03-30 LAB — MICROALBUMIN / CREATININE URINE RATIO
Creatinine,U: 93.6 mg/dL
Microalb Creat Ratio: 1.2 mg/g (ref 0.0–30.0)
Microalb, Ur: 1.2 mg/dL (ref 0.0–1.9)

## 2023-03-30 LAB — MAGNESIUM: Magnesium: 1.8 mg/dL (ref 1.5–2.5)

## 2023-03-30 LAB — VITAMIN B12: Vitamin B-12: 442 pg/mL (ref 211–911)

## 2023-03-30 NOTE — Progress Notes (Signed)
 Subjective:    Patient ID: Joy Houston, female    DOB: 27-Jul-1965, 58 y.o.   MRN: [id number]  Patient here for  Chief Complaint  Patient presents with   Medical Management of Chronic Issues    HPI Here for a scheduled follow up - f/u regarding her blood pressure, blood sugar and loose stool. Also with history of afib. S/p ablation. Off xarelto. Taking carvedilol . Had f/u with cardiology 03/17/23 - recommended continuing current carvedilol  and hydralazine . Did add prn hydralazine  25mg  . Blood pressure remains elevated. Discussed today. Discussed diet,, exercise and weight loss. Has had persistent issues with loose stool as outlined in previous notes. Extensive w/up as outlined. Have recommended colestid. Has declined to take. Discussed again today. She sees Dr Trudy and states he recently diagnosed her with giardia and tapeworm. Previous stool studies here and through GI negative. She is currently on flagyl and biltricide. Taking intermittent doses. Still with persistent loose stool. Eating. Discussed increased stress.    Past Medical History:  Diagnosis Date   Anemia    Angio-edema    Atrial fibrillation (HCC)    Complication of anesthesia    Diabetes mellitus without complication (HCC)    diet controlled   Dysplastic nevus 06/27/2018   Left upper back. Moderate atypia, limited margins free.    Eczema    Epstein Barr infection    Family history of anesthesia complication    Mother - confusion   Fatty liver    Heart murmur    Slight - nothing to worry about   Hemorrhoid    Hypertension    Hypothyroidism    PONV (postoperative nausea and vomiting)    only with c-sections   Sleep apnea    mild   Past Surgical History:  Procedure Laterality Date   CESAREAN SECTION     CHOLECYSTECTOMY N/A 09/07/2018   Procedure: LAPAROSCOPIC CHOLECYSTECTOMY;  Surgeon: Rodolph Romano, MD;  Location: [facility] ORS;  Service: General;  Laterality: N/A;   FINGER SURGERY     pin to  3rd finger Right hand   LUMBAR LAMINECTOMY/DECOMPRESSION MICRODISCECTOMY  12/15/2011   Procedure: LUMBAR LAMINECTOMY/DECOMPRESSION MICRODISCECTOMY 1 LEVEL;  Surgeon: Reyes JONETTA Budge, MD;  Location: [facility];  Service: Neurosurgery;  Laterality: Right;  RIGHT Lumbar four-Five diskectomy   LUMBAR LAMINECTOMY/DECOMPRESSION MICRODISCECTOMY N/A 11/04/2019   Procedure: MICRODISCECTOMY LEFT LUMBAR FIVE SACRAL ONE;  Surgeon: Budge Reyes, MD;  Location: [facility] OR;  Service: Neurosurgery;  Laterality: N/A;  3C   MICRODISCECTOMY LUMBAR     L4-5   WISDOM TOOTH EXTRACTION     Family History  Problem Relation Age of Onset   GER disease Mother    Diabetes Father    Cancer Father        Melanoma, Prostate   Heart disease Father    Heart disease Maternal Grandfather    Diabetes Paternal Grandfather    Breast cancer Sister 22   Social History   Socioeconomic History   Marital status: Married    Spouse name: Not on file   Number of children: 2   Years of education: Not on file   Highest education level: Bachelor's degree (e.g., BA, AB, BS)  Occupational History    Employer: [employer]  Tobacco Use   Smoking status: Never   Smokeless tobacco: Never  Vaping Use   Vaping status: Never Used  Substance and Sexual Activity   Alcohol use: Not Currently    Alcohol/week: 0.0 standard drinks of alcohol   Drug  use: No   Sexual activity: Yes    Partners: Male    Comment: married  Other Topics Concern   Not on file  Social History Narrative   Nurse    Married   Social Drivers of Health   Financial Resource Strain: Low Risk  (07/28/2022)   Overall Financial Resource Strain (CARDIA)    Difficulty of Paying Living Expenses: Not hard at all  Food Insecurity: No Food Insecurity (07/28/2022)   Hunger Vital Sign    Worried About Running Out of Food in the Last Year: Never true    Ran Out of Food in the Last Year: Never true  Transportation Needs: No Transportation Needs (07/28/2022)    PRAPARE - Administrator, Civil Service (Medical): No    Lack of Transportation (Non-Medical): No  Physical Activity: Unknown (07/28/2022)   Exercise Vital Sign    Days of Exercise per Week: Patient declined    Minutes of Exercise per Session: Not on file  Stress: Patient Declined (07/28/2022)   [employer] of Occupational Health - Occupational Stress Questionnaire    Feeling of Stress : Patient declined  Social Connections: Socially Integrated (07/28/2022)   Social Connection and Isolation Panel [NHANES]    Frequency of Communication with Friends and Family: More than three times a week    Frequency of Social Gatherings with Friends and Family: More than three times a week    Attends Religious Services: More than 4 times per year    Active Member of [employer] or Organizations: Yes    Attends Engineer, Structural: More than 4 times per year    Marital Status: Married     Review of Systems  Constitutional:  Negative for appetite change, fever and unexpected weight change.  HENT:  Negative for congestion and sinus pressure.   Respiratory:  Negative for cough, chest tightness and shortness of breath.   Cardiovascular:  Negative for chest pain and palpitations.  Gastrointestinal:  Positive for diarrhea. Negative for nausea and vomiting.       No increased abdominal pain.   Genitourinary:  Negative for difficulty urinating and dysuria.  Musculoskeletal:  Negative for joint swelling and myalgias.  Skin:  Negative for color change and rash.  Neurological:  Negative for dizziness and headaches.  Psychiatric/Behavioral:  Negative for agitation and dysphoric mood.        Objective:     BP (!) 148/88   Pulse 88   Temp 97.9 F (36.6 C)   Resp 16   Ht 5' 6 (1.676 m)   Wt 264 lb (119.7 kg)   SpO2 99%   BMI 42.61 kg/m  Wt Readings from Last 3 Encounters:  03/30/23 264 lb (119.7 kg)  03/17/23 265 lb 3.2 oz (120.3 kg)  01/10/23 260 lb 3.2 oz (118 kg)    Physical  Exam Vitals reviewed.  Constitutional:      General: She is not in acute distress.    Appearance: Normal appearance.  HENT:     Head: Normocephalic and atraumatic.     Right Ear: External ear normal.     Left Ear: External ear normal.     Mouth/Throat:     Pharynx: No oropharyngeal exudate or posterior oropharyngeal erythema.  Eyes:     General: No scleral icterus.       Right eye: No discharge.        Left eye: No discharge.     Conjunctiva/sclera: Conjunctivae normal.  Neck:  Thyroid : No thyromegaly.  Cardiovascular:     Rate and Rhythm: Normal rate and regular rhythm.  Pulmonary:     Effort: No respiratory distress.     Breath sounds: Normal breath sounds. No wheezing.  Abdominal:     General: Bowel sounds are normal.     Palpations: Abdomen is soft.     Tenderness: There is no abdominal tenderness.  Musculoskeletal:        General: No swelling or tenderness.     Cervical back: Neck supple. No tenderness.  Lymphadenopathy:     Cervical: No cervical adenopathy.  Skin:    Findings: No erythema or rash.  Neurological:     Mental Status: She is alert.  Psychiatric:        Mood and Affect: Mood normal.        Behavior: Behavior normal.         Outpatient Encounter Medications as of 03/30/2023  Medication Sig   carvedilol  (COREG ) 12.5 MG tablet Take 1 tablet (12.5 mg total) by mouth 2 (two) times daily with a meal.   EPINEPHrine  (EPIPEN  2-PAK) 0.3 mg/0.3 mL IJ SOAJ injection Inject 0.3 mg into the muscle as needed for anaphylaxis.   hydrALAZINE  (APRESOLINE ) 25 MG tablet Take 1 tablet (25 mg total) by mouth 3 (three) times daily.   hydrocortisone  (ANUSOL -HC) 25 MG suppository Place 1 suppository (25 mg total) rectally 2 (two) times daily as needed for hemorrhoids or anal itching.   hydrocortisone  2.5 % cream Apply twice a day for up to 2 weeks as needed for rash.   ketoconazole  (NIZORAL ) 2 % cream Apply twice a day as needed for rash.   Magnesium Bisglycinate Dihyd  POWD Take 400 mg by mouth See admin instructions. Pt takes 2-4 times per week   VITAMIN D  PO Take by mouth.   No facility-administered encounter medications on file as of 03/30/2023.     Lab Results  Component Value Date   WBC 7.5 03/30/2023   HGB 14.5 03/30/2023   HCT 42.8 03/30/2023   PLT 265.0 03/30/2023   GLUCOSE 120 (H) 03/30/2023   CHOL 162 11/16/2022   TRIG 69.0 11/16/2022   HDL 62.40 11/16/2022   LDLDIRECT 145.4 03/01/2013   LDLCALC 86 11/16/2022   ALT 45 (H) 03/30/2023   AST 31 03/30/2023   NA 138 03/30/2023   K 4.1 03/30/2023   CL 105 03/30/2023   CREATININE 0.71 03/30/2023   BUN 13 03/30/2023   CO2 24 03/30/2023   TSH 5.78 (H) 03/30/2023   HGBA1C 5.7 11/16/2022   MICROALBUR 1.2 03/30/2023       Assessment & Plan:  Type 2 diabetes mellitus with hyperglycemia, without long-term current use of insulin  (HCC) Assessment & Plan: Continue low carb diet and exercise.  Follow met b and a1c.  Discussed diet and exercise today.  Lab Results  Component Value Date   HGBA1C 5.7 11/16/2022    Orders: -     Basic metabolic panel -     Microalbumin / creatinine urine ratio  Hyperlipidemia, mixed Assessment & Plan: Low cholesterol diet and exercise.  Follow lipid panel.   Orders: -     Hepatic function panel -     CBC with Differential/Platelet -     TSH  Primary hypertension Assessment & Plan: Blood pressure remains elevated. Currently on carvedilol  and hydralazine .  Taking hydralazine  25mg  tid. Blood pressure elevated today.  Still elevated above goal.  Have discussed treatment options.  Allegic to ACE inhibitor.  Was on hctz when had angioedema.  Intolerance to amlodipine .  On carvedilol  to 12.5mg  bid. Follow pressures. Follow metabolic panel. Had hydralzine to take prn.    B12 deficiency Assessment & Plan: Check B12 level.   Orders: -     Vitamin B12  Vitamin D  deficiency Assessment & Plan: Check vitamin D  level today.   Orders: -     VITAMIN D  25  Hydroxy (Vit-D Deficiency, Fractures)  Leg cramps -     Magnesium  Elevated TSH Assessment & Plan: Slightly elevated tsh.  Free T3 and Free T4 wnl.  Recheck tsh today.   Orders: -     T4, free  Abnormal liver function Assessment & Plan: Diet and exercise and weight loss. Follow liver panel.    Diarrhea, unspecified type Assessment & Plan: Previous stool studies negative.  She has had persistent problems with diarrhea. (Ongoing for years). Symptoms seem to have been more noticeable after cholecystectomy 08/2018.  Work up to date has included - TTgIGA negative.  Lipase wnl.  CT abd/pelvis - diverticulosis, otherwise negative. Colonoscopy negative for microscopic colitis.  Previous TSH/T4 negative.  No weight loss.  Eating.  No nausea or vomiting.   Have discussed history of loose stools.  Declines cholestyramine. Recent increase in stools. Notify me if changes her mind about starting coloestid. Has seen Dr Trudy recently. Diagnosed with giardia and a tape worm. Currently on flagyl and biltricide.    Fatty liver Assessment & Plan: Check liver panel. Diet, exercise and weight loss. Follow liver panel.    Paroxysmal A-fib [facility]) Assessment & Plan: S/p ablation.  Off xarelto.  Afib/symptoms improved overall.  Since being on carvedilol , it appears that palpitations symptoms have improved. Follow pressures. Followed by cardiology.   Stress Assessment & Plan:   Increased stress with husband's health issues.  Appears to be handling things ok.  Follow.       Allena Hamilton, MD

## 2023-03-31 ENCOUNTER — Other Ambulatory Visit: Payer: Self-pay | Admitting: Internal Medicine

## 2023-03-31 ENCOUNTER — Ambulatory Visit (INDEPENDENT_AMBULATORY_CARE_PROVIDER_SITE_OTHER): Payer: BC Managed Care – PPO

## 2023-03-31 ENCOUNTER — Encounter: Payer: Self-pay | Admitting: Internal Medicine

## 2023-03-31 DIAGNOSIS — R7989 Other specified abnormal findings of blood chemistry: Secondary | ICD-10-CM

## 2023-03-31 DIAGNOSIS — R946 Abnormal results of thyroid function studies: Secondary | ICD-10-CM

## 2023-03-31 LAB — T4, FREE: Free T4: 0.69 ng/dL (ref 0.60–1.60)

## 2023-03-31 NOTE — Progress Notes (Signed)
Order placed for add on lab.  °

## 2023-03-31 NOTE — Telephone Encounter (Signed)
 I already added Free T4 to her labs.  See result note.

## 2023-04-02 ENCOUNTER — Encounter: Payer: Self-pay | Admitting: Internal Medicine

## 2023-04-02 NOTE — Assessment & Plan Note (Signed)
 Check liver panel. Diet, exercise and weight loss. Follow liver panel.

## 2023-04-02 NOTE — Assessment & Plan Note (Signed)
 Check vitamin D level today

## 2023-04-02 NOTE — Assessment & Plan Note (Signed)
 Slightly elevated tsh.  Free T3 and Free T4 wnl.  Recheck tsh today.

## 2023-04-02 NOTE — Assessment & Plan Note (Signed)
 Previous stool studies negative.  She has had persistent problems with diarrhea. (Ongoing for years). Symptoms seem to have been more noticeable after cholecystectomy 08/2018.  Work up to date has included - TTgIGA negative.  Lipase wnl.  CT abd/pelvis - diverticulosis, otherwise negative. Colonoscopy negative for microscopic colitis.  Previous TSH/T4 negative.  No weight loss.  Eating.  No nausea or vomiting.   Have discussed history of loose stools.  Declines cholestyramine. Recent increase in stools. Notify me if changes her mind about starting coloestid. Has seen Dr Trudy recently. Diagnosed with giardia and a tape worm. Currently on flagyl and biltricide.

## 2023-04-02 NOTE — Assessment & Plan Note (Signed)
Diet and exercise and weight loss.  Follow liver panel.

## 2023-04-02 NOTE — Assessment & Plan Note (Signed)
 S/p ablation.  Off xarelto.  Afib/symptoms improved overall.  Since being on carvedilol , it appears that palpitations symptoms have improved. Follow pressures. Followed by cardiology.

## 2023-04-02 NOTE — Assessment & Plan Note (Signed)
 Blood pressure remains elevated. Currently on carvedilol  and hydralazine .  Taking hydralazine  25mg  tid. Blood pressure elevated today.  Still elevated above goal.  Have discussed treatment options.  Allegic to ACE inhibitor.  Was on hctz when had angioedema.  Intolerance to amlodipine .  On carvedilol  to 12.5mg  bid. Follow pressures. Follow metabolic panel. Had hydralzine to take prn.

## 2023-04-02 NOTE — Assessment & Plan Note (Signed)
 Check B12 level.

## 2023-04-02 NOTE — Assessment & Plan Note (Signed)
 Continue low carb diet and exercise.  Follow met b and a1c.  Discussed diet and exercise today.  Lab Results  Component Value Date   HGBA1C 5.7 11/16/2022

## 2023-04-02 NOTE — Assessment & Plan Note (Signed)
 Low cholesterol diet and exercise.  Follow lipid panel.

## 2023-04-02 NOTE — Assessment & Plan Note (Signed)
 Increased stress with husband's health issues.  Appears to be handling things ok. Follow.

## 2023-04-13 ENCOUNTER — Ambulatory Visit: Payer: BC Managed Care – PPO | Admitting: Internal Medicine

## 2023-04-22 DIAGNOSIS — E119 Type 2 diabetes mellitus without complications: Secondary | ICD-10-CM | POA: Diagnosis not present

## 2023-04-27 ENCOUNTER — Encounter: Payer: Self-pay | Admitting: Internal Medicine

## 2023-04-27 ENCOUNTER — Ambulatory Visit: Payer: BC Managed Care – PPO | Admitting: Internal Medicine

## 2023-04-27 VITALS — BP 144/90 | HR 78 | Temp 98.0°F

## 2023-04-27 DIAGNOSIS — I480 Paroxysmal atrial fibrillation: Secondary | ICD-10-CM

## 2023-04-27 DIAGNOSIS — E559 Vitamin D deficiency, unspecified: Secondary | ICD-10-CM

## 2023-04-27 DIAGNOSIS — R945 Abnormal results of liver function studies: Secondary | ICD-10-CM

## 2023-04-27 DIAGNOSIS — I10 Essential (primary) hypertension: Secondary | ICD-10-CM

## 2023-04-27 DIAGNOSIS — E1165 Type 2 diabetes mellitus with hyperglycemia: Secondary | ICD-10-CM

## 2023-04-27 DIAGNOSIS — E782 Mixed hyperlipidemia: Secondary | ICD-10-CM

## 2023-04-27 NOTE — Progress Notes (Deleted)
 Subjective:    Patient ID: Joy Houston, female    DOB: Jun 22, 1965, 58 y.o.   MRN: [id number]  Patient here for No chief complaint on file.   HPI Here for a scheduled follow up - f/u regarding her blood pressure, blood sugar and loose stool. Also with history of afib. S/p ablation. Off xarelto. Taking carvedilol. Had f/u with cardiology 03/17/23 - recommended continuing current carvedilol and hydralazine. Did add prn hydralazine 25mg  . She sees Dr Mayford Knife and was recently diagnosed her with giardia and tapeworm. Previous stool studies here and through GI negative. She is currently on flagyl and biltricide. Taking intermittent doses.    Past Medical History:  Diagnosis Date   Anemia    Angio-edema    Atrial fibrillation (HCC)    Complication of anesthesia    Diabetes mellitus without complication (HCC)    diet controlled   Dysplastic nevus 06/27/2018   Left upper back. Moderate atypia, limited margins free.    Eczema    Epstein Barr infection    Family history of anesthesia complication    Mother - confusion   Fatty liver    Heart murmur    Slight - "nothing to worry about"   Hemorrhoid    Hypertension    Hypothyroidism    PONV (postoperative nausea and vomiting)    "only with c-sections"   Sleep apnea    mild   Past Surgical History:  Procedure Laterality Date   CESAREAN SECTION     CHOLECYSTECTOMY N/A 09/07/2018   Procedure: LAPAROSCOPIC CHOLECYSTECTOMY;  Surgeon: Carolan Shiver, MD;  Location: [facility] ORS;  Service: General;  Laterality: N/A;   FINGER SURGERY     pin to 3rd finger Right hand   LUMBAR LAMINECTOMY/DECOMPRESSION MICRODISCECTOMY  12/15/2011   Procedure: LUMBAR LAMINECTOMY/DECOMPRESSION MICRODISCECTOMY 1 LEVEL;  Surgeon: Cristi Loron, MD;  Location: [facility];  Service: Neurosurgery;  Laterality: Right;  RIGHT Lumbar four-Five diskectomy   LUMBAR LAMINECTOMY/DECOMPRESSION MICRODISCECTOMY N/A 11/04/2019   Procedure: MICRODISCECTOMY LEFT  LUMBAR FIVE SACRAL ONE;  Surgeon: Tressie Stalker, MD;  Location: [facility] OR;  Service: Neurosurgery;  Laterality: N/A;  3C   MICRODISCECTOMY LUMBAR     L4-5   WISDOM TOOTH EXTRACTION     Family History  Problem Relation Age of Onset   GER disease Mother    Diabetes Father    Cancer Father        Melanoma, Prostate   Heart disease Father    Heart disease Maternal Grandfather    Diabetes Paternal Grandfather    Breast cancer Sister 76   Social History   Socioeconomic History   Marital status: Married    Spouse name: Not on file   Number of children: 2   Years of education: Not on file   Highest education level: Bachelor's degree (e.g., BA, AB, BS)  Occupational History    Employer:  [employer]  Tobacco Use   Smoking status: Never   Smokeless tobacco: Never  Vaping Use   Vaping status: Never Used  Substance and Sexual Activity   Alcohol use: Not Currently    Alcohol/week: 0.0 standard drinks of alcohol   Drug use: No   Sexual activity: Yes    Partners: Male    Comment: married  Other Topics Concern   Not on file  Social History Narrative   Nurse    Married   Social Drivers of Health   Financial Resource Strain: Low Risk  (04/24/2023)   Overall Physicist, medical Strain (  CARDIA)    Difficulty of Paying Living Expenses: Not hard at all  Food Insecurity: No Food Insecurity (04/24/2023)   Hunger Vital Sign    Worried About Running Out of Food in the Last Year: Never true    Ran Out of Food in the Last Year: Never true  Transportation Needs: No Transportation Needs (04/24/2023)   PRAPARE - Administrator, Civil Service (Medical): No    Lack of Transportation (Non-Medical): No  Physical Activity: Unknown (04/24/2023)   Exercise Vital Sign    Days of Exercise per Week: Patient declined    Minutes of Exercise per Session: Not on file  Stress: Patient Declined (04/24/2023)   [employer] of Occupational Health - Occupational Stress Questionnaire    Feeling  of Stress : Patient declined  Social Connections: Socially Integrated (04/24/2023)   Social Connection and Isolation Panel [NHANES]    Frequency of Communication with Friends and Family: More than three times a week    Frequency of Social Gatherings with Friends and Family: More than three times a week    Attends Religious Services: More than 4 times per year    Active Member of [employer] or Organizations: Yes    Attends Engineer, structural: More than 4 times per year    Marital Status: Married     Review of Systems     Objective:     There were no vitals taken for this visit. Wt Readings from Last 3 Encounters:  03/30/23 264 lb (119.7 kg)  03/17/23 265 lb 3.2 oz (120.3 kg)  01/10/23 260 lb 3.2 oz (118 kg)    Physical Exam  {Perform Simple Foot Exam  Perform Detailed exam:1} {Insert foot Exam (Optional):30965}   Outpatient Encounter Medications as of 04/27/2023  Medication Sig   carvedilol (COREG) 12.5 MG tablet Take 1 tablet (12.5 mg total) by mouth 2 (two) times daily with a meal.   EPINEPHrine (EPIPEN 2-PAK) 0.3 mg/0.3 mL IJ SOAJ injection Inject 0.3 mg into the muscle as needed for anaphylaxis.   hydrALAZINE (APRESOLINE) 25 MG tablet Take 1 tablet (25 mg total) by mouth 3 (three) times daily.   hydrocortisone (ANUSOL-HC) 25 MG suppository Place 1 suppository (25 mg total) rectally 2 (two) times daily as needed for hemorrhoids or anal itching.   hydrocortisone 2.5 % cream Apply twice a day for up to 2 weeks as needed for rash.   ketoconazole (NIZORAL) 2 % cream Apply twice a day as needed for rash.   Magnesium Bisglycinate Dihyd POWD Take 400 mg by mouth See admin instructions. Pt takes 2-4 times per week   VITAMIN D PO Take by mouth.   No facility-administered encounter medications on file as of 04/27/2023.     Lab Results  Component Value Date   WBC 7.5 03/30/2023   HGB 14.5 03/30/2023   HCT 42.8 03/30/2023   PLT 265.0 03/30/2023   GLUCOSE 120 (H) 03/30/2023    CHOL 162 11/16/2022   TRIG 69.0 11/16/2022   HDL 62.40 11/16/2022   LDLDIRECT 145.4 03/01/2013   LDLCALC 86 11/16/2022   ALT 45 (H) 03/30/2023   AST 31 03/30/2023   NA 138 03/30/2023   K 4.1 03/30/2023   CL 105 03/30/2023   CREATININE 0.71 03/30/2023   BUN 13 03/30/2023   CO2 24 03/30/2023   TSH 5.78 (H) 03/30/2023   HGBA1C 5.7 11/16/2022   MICROALBUR 1.2 03/30/2023    No results found.     Assessment & Plan:  There are no diagnoses linked to this encounter.   Dale [place], MD

## 2023-04-27 NOTE — Progress Notes (Signed)
 Patient ID: Joy Houston, female   DOB: [birth year], 58 y.o.   MRN: [id number] Appt rescheduled.

## 2023-05-08 ENCOUNTER — Telehealth: Payer: Self-pay | Admitting: Internal Medicine

## 2023-05-08 DIAGNOSIS — Z1231 Encounter for screening mammogram for malignant neoplasm of breast: Secondary | ICD-10-CM

## 2023-05-08 NOTE — Telephone Encounter (Signed)
 Received notification she is overdue mammogram. Order has been in for almost one year. Need to schedule, if not done.   Thanks

## 2023-05-10 NOTE — Addendum Note (Signed)
 Addended by: Rita Ohara D on: 05/10/2023 10:13 AM   Modules accepted: Orders

## 2023-05-17 ENCOUNTER — Ambulatory Visit (HOSPITAL_BASED_OUTPATIENT_CLINIC_OR_DEPARTMENT_OTHER): Payer: BC Managed Care – PPO | Admitting: Cardiovascular Disease

## 2023-05-17 ENCOUNTER — Encounter (HOSPITAL_BASED_OUTPATIENT_CLINIC_OR_DEPARTMENT_OTHER): Payer: Self-pay | Admitting: Cardiovascular Disease

## 2023-05-17 VITALS — BP 150/102 | HR 71 | Ht 66.0 in | Wt 261.0 lb

## 2023-05-17 DIAGNOSIS — I10 Essential (primary) hypertension: Secondary | ICD-10-CM | POA: Diagnosis not present

## 2023-05-17 MED ORDER — HYDRALAZINE HCL 50 MG PO TABS: 50.0000 mg | ORAL_TABLET | Freq: Three times a day (TID) | ORAL | 3 refills | Status: DC

## 2023-05-17 NOTE — Progress Notes (Signed)
 [facility] Initial Assessment:    Date:  05/17/2023   ID:  Joy Houston, DOB 03/24/1965, MRN [id number]  PCP:  Dale Alamosa, MD  Cardiologist:  Parke Poisson, MD   Referring MD: Parke Poisson, MD   CC: Hypertension  History of Present Illness:    Joy Houston is a 58 y.o. female with a hx of hypertension, OSA, and PAF here to establish care in the [facility].  Joy Houston has been struggling with her blood pressure and working with Dr. Jacques Navy.  She has been on carvedilol and hydralazine and reported feeling orthostatic dizziness with positional changes.  This increased after increasing carvedilol.  She also noted having stress due to concern about her husband having an aortic aneurysm.  She reported issues with chronic diarrhea and concerns for malabsorption.  She reported that she felt her blood pressure issues were attributable to stress from her GI illness.  She was given hydralazine to take as needed.  Carvedilol has been helpful for both blood pressure and palpitations.  Dr. Jacques Navy previously recommended diuretics and the patient was reluctant due to her GI concerns.  She previously underwent atrial fibrillation/flutter ablation with Dr. Maisie Fus at [facility].   Discussed the use of AI scribe software for clinical note transcription with the patient, who gave verbal consent to proceed.  History of Present Illness Joy Houston has a long-standing history of hypertension, initially diagnosed in her thirties. Her blood pressure has been challenging to control due to medication sensitivities, including angioedema from lisinopril and intolerance to Benicar. She is currently on hydralazine three times a day and has recently increased her carvedilol. Previously, her blood pressure was well-controlled on medication but has recently been elevated, with readings sometimes reaching 170s/100s. She notes a recent improvement to 140s after dietary  changes. No chest pain, shortness of breath, or palpitations.  She has bile acid malabsorption following a cholecystectomy in 2020, leading to frequent defecation after meals. She suspects nutritional deficiencies due to this condition and has tested positive for parasites, which she is currently treating. She takes vitamin D and magnesium supplements and plans to start a multivitamin. She also takes Humira and a gut support supplement.  She is on the cusp of hyperthyroidism and struggles to lose weight. She experiences shortness of breath on inclines and has a history of sleep apnea for which she uses a device. She has noticed swelling in her ankles and fingers for several years.  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 She does not drink alcohol and consumes decaffeinated beverages. She has a history of gestational diabetes and manages her A1c through diet. She reports not getting as much exercise as she should, typically walking for 30 minutes in her neighborhood. She is sensitive to diet changes and is considering intermittent fasting or a structured diet plan she previously found successful.   Previous antihypertensives: ACE inhibitor-angioedema Amlodipine-itching Benicar  Past Medical History:  Diagnosis Date   Anemia    Angio-edema    Atrial fibrillation (HCC)    Complication of anesthesia    Diabetes mellitus without complication (HCC)    diet controlled   Dysplastic nevus 06/27/2018   Left upper back. Moderate atypia, limited margins free.    Eczema    Epstein Barr infection    Family history of anesthesia complication    Mother - confusion   Fatty liver    Heart murmur    Slight - "nothing to worry about"  Hemorrhoid    Hypertension    Hypothyroidism    PONV (postoperative nausea and vomiting)    "only with c-sections"   Sleep apnea    mild    Past Surgical History:  Procedure Laterality Date   CESAREAN SECTION     CHOLECYSTECTOMY N/A 09/07/2018   Procedure:  LAPAROSCOPIC CHOLECYSTECTOMY;  Surgeon: Carolan Shiver, MD;  Location: [facility] ORS;  Service: General;  Laterality: N/A;   FINGER SURGERY     pin to 3rd finger Right hand   LUMBAR LAMINECTOMY/DECOMPRESSION MICRODISCECTOMY  12/15/2011   Procedure: LUMBAR LAMINECTOMY/DECOMPRESSION MICRODISCECTOMY 1 LEVEL;  Surgeon: Cristi Loron, MD;  Location: [facility] OR[facility];  Service: Neurosurgery;  Laterality: Right;  RIGHT Lumbar four-Five diskectomy   LUMBAR LAMINECTOMY/DECOMPRESSION MICRODISCECTOMY N/A 11/04/2019   Procedure: MICRODISCECTOMY LEFT LUMBAR FIVE SACRAL ONE;  Surgeon: Tressie Stalker, MD;  Location: [facility] OR;  Service: Neurosurgery;  Laterality: N/A;  3C   MICRODISCECTOMY LUMBAR     L4-5   WISDOM TOOTH EXTRACTION      Current Medications: Current Meds  Medication Sig   carvedilol (COREG) 12.5 MG tablet Take 1 tablet (12.5 mg total) by mouth 2 (two) times daily with a meal.   EPINEPHrine (EPIPEN 2-PAK) 0.3 mg/0.3 mL IJ SOAJ injection Inject 0.3 mg into the muscle as needed for anaphylaxis.   hydrALAZINE (APRESOLINE) 25 MG tablet Take 1 tablet (25 mg total) by mouth 3 (three) times daily.   hydrocortisone (ANUSOL-HC) 25 MG suppository Place 1 suppository (25 mg total) rectally 2 (two) times daily as needed for hemorrhoids or anal itching.   hydrocortisone 2.5 % cream Apply twice a day for up to 2 weeks as needed for rash.   ketoconazole (NIZORAL) 2 % cream Apply twice a day as needed for rash.   Magnesium Bisglycinate Dihyd POWD Take 400 mg by mouth See admin instructions. Pt takes 2-4 times per week   VITAMIN D PO Take by mouth.     Allergies:   Angiotensin receptor blockers, Lisinopril, Amlodipine besylate, Gluten meal, and Morphine and codeine   Social History   Socioeconomic History   Marital status: Married    Spouse name: Not on file   Number of children: 2   Years of education: Not on file   Highest education level: Bachelor's degree (e.g., BA, AB, BS)  Occupational History     Employer: [employer]  Tobacco Use   Smoking status: Never   Smokeless tobacco: Never  Vaping Use   Vaping status: Never Used  Substance and Sexual Activity   Alcohol use: Not Currently    Alcohol/week: 0.0 standard drinks of alcohol   Drug use: No   Sexual activity: Yes    Partners: Male    Comment: married  Other Topics Concern   Not on file  Social History Narrative   Nurse    Married   Social Drivers of Health   Financial Resource Strain: Low Risk  (04/24/2023)   Overall Financial Resource Strain (CARDIA)    Difficulty of Paying Living Expenses: Not hard at all  Food Insecurity: No Food Insecurity (04/24/2023)   Hunger Vital Sign    Worried About Running Out of Food in the Last Year: Never true    Ran Out of Food in the Last Year: Never true  Transportation Needs: No Transportation Needs (04/24/2023)   PRAPARE - Administrator, Civil Service (Medical): No    Lack of Transportation (Non-Medical): No  Physical Activity: Unknown (04/24/2023)   Exercise Vital  Sign    Days of Exercise per Week: Patient declined    Minutes of Exercise per Session: Not on file  Stress: Patient Declined (04/24/2023)   [employer] of Occupational Health - Occupational Stress Questionnaire    Feeling of Stress : Patient declined  Social Connections: Socially Integrated (04/24/2023)   Social Connection and Isolation Panel [NHANES]    Frequency of Communication with Friends and Family: More than three times a week    Frequency of Social Gatherings with Friends and Family: More than three times a week    Attends Religious Services: More than 4 times per year    Active Member of [employer] or Organizations: Yes    Attends Engineer, structural: More than 4 times per year    Marital Status: Married    Family History: The patient's family history includes Breast cancer (age of onset: 56) in her sister; Cancer in her father; Diabetes in her father and paternal grandfather; GER  disease in her mother; Heart disease in her father and maternal grandfather.  ROS:   Please see the history of present illness.     All other systems reviewed and are negative.  EKGs/Labs/Other Studies Reviewed:    EKG:  EKG is not ordered today.    Recent Labs: 03/30/2023: ALT 45; BUN 13; Creatinine, Ser 0.71; Hemoglobin 14.5; Magnesium 1.8; Platelets 265.0; Potassium 4.1; Sodium 138; TSH 5.78   Recent Lipid Panel    Component Value Date/Time   CHOL 162 11/16/2022 0743   TRIG 69.0 11/16/2022 0743   HDL 62.40 11/16/2022 0743   CHOLHDL 3 11/16/2022 0743   VLDL 13.8 11/16/2022 0743   LDLCALC 86 11/16/2022 0743   LDLDIRECT 145.4 03/01/2013 0913    Physical Exam:   VS:  BP (!) 156/85 (BP Location: Right Arm)   Pulse 71   Ht 5\' 6"  (1.676 m)   Wt 261 lb (118.4 kg)   SpO2 98%   BMI 42.13 kg/m  , BMI Body mass index is 42.13 kg/m. GENERAL:  Well appearing HEENT: Pupils equal round and reactive, fundi not visualized, oral mucosa unremarkable NECK:  No jugular venous distention, waveform within normal limits, carotid upstroke brisk and symmetric, no bruits, no thyromegaly LUNGS:  Clear to auscultation bilaterally HEART:  RRR.  PMI not displaced or sustained,S1 and S2 within normal limits, no S3, no S4, no clicks, no rubs, no murmurs ABD:  Flat, positive bowel sounds normal in frequency in pitch, no bruits, no rebound, no guarding, no midline pulsatile mass, no hepatomegaly, no splenomegaly EXT:  2 plus pulses throughout, no edema, no cyanosis no clubbing SKIN:  No rashes no nodules NEURO:  Cranial nerves II through XII grossly intact, motor grossly intact throughout PSYCH:  Cognitively intact, oriented to person place and time   ASSESSMENT/PLAN:    Assessment & Plan # Hypertension Hypertension poorly controlled with current regimen. Intolerance to several antihypertensives limits options. Elevated cardiovascular risk due to family history and current blood pressure levels.  Discussed lifestyle modifications and potential side effects of increased hydralazine. - Increase hydralazine to 50 mg TID. - Monitor blood pressure at home, twice daily. - Order morning lab tests for cortisol, renin, and aldosterone. - Provide blood pressure tracking sheet. - Schedule follow-up in a couple of months.  # Sleep apnea Sleep apnea managed with an oral device. Discussed cardiovascular implications.  # Thyroid dysfunction Borderline hyperthyroidism affecting metabolic health and blood pressure.   # Bile acid malabsorption Post-cholecystectomy bile acid malabsorption causing diarrhea.  Discussed impact on electrolyte balance and blood pressure.  # Vitamin D deficiency Low vitamin D levels. Discussed its unlikely that this is playing a major role in hypertension but emphasized maintaining adequate levels.  # Family history of cardiovascular disease Family history of significant cardiovascular disease. Emphasized managing risk factors.   Screening for Secondary Hypertension:     Relevant Labs/Studies:    Latest Ref Rng & Units 03/30/2023    7:56 AM 11/16/2022    7:43 AM 02/22/2022    8:00 AM  Basic Labs  Sodium 135 - 145 mEq/L 138  139  140   Potassium 3.5 - 5.1 mEq/L 4.1  3.9  4.2   Creatinine 0.40 - 1.20 mg/dL 4.09  8.11  9.14        Latest Ref Rng & Units 03/30/2023    7:56 AM 11/16/2022    7:43 AM  Thyroid   TSH 0.35 - 5.50 uIU/mL 5.78  4.10     Disposition:    FU with MD/PharmD in 2 months    Medication Adjustments/Labs and Tests Ordered: Current medicines are reviewed at length with the patient today.  Concerns regarding medicines are outlined above.  No orders of the defined types were placed in this encounter.  No orders of the defined types were placed in this encounter.    Signed, Chilton Si, MD  05/17/2023 11:35 AM    [facility]

## 2023-05-17 NOTE — Patient Instructions (Addendum)
 Medication Instructions:  INCREASE HYDRALAZINE TO 50 MG THREE TIMES A DAY   Labwork: SOON FOR CORTISOL/RENINS/ALDOSTERONE LEVELS TO BE DONE FIRST THING IN THE MORNING   Testing/Procedures: NONE  Follow-Up: 2 MONTHS WITH CAITLIN W NP OR DR [place]   Any Other Special Instructions Will Be Listed Below (If Applicable). MONITOR YOUR BLOOD PRESSURE TWICE A DAY, LOG IN THE BOOK PROVIDED. BRING THE BOOK AND YOUR BLOOD PRESSURE MACHINE TO YOUR FOLLOW UP IN 1 MONTH     If you need a refill on your cardiac medications before your next appointment, please call your pharmacy.

## 2023-05-18 ENCOUNTER — Ambulatory Visit: Payer: Self-pay

## 2023-05-18 NOTE — Progress Notes (Signed)
 Nutrition: 05/18/2023  CC: "I'm looking for help with weight loss, a diet that will work for me.  I have lost weight in the past and have gained it back.  I have had my gall bladder removed and I am constantly dealing wight bile acids and the diarrhea that results.  I am unable to take all the bile acid sequestrants, thus I have to closely monitor and limit my fat intake."  Assessment: HT: 5'6'  WT: 261 lb  BMI: 42.15  Gives a history of weight gain and loss with the use of the Fast Metabolism Diet promoted by [employer]. At that time she lost to 166 lbs.  Has since had her gall bladder removed and is constantly bothered by diarrhea following fat intake.  She has suffered a herniated disc. Recently diagnosed with HTN, paroxysmal Atrial Fibrillation, and fatty liver disease.    Physical activity: Limited to walking.  Notes she could walk in her neighborhood.   Dietary:     Wanting very much to use the Fast Metabolism diet promoted by [employer].  On this diet she lost 20 lbs. In a month.  But the diarrhea from the high fat intake will most likely be a problem for her.  From a personal perspective, I need to examine the diet in detail. Joy Houston look to educate her regarding the use of the moderate/low carb, high protein, low fat diet used with bariatric surgery patients.  Recommendations: Walk 30 minutes per day.  Look to do 10 minutes after each meal or 30 minutes inside when it is too hot, cold or rainey. Increase your time or your speed as is possible and your walking stamina. Joy Houston try to use the ancient grains, lean protein, non-starchy vegetables and a "touch of fat."  Teaching handouts: Foods and Food Groups. Food and Food Groups for [employer]. Food Label Handout.  Follow-up: Please plan to follow up in 4-8 weeks.  Joy Shelia Kingsberry, RN, RD, LDn

## 2023-05-23 ENCOUNTER — Encounter (HOSPITAL_BASED_OUTPATIENT_CLINIC_OR_DEPARTMENT_OTHER): Payer: Self-pay | Admitting: Cardiovascular Disease

## 2023-05-23 ENCOUNTER — Ambulatory Visit: Admitting: Internal Medicine

## 2023-05-23 VITALS — BP 148/88 | HR 84 | Temp 97.9°F | Resp 16 | Ht 66.0 in | Wt 260.0 lb

## 2023-05-23 DIAGNOSIS — K760 Fatty (change of) liver, not elsewhere classified: Secondary | ICD-10-CM

## 2023-05-23 DIAGNOSIS — I10 Essential (primary) hypertension: Secondary | ICD-10-CM

## 2023-05-23 DIAGNOSIS — I480 Paroxysmal atrial fibrillation: Secondary | ICD-10-CM

## 2023-05-23 DIAGNOSIS — R197 Diarrhea, unspecified: Secondary | ICD-10-CM

## 2023-05-23 DIAGNOSIS — R945 Abnormal results of liver function studies: Secondary | ICD-10-CM

## 2023-05-23 DIAGNOSIS — E119 Type 2 diabetes mellitus without complications: Secondary | ICD-10-CM | POA: Diagnosis not present

## 2023-05-23 DIAGNOSIS — F439 Reaction to severe stress, unspecified: Secondary | ICD-10-CM

## 2023-05-23 DIAGNOSIS — E1165 Type 2 diabetes mellitus with hyperglycemia: Secondary | ICD-10-CM

## 2023-05-23 DIAGNOSIS — E782 Mixed hyperlipidemia: Secondary | ICD-10-CM

## 2023-05-23 MED ORDER — HYDRALAZINE HCL 25 MG PO TABS: 25.0000 mg | ORAL_TABLET | Freq: Three times a day (TID) | ORAL | 3 refills | Status: DC

## 2023-05-23 MED ORDER — HYDRALAZINE HCL 10 MG PO TABS: 10.0000 mg | ORAL_TABLET | Freq: Three times a day (TID) | ORAL | 3 refills | Status: DC

## 2023-05-23 NOTE — Progress Notes (Signed)
 Subjective:    Patient ID: Caren Hazy, female    DOB: September 14, 1965, 58 y.o.   MRN: [id number]  Patient here for  Chief Complaint  Patient presents with   Medical Management of Chronic Issues    HPI Here for a scheduled follow up - follow up regarding hypertension. Saw Dr Duke Salvia 05/17/23 - recommended increasing hydralazine to 50mg  tid. She felt this dose was too much. She had been taking 25mg  and 1/2 of 25 mg tablet bid. Asked today to have a rx for 25mg  and 10mg  to take together tid. Discussed - given prescribed by Dr Duke Salvia - to make her aware of change and request. She is tolerating this dose and blood pressure appears to be improving. Still elevated, but improved. Breathing stable. Discussed diet and exercise/weight loss. She is still having 3-4 bowel movements per day. She is still taking flagyl and biltricide - prescribed by Dr Mayford Knife - for persistent diarrhea and diagnosis of giardia and a tape worm. Taking intermittent doses. Feels joints are better. Bowels still an issue.    Past Medical History:  Diagnosis Date   Anemia    Angio-edema    Atrial fibrillation (HCC)    Complication of anesthesia    Diabetes mellitus without complication (HCC)    diet controlled   Dysplastic nevus 06/27/2018   Left upper back. Moderate atypia, limited margins free.    Eczema    Epstein Barr infection    Family history of anesthesia complication    Mother - confusion   Fatty liver    Heart murmur    Slight - "nothing to worry about"   Hemorrhoid    Hypertension    Hypothyroidism    PONV (postoperative nausea and vomiting)    "only with c-sections"   Sleep apnea    mild   Past Surgical History:  Procedure Laterality Date   CESAREAN SECTION     CHOLECYSTECTOMY N/A 09/07/2018   Procedure: LAPAROSCOPIC CHOLECYSTECTOMY;  Surgeon: Carolan Shiver, MD;  Location: [facility] ORS;  Service: General;  Laterality: N/A;   FINGER SURGERY     pin to 3rd finger Right hand   LUMBAR  LAMINECTOMY/DECOMPRESSION MICRODISCECTOMY  12/15/2011   Procedure: LUMBAR LAMINECTOMY/DECOMPRESSION MICRODISCECTOMY 1 LEVEL;  Surgeon: Cristi Loron, MD;  Location: [facility];  Service: Neurosurgery;  Laterality: Right;  RIGHT Lumbar four-Five diskectomy   LUMBAR LAMINECTOMY/DECOMPRESSION MICRODISCECTOMY N/A 11/04/2019   Procedure: MICRODISCECTOMY LEFT LUMBAR FIVE SACRAL ONE;  Surgeon: Tressie Stalker, MD;  Location: [facility] OR;  Service: Neurosurgery;  Laterality: N/A;  3C   MICRODISCECTOMY LUMBAR     L4-5   WISDOM TOOTH EXTRACTION     Family History  Problem Relation Age of Onset   Hypertension Mother    GER disease Mother    Heart failure Father    Hypertension Father    Diabetes Father    Cancer Father        Melanoma, Prostate   Heart disease Father    Breast cancer Sister 41   Stroke Maternal Grandmother    Heart attack Maternal Grandfather    Heart disease Maternal Grandfather    Diabetes Paternal Grandfather    Social History   Socioeconomic History   Marital status: Married    Spouse name: Not on file   Number of children: 2   Years of education: Not on file   Highest education level: Bachelor's degree (e.g., BA, AB, BS)  Occupational History    Employer: [employer]  Tobacco Use  Smoking status: Never   Smokeless tobacco: Never  Vaping Use   Vaping status: Never Used  Substance and Sexual Activity   Alcohol use: Not Currently    Alcohol/week: 0.0 standard drinks of alcohol   Drug use: No   Sexual activity: Yes    Partners: Male    Comment: married  Other Topics Concern   Not on file  Social History Narrative   Nurse    Married   Social Drivers of Health   Financial Resource Strain: Low Risk  (04/24/2023)   Overall Financial Resource Strain (CARDIA)    Difficulty of Paying Living Expenses: Not hard at all  Food Insecurity: No Food Insecurity (04/24/2023)   Hunger Vital Sign    Worried About Running Out of Food in the Last Year: Never true     Ran Out of Food in the Last Year: Never true  Transportation Needs: No Transportation Needs (04/24/2023)   PRAPARE - Administrator, Civil Service (Medical): No    Lack of Transportation (Non-Medical): No  Physical Activity: Unknown (04/24/2023)   Exercise Vital Sign    Days of Exercise per Week: Patient declined    Minutes of Exercise per Session: Not on file  Stress: Patient Declined (04/24/2023)   [employer] of Occupational Health - Occupational Stress Questionnaire    Feeling of Stress : Patient declined  Social Connections: Socially Integrated (04/24/2023)   Social Connection and Isolation Panel [NHANES]    Frequency of Communication with Friends and Family: More than three times a week    Frequency of Social Gatherings with Friends and Family: More than three times a week    Attends Religious Services: More than 4 times per year    Active Member of [employer] or Organizations: Yes    Attends Engineer, structural: More than 4 times per year    Marital Status: Married     Review of Systems  Constitutional:  Negative for appetite change and unexpected weight change.  HENT:  Negative for congestion and sinus pressure.   Respiratory:  Negative for cough, chest tightness and shortness of breath.   Cardiovascular:  Negative for chest pain and leg swelling.       No significant increased heart rate or palpations. Improved.   Gastrointestinal:  Positive for diarrhea. Negative for nausea and vomiting.       No increased abdominal pain.   Genitourinary:  Negative for difficulty urinating and dysuria.  Musculoskeletal:  Negative for myalgias.       Joints better.   Skin:  Negative for color change and rash.  Neurological:  Negative for dizziness and headaches.  Psychiatric/Behavioral:  Negative for agitation and dysphoric mood.        Objective:     BP (!) 148/88   Pulse 84   Temp 97.9 F (36.6 C)   Resp 16   Ht 5\' 6"  (1.676 m)   Wt 260 lb (117.9 kg)   SpO2  98%   BMI 41.97 kg/m  Wt Readings from Last 3 Encounters:  05/23/23 260 lb (117.9 kg)  05/17/23 261 lb (118.4 kg)  03/30/23 264 lb (119.7 kg)    Physical Exam Vitals reviewed.  Constitutional:      General: She is not in acute distress.    Appearance: Normal appearance.  HENT:     Head: Normocephalic and atraumatic.     Right Ear: External ear normal.     Left Ear: External ear normal.  Mouth/Throat:     Pharynx: No oropharyngeal exudate or posterior oropharyngeal erythema.  Eyes:     General: No scleral icterus.       Right eye: No discharge.        Left eye: No discharge.     Conjunctiva/sclera: Conjunctivae normal.  Neck:     Thyroid: No thyromegaly.  Cardiovascular:     Rate and Rhythm: Normal rate and regular rhythm.  Pulmonary:     Effort: No respiratory distress.     Breath sounds: Normal breath sounds. No wheezing.  Abdominal:     General: Bowel sounds are normal.     Palpations: Abdomen is soft.     Tenderness: There is no abdominal tenderness.  Musculoskeletal:        General: No swelling or tenderness.     Cervical back: Neck supple. No tenderness.  Lymphadenopathy:     Cervical: No cervical adenopathy.  Skin:    Findings: No erythema or rash.  Neurological:     Mental Status: She is alert.  Psychiatric:        Mood and Affect: Mood normal.        Behavior: Behavior normal.         Outpatient Encounter Medications as of 05/23/2023  Medication Sig   carvedilol (COREG) 12.5 MG tablet Take 1 tablet (12.5 mg total) by mouth 2 (two) times daily with a meal.   EPINEPHrine (EPIPEN 2-PAK) 0.3 mg/0.3 mL IJ SOAJ injection Inject 0.3 mg into the muscle as needed for anaphylaxis.   hydrocortisone (ANUSOL-HC) 25 MG suppository Place 1 suppository (25 mg total) rectally 2 (two) times daily as needed for hemorrhoids or anal itching.   hydrocortisone 2.5 % cream Apply twice a day for up to 2 weeks as needed for rash.   ketoconazole (NIZORAL) 2 % cream Apply  twice a day as needed for rash.   Magnesium Bisglycinate Dihyd POWD Take 400 mg by mouth See admin instructions. Pt takes 2-4 times per week   VITAMIN D PO Take by mouth.   [DISCONTINUED] hydrALAZINE (APRESOLINE) 50 MG tablet Take 1 tablet (50 mg total) by mouth 3 (three) times daily.   No facility-administered encounter medications on file as of 05/23/2023.     Lab Results  Component Value Date   WBC 7.5 03/30/2023   HGB 14.5 03/30/2023   HCT 42.8 03/30/2023   PLT 265.0 03/30/2023   GLUCOSE 120 (H) 03/30/2023   CHOL 162 11/16/2022   TRIG 69.0 11/16/2022   HDL 62.40 11/16/2022   LDLDIRECT 145.4 03/01/2013   LDLCALC 86 11/16/2022   ALT 45 (H) 03/30/2023   AST 31 03/30/2023   NA 138 03/30/2023   K 4.1 03/30/2023   CL 105 03/30/2023   CREATININE 0.71 03/30/2023   BUN 13 03/30/2023   CO2 24 03/30/2023   TSH 5.78 (H) 03/30/2023   HGBA1C 5.7 11/16/2022   MICROALBUR 1.2 03/30/2023    No results found.     Assessment & Plan:  Type 2 diabetes mellitus with hyperglycemia, without long-term current use of insulin (HCC) Assessment & Plan: Continue low carb diet and exercise.  Discussed diet and exercise today. Follow met b and A1c.  Lab Results  Component Value Date   HGBA1C 5.7 11/16/2022     Stress Assessment & Plan:   Increased stress with husband's health issues.  Appears to be handling things ok. Follow.    Paroxysmal A-fib [facility]) Assessment & Plan: S/p ablation.  Off xarelto.  Afib/symptoms improved  overall.  Since being on carvedilol, it appears that palpitations symptoms have improved. Follow pressures. Followed by cardiology. Stable.    Primary hypertension Assessment & Plan: Blood pressure remains elevated. Saw Dr Duke Salvia recently. Recommended to increase hydralazine to 50mg  tid. She did not tolerate this dose. Has been taking 25mg  plus 1/2 (25mg ) tablet tid. Tolerating. Request rx for 25mg  tablets and 10mg  tablets to take one of each tid. Follow pressures. Plans  to increase to 50mg  tid as tolerated. Follow metabolic panel.    Hyperlipidemia, mixed Assessment & Plan: Low cholesterol diet and exercise.  Discussed diet and exercise. Follow lipid panel.    Fatty liver Assessment & Plan: Follow liver panel. Discussed diet, exercise. Follow liver panel.    Diarrhea, unspecified type Assessment & Plan: Previous stool studies negative.  She has had persistent problems with diarrhea. (Ongoing for years). Symptoms seem to have been more noticeable after cholecystectomy 08/2018.  Work up to date has included - TTgIGA negative.  Lipase wnl.  CT abd/pelvis - diverticulosis, otherwise negative. Colonoscopy negative for microscopic colitis.  Previous TSH/T4 negative.  No weight loss.  Eating.  No nausea or vomiting.   Have discussed history of loose stools.  Declines cholestyramine. Recent increase in stools. Discussed again - colestid. Notify me if changes her mind about starting coloestid. Has seen Dr Mayford Knife recently. Diagnosed with giardia and a tape worm. Currently on flagyl and biltricide. Continues on intermittent doses of these two medications.    Abnormal liver function Assessment & Plan: Diet and exercise and weight loss. Follow liver panel.       Dale Eddystone, MD

## 2023-05-23 NOTE — Telephone Encounter (Signed)
 Discussed with Dr Duke Salvia and ok to make change as requested, Hydralazine 10 mg and 25 mg together three times a day

## 2023-05-24 LAB — ALDOSTERONE + RENIN ACTIVITY W/ RATIO
Aldos/Renin Ratio: 12 (ref 0.0–30.0)
Aldosterone: 8.3 ng/dL (ref 0.0–30.0)
Renin Activity, Plasma: 0.69 ng/mL/h (ref 0.167–5.380)

## 2023-05-24 LAB — CORTISOL: Cortisol: 9.4 ug/dL (ref 6.2–19.4)

## 2023-05-28 ENCOUNTER — Encounter: Payer: Self-pay | Admitting: Internal Medicine

## 2023-05-28 NOTE — Assessment & Plan Note (Signed)
Low cholesterol diet and exercise.  Discussed diet and exercise.  Follow lipid panel.   

## 2023-05-28 NOTE — Assessment & Plan Note (Signed)
Diet and exercise and weight loss.  Follow liver panel.

## 2023-05-28 NOTE — Assessment & Plan Note (Signed)
 Increased stress with husband's health issues.  Appears to be handling things ok. Follow.

## 2023-05-28 NOTE — Assessment & Plan Note (Signed)
 Follow liver panel. Discussed diet, exercise. Follow liver panel.

## 2023-05-28 NOTE — Assessment & Plan Note (Signed)
 Blood pressure remains elevated. Saw Dr Duke Salvia recently. Recommended to increase hydralazine to 50mg  tid. She did not tolerate this dose. Has been taking 25mg  plus 1/2 (25mg ) tablet tid. Tolerating. Request rx for 25mg  tablets and 10mg  tablets to take one of each tid. Follow pressures. Plans to increase to 50mg  tid as tolerated. Follow metabolic panel.

## 2023-05-28 NOTE — Assessment & Plan Note (Signed)
 S/p ablation.  Off xarelto.  Afib/symptoms improved overall.  Since being on carvedilol, it appears that palpitations symptoms have improved. Follow pressures. Followed by cardiology. Stable.

## 2023-05-28 NOTE — Assessment & Plan Note (Signed)
 Continue low carb diet and exercise.  Discussed diet and exercise today. Follow met b and A1c.  Lab Results  Component Value Date   HGBA1C 5.7 11/16/2022

## 2023-05-28 NOTE — Assessment & Plan Note (Signed)
 Previous stool studies negative.  She has had persistent problems with diarrhea. (Ongoing for years). Symptoms seem to have been more noticeable after cholecystectomy 08/2018.  Work up to date has included - TTgIGA negative.  Lipase wnl.  CT abd/pelvis - diverticulosis, otherwise negative. Colonoscopy negative for microscopic colitis.  Previous TSH/T4 negative.  No weight loss.  Eating.  No nausea or vomiting.   Have discussed history of loose stools.  Declines cholestyramine. Recent increase in stools. Discussed again - colestid. Notify me if changes her mind about starting coloestid. Has seen Dr Mayford Knife recently. Diagnosed with giardia and a tape worm. Currently on flagyl and biltricide. Continues on intermittent doses of these two medications.

## 2023-06-06 ENCOUNTER — Ambulatory Visit
Admission: RE | Admit: 2023-06-06 | Discharge: 2023-06-06 | Disposition: A | Discharge status: Home / Self Care | Source: Ambulatory Visit | Attending: Internal Medicine | Admitting: Internal Medicine

## 2023-06-06 DIAGNOSIS — Z1231 Encounter for screening mammogram for malignant neoplasm of breast: Secondary | ICD-10-CM | POA: Diagnosis not present

## 2023-06-07 ENCOUNTER — Other Ambulatory Visit (HOSPITAL_BASED_OUTPATIENT_CLINIC_OR_DEPARTMENT_OTHER): Payer: Self-pay | Admitting: *Deleted

## 2023-06-07 ENCOUNTER — Encounter (HOSPITAL_BASED_OUTPATIENT_CLINIC_OR_DEPARTMENT_OTHER): Payer: Self-pay | Admitting: Cardiovascular Disease

## 2023-06-07 ENCOUNTER — Encounter (HOSPITAL_BASED_OUTPATIENT_CLINIC_OR_DEPARTMENT_OTHER): Payer: Self-pay

## 2023-06-08 MED ORDER — HYDRALAZINE HCL 10 MG PO TABS: 10.0000 mg | ORAL_TABLET | Freq: Three times a day (TID) | ORAL | 3 refills | Status: DC

## 2023-06-22 DIAGNOSIS — E119 Type 2 diabetes mellitus without complications: Secondary | ICD-10-CM | POA: Diagnosis not present

## 2023-06-29 ENCOUNTER — Encounter (HOSPITAL_COMMUNITY): Payer: Self-pay

## 2023-07-06 ENCOUNTER — Encounter: Payer: Self-pay | Admitting: Internal Medicine

## 2023-07-06 ENCOUNTER — Ambulatory Visit (INDEPENDENT_AMBULATORY_CARE_PROVIDER_SITE_OTHER): Admitting: Internal Medicine

## 2023-07-06 DIAGNOSIS — I10 Essential (primary) hypertension: Secondary | ICD-10-CM

## 2023-07-06 NOTE — Progress Notes (Deleted)
 Subjective:    Patient ID: Joy Houston, female    DOB: 1966-01-29, 58 y.o.   MRN: [id number]  Patient here for No chief complaint on file.   HPI Here for a scheduled follow up - follow up regarding hypercholesterolemia, hypertension and diabetes. Seeing Dr Theodis Fiscal for her blood pressure.  Labs - revealed she does not have hyperaldosteronism or elevated cortisol. Taking a total of 35mg  tid hydralazine . 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    Past Medical History:  Diagnosis Date   Anemia    Angio-edema    Atrial fibrillation (HCC)    Complication of anesthesia    Diabetes mellitus without complication (HCC)    diet controlled   Dysplastic nevus 06/27/2018   Left upper back. Moderate atypia, limited margins free.    Eczema    Epstein Barr infection    Family history of anesthesia complication    Mother - confusion   Fatty liver    Heart murmur    Slight - "nothing to worry about"   Hemorrhoid    Hypertension    Hypothyroidism    PONV (postoperative nausea and vomiting)    "only with c-sections"   Sleep apnea    mild   Past Surgical History:  Procedure Laterality Date   CESAREAN SECTION     CHOLECYSTECTOMY N/A 09/07/2018   Procedure: LAPAROSCOPIC CHOLECYSTECTOMY;  Surgeon: Eldred Grego, MD;  Location: [facility] ORS;  Service: General;  Laterality: N/A;   FINGER SURGERY     pin to 3rd finger Right hand   LUMBAR LAMINECTOMY/DECOMPRESSION MICRODISCECTOMY  12/15/2011   Procedure: LUMBAR LAMINECTOMY/DECOMPRESSION MICRODISCECTOMY 1 LEVEL;  Surgeon: Elder Greening, MD;  Location: [facility];  Service: Neurosurgery;  Laterality: Right;  RIGHT Lumbar four-Five diskectomy   LUMBAR LAMINECTOMY/DECOMPRESSION MICRODISCECTOMY N/A 11/04/2019   Procedure: MICRODISCECTOMY LEFT LUMBAR FIVE SACRAL ONE;  Surgeon: Garry Kansas, MD;  Location: [facility] OR;  Service:  Neurosurgery;  Laterality: N/A;  3C   MICRODISCECTOMY LUMBAR     L4-5   WISDOM TOOTH EXTRACTION     Family History  Problem Relation Age of Onset   Hypertension Mother    GER disease Mother    Heart failure Father    Hypertension Father    Diabetes Father    Cancer Father        Melanoma, Prostate   Heart disease Father    Breast cancer Sister 60   Stroke Maternal Grandmother    Heart attack Maternal Grandfather    Heart disease Maternal Grandfather    Diabetes Paternal Grandfather    Social History   Socioeconomic History   Marital status: Married    Spouse name: Not on file   Number of children: 2   Years of education: Not on file   Highest education level: Bachelor's degree (e.g., BA, AB, BS)  Occupational History    Employer: [employer]  Tobacco Use   Smoking status: Never   Smokeless tobacco: Never  Vaping Use   Vaping status: Never Used  Substance and Sexual Activity   Alcohol use: Not Currently    Alcohol/week: 0.0 standard drinks of alcohol   Drug use: No   Sexual activity: Yes    Partners: Male    Comment: married  Other Topics Concern   Not on file  Social History Narrative   Nurse    Married   Social  Drivers of Health   Financial Resource Strain: Low Risk  (04/24/2023)   Overall Financial Resource Strain (CARDIA)    Difficulty of Paying Living Expenses: Not hard at all  Food Insecurity: No Food Insecurity (04/24/2023)   Hunger Vital Sign    Worried About Running Out of Food in the Last Year: Never true    Ran Out of Food in the Last Year: Never true  Transportation Needs: No Transportation Needs (04/24/2023)   PRAPARE - Administrator, Civil Service (Medical): No    Lack of Transportation (Non-Medical): No  Physical Activity: Unknown (04/24/2023)   Exercise Vital Sign    Days of Exercise per Week: Patient declined    Minutes of Exercise per Session: Not on file  Stress: Patient Declined (04/24/2023)   [employer] of  Occupational Health - Occupational Stress Questionnaire    Feeling of Stress : Patient declined  Social Connections: Socially Integrated (04/24/2023)   Social Connection and Isolation Panel [NHANES]    Frequency of Communication with Friends and Family: More than three times a week    Frequency of Social Gatherings with Friends and Family: More than three times a week    Attends Religious Services: More than 4 times per year    Active Member of [employer] or Organizations: Yes    Attends Engineer, structural: More than 4 times per year    Marital Status: Married     Review of Systems     Objective:     There were no vitals taken for this visit. Wt Readings from Last 3 Encounters:  05/23/23 260 lb (117.9 kg)  05/17/23 261 lb (118.4 kg)  03/30/23 264 lb (119.7 kg)    Physical Exam  {Perform Simple Foot Exam  Perform Detailed exam:1} {Insert foot Exam (Optional):30965}   Outpatient Encounter Medications as of 07/06/2023  Medication Sig   carvedilol  (COREG ) 12.5 MG tablet Take 1 tablet (12.5 mg total) by mouth 2 (two) times daily with a meal.   EPINEPHrine  (EPIPEN  2-PAK) 0.3 mg/0.3 mL IJ SOAJ injection Inject 0.3 mg into the muscle as needed for anaphylaxis.   hydrALAZINE  (APRESOLINE ) 10 MG tablet Take 1 tablet (10 mg total) by mouth 3 (three) times daily. To take with the 25 mg tablets   hydrALAZINE  (APRESOLINE ) 25 MG tablet Take 1 tablet (25 mg total) by mouth 3 (three) times daily. To take with the 10 mg tablets   hydrocortisone  (ANUSOL -HC) 25 MG suppository Place 1 suppository (25 mg total) rectally 2 (two) times daily as needed for hemorrhoids or anal itching.   hydrocortisone  2.5 % cream Apply twice a day for up to 2 weeks as needed for rash.   ketoconazole  (NIZORAL ) 2 % cream Apply twice a day as needed for rash.   Magnesium Bisglycinate Dihyd POWD Take 400 mg by mouth See admin instructions. Pt takes 2-4 times per week   VITAMIN D  PO Take by mouth.   No  facility-administered encounter medications on file as of 07/06/2023.     Lab Results  Component Value Date   WBC 7.5 03/30/2023   HGB 14.5 03/30/2023   HCT 42.8 03/30/2023   PLT 265.0 03/30/2023   GLUCOSE 120 (H) 03/30/2023   CHOL 162 11/16/2022   TRIG 69.0 11/16/2022   HDL 62.40 11/16/2022   LDLDIRECT 145.4 03/01/2013   LDLCALC 86 11/16/2022   ALT 45 (H) 03/30/2023   AST 31 03/30/2023   NA 138 03/30/2023   K 4.1 03/30/2023  CL 105 03/30/2023   CREATININE 0.71 03/30/2023   BUN 13 03/30/2023   CO2 24 03/30/2023   TSH 5.78 (H) 03/30/2023   HGBA1C 5.7 11/16/2022   MICROALBUR 1.2 03/30/2023    MM 3D SCREENING MAMMOGRAM BILATERAL BREAST Result Date: 06/08/2023 CLINICAL DATA:  Screening. EXAM: DIGITAL SCREENING BILATERAL MAMMOGRAM WITH TOMOSYNTHESIS AND CAD TECHNIQUE: Bilateral screening digital craniocaudal and mediolateral oblique mammograms were obtained. Bilateral screening digital breast tomosynthesis was performed. The images were evaluated with computer-aided detection. COMPARISON:  Previous exam(s). ACR Breast Density Category b: There are scattered areas of fibroglandular density. FINDINGS: There are no findings suspicious for malignancy. IMPRESSION: No mammographic evidence of malignancy. A result letter of this screening mammogram will be mailed directly to the patient. RECOMMENDATION: Screening mammogram in one year. (Code:SM-B-01Y) BI-RADS CATEGORY  1: Negative. Electronically Signed   By: Amanda Jungling M.D.   On: 06/08/2023 15:09       Assessment & Plan:  There are no diagnoses linked to this encounter.   [name], MD

## 2023-07-06 NOTE — Progress Notes (Signed)
Patient ID: Joy Houston, female   DOB: 02/19/1966, 58 y.o.   MRN: [id number] Did not show for appt.

## 2023-07-12 DIAGNOSIS — G43109 Migraine with aura, not intractable, without status migrainosus: Secondary | ICD-10-CM | POA: Diagnosis not present

## 2023-07-12 DIAGNOSIS — E119 Type 2 diabetes mellitus without complications: Secondary | ICD-10-CM | POA: Diagnosis not present

## 2023-07-12 LAB — HM DIABETES EYE EXAM

## 2023-07-14 ENCOUNTER — Ambulatory Visit: Admitting: Internal Medicine

## 2023-07-14 ENCOUNTER — Encounter: Payer: Self-pay | Admitting: Internal Medicine

## 2023-07-14 VITALS — BP 146/84 | HR 88 | Ht 66.0 in | Wt 265.8 lb

## 2023-07-14 DIAGNOSIS — E782 Mixed hyperlipidemia: Secondary | ICD-10-CM | POA: Diagnosis not present

## 2023-07-14 DIAGNOSIS — E1165 Type 2 diabetes mellitus with hyperglycemia: Secondary | ICD-10-CM | POA: Diagnosis not present

## 2023-07-14 DIAGNOSIS — G4733 Obstructive sleep apnea (adult) (pediatric): Secondary | ICD-10-CM

## 2023-07-14 DIAGNOSIS — M5442 Lumbago with sciatica, left side: Secondary | ICD-10-CM

## 2023-07-14 DIAGNOSIS — E559 Vitamin D deficiency, unspecified: Secondary | ICD-10-CM

## 2023-07-14 DIAGNOSIS — E538 Deficiency of other specified B group vitamins: Secondary | ICD-10-CM

## 2023-07-14 DIAGNOSIS — R945 Abnormal results of liver function studies: Secondary | ICD-10-CM | POA: Diagnosis not present

## 2023-07-14 DIAGNOSIS — I10 Essential (primary) hypertension: Secondary | ICD-10-CM | POA: Diagnosis not present

## 2023-07-14 DIAGNOSIS — I480 Paroxysmal atrial fibrillation: Secondary | ICD-10-CM

## 2023-07-14 DIAGNOSIS — K760 Fatty (change of) liver, not elsewhere classified: Secondary | ICD-10-CM

## 2023-07-14 DIAGNOSIS — R197 Diarrhea, unspecified: Secondary | ICD-10-CM

## 2023-07-14 LAB — LIPID PANEL
Cholesterol: 205 mg/dL — ABNORMAL HIGH (ref 0–200)
HDL: 61.6 mg/dL (ref >39.00)
LDL Cholesterol: 121 mg/dL — ABNORMAL HIGH (ref 0–99)
NonHDL: 143.72
Total CHOL/HDL Ratio: 3
Triglycerides: 112 mg/dL (ref 0.0–149.0)
VLDL: 22.4 mg/dL (ref 0.0–40.0)

## 2023-07-14 LAB — HEPATIC FUNCTION PANEL
ALT: 40 U/L — ABNORMAL HIGH (ref 0–35)
AST: 26 U/L (ref 0–37)
Albumin: 4.3 g/dL (ref 3.5–5.2)
Alkaline Phosphatase: 67 U/L (ref 39–117)
Bilirubin, Direct: 0.1 mg/dL (ref 0.0–0.3)
Total Bilirubin: 0.5 mg/dL (ref 0.2–1.2)
Total Protein: 7.4 g/dL (ref 6.0–8.3)

## 2023-07-14 LAB — BASIC METABOLIC PANEL WITH GFR
BUN: 12 mg/dL (ref 6–23)
CO2: 26 meq/L (ref 19–32)
Calcium: 9.4 mg/dL (ref 8.4–10.5)
Chloride: 102 meq/L (ref 96–112)
Creatinine, Ser: 0.63 mg/dL (ref 0.40–1.20)
GFR: 99.51 mL/min (ref >60.00)
Glucose, Bld: 133 mg/dL — ABNORMAL HIGH (ref 70–99)
Potassium: 4.2 meq/L (ref 3.5–5.1)
Sodium: 138 meq/L (ref 135–145)

## 2023-07-14 LAB — HEMOGLOBIN A1C: Hgb A1c MFr Bld: 6.3 % (ref 4.6–6.5)

## 2023-07-14 LAB — VITAMIN B12: Vitamin B-12: 284 pg/mL (ref 211–911)

## 2023-07-14 LAB — T4, FREE: Free T4: 0.8 ng/dL (ref 0.60–1.60)

## 2023-07-14 LAB — TSH: TSH: 5.7 u[IU]/mL — ABNORMAL HIGH (ref 0.35–5.50)

## 2023-07-14 LAB — VITAMIN D 25 HYDROXY (VIT D DEFICIENCY, FRACTURES): VITD: 26.57 ng/mL — ABNORMAL LOW (ref 30.00–100.00)

## 2023-07-14 MED ORDER — CARVEDILOL 12.5 MG PO TABS: 12.5000 mg | ORAL_TABLET | Freq: Two times a day (BID) | ORAL | 1 refills | Status: DC

## 2023-07-14 NOTE — Assessment & Plan Note (Signed)
 Previous stool studies negative.  She has had persistent problems with diarrhea. (Ongoing for years). Symptoms seem to have been more noticeable after cholecystectomy 08/2018.  Work up to date has included - TTgIGA negative.  Lipase wnl.  CT abd/pelvis - diverticulosis, otherwise negative. Colonoscopy negative for microscopic colitis.  Previous TSH/T4 negative.  No weight loss.  Eating.  No nausea or vomiting.   Have discussed history of loose stools.  Declines cholestyramine. Recent increase in stools. Discussed again - colestid. Notify me if changes her mind about starting coloestid. Has seen Dr Broadus Canes recently. Diagnosed with giardia and a tape worm. Currently on flagyl and biltricide. Continues on intermittent doses of these two medications. Discussed again today. Wants to continue with above regimen.

## 2023-07-14 NOTE — Assessment & Plan Note (Signed)
 Check vitamin D level today

## 2023-07-14 NOTE — Assessment & Plan Note (Signed)
 Low cholesterol diet and exercise.  Discussed diet and exercise. Check lipid panel.

## 2023-07-14 NOTE — Assessment & Plan Note (Signed)
 Blood pressure remains elevated. Seeing Dr Theodis Fiscal. Recommended to increase hydralazine  to 50mg  tid. She did not tolerate this dose. Has been taking 35mg  tid. Tolerating.  Follow pressures. Plans to increase to 50mg  tid as tolerated. Follow metabolic panel. Discussed changing medication today. She wants to hold on changing medication today. Plans to f/u soon with Dr Theodis Fiscal soon. Follow pressures. Check metabolic panel.

## 2023-07-14 NOTE — Assessment & Plan Note (Signed)
 S/p ablation.  Off xarelto.  Afib/symptoms improved overall.  Follow pressures. Followed by cardiology. Some increase recently. Wants to hold on any further evaluation or intervention. Wants to discuss with Dr Theodis Fiscal.

## 2023-07-14 NOTE — Assessment & Plan Note (Signed)
 Continue low carb diet and exercise.  Discussed diet and exercise today.  Follow met b and A1c. Check labs today.  Lab Results  Component Value Date   HGBA1C 5.7 11/16/2022

## 2023-07-14 NOTE — Assessment & Plan Note (Signed)
 History of back surgery. Recent flare. Discussed exercises/stretches. Avoind increased antiinflammatory medication given her blood pressure elevation.  Tylenol . Follow.  Discussed PT. Will notify me if desires further intervention. No radicular symptoms.

## 2023-07-14 NOTE — Assessment & Plan Note (Signed)
Diet and exercise.  Check liver panel today.

## 2023-07-14 NOTE — Assessment & Plan Note (Signed)
 Check vitamin B12 level today.

## 2023-07-14 NOTE — Progress Notes (Signed)
 Subjective:    Patient ID: Joy Houston, female    DOB: [birth year], 58 y.o.   MRN: [id number]  Patient here for  Chief Complaint  Patient presents with   Medical Management of Chronic Issues    Follow up on blood pressure    HPI Here for a scheduled follow up - follow up regarding hypercholesterolemia, hypertension and diabetes. Seeing Dr Theodis Fiscal for her blood pressure. Labs - revealed she does not have hyperaldosteronism or elevated cortisol. Taking a total of 35mg  tid hydralazine . 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  Continues f/u with Dr Broadus Canes for this. She has decreased her po intake to try and help with her bowel issues. She has noticed some increased low back pain - concerned regarding SI pain. Notices when turning in bed. Does not bother her when up and moving around. Aggravated after recent bus trip. Discussed antiinflammatory medication.  Would like to avoid increased antiinflammatory medication given h/o elevated blood pressure with persistent elevation. No radicular symptoms. Some occasional flares with her heart. Overall improved from her previous ablation. Discussed further w/up. She wants to hold at this time.    Past Medical History:  Diagnosis Date   Anemia    Angio-edema    Atrial fibrillation (HCC)    Complication of anesthesia    Diabetes mellitus without complication (HCC)    diet controlled   Dysplastic nevus 06/27/2018   Left upper back. Moderate atypia, limited margins free.    Eczema    Epstein Barr infection    Family history of anesthesia complication    Mother - confusion   Fatty liver    Heart murmur    Slight - "nothing to worry about"   Hemorrhoid    Hypertension    Hypothyroidism    PONV (postoperative nausea and vomiting)    "only with c-sections"   Sleep apnea    mild   Past Surgical History:  Procedure Laterality Date    CESAREAN SECTION     CHOLECYSTECTOMY N/A 09/07/2018   Procedure: LAPAROSCOPIC CHOLECYSTECTOMY;  Surgeon: Eldred Grego, MD;  Location: [facility] ORS;  Service: General;  Laterality: N/A;   FINGER SURGERY     pin to 3rd finger Right hand   LUMBAR LAMINECTOMY/DECOMPRESSION MICRODISCECTOMY  12/15/2011   Procedure: LUMBAR LAMINECTOMY/DECOMPRESSION MICRODISCECTOMY 1 LEVEL;  Surgeon: Elder Greening, MD;  Location: [facility];  Service: Neurosurgery;  Laterality: Right;  RIGHT Lumbar four-Five diskectomy   LUMBAR LAMINECTOMY/DECOMPRESSION MICRODISCECTOMY N/A 11/04/2019   Procedure: MICRODISCECTOMY LEFT LUMBAR FIVE SACRAL ONE;  Surgeon: Garry Kansas, MD;  Location: [facility];  Service: Neurosurgery;  Laterality: N/A;  3C   MICRODISCECTOMY LUMBAR     L4-5   WISDOM TOOTH EXTRACTION     Family History  Problem Relation Age of Onset   Hypertension Mother    GER disease Mother    Heart failure Father    Hypertension Father    Diabetes Father    Cancer Father        Melanoma, Prostate   Heart disease Father    Breast cancer Sister 54   Stroke Maternal Grandmother    Heart attack Maternal Grandfather    Heart disease Maternal Grandfather    Diabetes Paternal Grandfather    Social History   Socioeconomic History   Marital status: Married    Spouse name: Not on file   Number of  children: 2   Years of education: Not on file   Highest education level: Bachelor's degree (e.g., BA, AB, BS)  Occupational History    Employer: [employer]  Tobacco Use   Smoking status: Never   Smokeless tobacco: Never  Vaping Use   Vaping status: Never Used  Substance and Sexual Activity   Alcohol use: Not Currently    Alcohol/week: 0.0 standard drinks of alcohol   Drug use: No   Sexual activity: Yes    Partners: Male    Comment: married  Other Topics Concern   Not on file  Social History Narrative   Nurse    Married   Social Drivers of Health   Financial Resource Strain: Low Risk   (04/24/2023)   Overall Financial Resource Strain (CARDIA)    Difficulty of Paying Living Expenses: Not hard at all  Food Insecurity: No Food Insecurity (04/24/2023)   Hunger Vital Sign    Worried About Running Out of Food in the Last Year: Never true    Ran Out of Food in the Last Year: Never true  Transportation Needs: No Transportation Needs (04/24/2023)   PRAPARE - Administrator, Civil Service (Medical): No    Lack of Transportation (Non-Medical): No  Physical Activity: Unknown (04/24/2023)   Exercise Vital Sign    Days of Exercise per Week: Patient declined    Minutes of Exercise per Session: Not on file  Stress: Patient Declined (04/24/2023)   [employer] of Occupational Health - Occupational Stress Questionnaire    Feeling of Stress : Patient declined  Social Connections: Socially Integrated (04/24/2023)   Social Connection and Isolation Panel [NHANES]    Frequency of Communication with Friends and Family: More than three times a week    Frequency of Social Gatherings with Friends and Family: More than three times a week    Attends Religious Services: More than 4 times per year    Active Member of [employer] or Organizations: Yes    Attends Engineer, structural: More than 4 times per year    Marital Status: Married     Review of Systems  Constitutional:  Negative for appetite change and unexpected weight change.  HENT:  Negative for congestion and sinus pressure.   Respiratory:  Negative for cough and chest tightness.        Some occasional sob with "flares".   Cardiovascular:  Negative for chest pain and leg swelling.       Intermittent flares as outlined.   Gastrointestinal:  Positive for diarrhea. Negative for nausea and vomiting.       No significant abdominal pain.   Genitourinary:  Positive for frequency. Negative for difficulty urinating and dysuria.  Skin:  Negative for color change and rash.  Neurological:  Negative for dizziness and headaches.   Psychiatric/Behavioral:  Negative for agitation and dysphoric mood.        Objective:     BP (!) 146/84   Pulse 88   Ht 5\' 6"  (1.676 m)   Wt 265 lb 12.8 oz (120.6 kg)   SpO2 95%   BMI 42.90 kg/m  Wt Readings from Last 3 Encounters:  07/14/23 265 lb 12.8 oz (120.6 kg)  05/23/23 260 lb (117.9 kg)  05/17/23 261 lb (118.4 kg)    Physical Exam Vitals reviewed.  Constitutional:      General: She is not in acute distress.    Appearance: Normal appearance.  HENT:     Head: Normocephalic and atraumatic.  Right Ear: External ear normal.     Left Ear: External ear normal.     Mouth/Throat:     Pharynx: No oropharyngeal exudate or posterior oropharyngeal erythema.  Eyes:     General: No scleral icterus.       Right eye: No discharge.        Left eye: No discharge.     Conjunctiva/sclera: Conjunctivae normal.  Neck:     Thyroid : No thyromegaly.  Cardiovascular:     Rate and Rhythm: Normal rate and regular rhythm.  Pulmonary:     Effort: No respiratory distress.     Breath sounds: Normal breath sounds. No wheezing.  Abdominal:     General: Bowel sounds are normal.     Palpations: Abdomen is soft.     Tenderness: There is no abdominal tenderness.  Musculoskeletal:        General: No swelling or tenderness.     Cervical back: Neck supple. No tenderness.  Lymphadenopathy:     Cervical: No cervical adenopathy.  Skin:    Findings: No erythema or rash.  Neurological:     Mental Status: She is alert.  Psychiatric:        Mood and Affect: Mood normal.        Behavior: Behavior normal.         Outpatient Encounter Medications as of 07/14/2023  Medication Sig   EPINEPHrine  (EPIPEN  2-PAK) 0.3 mg/0.3 mL IJ SOAJ injection Inject 0.3 mg into the muscle as needed for anaphylaxis.   hydrALAZINE  (APRESOLINE ) 10 MG tablet Take 1 tablet (10 mg total) by mouth 3 (three) times daily. To take with the 25 mg tablets   hydrALAZINE  (APRESOLINE ) 25 MG tablet Take 1 tablet (25 mg  total) by mouth 3 (three) times daily. To take with the 10 mg tablets   hydrocortisone  (ANUSOL -HC) 25 MG suppository Place 1 suppository (25 mg total) rectally 2 (two) times daily as needed for hemorrhoids or anal itching.   hydrocortisone  2.5 % cream Apply twice a day for up to 2 weeks as needed for rash.   ketoconazole  (NIZORAL ) 2 % cream Apply twice a day as needed for rash.   Magnesium Bisglycinate Dihyd POWD Take 400 mg by mouth See admin instructions. Pt takes 2-4 times per week   VITAMIN D  PO Take by mouth.   [DISCONTINUED] carvedilol  (COREG ) 12.5 MG tablet Take 1 tablet (12.5 mg total) by mouth 2 (two) times daily with a meal.   carvedilol  (COREG ) 12.5 MG tablet Take 1 tablet (12.5 mg total) by mouth 2 (two) times daily with a meal.   No facility-administered encounter medications on file as of 07/14/2023.     Lab Results  Component Value Date   WBC 7.5 03/30/2023   HGB 14.5 03/30/2023   HCT 42.8 03/30/2023   PLT 265.0 03/30/2023   GLUCOSE 120 (H) 03/30/2023   CHOL 162 11/16/2022   TRIG 69.0 11/16/2022   HDL 62.40 11/16/2022   LDLDIRECT 145.4 03/01/2013   LDLCALC 86 11/16/2022   ALT 45 (H) 03/30/2023   AST 31 03/30/2023   NA 138 03/30/2023   K 4.1 03/30/2023   CL 105 03/30/2023   CREATININE 0.71 03/30/2023   BUN 13 03/30/2023   CO2 24 03/30/2023   TSH 5.78 (H) 03/30/2023   HGBA1C 5.7 11/16/2022   MICROALBUR 1.2 03/30/2023    MM 3D SCREENING MAMMOGRAM BILATERAL BREAST Result Date: 06/08/2023 CLINICAL DATA:  Screening. EXAM: DIGITAL SCREENING BILATERAL MAMMOGRAM WITH TOMOSYNTHESIS AND CAD TECHNIQUE: Bilateral  screening digital craniocaudal and mediolateral oblique mammograms were obtained. Bilateral screening digital breast tomosynthesis was performed. The images were evaluated with computer-aided detection. COMPARISON:  Previous exam(s). ACR Breast Density Category b: There are scattered areas of fibroglandular density. FINDINGS: There are no findings suspicious for  malignancy. IMPRESSION: No mammographic evidence of malignancy. A result letter of this screening mammogram will be mailed directly to the patient. RECOMMENDATION: Screening mammogram in one year. (Code:SM-B-01Y) BI-RADS CATEGORY  1: Negative. Electronically Signed   By: Amanda Jungling M.D.   On: 06/08/2023 15:09       Assessment & Plan:  B12 deficiency Assessment & Plan: Check vitamin B12 level today.   Orders: -     Vitamin B12  Abnormal liver function -     Hepatic function panel  Hyperlipidemia, mixed Assessment & Plan: Low cholesterol diet and exercise.  Discussed diet and exercise. Check lipid panel.   Orders: -     Hepatic function panel -     Lipid panel  Type 2 diabetes mellitus with hyperglycemia, without long-term current use of insulin  (HCC) Assessment & Plan: Continue low carb diet and exercise.  Discussed diet and exercise today.  Follow met b and A1c. Check labs today.  Lab Results  Component Value Date   HGBA1C 5.7 11/16/2022    Orders: -     Basic metabolic panel with GFR -     Hemoglobin A1c  Paroxysmal A-fib (HCC) Assessment & Plan: S/p ablation.  Off xarelto.  Afib/symptoms improved overall.  Follow pressures. Followed by cardiology. Some increase recently. Wants to hold on any further evaluation or intervention. Wants to discuss with Dr Theodis Fiscal.   Orders: -     T4, free -     TSH  Vitamin D  deficiency Assessment & Plan: Check vitamin D  level today.   Orders: -     VITAMIN D  25 Hydroxy (Vit-D Deficiency, Fractures)  OSA (obstructive sleep apnea) Assessment & Plan: Has oral device. Needs to use regularly. Discussed today.    Left-sided low back pain with left-sided sciatica, unspecified chronicity Assessment & Plan: History of back surgery. Recent flare. Discussed exercises/stretches. Avoind increased antiinflammatory medication given her blood pressure elevation.  Tylenol . Follow.  Discussed PT. Will notify me if desires further  intervention. No radicular symptoms.    Primary hypertension Assessment & Plan: Blood pressure remains elevated. Seeing Dr Theodis Fiscal. Recommended to increase hydralazine  to 50mg  tid. She did not tolerate this dose. Has been taking 35mg  tid. Tolerating.  Follow pressures. Plans to increase to 50mg  tid as tolerated. Follow metabolic panel. Discussed changing medication today. She wants to hold on changing medication today. Plans to f/u soon with Dr Theodis Fiscal soon. Follow pressures. Check metabolic panel.    Fatty liver Assessment & Plan: Diet and exercise. Check liver panel today.    Diarrhea, unspecified type Assessment & Plan: Previous stool studies negative.  She has had persistent problems with diarrhea. (Ongoing for years). Symptoms seem to have been more noticeable after cholecystectomy 08/2018.  Work up to date has included - TTgIGA negative.  Lipase wnl.  CT abd/pelvis - diverticulosis, otherwise negative. Colonoscopy negative for microscopic colitis.  Previous TSH/T4 negative.  No weight loss.  Eating.  No nausea or vomiting.   Have discussed history of loose stools.  Declines cholestyramine. Recent increase in stools. Discussed again - colestid. Notify me if changes her mind about starting coloestid. Has seen Dr Broadus Canes recently. Diagnosed with giardia and a tape worm. Currently on flagyl and biltricide.  Continues on intermittent doses of these two medications. Discussed again today. Wants to continue with above regimen.    Other orders -     Carvedilol ; Take 1 tablet (12.5 mg total) by mouth 2 (two) times daily with a meal.  Dispense: 180 tablet; Refill: 1     [name], MD

## 2023-07-14 NOTE — Assessment & Plan Note (Signed)
 Has oral device. Needs to use regularly. Discussed today.

## 2023-07-15 ENCOUNTER — Ambulatory Visit: Payer: Self-pay | Admitting: Internal Medicine

## 2023-07-23 DIAGNOSIS — E119 Type 2 diabetes mellitus without complications: Secondary | ICD-10-CM | POA: Diagnosis not present

## 2023-07-24 ENCOUNTER — Encounter: Payer: Self-pay | Admitting: Internal Medicine

## 2023-07-25 ENCOUNTER — Encounter (HOSPITAL_BASED_OUTPATIENT_CLINIC_OR_DEPARTMENT_OTHER): Payer: Self-pay | Admitting: Cardiovascular Disease

## 2023-07-25 ENCOUNTER — Ambulatory Visit (HOSPITAL_BASED_OUTPATIENT_CLINIC_OR_DEPARTMENT_OTHER): Admitting: Cardiovascular Disease

## 2023-07-25 VITALS — BP 146/90 | HR 75 | Ht 66.0 in | Wt 263.0 lb

## 2023-07-25 DIAGNOSIS — E782 Mixed hyperlipidemia: Secondary | ICD-10-CM

## 2023-07-25 DIAGNOSIS — I10 Essential (primary) hypertension: Secondary | ICD-10-CM

## 2023-07-25 DIAGNOSIS — I480 Paroxysmal atrial fibrillation: Secondary | ICD-10-CM

## 2023-07-25 DIAGNOSIS — G4733 Obstructive sleep apnea (adult) (pediatric): Secondary | ICD-10-CM | POA: Diagnosis not present

## 2023-07-25 NOTE — Progress Notes (Signed)
 Advanced Hypertension Follow Up:    Date:  07/25/2023   ID:  Joy Houston, DOB 12-01-1965, MRN [id number]  PCP:  [name], MD  Cardiologist:  [name], MD   Referring MD: [name], MD   CC: Hypertension  History of Present Illness:    Joy Houston is a 58 y.o. female with a hx of hypertension, OSA, and PAF here to establish care in the [facility].  Joy Houston has been struggling with her blood pressure and working with [name].  She has been on carvedilol  and hydralazine  and reported feeling orthostatic dizziness with positional changes.  This increased after increasing carvedilol .  She also noted having stress due to concern about her husband having an aortic aneurysm.  She reported issues with chronic diarrhea and concerns for malabsorption.  She reported that she felt her blood pressure issues were attributable to stress from her GI illness.  She was given hydralazine  to take as needed.  Carvedilol  has been helpful for both blood pressure and palpitations.  Dr. [name] previously recommended diuretics and the patient was reluctant due to her GI concerns.  She previously underwent atrial fibrillation/flutter ablation with Dr. [name] at [facility]. She struggles with intolerance to multiple BP medications.  At her visit 04/2023, hydralazine  was increased.    Discussed the use of AI scribe software for clinical note transcription with the patient, who gave verbal consent to proceed.  History of Present Illness Ms. Joy Houston experiences fluctuating blood pressure readings, with values ranging from the 120s to 150s/60s. Blood pressure tends to rise if medication is taken late. She is currently on carvedilol  and hydralazine , and has observed some improvement, though readings remain inconsistent.  She experiences periodic palpitations, particularly during a recent trip to a high elevation area, which resolved with breathing exercises. These episodes  are associated with fluid and electrolyte imbalances due to frequent diarrhea. She has a history of cardiac ablation for similar symptoms in the past.  She mentions low vitamin D  levels and believes she has vitamin and mineral deficiencies due to gastrointestinal issues. She is actively trying to replenish electrolytes and takes magnesium supplements.  She experiences occasional swelling in her legs, particularly during long travel periods, but notes it was not as severe on her recent trip.  She reports some pain under her liver area, which she has experienced intermittently since her gallbladder removal. Liver function tests are slightly elevated, but she has previously managed to normalize them through dietary changes.  She is attempting to improve her diet and has been more active recently, walking regularly. She is mindful of her salt intake, using sea salt sparingly. No new chest pain, shortness of breath, or unexplained swelling.  Previous antihypertensives: ACE inhibitor-angioedema Amlodipine -itching Benicar  Past Medical History:  Diagnosis Date   Anemia    Angio-edema    Atrial fibrillation (HCC)    Complication of anesthesia    Diabetes mellitus without complication (HCC)    diet controlled   Dysplastic nevus 06/27/2018   Left upper back. Moderate atypia, limited margins free.    Eczema    Epstein Barr infection    Family history of anesthesia complication    Mother - confusion   Fatty liver    Heart murmur    Slight - "nothing to worry about"   Hemorrhoid    Hypertension    Hypothyroidism    PONV (postoperative nausea and vomiting)    "only with c-sections"   Sleep apnea  mild    Past Surgical History:  Procedure Laterality Date   CESAREAN SECTION     CHOLECYSTECTOMY N/A 09/07/2018   Procedure: LAPAROSCOPIC CHOLECYSTECTOMY;  Surgeon: Eldred Grego, MD;  Location: [facility] ORS;  Service: General;  Laterality: N/A;   FINGER SURGERY     pin to 3rd finger  Right hand   LUMBAR LAMINECTOMY/DECOMPRESSION MICRODISCECTOMY  12/15/2011   Procedure: LUMBAR LAMINECTOMY/DECOMPRESSION MICRODISCECTOMY 1 LEVEL;  Surgeon: Elder Greening, MD;  Location: [facility];  Service: Neurosurgery;  Laterality: Right;  RIGHT Lumbar four-Five diskectomy   LUMBAR LAMINECTOMY/DECOMPRESSION MICRODISCECTOMY N/A 11/04/2019   Procedure: MICRODISCECTOMY LEFT LUMBAR FIVE SACRAL ONE;  Surgeon: Garry Kansas, MD;  Location: [facility] OR;  Service: Neurosurgery;  Laterality: N/A;  3C   MICRODISCECTOMY LUMBAR     L4-5   WISDOM TOOTH EXTRACTION      Current Medications: Current Meds  Medication Sig   carvedilol  (COREG ) 12.5 MG tablet Take 1 tablet (12.5 mg total) by mouth 2 (two) times daily with a meal.   EPINEPHrine  (EPIPEN  2-PAK) 0.3 mg/0.3 mL IJ SOAJ injection Inject 0.3 mg into the muscle as needed for anaphylaxis.   hydrALAZINE  (APRESOLINE ) 10 MG tablet Take 1 tablet (10 mg total) by mouth 3 (three) times daily. To take with the 25 mg tablets   hydrALAZINE  (APRESOLINE ) 25 MG tablet Take 1 tablet (25 mg total) by mouth 3 (three) times daily. To take with the 10 mg tablets   hydrocortisone  2.5 % cream Apply twice a day for up to 2 weeks as needed for rash.   ketoconazole  (NIZORAL ) 2 % cream Apply twice a day as needed for rash.   Magnesium Bisglycinate Dihyd POWD Take 400 mg by mouth See admin instructions. Pt takes 2-4 times per week   VITAMIN D  PO Take by mouth.     Allergies:   Angiotensin receptor blockers, Lisinopril , Amlodipine  besylate, Gluten meal, and Morphine  and codeine   Social History   Socioeconomic History   Marital status: Married    Spouse name: Not on file   Number of children: 2   Years of education: Not on file   Highest education level: Bachelor's degree (e.g., BA, AB, BS)  Occupational History    Employer: [employer]  Tobacco Use   Smoking status: Never   Smokeless tobacco: Never  Vaping Use   Vaping status: Never Used  Substance and  Sexual Activity   Alcohol use: Not Currently    Alcohol/week: 0.0 standard drinks of alcohol   Drug use: No   Sexual activity: Yes    Partners: Male    Comment: married  Other Topics Concern   Not on file  Social History Narrative   Nurse    Married   Social Drivers of Health   Financial Resource Strain: Low Risk  (04/24/2023)   Overall Financial Resource Strain (CARDIA)    Difficulty of Paying Living Expenses: Not hard at all  Food Insecurity: No Food Insecurity (04/24/2023)   Hunger Vital Sign    Worried About Running Out of Food in the Last Year: Never true    Ran Out of Food in the Last Year: Never true  Transportation Needs: No Transportation Needs (04/24/2023)   PRAPARE - Administrator, Civil Service (Medical): No    Lack of Transportation (Non-Medical): No  Physical Activity: Unknown (04/24/2023)   Exercise Vital Sign    Days of Exercise per Week: Patient declined    Minutes of Exercise per Session: Not on file  Stress: Patient Declined (04/24/2023)   [employer] of Occupational Health - Occupational Stress Questionnaire    Feeling of Stress : Patient declined  Social Connections: Socially Integrated (04/24/2023)   Social Connection and Isolation Panel [NHANES]    Frequency of Communication with Friends and Family: More than three times a week    Frequency of Social Gatherings with Friends and Family: More than three times a week    Attends Religious Services: More than 4 times per year    Active Member of [employer] or Organizations: Yes    Attends Engineer, structural: More than 4 times per year    Marital Status: Married    Family History: The patient's family history includes Breast cancer (age of onset: 81) in her sister; Cancer in her father; Diabetes in her father and paternal grandfather; GER disease in her mother; Heart attack in her maternal grandfather; Heart disease in her father and maternal grandfather; Heart failure in her father;  Hypertension in her father and mother; Stroke in her maternal grandmother.  ROS:   Please see the history of present illness.     All other systems reviewed and are negative.  EKGs/Labs/Other Studies Reviewed:    EKG:  EKG is not ordered today.    Recent Labs: 03/30/2023: Hemoglobin 14.5; Magnesium 1.8; Platelets 265.0 07/14/2023: ALT 40; BUN 12; Creatinine, Ser 0.63; Potassium 4.2; Sodium 138; TSH 5.70   Recent Lipid Panel    Component Value Date/Time   CHOL 205 (H) 07/14/2023 0845   TRIG 112.0 07/14/2023 0845   HDL 61.60 07/14/2023 0845   CHOLHDL 3 07/14/2023 0845   VLDL 22.4 07/14/2023 0845   LDLCALC 121 (H) 07/14/2023 0845   LDLDIRECT 145.4 03/01/2013 0913    Physical Exam:   VS:  BP (!) 146/90   Pulse 75   Ht 5\' 6"  (1.676 m)   Wt 263 lb (119.3 kg)   SpO2 96%   BMI 42.45 kg/m  , BMI Body mass index is 42.45 kg/m. GENERAL:  Well appearing HEENT: Pupils equal round and reactive, fundi not visualized, oral mucosa unremarkable NECK:  No jugular venous distention, waveform within normal limits, carotid upstroke brisk and symmetric, no bruits, no thyromegaly LUNGS:  Clear to auscultation bilaterally HEART:  RRR.  PMI not displaced or sustained,S1 and S2 within normal limits, no S3, no S4, no clicks, no rubs, no murmurs ABD:  Flat, positive bowel sounds normal in frequency in pitch, no bruits, no rebound, no guarding, no midline pulsatile mass, no hepatomegaly, no splenomegaly EXT:  2 plus pulses throughout, no edema, no cyanosis no clubbing SKIN:  No rashes no nodules NEURO:  Cranial nerves II through XII grossly intact, motor grossly intact throughout PSYCH:  Cognitively intact, oriented to person place and time   ASSESSMENT/PLAN:    Assessment & Plan # Hypertension Fluctuating blood pressure, inconsistent control possibly due to medication timing and lifestyle factors. Adjusted hydralazine  to 30 mg TID due to sensitivity.  BP seems similar to prior.  She didn't  tolerate higher doses of hydralazine  and wants to work on exercise prior to making more medication changes.  - Continue carvedilol  and hydralazine  35 mg TID. - Encourage regular walking, 2-3 miles daily. - Reassess blood pressure in 3 months. Consider more aggressive treatment if elevated.  # Palpitations Intermittent palpitations possibly related to fluid and electrolyte imbalances from chronic diarrhea. - Manage with breathing exercises. - Consider echocardiogram if new symptoms develop.    Screening for Secondary Hypertension:  05/17/2023   11:42 AM  Causes  Drugs/Herbals Screened     - Comments tries to limit salt.  humic acid supplement.  no EtOH/tobacco.    Relevant Labs/Studies:    Latest Ref Rng & Units 07/14/2023    8:45 AM 03/30/2023    7:56 AM 11/16/2022    7:43 AM  Basic Labs  Sodium 135 - 145 mEq/L 138  138  139   Potassium 3.5 - 5.1 mEq/L 4.2  4.1  3.9   Creatinine 0.40 - 1.20 mg/dL 7.84  6.96  2.95        Latest Ref Rng & Units 07/14/2023    8:45 AM 03/30/2023    7:56 AM  Thyroid    TSH 0.35 - 5.50 uIU/mL 5.70  5.78     Disposition:    FU with MD/PharmD in 3 months    Medication Adjustments/Labs and Tests Ordered: Current medicines are reviewed at length with the patient today.  Concerns regarding medicines are outlined above.  No orders of the defined types were placed in this encounter.  No orders of the defined types were placed in this encounter.    Signed, Maudine Sos, MD  07/25/2023 3:07 PM    [facility]

## 2023-07-25 NOTE — Patient Instructions (Addendum)
 Medication Instructions:  Your physician recommends that you continue on your current medications as directed. Please refer to the Current Medication list given to you today.   Labwork: NONE  Testing/Procedures: NONE  Follow-Up: 3 MONTHS WITH DR [place] OR CAITLIN W NP   Any Other Special Instructions Will Be Listed Below (If Applicable). CONTINUE TO WORK ON DIET AND EXERCISE   If you need a refill on your cardiac medications before your next appointment, please call your pharmacy.

## 2023-08-15 ENCOUNTER — Other Ambulatory Visit (HOSPITAL_BASED_OUTPATIENT_CLINIC_OR_DEPARTMENT_OTHER): Payer: Self-pay | Admitting: Cardiovascular Disease

## 2023-08-22 DIAGNOSIS — E119 Type 2 diabetes mellitus without complications: Secondary | ICD-10-CM | POA: Diagnosis not present

## 2023-09-05 DIAGNOSIS — B3731 Acute candidiasis of vulva and vagina: Secondary | ICD-10-CM | POA: Diagnosis not present

## 2023-09-05 DIAGNOSIS — L03113 Cellulitis of right upper limb: Secondary | ICD-10-CM | POA: Diagnosis not present

## 2023-09-06 ENCOUNTER — Ambulatory Visit: Admitting: Dermatology

## 2023-09-06 ENCOUNTER — Encounter: Payer: Self-pay | Admitting: Dermatology

## 2023-09-06 DIAGNOSIS — L02413 Cutaneous abscess of right upper limb: Secondary | ICD-10-CM

## 2023-09-06 DIAGNOSIS — L0291 Cutaneous abscess, unspecified: Secondary | ICD-10-CM

## 2023-09-06 MED ORDER — DOXYCYCLINE MONOHYDRATE 100 MG PO CAPS
100.0000 mg | ORAL_CAPSULE | Freq: Two times a day (BID) | ORAL | 0 refills | Status: AC
Start: 2023-09-06 — End: 2023-09-16

## 2023-09-06 NOTE — Progress Notes (Unsigned)
   Follow-Up Visit   Subjective  Joy Houston is a 58 y.o. female who presents for the following: nodule R axilla, ~4-5 days, pt went to Urgent Care yesterday and they started her on Cefdinir 300mg  1 po bid x 7 days, has taken 3 doses so far, painful, no hx of draining, pt with hx of cyst in axilla   The following portions of the chart were reviewed this encounter and updated as appropriate: medications, allergies, medical history  Review of Systems:  No other skin or systemic complaints except as noted in HPI or Assessment and Plan.  Objective  Well appearing patient in no apparent distress; mood and affect are within normal limits.   A focused examination was performed of the following areas: Right axilla  Relevant exam findings are noted in the Assessment and Plan.  Right upper medial arm Erythematous edematous hyperemic plaque  Assessment & Plan     ABSCESS Right upper medial arm Abscess vs inflamed cyst (patient has had inflamed cysts in this location in the past)  Attempted to do aerobic culture but not enough purulence was expressed D/C Cefdinir Start Doxycycline  100mg  1 po bid with food and drink for 10 days. Switching to cover MRSA  Doxycycline  should be taken with food to prevent nausea. Do not lay down for 30 minutes after taking. Be cautious with sun exposure and use good sun protection while on this medication. Pregnant women should not take this medication.   Incision and Drainage - Right upper medial arm Location: R upper medial arm  Informed Consent: Discussed risks (permanent scarring, light or dark discoloration, infection, pain, bleeding, bruising, redness, damage to adjacent structures, and recurrence of the lesion) and benefits of the procedure, as well as the alternatives.  Informed consent was obtained.  Preparation: The area was prepped with alcohol.  Anesthesia: Lidocaine  1% without epinephrine   Procedure Details: An incision was made  overlying the lesion. The lesion drained blood.  A small amount of cyst material was drained.    Antibiotic ointment and a sterile pressure dressing were applied. The patient tolerated procedure well.  Total number of lesions drained: 1  Plan: The patient was instructed on post-op care. Recommend OTC analgesia as needed for pain.    Return for as scheduled for [facility]SE.  I, Grayce Saunas, RMA, am acting as scribe for Boneta Sharps, MD .   Documentation: I have reviewed the above documentation for accuracy and completeness, and I agree with the above.  Boneta Sharps, MD

## 2023-09-06 NOTE — Patient Instructions (Signed)

## 2023-09-12 ENCOUNTER — Encounter: Payer: Self-pay | Admitting: Internal Medicine

## 2023-09-12 ENCOUNTER — Encounter: Admitting: Internal Medicine

## 2023-09-12 NOTE — Progress Notes (Deleted)
 Subjective:    Patient ID: Joy Houston, female    DOB: 1965/09/15, 58 y.o.   MRN: [id number]  Patient here for No chief complaint on file.   HPI Did not show for appt.    Past Medical History:  Diagnosis Date   Anemia    Angio-edema    Atrial fibrillation (HCC)    Complication of anesthesia    Diabetes mellitus without complication (HCC)    diet controlled   Dysplastic nevus 06/27/2018   Left upper back. Moderate atypia, limited margins free.    Eczema    Epstein Barr infection    Family history of anesthesia complication    Mother - confusion   Fatty liver    Heart murmur    Slight - nothing to worry about   Hemorrhoid    Hypertension    Hypothyroidism    PONV (postoperative nausea and vomiting)    only with c-sections   Sleep apnea    mild   Past Surgical History:  Procedure Laterality Date   CESAREAN SECTION     CHOLECYSTECTOMY N/A 09/07/2018   Procedure: LAPAROSCOPIC CHOLECYSTECTOMY;  Surgeon: Rodolph Romano, MD;  Location: [facility] ORS;  Service: General;  Laterality: N/A;   FINGER SURGERY     pin to 3rd finger Right hand   LUMBAR LAMINECTOMY/DECOMPRESSION MICRODISCECTOMY  12/15/2011   Procedure: LUMBAR LAMINECTOMY/DECOMPRESSION MICRODISCECTOMY 1 LEVEL;  Surgeon: Reyes JONETTA Budge, MD;  Location: [facility];  Service: Neurosurgery;  Laterality: Right;  RIGHT Lumbar four-Five diskectomy   LUMBAR LAMINECTOMY/DECOMPRESSION MICRODISCECTOMY N/A 11/04/2019   Procedure: MICRODISCECTOMY LEFT LUMBAR FIVE SACRAL ONE;  Surgeon: Budge Reyes, MD;  Location: [facility] OR;  Service: Neurosurgery;  Laterality: N/A;  3C   MICRODISCECTOMY LUMBAR     L4-5   WISDOM TOOTH EXTRACTION     Family History  Problem Relation Age of Onset   Hypertension Mother    GER disease Mother    Heart failure Father    Hypertension Father    Diabetes Father    Cancer Father        Melanoma, Prostate   Heart disease Father    Breast cancer Sister 39   Stroke Maternal  Grandmother    Heart attack Maternal Grandfather    Heart disease Maternal Grandfather    Diabetes Paternal Grandfather    Social History   Socioeconomic History   Marital status: Married    Spouse name: Not on file   Number of children: 2   Years of education: Not on file   Highest education level: Bachelor's degree (e.g., BA, AB, BS)  Occupational History    Employer: [employer]  Tobacco Use   Smoking status: Never   Smokeless tobacco: Never  Vaping Use   Vaping status: Never Used  Substance and Sexual Activity   Alcohol use: Not Currently    Alcohol/week: 0.0 standard drinks of alcohol   Drug use: No   Sexual activity: Yes    Partners: Male    Comment: married  Other Topics Concern   Not on file  Social History Narrative   Nurse    Married   Social Drivers of Health   Financial Resource Strain: Low Risk  (04/24/2023)   Overall Financial Resource Strain (CARDIA)    Difficulty of Paying Living Expenses: Not hard at all  Food Insecurity: No Food Insecurity (04/24/2023)   Hunger Vital Sign    Worried About Running Out of Food in the Last Year: Never true  Ran Out of Food in the Last Year: Never true  Transportation Needs: No Transportation Needs (04/24/2023)   PRAPARE - Administrator, Civil Service (Medical): No    Lack of Transportation (Non-Medical): No  Physical Activity: Unknown (04/24/2023)   Exercise Vital Sign    Days of Exercise per Week: Patient declined    Minutes of Exercise per Session: Not on file  Stress: Patient Declined (04/24/2023)   [employer] of Occupational Health - Occupational Stress Questionnaire    Feeling of Stress : Patient declined  Social Connections: Socially Integrated (04/24/2023)   Social Connection and Isolation Panel    Frequency of Communication with Friends and Family: More than three times a week    Frequency of Social Gatherings with Friends and Family: More than three times a week    Attends Religious  Services: More than 4 times per year    Active Member of [employer] or Organizations: Yes    Attends Engineer, structural: More than 4 times per year    Marital Status: Married     Review of Systems     Objective:     There were no vitals taken for this visit. Wt Readings from Last 3 Encounters:  07/25/23 263 lb (119.3 kg)  07/14/23 265 lb 12.8 oz (120.6 kg)  05/23/23 260 lb (117.9 kg)    Physical Exam  {Perform Simple Foot Exam  Perform Detailed exam:1} {Insert foot Exam (Optional):30965}   Outpatient Encounter Medications as of 09/12/2023  Medication Sig   carvedilol  (COREG ) 12.5 MG tablet Take 1 tablet (12.5 mg total) by mouth 2 (two) times daily with a meal.   cefdinir (OMNICEF) 300 MG capsule Take 300 mg by mouth.   doxycycline  (MONODOX ) 100 MG capsule Take 1 capsule (100 mg total) by mouth 2 (two) times daily for 10 days. Take with food and drink   EPINEPHrine  (EPIPEN  2-PAK) 0.3 mg/0.3 mL IJ SOAJ injection Inject 0.3 mg into the muscle as needed for anaphylaxis.   hydrALAZINE  (APRESOLINE ) 10 MG tablet TAKE 1 TABLET (10 MG TOTAL) BY MOUTH 3 (THREE) TIMES DAILY. TO TAKE WITH THE 25 MG TABLETS   hydrALAZINE  (APRESOLINE ) 25 MG tablet Take 1 tablet (25 mg total) by mouth 3 (three) times daily. To take with the 10 mg tablets   hydrocortisone  2.5 % cream Apply twice a day for up to 2 weeks as needed for rash.   ketoconazole  (NIZORAL ) 2 % cream Apply twice a day as needed for rash.   Magnesium Bisglycinate Dihyd POWD Take 400 mg by mouth See admin instructions. Pt takes 2-4 times per week   VITAMIN D  PO Take by mouth.   No facility-administered encounter medications on file as of 09/12/2023.     Lab Results  Component Value Date   WBC 7.5 03/30/2023   HGB 14.5 03/30/2023   HCT 42.8 03/30/2023   PLT 265.0 03/30/2023   GLUCOSE 133 (H) 07/14/2023   CHOL 205 (H) 07/14/2023   TRIG 112.0 07/14/2023   HDL 61.60 07/14/2023   LDLDIRECT 145.4 03/01/2013   LDLCALC 121 (H)  07/14/2023   ALT 40 (H) 07/14/2023   AST 26 07/14/2023   NA 138 07/14/2023   K 4.2 07/14/2023   CL 102 07/14/2023   CREATININE 0.63 07/14/2023   BUN 12 07/14/2023   CO2 26 07/14/2023   TSH 5.70 (H) 07/14/2023   HGBA1C 6.3 07/14/2023    MM 3D SCREENING MAMMOGRAM BILATERAL BREAST Result Date: 06/08/2023 CLINICAL DATA:  Screening. EXAM: DIGITAL SCREENING BILATERAL MAMMOGRAM WITH TOMOSYNTHESIS AND CAD TECHNIQUE: Bilateral screening digital craniocaudal and mediolateral oblique mammograms were obtained. Bilateral screening digital breast tomosynthesis was performed. The images were evaluated with computer-aided detection. COMPARISON:  Previous exam(s). ACR Breast Density Category b: There are scattered areas of fibroglandular density. FINDINGS: There are no findings suspicious for malignancy. IMPRESSION: No mammographic evidence of malignancy. A result letter of this screening mammogram will be mailed directly to the patient. RECOMMENDATION: Screening mammogram in one year. (Code:SM-B-01Y) BI-RADS CATEGORY  1: Negative. Electronically Signed   By: Alm Parkins M.D.   On: 06/08/2023 15:09       Assessment & Plan:  There are no diagnoses linked to this encounter.   Allena Hamilton, MD

## 2023-09-12 NOTE — Assessment & Plan Note (Deleted)
 Physical today 09/12/23.  PAP 05/10/21 - negative with negative HPV. Mammogram 06/06/23 - Birads I.  Colonoscopy 12/31/20.

## 2023-09-18 DIAGNOSIS — N951 Menopausal and female climacteric states: Secondary | ICD-10-CM | POA: Diagnosis not present

## 2023-09-18 DIAGNOSIS — R5383 Other fatigue: Secondary | ICD-10-CM | POA: Diagnosis not present

## 2023-09-18 DIAGNOSIS — R7301 Impaired fasting glucose: Secondary | ICD-10-CM | POA: Diagnosis not present

## 2023-09-18 DIAGNOSIS — E559 Vitamin D deficiency, unspecified: Secondary | ICD-10-CM | POA: Diagnosis not present

## 2023-09-18 DIAGNOSIS — Z1321 Encounter for screening for nutritional disorder: Secondary | ICD-10-CM | POA: Diagnosis not present

## 2023-09-18 DIAGNOSIS — E039 Hypothyroidism, unspecified: Secondary | ICD-10-CM | POA: Diagnosis not present

## 2023-09-22 DIAGNOSIS — E119 Type 2 diabetes mellitus without complications: Secondary | ICD-10-CM | POA: Diagnosis not present

## 2023-10-02 DIAGNOSIS — E559 Vitamin D deficiency, unspecified: Secondary | ICD-10-CM | POA: Diagnosis not present

## 2023-10-02 DIAGNOSIS — R5383 Other fatigue: Secondary | ICD-10-CM | POA: Diagnosis not present

## 2023-10-02 DIAGNOSIS — G4700 Insomnia, unspecified: Secondary | ICD-10-CM | POA: Diagnosis not present

## 2023-10-02 DIAGNOSIS — N951 Menopausal and female climacteric states: Secondary | ICD-10-CM | POA: Diagnosis not present

## 2023-10-22 ENCOUNTER — Other Ambulatory Visit: Payer: Self-pay | Admitting: Internal Medicine

## 2023-10-23 DIAGNOSIS — E119 Type 2 diabetes mellitus without complications: Secondary | ICD-10-CM | POA: Diagnosis not present

## 2023-10-24 ENCOUNTER — Encounter: Payer: Self-pay | Admitting: Internal Medicine

## 2023-10-25 NOTE — Telephone Encounter (Signed)
 Notified rx already sent in to pharmacy.

## 2023-11-10 ENCOUNTER — Other Ambulatory Visit (HOSPITAL_BASED_OUTPATIENT_CLINIC_OR_DEPARTMENT_OTHER): Payer: Self-pay | Admitting: Cardiovascular Disease

## 2023-11-13 ENCOUNTER — Encounter (HOSPITAL_BASED_OUTPATIENT_CLINIC_OR_DEPARTMENT_OTHER): Payer: Self-pay | Admitting: Cardiovascular Disease

## 2023-11-13 ENCOUNTER — Ambulatory Visit (HOSPITAL_BASED_OUTPATIENT_CLINIC_OR_DEPARTMENT_OTHER): Admitting: Cardiovascular Disease

## 2023-11-13 VITALS — BP 154/106 | HR 75 | Resp 17 | Ht 66.0 in | Wt 260.0 lb

## 2023-11-13 DIAGNOSIS — I480 Paroxysmal atrial fibrillation: Secondary | ICD-10-CM | POA: Diagnosis not present

## 2023-11-13 DIAGNOSIS — E782 Mixed hyperlipidemia: Secondary | ICD-10-CM

## 2023-11-13 DIAGNOSIS — G4733 Obstructive sleep apnea (adult) (pediatric): Secondary | ICD-10-CM

## 2023-11-13 DIAGNOSIS — I10 Essential (primary) hypertension: Secondary | ICD-10-CM

## 2023-11-13 NOTE — Patient Instructions (Signed)
 Medication Instructions:  Your physician recommends that you continue on your current medications as directed. Please refer to the Current Medication list given to you today.   Labwork: BMET TODAY   Testing/Procedures: NONE  Follow-Up: 3 MONTHS WITH DR [place], CAITLIN W NP, OR PHARM D   If you need a refill on your cardiac medications before your next appointment, please call your pharmacy.

## 2023-11-13 NOTE — Progress Notes (Signed)
 Advanced Hypertension Follow Up:    Date:  11/13/2023   ID:  Joy Houston, DOB March 19, 1965, MRN [id number]  PCP:  Glendia Shad, MD  Cardiologist:  Soyla DELENA Merck, MD   Referring MD: Glendia Shad, MD   CC: Hypertension  History of Present Illness:    Joy Houston is a 58 y.o. female with a hx of hypertension, OSA, and PAF here for follow up.  She was first seen in [facility] HTN clinic 04/2023.  Joy Houston has been struggling with her blood pressure and working with Dr. Merck.  She has been on carvedilol  and hydralazine  and reported feeling orthostatic dizziness with positional changes.  This increased after increasing carvedilol .  She also noted having stress due to concern about her husband having an aortic aneurysm.  She reported issues with chronic diarrhea and concerns for malabsorption.  She reported that she felt her blood pressure issues were attributable to stress from her GI illness.  She was given hydralazine  to take as needed.  Carvedilol  has been helpful for both blood pressure and palpitations.  Dr. [name] previously recommended diuretics and the patient was reluctant due to her GI concerns.  She previously underwent atrial fibrillation/flutter ablation with Dr. Debby at [facility]. She struggles with intolerance to multiple BP medications.  At her visit 04/2023, hydralazine  was increased.  She has not tolerated higher doses of hydralazine .  At her visit 07/2023 blood pressures were ranging from the 120s to the 150s and she noted palpitations.  She wanted to work on exercise rather than increasing her medications.  Discussed the use of AI scribe software for clinical note transcription with the patient, who gave verbal consent to proceed.  History of Present Illness Joy Houston notes that she has been experiencing fluctuating blood pressure readings, with recent measurements around 140/90 mmHg. She is currently on carvedilol  and hydralazine  taking it three times a day, and is  attempting to lose weight to aid in blood pressure management. Her mother also has hypertension.  She reports swelling around her ankles, which she attributes to inflammation. She previously took low dose naltrexone, which helped with inflammation and weight management, but discontinued it due to vivid dreams. Since stopping the medication about a year ago, she has gained approximately 30 pounds. She is concerned about fluid retention and swelling, especially when traveling, and uses compression stockings to manage this. Her father had congestive heart failure at age 80.  She denies shortness of breath, orthopnea or PND.    She has a history of thyroid  issues and bile acid malabsorption, which leads to dehydration and heart palpitations. She manages dehydration with electrolytes and notes that dehydration exacerbates her palpitations, sometimes experiencing premature ventricular contractions (PVCs).  Her current lifestyle changes include a diet focused on protein and fruits, with reduced fats and carbohydrates. She acknowledges needing more exercise, noting that she was more active during a recent cruise. She experiences difficulty with steps due to arthritis. No shortness of breath when lying down, but she reports heart palpitations and swelling around ankles.  Previous antihypertensives: ACE inhibitor-angioedema Amlodipine -itching Benicar Diuretics patient declines due to patient preference.  She has not tried them Hydralazine - hasn't tolerated higher doses  Past Medical History:  Diagnosis Date   Anemia    Angio-edema    Atrial fibrillation (HCC)    Complication of anesthesia    Diabetes mellitus without complication (HCC)    diet controlled   Dysplastic nevus 06/27/2018   Left upper back. Moderate atypia, limited  margins free.    Eczema    Epstein Barr infection    Family history of anesthesia complication    Mother - confusion   Fatty liver    Heart murmur    Slight - nothing to  worry about   Hemorrhoid    Hypertension    Hypothyroidism    PONV (postoperative nausea and vomiting)    only with c-sections   Sleep apnea    mild    Past Surgical History:  Procedure Laterality Date   CESAREAN SECTION     CHOLECYSTECTOMY N/A 09/07/2018   Procedure: LAPAROSCOPIC CHOLECYSTECTOMY;  Surgeon: Rodolph Romano, MD;  Location: [facility] ORS;  Service: General;  Laterality: N/A;   FINGER SURGERY     pin to 3rd finger Right hand   LUMBAR LAMINECTOMY/DECOMPRESSION MICRODISCECTOMY  12/15/2011   Procedure: LUMBAR LAMINECTOMY/DECOMPRESSION MICRODISCECTOMY 1 LEVEL;  Surgeon: Reyes JONETTA Budge, MD;  Location: [facility];  Service: Neurosurgery;  Laterality: Right;  RIGHT Lumbar four-Five diskectomy   LUMBAR LAMINECTOMY/DECOMPRESSION MICRODISCECTOMY N/A 11/04/2019   Procedure: MICRODISCECTOMY LEFT LUMBAR FIVE SACRAL ONE;  Surgeon: Budge Reyes, MD;  Location: [facility] OR;  Service: Neurosurgery;  Laterality: N/A;  3C   MICRODISCECTOMY LUMBAR     L4-5   WISDOM TOOTH EXTRACTION      Current Medications: Current Meds  Medication Sig   carvedilol  (COREG ) 12.5 MG tablet Take 1 tablet (12.5 mg total) by mouth 2 (two) times daily with a meal.   EPINEPHrine  (EPIPEN  2-PAK) 0.3 mg/0.3 mL IJ SOAJ injection Inject 0.3 mg into the muscle as needed for anaphylaxis.   hydrALAZINE  (APRESOLINE ) 10 MG tablet TAKE 1 TABLET (10 MG TOTAL) BY MOUTH 3 (THREE) TIMES DAILY. TO TAKE WITH THE 25 MG TABLETS   hydrALAZINE  (APRESOLINE ) 25 MG tablet TAKE 1 TABLET (25 MG TOTAL) BY MOUTH 3 (THREE) TIMES DAILY. TO TAKE WITH THE 10 MG TABLETS   hydrocortisone  (ANUSOL -HC) 25 MG suppository PLACE 1 SUPPOSITORY RECTALLY 2 (TWO) TIMES DAILY AS NEEDED FOR HEMORRHOIDS OR ANAL ITCHING.   hydrocortisone  2.5 % cream Apply twice a day for up to 2 weeks as needed for rash.   ketoconazole  (NIZORAL ) 2 % cream Apply twice a day as needed for rash.   Magnesium Bisglycinate Dihyd POWD Take 400 mg by mouth See admin  instructions. Pt takes 2-4 times per week   VITAMIN D  PO Take by mouth.     Allergies:   Angiotensin receptor blockers, Lisinopril , Amlodipine  besylate, Gluten meal, and Morphine  and codeine   Social History   Socioeconomic History   Marital status: Married    Spouse name: Not on file   Number of children: 2   Years of education: Not on file   Highest education level: Bachelor's degree (e.g., BA, AB, BS)  Occupational History    Employer: [employer]  Tobacco Use   Smoking status: Never   Smokeless tobacco: Never  Vaping Use   Vaping status: Never Used  Substance and Sexual Activity   Alcohol use: Not Currently    Alcohol/week: 0.0 standard drinks of alcohol   Drug use: No   Sexual activity: Yes    Partners: Male    Comment: married  Other Topics Concern   Not on file  Social History Narrative   Nurse    Married   Social Drivers of Health   Financial Resource Strain: Low Risk  (04/24/2023)   Overall Financial Resource Strain (CARDIA)    Difficulty of Paying Living Expenses: Not hard at all  Food Insecurity: No Food Insecurity (04/24/2023)   Hunger Vital Sign    Worried About Running Out of Food in the Last Year: Never true    Ran Out of Food in the Last Year: Never true  Transportation Needs: No Transportation Needs (04/24/2023)   PRAPARE - Administrator, Civil Service (Medical): No    Lack of Transportation (Non-Medical): No  Physical Activity: Unknown (04/24/2023)   Exercise Vital Sign    Days of Exercise per Week: Patient declined    Minutes of Exercise per Session: Not on file  Stress: Patient Declined (04/24/2023)   [employer] of Occupational Health - Occupational Stress Questionnaire    Feeling of Stress : Patient declined  Social Connections: Socially Integrated (04/24/2023)   Social Connection and Isolation Panel    Frequency of Communication with Friends and Family: More than three times a week    Frequency of Social Gatherings with  Friends and Family: More than three times a week    Attends Religious Services: More than 4 times per year    Active Member of [employer] or Organizations: Yes    Attends Engineer, structural: More than 4 times per year    Marital Status: Married    Family History: The patient's family history includes Breast cancer (age of onset: 80) in her sister; Cancer in her father; Diabetes in her father and paternal grandfather; GER disease in her mother; Heart attack in her maternal grandfather; Heart disease in her father and maternal grandfather; Heart failure in her father; Hypertension in her father and mother; Stroke in her maternal grandmother.  ROS:   Please see the history of present illness.     All other systems reviewed and are negative.  EKGs/Labs/Other Studies Reviewed:    EKG:  EKG is not ordered today.    Echo 09/05/22: IMPRESSIONS    1. Left ventricular ejection fraction, by estimation, is 55 to 60%. The  left ventricle has normal function. The left ventricle has no regional  wall motion abnormalities. Left ventricular diastolic parameters are  consistent with Grade I diastolic  dysfunction (impaired relaxation).   2. Right ventricular systolic function is normal. The right ventricular  size is normal.   3. Left atrial size was mild to moderately dilated.   4. The mitral valve is normal in structure. Trivial mitral valve  regurgitation. No evidence of mitral stenosis.   5. The aortic valve is normal in structure. Aortic valve regurgitation is  not visualized. No aortic stenosis is present.   6. The inferior vena cava is dilated in size with >50% respiratory  variability, suggesting right atrial pressure of 8 mmHg.   Recent Labs: 03/30/2023: Hemoglobin 14.5; Magnesium 1.8; Platelets 265.0 07/14/2023: ALT 40; BUN 12; Creatinine, Ser 0.63; Potassium 4.2; Sodium 138; TSH 5.70   Recent Lipid Panel    Component Value Date/Time   CHOL 205 (H) 07/14/2023 0845   TRIG 112.0  07/14/2023 0845   HDL 61.60 07/14/2023 0845   CHOLHDL 3 07/14/2023 0845   VLDL 22.4 07/14/2023 0845   LDLCALC 121 (H) 07/14/2023 0845   LDLDIRECT 145.4 03/01/2013 0913    Physical Exam:   VS:  BP (!) 140/90 (BP Location: Left Arm, Patient Position: Sitting, Cuff Size: Large)   Pulse 75   Resp 17   Ht 5' 6 (1.676 m)   Wt 260 lb (117.9 kg)   SpO2 97%   BMI 41.97 kg/m  , BMI Body mass index is 41.97 kg/m.  GENERAL:  Well appearing HEENT: Pupils equal round and reactive, fundi not visualized, oral mucosa unremarkable NECK:  No jugular venous distention, waveform within normal limits, carotid upstroke brisk and symmetric, no bruits, no thyromegaly LUNGS:  Clear to auscultation bilaterally HEART:  RRR.  PMI not displaced or sustained,S1 and S2 within normal limits, no S3, no S4, no clicks, no rubs, no murmurs ABD:  Flat, positive bowel sounds normal in frequency in pitch, no bruits, no rebound, no guarding, no midline pulsatile mass, no hepatomegaly, no splenomegaly EXT:  2 plus pulses throughout, no edema, no cyanosis no clubbing SKIN:  No rashes no nodules NEURO:  Cranial nerves II through XII grossly intact, motor grossly intact throughout PSYCH:  Cognitively intact, oriented to person place and time   ASSESSMENT/PLAN:    Assessment & Plan # Primary hypertension in the setting of obesity Blood pressure around 140/90 mmHg. Prefers lifestyle modifications over additional medications. - Encourage weight loss through dietary changes and increased physical activity. - Reassess blood pressure control in 3-4 months. - We discussed the importance of trying an additional medication if BP remains uncontrolled at follow up.   # Lower extremity edema and volume status assessment Swelling around ankles is trace at most.  It seems more like lipomatous distribution. Previous echocardiogram showed RA pressure 8. - Order BNP test to assess for volume overload and potential heart failure. -  Consider repeating echocardiogram if BNP is elevated.  # Obesity and weight management Current strategy includes dietary modifications. Considering resuming low-dose naltrexone (LDN) for weight loss and inflammation control with assistance from another provider. She declines GLP-1. - Reassess weight and blood pressure in 3-4 months.  # Thyroid  disorder Contributes to metabolic issues. Aware of need to manage thyroid  levels.  # PAF:  S/p ablation.  Her anticoagulation was previously discontinued.  Continue carvedilol .    Screening for Secondary Hypertension:     05/17/2023   11:42 AM  Causes  Drugs/Herbals Screened     - Comments tries to limit salt.  humic acid supplement.  no EtOH/tobacco.    Relevant Labs/Studies:    Latest Ref Rng & Units 07/14/2023    8:45 AM 03/30/2023    7:56 AM 11/16/2022    7:43 AM  Basic Labs  Sodium 135 - 145 mEq/L 138  138  139   Potassium 3.5 - 5.1 mEq/L 4.2  4.1  3.9   Creatinine 0.40 - 1.20 mg/dL 9.36  9.28  9.35        Latest Ref Rng & Units 07/14/2023    8:45 AM 03/30/2023    7:56 AM  Thyroid    TSH 0.35 - 5.50 uIU/mL 5.70  5.78     Disposition:    FU with MD/PharmD in 3 months    Medication Adjustments/Labs and Tests Ordered: Current medicines are reviewed at length with the patient today.  Concerns regarding medicines are outlined above.  No orders of the defined types were placed in this encounter.  No orders of the defined types were placed in this encounter.    Signed, Annabella Scarce, MD  11/13/2023 11:07 AM    [facility]

## 2023-11-14 ENCOUNTER — Ambulatory Visit: Admitting: Internal Medicine

## 2023-11-14 VITALS — BP 138/78 | HR 70 | Resp 16 | Ht 66.0 in | Wt 263.2 lb

## 2023-11-14 DIAGNOSIS — I10 Essential (primary) hypertension: Secondary | ICD-10-CM

## 2023-11-14 DIAGNOSIS — E1165 Type 2 diabetes mellitus with hyperglycemia: Secondary | ICD-10-CM | POA: Diagnosis not present

## 2023-11-14 DIAGNOSIS — E559 Vitamin D deficiency, unspecified: Secondary | ICD-10-CM

## 2023-11-14 DIAGNOSIS — E782 Mixed hyperlipidemia: Secondary | ICD-10-CM | POA: Diagnosis not present

## 2023-11-14 DIAGNOSIS — I480 Paroxysmal atrial fibrillation: Secondary | ICD-10-CM

## 2023-11-14 DIAGNOSIS — R7989 Other specified abnormal findings of blood chemistry: Secondary | ICD-10-CM

## 2023-11-14 DIAGNOSIS — Z0000 Encounter for general adult medical examination without abnormal findings: Secondary | ICD-10-CM | POA: Diagnosis not present

## 2023-11-14 DIAGNOSIS — E538 Deficiency of other specified B group vitamins: Secondary | ICD-10-CM

## 2023-11-14 DIAGNOSIS — R945 Abnormal results of liver function studies: Secondary | ICD-10-CM

## 2023-11-14 DIAGNOSIS — G4733 Obstructive sleep apnea (adult) (pediatric): Secondary | ICD-10-CM

## 2023-11-14 DIAGNOSIS — R197 Diarrhea, unspecified: Secondary | ICD-10-CM

## 2023-11-14 DIAGNOSIS — K760 Fatty (change of) liver, not elsewhere classified: Secondary | ICD-10-CM

## 2023-11-14 LAB — BASIC METABOLIC PANEL WITH GFR
BUN/Creatinine Ratio: 18 (ref 9–23)
BUN: 12 mg/dL (ref 6–24)
CO2: 20 mmol/L (ref 20–29)
Calcium: 10.4 mg/dL — ABNORMAL HIGH (ref 8.7–10.2)
Chloride: 103 mmol/L (ref 96–106)
Creatinine, Ser: 0.67 mg/dL (ref 0.57–1.00)
Glucose: 111 mg/dL — ABNORMAL HIGH (ref 70–99)
Potassium: 4.8 mmol/L (ref 3.5–5.2)
Sodium: 141 mmol/L (ref 134–144)
eGFR: 103 mL/min/1.73 (ref >59)

## 2023-11-14 NOTE — Assessment & Plan Note (Signed)
 Physical today 11/14/23.SABRA  PAP 05/10/21 - negative with negative HPV. Mammogram 06/06/23 - Birads I.  Colonoscopy 12/31/20.

## 2023-11-14 NOTE — Progress Notes (Signed)
 Subjective:    Patient ID: Joy Houston, female    DOB: [birth year], 58 y.o.   MRN: [id number]  Patient here for  Chief Complaint  Patient presents with   Annual Exam    HPI Here for her physical exam. Saw Dr Raford f/u blood pressure. No changes made. Is concerned regarding her weight gain. Discussed weight gain. Discussed diet and exercise. She is not exercising. Breathing overall appears to be stable. No increased cough or congestion. Appears to be handling stress. Blood pressure remains elevated.    Past Medical History:  Diagnosis Date   Anemia    Angio-edema    Atrial fibrillation (HCC)    Complication of anesthesia    Diabetes mellitus without complication (HCC)    diet controlled   Dysplastic nevus 06/27/2018   Left upper back. Moderate atypia, limited margins free.    Eczema    Epstein Barr infection    Family history of anesthesia complication    Mother - confusion   Fatty liver    Heart murmur    Slight - nothing to worry about   Hemorrhoid    Hypertension    Hypothyroidism    PONV (postoperative nausea and vomiting)    only with c-sections   Sleep apnea    mild   Past Surgical History:  Procedure Laterality Date   CESAREAN SECTION     CHOLECYSTECTOMY N/A 09/07/2018   Procedure: LAPAROSCOPIC CHOLECYSTECTOMY;  Surgeon: Rodolph Romano, MD;  Location: [facility] ORS;  Service: General;  Laterality: N/A;   FINGER SURGERY     pin to 3rd finger Right hand   LUMBAR LAMINECTOMY/DECOMPRESSION MICRODISCECTOMY  12/15/2011   Procedure: LUMBAR LAMINECTOMY/DECOMPRESSION MICRODISCECTOMY 1 LEVEL;  Surgeon: Reyes JONETTA Budge, MD;  Location: [facility];  Service: Neurosurgery;  Laterality: Right;  RIGHT Lumbar four-Five diskectomy   LUMBAR LAMINECTOMY/DECOMPRESSION MICRODISCECTOMY N/A 11/04/2019   Procedure: MICRODISCECTOMY LEFT LUMBAR FIVE SACRAL ONE;  Surgeon: Budge Reyes, MD;  Location: [facility];  Service: Neurosurgery;  Laterality: N/A;  3C    MICRODISCECTOMY LUMBAR     L4-5   WISDOM TOOTH EXTRACTION     Family History  Problem Relation Age of Onset   Hypertension Mother    GER disease Mother    Heart failure Father    Hypertension Father    Diabetes Father    Cancer Father        Melanoma, Prostate   Heart disease Father    Breast cancer Sister 38   Stroke Maternal Grandmother    Heart attack Maternal Grandfather    Heart disease Maternal Grandfather    Diabetes Paternal Grandfather    Social History   Socioeconomic History   Marital status: Married    Spouse name: Not on file   Number of children: 2   Years of education: Not on file   Highest education level: Bachelor's degree (e.g., BA, AB, BS)  Occupational History    Employer: [employer]  Tobacco Use   Smoking status: Never   Smokeless tobacco: Never  Vaping Use   Vaping status: Never Used  Substance and Sexual Activity   Alcohol use: Not Currently    Alcohol/week: 0.0 standard drinks of alcohol   Drug use: No   Sexual activity: Yes    Partners: Male    Comment: married  Other Topics Concern   Not on file  Social History Narrative   Nurse    Married   Social Drivers of Health   Financial Resource Strain:  Low Risk  (04/24/2023)   Overall Financial Resource Strain (CARDIA)    Difficulty of Paying Living Expenses: Not hard at all  Food Insecurity: No Food Insecurity (04/24/2023)   Hunger Vital Sign    Worried About Running Out of Food in the Last Year: Never true    Ran Out of Food in the Last Year: Never true  Transportation Needs: No Transportation Needs (04/24/2023)   PRAPARE - Administrator, Civil Service (Medical): No    Lack of Transportation (Non-Medical): No  Physical Activity: Unknown (04/24/2023)   Exercise Vital Sign    Days of Exercise per Week: Patient declined    Minutes of Exercise per Session: Not on file  Stress: Patient Declined (04/24/2023)   [employer] of Occupational Health - Occupational Stress  Questionnaire    Feeling of Stress : Patient declined  Social Connections: Socially Integrated (04/24/2023)   Social Connection and Isolation Panel    Frequency of Communication with Friends and Family: More than three times a week    Frequency of Social Gatherings with Friends and Family: More than three times a week    Attends Religious Services: More than 4 times per year    Active Member of [employer] or Organizations: Yes    Attends Engineer, structural: More than 4 times per year    Marital Status: Married     Review of Systems  Constitutional:  Negative for appetite change and unexpected weight change.  HENT:  Negative for congestion, sinus pressure and sore throat.   Eyes:  Negative for pain and visual disturbance.  Respiratory:  Negative for cough, chest tightness and shortness of breath.   Cardiovascular:  Negative for chest pain, palpitations and leg swelling.  Gastrointestinal:  Negative for abdominal pain, diarrhea, nausea and vomiting.  Genitourinary:  Negative for difficulty urinating and dysuria.  Musculoskeletal:  Negative for myalgias.  Skin:  Negative for color change and rash.  Neurological:  Negative for dizziness and headaches.  Hematological:  Negative for adenopathy. Does not bruise/bleed easily.  Psychiatric/Behavioral:  Negative for agitation and dysphoric mood.        Objective:     BP 138/78   Pulse 70   Resp 16   Ht 5' 6 (1.676 m)   Wt 263 lb 3.2 oz (119.4 kg)   SpO2 98%   BMI 42.48 kg/m  Wt Readings from Last 3 Encounters:  11/14/23 263 lb 3.2 oz (119.4 kg)  11/13/23 260 lb (117.9 kg)  07/25/23 263 lb (119.3 kg)    Physical Exam Vitals reviewed.  Constitutional:      General: She is not in acute distress.    Appearance: Normal appearance. She is well-developed.  HENT:     Head: Normocephalic and atraumatic.     Right Ear: External ear normal.     Left Ear: External ear normal.     Mouth/Throat:     Pharynx: No oropharyngeal  exudate or posterior oropharyngeal erythema.  Eyes:     General: No scleral icterus.       Right eye: No discharge.        Left eye: No discharge.     Conjunctiva/sclera: Conjunctivae normal.  Neck:     Thyroid : No thyromegaly.  Cardiovascular:     Rate and Rhythm: Normal rate and regular rhythm.  Pulmonary:     Effort: No tachypnea, accessory muscle usage or respiratory distress.     Breath sounds: Normal breath sounds. No decreased breath sounds  or wheezing.  Chest:  Breasts:    Right: No inverted nipple, mass, nipple discharge or tenderness (no axillary adenopathy).     Left: No inverted nipple, mass, nipple discharge or tenderness (no axilarry adenopathy).  Abdominal:     General: Bowel sounds are normal.     Palpations: Abdomen is soft.     Tenderness: There is no abdominal tenderness.  Musculoskeletal:        General: No swelling or tenderness.     Cervical back: Neck supple.  Lymphadenopathy:     Cervical: No cervical adenopathy.  Skin:    Findings: No erythema or rash.  Neurological:     Mental Status: She is alert and oriented to person, place, and time.  Psychiatric:        Mood and Affect: Mood normal.        Behavior: Behavior normal.         Outpatient Encounter Medications as of 11/14/2023  Medication Sig   carvedilol  (COREG ) 12.5 MG tablet Take 1 tablet (12.5 mg total) by mouth 2 (two) times daily with a meal.   EPINEPHrine  (EPIPEN  2-PAK) 0.3 mg/0.3 mL IJ SOAJ injection Inject 0.3 mg into the muscle as needed for anaphylaxis.   hydrALAZINE  (APRESOLINE ) 10 MG tablet TAKE 1 TABLET (10 MG TOTAL) BY MOUTH 3 (THREE) TIMES DAILY. TO TAKE WITH THE 25 MG TABLETS   hydrALAZINE  (APRESOLINE ) 25 MG tablet TAKE 1 TABLET (25 MG TOTAL) BY MOUTH 3 (THREE) TIMES DAILY. TO TAKE WITH THE 10 MG TABLETS   hydrocortisone  (ANUSOL -HC) 25 MG suppository PLACE 1 SUPPOSITORY RECTALLY 2 (TWO) TIMES DAILY AS NEEDED FOR HEMORRHOIDS OR ANAL ITCHING.   hydrocortisone  2.5 % cream Apply  twice a day for up to 2 weeks as needed for rash.   ketoconazole  (NIZORAL ) 2 % cream Apply twice a day as needed for rash.   Magnesium Bisglycinate Dihyd POWD Take 400 mg by mouth See admin instructions. Pt takes 2-4 times per week   VITAMIN D  PO Take by mouth.   No facility-administered encounter medications on file as of 11/14/2023.     Lab Results  Component Value Date   WBC 7.5 03/30/2023   HGB 14.5 03/30/2023   HCT 42.8 03/30/2023   PLT 265.0 03/30/2023   GLUCOSE 111 (H) 11/13/2023   CHOL 205 (H) 07/14/2023   TRIG 112.0 07/14/2023   HDL 61.60 07/14/2023   LDLDIRECT 145.4 03/01/2013   LDLCALC 121 (H) 07/14/2023   ALT 40 (H) 07/14/2023   AST 26 07/14/2023   NA 141 11/13/2023   K 4.8 11/13/2023   CL 103 11/13/2023   CREATININE 0.67 11/13/2023   BUN 12 11/13/2023   CO2 20 11/13/2023   TSH 5.70 (H) 07/14/2023   HGBA1C 6.3 07/14/2023    MM 3D SCREENING MAMMOGRAM BILATERAL BREAST Result Date: 06/08/2023 CLINICAL DATA:  Screening. EXAM: DIGITAL SCREENING BILATERAL MAMMOGRAM WITH TOMOSYNTHESIS AND CAD TECHNIQUE: Bilateral screening digital craniocaudal and mediolateral oblique mammograms were obtained. Bilateral screening digital breast tomosynthesis was performed. The images were evaluated with computer-aided detection. COMPARISON:  Previous exam(s). ACR Breast Density Category b: There are scattered areas of fibroglandular density. FINDINGS: There are no findings suspicious for malignancy. IMPRESSION: No mammographic evidence of malignancy. A result letter of this screening mammogram will be mailed directly to the patient. RECOMMENDATION: Screening mammogram in one year. (Code:SM-B-01Y) BI-RADS CATEGORY  1: Negative. Electronically Signed   By: Alm Parkins M.D.   On: 06/08/2023 15:09       Assessment &  Plan:  Fatty liver -     Hepatic function panel; Future  Hyperlipidemia, mixed Assessment & Plan: Low cholesterol diet and exercise. Follow lipid panel.   Orders: -      Lipid panel; Future -     T4, free; Future -     TSH; Future  Primary hypertension Assessment & Plan: Blood pressure remains elevated. Seeing Dr Raford. Taking hydralazine  tid. Tolerating.  Continues carvedilol . Follow pressures. Desires no additional medication at this time. Just saw Dr Raford.   Orders: -     Basic metabolic panel with GFR; Future  Type 2 diabetes mellitus with hyperglycemia, without long-term current use of insulin  (HCC) Assessment & Plan: Continue low carb diet and exercise.  Discussed diet and exercise today.  Follow met b and A1c.  Lab Results  Component Value Date   HGBA1C 6.3 07/14/2023    Orders: -     Hemoglobin A1c; Future -     Microalbumin / creatinine urine ratio; Future  Vitamin D  deficiency Assessment & Plan: Check vitamin D  level with next labs.   Orders: -     VITAMIN D  25 Hydroxy (Vit-D Deficiency, Fractures); Future  Health care maintenance Assessment & Plan: Physical today 11/14/23.SABRA  PAP 05/10/21 - negative with negative HPV. Mammogram 06/06/23 - Birads I.  Colonoscopy 12/31/20.    Abnormal liver function Assessment & Plan: Discussed diet and exercise. Follow liver function tests.    B12 deficiency Assessment & Plan: Continues on B12 supplements.   Orders: -     Vitamin B12; Future  Diarrhea, unspecified type Assessment & Plan: Previous stool studies negative.  She has had persistent problems with diarrhea. (Ongoing for years). Symptoms seem to have been more noticeable after cholecystectomy 08/2018.  Work up to date has included - TTgIGA negative.  Lipase wnl.  CT abd/pelvis - diverticulosis, otherwise negative. Colonoscopy negative for microscopic colitis.  Previous TSH/T4 negative.  No weight loss.  Eating.  No nausea or vomiting.   Have discussed history of loose stools.  Declines cholestyramine. Notify me if changes her mind about starting coloestid. Has seen Dr Trudy. 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  Treated with flagyl and biltricide. Stool issues continue. Notify me if changes mind regarding colestid.    Elevated TSH Assessment & Plan: Free T4 wnl. Slight elevated. Recheck thyroid  tests with next labs.    OSA (obstructive sleep apnea) Assessment & Plan: Has oral device.    Paroxysmal A-fib [facility]) Assessment & Plan: S/p ablation.  Off xarelto.  Afib/symptoms improved overall.  Follow pressures. Followed by cardiology. Appears to be in SR today.       Allena Hamilton, MD

## 2023-11-19 ENCOUNTER — Encounter: Payer: Self-pay | Admitting: Internal Medicine

## 2023-11-19 NOTE — Assessment & Plan Note (Signed)
 Free T4 wnl. Slight elevated. Recheck thyroid  tests with next labs.

## 2023-11-19 NOTE — Assessment & Plan Note (Signed)
Has oral device.

## 2023-11-19 NOTE — Assessment & Plan Note (Signed)
Discussed diet and exercise. Follow liver function tests.   

## 2023-11-19 NOTE — Assessment & Plan Note (Signed)
 S/p ablation.  Off xarelto.  Afib/symptoms improved overall.  Follow pressures. Followed by cardiology. Appears to be in SR today.

## 2023-11-19 NOTE — Assessment & Plan Note (Signed)
 Continues on B12 supplements.

## 2023-11-19 NOTE — Assessment & Plan Note (Signed)
 Blood pressure remains elevated. Seeing Dr Raford. Taking hydralazine  tid. Tolerating.  Continues carvedilol . Follow pressures. Desires no additional medication at this time. Just saw Dr Raford.

## 2023-11-19 NOTE — Assessment & Plan Note (Signed)
 Check vitamin D level with next labs.  ?

## 2023-11-19 NOTE — Assessment & Plan Note (Signed)
 Continue low carb diet and exercise.  Discussed diet and exercise today.  Follow met b and A1c.  Lab Results  Component Value Date   HGBA1C 6.3 07/14/2023

## 2023-11-19 NOTE — Assessment & Plan Note (Signed)
 Previous stool studies negative.  She has had persistent problems with diarrhea. (Ongoing for years). Symptoms seem to have been more noticeable after cholecystectomy 08/2018.  Work up to date has included - TTgIGA negative.  Lipase wnl.  CT abd/pelvis - diverticulosis, otherwise negative. Colonoscopy negative for microscopic colitis.  Previous TSH/T4 negative.  No weight loss.  Eating.  No nausea or vomiting.   Have discussed history of loose stools.  Declines cholestyramine. Notify me if changes her mind about starting coloestid. Has seen Dr Trudy. 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 Treated with flagyl and biltricide. Stool issues continue. Notify me if changes mind regarding colestid.

## 2023-11-19 NOTE — Assessment & Plan Note (Signed)
 Low cholesterol diet and exercise.  Follow lipid panel.

## 2023-11-22 DIAGNOSIS — E119 Type 2 diabetes mellitus without complications: Secondary | ICD-10-CM | POA: Diagnosis not present

## 2023-11-23 ENCOUNTER — Ambulatory Visit: Payer: Self-pay | Admitting: Cardiovascular Disease

## 2023-12-12 ENCOUNTER — Other Ambulatory Visit (INDEPENDENT_AMBULATORY_CARE_PROVIDER_SITE_OTHER)

## 2023-12-12 DIAGNOSIS — E1165 Type 2 diabetes mellitus with hyperglycemia: Secondary | ICD-10-CM

## 2023-12-12 DIAGNOSIS — E538 Deficiency of other specified B group vitamins: Secondary | ICD-10-CM

## 2023-12-12 DIAGNOSIS — E782 Mixed hyperlipidemia: Secondary | ICD-10-CM | POA: Diagnosis not present

## 2023-12-12 DIAGNOSIS — I10 Essential (primary) hypertension: Secondary | ICD-10-CM

## 2023-12-12 DIAGNOSIS — E559 Vitamin D deficiency, unspecified: Secondary | ICD-10-CM

## 2023-12-12 DIAGNOSIS — K760 Fatty (change of) liver, not elsewhere classified: Secondary | ICD-10-CM

## 2023-12-12 LAB — BASIC METABOLIC PANEL WITH GFR
BUN: 14 mg/dL (ref 6–23)
CO2: 25 meq/L (ref 19–32)
Calcium: 9.1 mg/dL (ref 8.4–10.5)
Chloride: 103 meq/L (ref 96–112)
Creatinine, Ser: 0.62 mg/dL (ref 0.40–1.20)
GFR: 99.61 mL/min (ref >60.00)
Glucose, Bld: 151 mg/dL — ABNORMAL HIGH (ref 70–99)
Potassium: 4 meq/L (ref 3.5–5.1)
Sodium: 138 meq/L (ref 135–145)

## 2023-12-12 LAB — VITAMIN B12: Vitamin B-12: 915 pg/mL — ABNORMAL HIGH (ref 211–911)

## 2023-12-12 LAB — HEPATIC FUNCTION PANEL
ALT: 56 U/L — ABNORMAL HIGH (ref 0–35)
AST: 35 U/L (ref 0–37)
Albumin: 4.2 g/dL (ref 3.5–5.2)
Alkaline Phosphatase: 73 U/L (ref 39–117)
Bilirubin, Direct: 0.1 mg/dL (ref 0.0–0.3)
Total Bilirubin: 0.5 mg/dL (ref 0.2–1.2)
Total Protein: 7.2 g/dL (ref 6.0–8.3)

## 2023-12-12 LAB — MICROALBUMIN / CREATININE URINE RATIO
Creatinine,U: 163.5 mg/dL
Microalb Creat Ratio: 14.5 mg/g (ref 0.0–30.0)
Microalb, Ur: 2.4 mg/dL — ABNORMAL HIGH (ref 0.0–1.9)

## 2023-12-12 LAB — HEMOGLOBIN A1C: Hgb A1c MFr Bld: 6.5 % (ref 4.6–6.5)

## 2023-12-12 LAB — LIPID PANEL
Cholesterol: 190 mg/dL (ref 0–200)
HDL: 56.5 mg/dL (ref >39.00)
LDL Cholesterol: 109 mg/dL — ABNORMAL HIGH (ref 0–99)
NonHDL: 133.57
Total CHOL/HDL Ratio: 3
Triglycerides: 121 mg/dL (ref 0.0–149.0)
VLDL: 24.2 mg/dL (ref 0.0–40.0)

## 2023-12-12 LAB — VITAMIN D 25 HYDROXY (VIT D DEFICIENCY, FRACTURES): VITD: 24.7 ng/mL — ABNORMAL LOW (ref 30.00–100.00)

## 2023-12-12 LAB — T4, FREE: Free T4: 0.75 ng/dL (ref 0.60–1.60)

## 2023-12-12 LAB — TSH: TSH: 4.23 u[IU]/mL (ref 0.35–5.50)

## 2023-12-13 ENCOUNTER — Ambulatory Visit: Payer: Self-pay | Admitting: Internal Medicine

## 2023-12-23 DIAGNOSIS — E119 Type 2 diabetes mellitus without complications: Secondary | ICD-10-CM | POA: Diagnosis not present

## 2024-02-05 ENCOUNTER — Ambulatory Visit: Payer: BC Managed Care – PPO | Admitting: Dermatology

## 2024-02-05 DIAGNOSIS — B353 Tinea pedis: Secondary | ICD-10-CM

## 2024-02-05 DIAGNOSIS — L309 Dermatitis, unspecified: Secondary | ICD-10-CM

## 2024-02-05 DIAGNOSIS — D1801 Hemangioma of skin and subcutaneous tissue: Secondary | ICD-10-CM

## 2024-02-05 DIAGNOSIS — L814 Other melanin hyperpigmentation: Secondary | ICD-10-CM

## 2024-02-05 DIAGNOSIS — L304 Erythema intertrigo: Secondary | ICD-10-CM

## 2024-02-05 DIAGNOSIS — Z1283 Encounter for screening for malignant neoplasm of skin: Secondary | ICD-10-CM

## 2024-02-05 DIAGNOSIS — Z86018 Personal history of other benign neoplasm: Secondary | ICD-10-CM

## 2024-02-05 DIAGNOSIS — W908XXA Exposure to other nonionizing radiation, initial encounter: Secondary | ICD-10-CM | POA: Diagnosis not present

## 2024-02-05 DIAGNOSIS — L853 Xerosis cutis: Secondary | ICD-10-CM

## 2024-02-05 DIAGNOSIS — D229 Melanocytic nevi, unspecified: Secondary | ICD-10-CM

## 2024-02-05 DIAGNOSIS — L821 Other seborrheic keratosis: Secondary | ICD-10-CM

## 2024-02-05 DIAGNOSIS — L308 Other specified dermatitis: Secondary | ICD-10-CM | POA: Diagnosis not present

## 2024-02-05 DIAGNOSIS — L578 Other skin changes due to chronic exposure to nonionizing radiation: Secondary | ICD-10-CM

## 2024-02-05 DIAGNOSIS — L570 Actinic keratosis: Secondary | ICD-10-CM

## 2024-02-05 DIAGNOSIS — Z808 Family history of malignant neoplasm of other organs or systems: Secondary | ICD-10-CM

## 2024-02-05 MED ORDER — HYDROCORTISONE 2.5 % EX CREA: TOPICAL_CREAM | CUTANEOUS | 11 refills | Status: DC

## 2024-02-05 MED ORDER — KETOCONAZOLE 2 % EX CREA: TOPICAL_CREAM | CUTANEOUS | 11 refills | Status: DC

## 2024-02-05 MED ORDER — ANZUPGO 20 MG/GM EX CREA: 1.0000 g | TOPICAL_CREAM | Freq: Two times a day (BID) | CUTANEOUS | 11 refills | Status: DC

## 2024-02-05 NOTE — Patient Instructions (Addendum)
 Your prescription was sent to [facility] in [place]. A representative from [facility] will contact you within 2 business hours to verify your address and insurance information to schedule a free delivery. If for any reason you do not receive a phone call from them, please reach out to them. Their phone number is [phone] and their hours are Monday-Friday 9:00 am-5:00 pm.     Cryotherapy Aftercare  Wash gently with soap and water everyday.   Apply Vaseline and Band-Aid daily until healed.   Dry Skin Care  What causes dry skin?  Dry skin is common and results from inadequate moisture in the outer skin layers. Dry skin usually results from the excessive loss of moisture from the skin surface. This occurs due to two major factors: Normally the skin's oil glands deposit a layer of oil on the skin's surface. This layer of oil prevents the loss of moisture from the skin. Exposure to soaps, cleaners, solvents, and disinfectants removes this oily film, allowing water to escape. Water loss from the skin increases when the humidity is low. During winter months we spend a lot of time indoors where the air is heated. Heated air has very low humidity. This also contributes to dry skin.  A tendency for dry skin may accompany such disorders as eczema. Also, as people age, the number of functioning oil glands decreases, and the tendency toward dry skin can be a sensation of skin tightness when emerging from the shower.  How do I manage dry skin?  Humidify your environment. This can be accomplished by using a humidifier in your bedroom at night during winter months. Bathing can actually put moisture back into your skin if done right. Take the following steps while bathing to sooth dry skin: Avoid hot water, which only dries the skin and makes itching worse. Use warm water. Avoid washcloths or extensive rubbing or scrubbing. Use mild soaps like unscented Dove, Oil of Olay, Cetaphil, Basis, or  CeraVe. If you take baths rather than showers, rinse off soap residue with clean water before getting out of tub. Once out of the shower/tub, pat dry gently with a soft towel. Leave your skin damp. While still damp, apply any medicated ointment/cream you were prescribed to the affected areas. After you apply your medicated ointment/cream, then apply your moisturizer to your whole body.This is the most important step in dry skin care. If this is omitted, your skin will continue to be dry. The choice of moisturizer is also very important. In general, lotion will not provider enough moisture to severely dry skin because it is water based. You should use an ointment or cream. Moisturizers should also be unscented. Good choices include Vaseline (plain petrolatum), Aquaphor, Cetaphil, CeraVe, Vanicream, DML Forte, Aveeno moisture, or Eucerin Cream. Bath oils can be helpful, but do not replace the application of moisturizer after the bath. In addition, they make the tub slippery causing an increased risk for falls. Therefore, we do not recommend their use.  Due to recent changes in healthcare laws, you may see results of your pathology and/or laboratory studies on MyChart before the doctors have had a chance to review them. We understand that in some cases there may be results that are confusing or concerning to you. Please understand that not all results are received at the same time and often the doctors may need to interpret multiple results in order to provide you with the best plan of care or course of treatment. Therefore, we ask that you  please give us  2 business days to thoroughly review all your results before contacting the office for clarification. Should we see a critical lab result, you will be contacted sooner.   If You Need Anything After Your Visit  If you have any questions or concerns for your doctor, please call our main line at [phone] and press option 4 to reach your doctor's medical  assistant. If no one answers, please leave a voicemail as directed and we will return your call as soon as possible. Messages left after 4 pm will be answered the following business day.   You may also send us  a message via MyChart. We typically respond to MyChart messages within 1-2 business days.  For prescription refills, please ask your pharmacy to contact our office. Our fax number is [phone].  If you have an urgent issue when the clinic is closed that cannot wait until the next business day, you can page your doctor at the number below.    Please note that while we do our best to be available for urgent issues outside of office hours, we are not available 24/7.   If you have an urgent issue and are unable to reach us , you may choose to seek medical care at your doctor's office, retail clinic, urgent care center, or emergency room.  If you have a medical emergency, please immediately call 911 or go to the emergency department.  Pager Numbers  - Dr. Hester: [phone]  - Dr. Jackquline: [phone]  - Dr. Claudene: [phone]   - Dr. Raymund: [phone]  In the event of inclement weather, please call our main line at [phone] for an update on the status of any delays or closures.  Dermatology Medication Tips: Please keep the boxes that topical medications come in in order to help keep track of the instructions about where and how to use these. Pharmacies typically print the medication instructions only on the boxes and not directly on the medication tubes.   If your medication is too expensive, please contact our office at [phone] option 4 or send us  a message through MyChart.   We are unable to tell what your co-pay for medications will be in advance as this is different depending on your insurance coverage. However, we may be able to find a substitute medication at lower cost or fill out paperwork to get insurance to cover a needed medication.   If a prior  authorization is required to get your medication covered by your insurance company, please allow us  1-2 business days to complete this process.  Drug prices often vary depending on where the prescription is filled and some pharmacies may offer cheaper prices.  The website www.goodrx.com contains coupons for medications through different pharmacies. The prices here do not account for what the cost may be with help from insurance (it may be cheaper with your insurance), but the website can give you the price if you did not use any insurance.  - You can print the associated coupon and take it with your prescription to the pharmacy.  - You may also stop by our office during regular business hours and pick up a GoodRx coupon card.  - If you need your prescription sent electronically to a different pharmacy, notify our office through [facility] or by phone at [phone] option 4.     Si Usted Necesita Algo Despus de Su Visita  Tambin puede enviarnos un mensaje a travs de Clinical Cytogeneticist. Por lo general respondemos a los mensajes  de MyChart en el transcurso de 1 a 2 das hbiles.  Para renovar recetas, por favor pida a su farmacia que se ponga en contacto con nuestra oficina. Randi lakes de fax es [place] [phone].  Si tiene un asunto urgente cuando la clnica est cerrada y que no puede esperar hasta el siguiente da hbil, puede llamar/localizar a su doctor(a) al nmero que aparece a continuacin.   Por favor, tenga en cuenta que aunque hacemos todo lo posible para estar disponibles para asuntos urgentes fuera del horario de [place], no estamos disponibles las 24 horas del da, los 7 [address]  [phone] de la [place].   Si tiene un problema urgente y no puede comunicarse con nosotros, puede optar por buscar atencin mdica  en el consultorio de su doctor(a), en una clnica privada, en un centro de atencin urgente o en una sala de emergencias.  Si tiene engineer, drilling, por favor llame  inmediatamente al 911 o vaya a la sala de emergencias.  Nmeros de bper  - Dr. Hester: [phone]  - Dra. Jackquline: [phone]  - Dr. Claudene: [phone]  - Dra. Kitts: [phone]  En caso de inclemencias del [place], por favor llame a nuestra lnea principal al [phone] para una actualizacin sobre el estado de cualquier retraso o cierre.  Consejos para la medicacin en dermatologa: Por favor, guarde las cajas en las que vienen los medicamentos de uso tpico para ayudarle a seguir las instrucciones sobre dnde y cmo usarlos. Las farmacias generalmente imprimen las instrucciones del medicamento slo en las cajas y no directamente en los tubos del [place].   Si su medicamento es muy caro, por favor, pngase en contacto con landry rieger llamando al [phone] y presione la opcin 4 o envenos un mensaje a travs de Clinical Cytogeneticist.   No podemos decirle cul ser su copago por los medicamentos por adelantado ya que esto es diferente dependiendo de la cobertura de su seguro. Sin embargo, es posible que podamos encontrar un medicamento sustituto a audiological scientist un formulario para que el seguro cubra el medicamento que se considera necesario.   Si se requiere una autorizacin previa para que su compaa de seguros cubra su medicamento, por favor permtanos de 1 a 2 das hbiles para completar este proceso.  Los precios de los medicamentos varan con frecuencia dependiendo del environmental consultant de dnde se surte la receta y alguna farmacias pueden ofrecer precios ms baratos.  El sitio web www.goodrx.com tiene cupones para medicamentos de health and safety inspector. Los precios aqu no tienen en cuenta lo que podra costar con la ayuda del seguro (puede ser ms barato con su seguro), pero el sitio web puede darle el precio si no utiliz tourist information centre manager.  - Puede imprimir el cupn correspondiente y llevarlo con su receta a la farmacia.  - Tambin puede pasar por nuestra oficina durante el horario  de atencin regular y education officer, museum una tarjeta de cupones de GoodRx.  - Si necesita que su receta se enve electrnicamente a una farmacia diferente, informe a nuestra oficina a travs de MyChart de [place] o por telfono llamando al [phone] y presione la opcin 4.

## 2024-02-05 NOTE — Progress Notes (Unsigned)
 Follow-Up Visit   Subjective  Joy Houston is a 58 y.o. female who presents for the following: Skin Cancer Screening and Full Body Skin Exam  The patient presents for Total-Body Skin Exam (TBSE) for skin cancer screening and mole check. The patient has spots, moles and lesions to be evaluated, some may be new or changing. History of dysplastic nevus.  Needs rfs meds for intertrigo   The following portions of the chart were reviewed this encounter and updated as appropriate: medications, allergies, medical history  Review of Systems:  No other skin or systemic complaints except as noted in HPI or Assessment and Plan.  Objective  Well appearing patient in no apparent distress; mood and affect are within normal limits.  A full examination was performed including scalp, head, eyes, ears, nose, lips, neck, chest, axillae, abdomen, back, buttocks, bilateral upper extremities, bilateral lower extremities, hands, feet, fingers, toes, fingernails, and toenails. All findings within normal limits unless otherwise noted below.   Relevant physical exam findings are noted in the Assessment and Plan.  Left Ear Helix Pink tan scaly macule, slightly waxy.   Assessment & Plan   SKIN CANCER SCREENING PERFORMED TODAY.  FAMILY HISTORY OF SKIN CANCER What type(s): Melanoma Who affected: Father    HISTORY OF DYSPLASTIC NEVUS. Left upper back. Moderate atypia, limited margins free. 06/27/2018. No evidence of recurrence today Recommend regular full body skin exams Recommend daily broad spectrum sunscreen SPF 30+ to sun-exposed areas, reapply every 2 hours as needed.  Call if any new or changing lesions are noted between office visits   ACTINIC DAMAGE - Chronic condition, secondary to cumulative UV/sun exposure - diffuse scaly erythematous macules with underlying dyspigmentation - Recommend daily broad spectrum sunscreen SPF 30+ to sun-exposed areas, reapply every 2 hours as needed.  - Staying  in the shade or wearing long sleeves, sun glasses (UVA+UVB protection) and wide brim hats (4-inch brim around the entire circumference of the hat) are also recommended for sun protection.  - Call for new or changing lesions.  LENTIGINES, SEBORRHEIC KERATOSES, HEMANGIOMAS - Benign normal skin lesions - Benign-appearing - Call for any changes  MELANOCYTIC NEVI - Tan-brown and/or pink-flesh-colored symmetric macules and papules - Benign appearing on exam today - Observation - Call clinic for new or changing moles - Recommend daily use of broad spectrum spf 30+ sunscreen to sun-exposed areas.   INTERTRIGO Exam Clear today   Chronic condition with duration or expected duration over one year. Currently well-controlled.    Intertrigo is a chronic recurrent rash that occurs in skin fold areas that may be associated with friction; heat; moisture; yeast; fungus; and bacteria.  It is exacerbated by increased movement / activity; sweating; and higher atmospheric temperature.   Treatment Plan Continue ketoconazole  cream twice a day as needed for rash.  Continue hydrocortisone  2.5% cream twice a day for up to 2 weeks as needed for rash.    Topical steroids (such as triamcinolone , fluocinolone, fluocinonide, mometasone, clobetasol, halobetasol, betamethasone, hydrocortisone ) can cause thinning and lightening of the skin if they are used for too long in the same area. Your physician has selected the right strength medicine for your problem and area affected on the body. Please use your medication only as directed by your physician to prevent side effects.    HAND DERMATITIS Exam Pink scaly patches on palms and MCPs  Chronic and persistent condition with duration or expected duration over one year. Condition is bothersome/symptomatic for patient. Currently flared.   Hand  Dermatitis is a chronic type of eczema that can come and go on the hands and fingers.  While there is no cure, the rash and  symptoms can be managed with topical prescription medications, and for more severe cases, with systemic medications.  Recommend mild soap and routine use of moisturizing cream after handwashing.  Minimize soap/water exposure when possible.    Treatment Plan Start Anzupgo  cream twice daily to hands as needed dsp 30 g 1 yr Rf. Sample given today. Rx sent to [facility].   Recommend mild soap and moisturizing cream with hand washing.   Xerosis - diffuse xerotic patches - recommend gentle, hydrating skin care - gentle skin care handout given  TINEA PEDIS Exam: Scaling at the left plantar foot.  Treatment Plan: Start ketoconazole  2% cream twice daily to both feet x 4 weeks. Recommend AmLactin Cream twice daily.  HAND DERMATITIS   This Visit - Delgocitinib  (ANZUPGO ) 20 MG/GM CREA - Apply 1 g topically in the morning and at bedtime. Apply 1 gram twice daily ERYTHEMA INTERTRIGO   This Visit - hydrocortisone  2.5 % cream - Apply twice a day for up to 2 weeks as needed for rash. - ketoconazole  (NIZORAL ) 2 % cream - Apply twice a day as needed for rash. AK (ACTINIC KERATOSIS) Left Ear Helix vs ISK.  Actinic keratoses are precancerous spots that appear secondary to cumulative UV radiation exposure/sun exposure over time. They are chronic with expected duration over 1 year. A portion of actinic keratoses will progress to squamous cell carcinoma of the skin. It is not possible to reliably predict which spots will progress to skin cancer and so treatment is recommended to prevent development of skin cancer.  Recommend daily broad spectrum sunscreen SPF 30+ to sun-exposed areas, reapply every 2 hours as needed.  Recommend staying in the shade or wearing long sleeves, sun glasses (UVA+UVB protection) and wide brim hats (4-inch brim around the entire circumference of the hat). Call for new or changing lesions. - Destruction of lesion - Left Ear Helix  Destruction method: cryotherapy   Informed  consent: discussed and consent obtained   Lesion destroyed using liquid nitrogen: Yes   Region frozen until ice ball extended beyond lesion: Yes   Outcome: patient tolerated procedure well with no complications   Post-procedure details: wound care instructions given   Additional details:  Prior to procedure, discussed risks of blister formation, small wound, skin dyspigmentation, or rare scar following cryotherapy. Recommend Vaseline ointment to treated areas while healing.   Return in about 1 year (around 02/04/2025) for TBSE, Hx Dysplastic Nevus.  IAndrea Kerns, CMA, am acting as scribe for Rexene Rattler, MD .   Documentation: I have reviewed the above documentation for accuracy and completeness, and I agree with the above.  Rexene Rattler, MD

## 2024-02-07 ENCOUNTER — Encounter (HOSPITAL_BASED_OUTPATIENT_CLINIC_OR_DEPARTMENT_OTHER): Admitting: Cardiovascular Disease

## 2024-02-08 ENCOUNTER — Other Ambulatory Visit (HOSPITAL_BASED_OUTPATIENT_CLINIC_OR_DEPARTMENT_OTHER): Payer: Self-pay | Admitting: Cardiovascular Disease

## 2024-02-13 ENCOUNTER — Encounter: Payer: Self-pay | Admitting: Internal Medicine

## 2024-02-13 ENCOUNTER — Ambulatory Visit: Admitting: Internal Medicine

## 2024-02-13 VITALS — BP 136/84 | HR 77 | Temp 98.2°F | Ht 66.0 in | Wt 261.0 lb

## 2024-02-13 DIAGNOSIS — E559 Vitamin D deficiency, unspecified: Secondary | ICD-10-CM | POA: Diagnosis not present

## 2024-02-13 DIAGNOSIS — I480 Paroxysmal atrial fibrillation: Secondary | ICD-10-CM | POA: Diagnosis not present

## 2024-02-13 DIAGNOSIS — E1165 Type 2 diabetes mellitus with hyperglycemia: Secondary | ICD-10-CM | POA: Diagnosis not present

## 2024-02-13 DIAGNOSIS — I10 Essential (primary) hypertension: Secondary | ICD-10-CM | POA: Diagnosis not present

## 2024-02-13 DIAGNOSIS — E782 Mixed hyperlipidemia: Secondary | ICD-10-CM | POA: Diagnosis not present

## 2024-02-13 DIAGNOSIS — R197 Diarrhea, unspecified: Secondary | ICD-10-CM | POA: Diagnosis not present

## 2024-02-13 DIAGNOSIS — F439 Reaction to severe stress, unspecified: Secondary | ICD-10-CM

## 2024-02-13 DIAGNOSIS — Z1231 Encounter for screening mammogram for malignant neoplasm of breast: Secondary | ICD-10-CM

## 2024-02-13 DIAGNOSIS — K760 Fatty (change of) liver, not elsewhere classified: Secondary | ICD-10-CM

## 2024-02-13 LAB — HEPATIC FUNCTION PANEL
ALT: 52 U/L — ABNORMAL HIGH (ref 3–35)
AST: 33 U/L (ref 5–37)
Albumin: 4.3 g/dL (ref 3.5–5.2)
Alkaline Phosphatase: 83 U/L (ref 39–117)
Bilirubin, Direct: 0.1 mg/dL (ref 0.1–0.3)
Total Bilirubin: 0.4 mg/dL (ref 0.2–1.2)
Total Protein: 7.3 g/dL (ref 6.0–8.3)

## 2024-02-13 LAB — HM DIABETES FOOT EXAM

## 2024-02-13 LAB — BASIC METABOLIC PANEL WITH GFR
BUN: 14 mg/dL (ref 6–23)
CO2: 26 meq/L (ref 19–32)
Calcium: 9.2 mg/dL (ref 8.4–10.5)
Chloride: 104 meq/L (ref 96–112)
Creatinine, Ser: 0.64 mg/dL (ref 0.40–1.20)
GFR: 98.73 mL/min
Glucose, Bld: 135 mg/dL — ABNORMAL HIGH (ref 70–99)
Potassium: 4.1 meq/L (ref 3.5–5.1)
Sodium: 139 meq/L (ref 135–145)

## 2024-02-13 LAB — VITAMIN D 25 HYDROXY (VIT D DEFICIENCY, FRACTURES): VITD: 32.09 ng/mL (ref 30.00–100.00)

## 2024-02-13 NOTE — Progress Notes (Signed)
 "  Subjective:    Patient ID: Joy Houston, female    DOB: [birth year], 58 y.o.   MRN: [id number]  Patient here for  Chief Complaint  Patient presents with   Medical Management of Chronic Issues    HPI Here for a scheduled follow up - follow up regarding hypertension. Is now seeing Dr Raford - cardiology - follow pressures. Has f/u appt scheduled in 04/2024. Taking her medication. Blood pressure is doing some better on today's checks. Discussed diet and exercise. She is back on metronidazole - being treated for parasite infection - through Dr Trudy. Persistent loose stool. Has had extensive w/up as outlined in previous notes. Reports taking magnesium glycinate for leg cramps. Also receiving naltrexone (prescribed by Dr Trudy) for inflammation. Feels better on the medication. Overall feels from a cardiac standpoint - relatively stable. Still will notice some afib episodes, bur overall improved. Continued urinary urgency. Had discussed PFPT. Need to confirm appt.    Past Medical History:  Diagnosis Date   Allergy    Anemia    Angio-edema    Arthritis    Atrial fibrillation (HCC)    Complication of anesthesia    Diabetes mellitus without complication (HCC)    diet controlled   Dysplastic nevus 06/27/2018   Left upper back. Moderate atypia, limited margins free.    Eczema    Epstein Barr infection    Family history of anesthesia complication    Mother - confusion   Fatty liver    Heart murmur    Slight - nothing to worry about   Hemorrhoid    Hypertension    Hypothyroidism    Neuromuscular disorder (HCC)    PONV (postoperative nausea and vomiting)    only with c-sections   Sleep apnea    mild   Past Surgical History:  Procedure Laterality Date   CESAREAN SECTION     CHOLECYSTECTOMY N/A 09/07/2018   Procedure: LAPAROSCOPIC CHOLECYSTECTOMY;  Surgeon: Rodolph Romano, MD;  Location: [facility] ORS;  Service: General;  Laterality: N/A;   FINGER SURGERY     pin to  3rd finger Right hand   LUMBAR LAMINECTOMY/DECOMPRESSION MICRODISCECTOMY  12/15/2011   Procedure: LUMBAR LAMINECTOMY/DECOMPRESSION MICRODISCECTOMY 1 LEVEL;  Surgeon: Reyes JONETTA Budge, MD;  Location: [facility];  Service: Neurosurgery;  Laterality: Right;  RIGHT Lumbar four-Five diskectomy   LUMBAR LAMINECTOMY/DECOMPRESSION MICRODISCECTOMY N/A 11/04/2019   Procedure: MICRODISCECTOMY LEFT LUMBAR FIVE SACRAL ONE;  Surgeon: Budge Reyes, MD;  Location: [facility] OR;  Service: Neurosurgery;  Laterality: N/A;  3C   MICRODISCECTOMY LUMBAR     L4-5   SPINE SURGERY     WISDOM TOOTH EXTRACTION     Family History  Problem Relation Age of Onset   Hypertension Mother    GER disease Mother    Hyperlipidemia Mother    Vision loss Mother    Heart failure Father    Hypertension Father    Diabetes Father    Cancer Father        Melanoma, Prostate   Heart disease Father    Early death Father    Breast cancer Sister 41   Arthritis Sister    Cancer Sister    Stroke Maternal Grandmother    Heart attack Maternal Grandfather    Heart disease Maternal Grandfather    Diabetes Paternal Grandfather    Social History   Socioeconomic History   Marital status: Married    Spouse name: Not on file   Number of children: 2  Years of education: Not on file   Highest education level: Bachelor's degree (e.g., BA, AB, BS)  Occupational History    Employer: [employer]  Tobacco Use   Smoking status: Never   Smokeless tobacco: Never  Vaping Use   Vaping status: Never Used  Substance and Sexual Activity   Alcohol use: Not Currently    Alcohol/week: 0.0 standard drinks of alcohol   Drug use: No   Sexual activity: Yes    Partners: Male    Comment: married  Other Topics Concern   Not on file  Social History Narrative   Nurse    Married   Social Drivers of Health   Tobacco Use: Low Risk (02/19/2024)   Patient History    Smoking Tobacco Use: Never    Smokeless Tobacco Use: Never    Passive  Exposure: Not on file  Financial Resource Strain: Low Risk (04/24/2023)   Overall Financial Resource Strain (CARDIA)    Difficulty of Paying Living Expenses: Not hard at all  Food Insecurity: No Food Insecurity (04/24/2023)   Hunger Vital Sign    Worried About Running Out of Food in the Last Year: Never true    Ran Out of Food in the Last Year: Never true  Transportation Needs: No Transportation Needs (04/24/2023)   PRAPARE - Administrator, Civil Service (Medical): No    Lack of Transportation (Non-Medical): No  Physical Activity: Unknown (04/24/2023)   Exercise Vital Sign    Days of Exercise per Week: Patient declined    Minutes of Exercise per Session: Not on file  Stress: Patient Declined (04/24/2023)   [employer] of Occupational Health - Occupational Stress Questionnaire    Feeling of Stress : Patient declined  Social Connections: Socially Integrated (04/24/2023)   Social Connection and Isolation Panel    Frequency of Communication with Friends and Family: More than three times a week    Frequency of Social Gatherings with Friends and Family: More than three times a week    Attends Religious Services: More than 4 times per year    Active Member of Clubs or Organizations: Yes    Attends Banker Meetings: More than 4 times per year    Marital Status: Married  Depression (PHQ2-9): Low Risk (11/14/2023)   Depression (PHQ2-9)    PHQ-2 Score: 0  Alcohol Screen: Not on file  Housing: Unknown (09/05/2023)   Received from [facility]   Epic    Unable to Pay for Housing in the Last Year: Not on file    Number of Times Moved in the Last Year: Not on file    At any time in the past 12 months, were you homeless or living in a shelter (including now)?: No  Utilities: Not on file  Health Literacy: Not on file     Review of Systems  Constitutional:  Negative for appetite change and unexpected weight change.  HENT:  Negative for congestion and sinus  pressure.   Respiratory:  Negative for cough and chest tightness.        Breathing overall stable.   Cardiovascular:  Negative for chest pain.       No increased swelling. Intermittent episodes of afib - per pt.   Gastrointestinal:  Negative for abdominal pain, diarrhea, nausea and vomiting.  Genitourinary:  Negative for difficulty urinating and dysuria.       Urinary urgency as outlined.   Musculoskeletal:  Negative for myalgias.  Skin:  Negative for  color change and rash.  Neurological:  Negative for dizziness and headaches.  Psychiatric/Behavioral:  Negative for agitation and dysphoric mood.        Objective:     BP 136/84   Pulse 77   Temp 98.2 F (36.8 C) (Oral)   Ht 5' 6 (1.676 m)   Wt 261 lb (118.4 kg)   SpO2 97%   BMI 42.13 kg/m  Wt Readings from Last 3 Encounters:  02/13/24 261 lb (118.4 kg)  11/14/23 263 lb 3.2 oz (119.4 kg)  11/13/23 260 lb (117.9 kg)    Physical Exam Vitals reviewed.  Constitutional:      General: She is not in acute distress.    Appearance: Normal appearance.  HENT:     Head: Normocephalic and atraumatic.     Right Ear: External ear normal.     Left Ear: External ear normal.     Mouth/Throat:     Pharynx: No oropharyngeal exudate or posterior oropharyngeal erythema.  Eyes:     General: No scleral icterus.       Right eye: No discharge.        Left eye: No discharge.     Conjunctiva/sclera: Conjunctivae normal.  Neck:     Thyroid : No thyromegaly.  Cardiovascular:     Rate and Rhythm: Normal rate and regular rhythm.  Pulmonary:     Effort: No respiratory distress.     Breath sounds: Normal breath sounds. No wheezing.  Abdominal:     General: Bowel sounds are normal.     Palpations: Abdomen is soft.     Tenderness: There is no abdominal tenderness.  Musculoskeletal:        General: No swelling or tenderness.     Cervical back: Neck supple. No tenderness.  Lymphadenopathy:     Cervical: No cervical adenopathy.  Skin:     Findings: No erythema or rash.  Neurological:     Mental Status: She is alert.  Psychiatric:        Mood and Affect: Mood normal.        Behavior: Behavior normal.         Outpatient Encounter Medications as of 02/13/2024  Medication Sig   EPINEPHrine  (EPIPEN  2-PAK) 0.3 mg/0.3 mL IJ SOAJ injection Inject 0.3 mg into the muscle as needed for anaphylaxis.   hydrALAZINE  (APRESOLINE ) 10 MG tablet TAKE 1 TABLET (10 MG TOTAL) BY MOUTH 3 (THREE) TIMES DAILY. TO TAKE WITH THE 25 MG TABLETS   hydrALAZINE  (APRESOLINE ) 25 MG tablet Take 1 tablet (25 mg total) by mouth 3 (three) times daily. To take with the 10 mg tablets   hydrocortisone  (ANUSOL -HC) 25 MG suppository PLACE 1 SUPPOSITORY RECTALLY 2 (TWO) TIMES DAILY AS NEEDED FOR HEMORRHOIDS OR ANAL ITCHING.   hydrocortisone  2.5 % cream Apply twice a day for up to 2 weeks as needed for rash.   ketoconazole  (NIZORAL ) 2 % cream Apply twice a day as needed for rash.   Magnesium Bisglycinate Dihyd POWD Take 400 mg by mouth See admin instructions. Pt takes 2-4 times per week   NALTREXONE HCL, PAIN, PO Take by mouth. (Patient taking differently: Take 0.5 mg by mouth.)   VITAMIN D  PO Take by mouth.   [DISCONTINUED] carvedilol  (COREG ) 12.5 MG tablet Take 1 tablet (12.5 mg total) by mouth 2 (two) times daily with a meal.   [DISCONTINUED] Delgocitinib  (ANZUPGO ) 20 MG/GM CREA Apply 1 g topically in the morning and at bedtime. Apply 1 gram twice daily   No  facility-administered encounter medications on file as of 02/13/2024.     Lab Results  Component Value Date   WBC 7.5 03/30/2023   HGB 14.5 03/30/2023   HCT 42.8 03/30/2023   PLT 265.0 03/30/2023   GLUCOSE 135 (H) 02/13/2024   CHOL 190 12/12/2023   TRIG 121.0 12/12/2023   HDL 56.50 12/12/2023   LDLDIRECT 145.4 03/01/2013   LDLCALC 109 (H) 12/12/2023   ALT 52 (H) 02/13/2024   AST 33 02/13/2024   NA 139 02/13/2024   K 4.1 02/13/2024   CL 104 02/13/2024   CREATININE 0.64 02/13/2024   BUN 14  02/13/2024   CO2 26 02/13/2024   TSH 4.23 12/12/2023   HGBA1C 6.5 12/12/2023   MICROALBUR 2.4 (H) 12/12/2023    MM 3D SCREENING MAMMOGRAM BILATERAL BREAST Result Date: 06/08/2023 CLINICAL DATA:  Screening. EXAM: DIGITAL SCREENING BILATERAL MAMMOGRAM WITH TOMOSYNTHESIS AND CAD TECHNIQUE: Bilateral screening digital craniocaudal and mediolateral oblique mammograms were obtained. Bilateral screening digital breast tomosynthesis was performed. The images were evaluated with computer-aided detection. COMPARISON:  Previous exam(s). ACR Breast Density Category b: There are scattered areas of fibroglandular density. FINDINGS: There are no findings suspicious for malignancy. IMPRESSION: No mammographic evidence of malignancy. A result letter of this screening mammogram will be mailed directly to the patient. RECOMMENDATION: Screening mammogram in one year. (Code:SM-B-01Y) BI-RADS CATEGORY  1: Negative. Electronically Signed   By: Alm Parkins M.D.   On: 06/08/2023 15:09       Assessment & Plan:  Encounter for screening mammogram for malignant neoplasm of breast -     3D Screening Mammogram, Left and Right; Future  Primary hypertension Assessment & Plan: Blood pressure as outlined. Overall has improved. Continues f/u with Dr Raford. Taking coreg  and hydralazine . Hold on making changes today. Follow pressures. Follow metabolic panel.   Orders: -     Basic metabolic panel with GFR  Type 2 diabetes mellitus with hyperglycemia, without long-term current use of insulin  (HCC) Assessment & Plan: Continue low carb diet and exercise.  Discussed diet and exercise today.  Follow met b and A1c.  Lab Results  Component Value Date   HGBA1C 6.5 12/12/2023     Fatty liver Assessment & Plan: Diet, exercise and weight loss. Follow liver function tests.   Orders: -     Hepatic function panel  Hyperlipidemia, mixed Assessment & Plan: Low cholesterol diet and exercise. Follow lipid panel.    Vitamin  D deficiency Assessment & Plan: Check vitamin  D level.   Orders: -     VITAMIN D  25 Hydroxy (Vit-D Deficiency, Fractures)  Stress Assessment & Plan:   Increased stress with husband's health issues.  Appears to be handling things ok. Follow.    Paroxysmal A-fib [facility]) Assessment & Plan: S/p ablation.  Off xarelto.  Afib/symptoms improved overall.  Follow pressures. Followed by cardiology. Appears to be in SR today. Continue f/u with cardiology - given intermittent episodes. Consider monitor.    Diarrhea, unspecified type Assessment & Plan: Previous stool studies negative.  She has had persistent problems with diarrhea. (Ongoing for years). Symptoms seem to have been more noticeable after cholecystectomy 08/2018.  Work up to date has included - TTgIGA negative.  Lipase wnl.  CT abd/pelvis - diverticulosis, otherwise negative. Colonoscopy negative for microscopic colitis.  Previous TSH/T4 negative.  No weight loss.  Eating.  No nausea or vomiting.   Have discussed history of loose stools.  Declines cholestyramine. Notify me if changes her mind about starting coloestid. Has  seen Dr Trudy. 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 Treated with flagyl and biltricide. Stool issues continued. Felt they improved after treatment. Reports is currently being treated again after testing positive again. . Notify me if changes mind regarding colestid.       Allena Hamilton, MD "

## 2024-02-14 ENCOUNTER — Ambulatory Visit: Payer: Self-pay | Admitting: Internal Medicine

## 2024-02-15 ENCOUNTER — Other Ambulatory Visit: Payer: Self-pay | Admitting: Internal Medicine

## 2024-02-19 ENCOUNTER — Encounter: Payer: Self-pay | Admitting: Internal Medicine

## 2024-02-19 ENCOUNTER — Telehealth: Payer: Self-pay

## 2024-02-19 NOTE — Assessment & Plan Note (Signed)
 S/p ablation.  Off xarelto.  Afib/symptoms improved overall.  Follow pressures. Followed by cardiology. Appears to be in SR today. Continue f/u with cardiology - given intermittent episodes. Consider monitor.

## 2024-02-19 NOTE — Telephone Encounter (Signed)
 Anzupgo  PA denied by insurance. Please advise.

## 2024-02-19 NOTE — Assessment & Plan Note (Signed)
 Previous stool studies negative.  She has had persistent problems with diarrhea. (Ongoing for years). Symptoms seem to have been more noticeable after cholecystectomy 08/2018.  Work up to date has included - TTgIGA negative.  Lipase wnl.  CT abd/pelvis - diverticulosis, otherwise negative. Colonoscopy negative for microscopic colitis.  Previous TSH/T4 negative.  No weight loss.  Eating.  No nausea or vomiting.   Have discussed history of loose stools.  Declines cholestyramine. Notify me if changes her mind about starting coloestid. Has seen Dr Trudy. 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 Treated with flagyl and biltricide. Stool issues continued. Felt they improved after treatment. Reports is currently being treated again after testing positive again. . Notify me if changes mind regarding colestid.

## 2024-02-19 NOTE — Assessment & Plan Note (Signed)
"   Check vitamin D level.  "

## 2024-02-19 NOTE — Assessment & Plan Note (Signed)
 Continue low carb diet and exercise.  Discussed diet and exercise today.  Follow met b and A1c.  Lab Results  Component Value Date   HGBA1C 6.5 12/12/2023

## 2024-02-19 NOTE — Assessment & Plan Note (Signed)
 Increased stress with husband's health issues.  Appears to be handling things ok. Follow.

## 2024-02-19 NOTE — Assessment & Plan Note (Signed)
 Low cholesterol diet and exercise.  Follow lipid panel.

## 2024-02-19 NOTE — Assessment & Plan Note (Signed)
Diet, exercise and weight loss. Follow liver function tests.   

## 2024-02-19 NOTE — Assessment & Plan Note (Signed)
 Blood pressure as outlined. Overall has improved. Continues f/u with Dr Raford. Taking coreg  and hydralazine . Hold on making changes today. Follow pressures. Follow metabolic panel.

## 2024-03-04 NOTE — Telephone Encounter (Signed)
 I spoke with patient and she was unable to get Anzupgo , but is also hesitant to use it due to potential side effects. Pt states that her hands are doing well and she defers tx at this time.

## 2024-03-17 ENCOUNTER — Other Ambulatory Visit: Payer: Self-pay

## 2024-03-17 ENCOUNTER — Emergency Department

## 2024-03-17 ENCOUNTER — Observation Stay: Admission: EM | Admit: 2024-03-17 | Discharge: 2024-03-18 | Disposition: A

## 2024-03-17 DIAGNOSIS — Z79899 Other long term (current) drug therapy: Secondary | ICD-10-CM | POA: Insufficient documentation

## 2024-03-17 DIAGNOSIS — E119 Type 2 diabetes mellitus without complications: Secondary | ICD-10-CM | POA: Insufficient documentation

## 2024-03-17 DIAGNOSIS — K5791 Diverticulosis of intestine, part unspecified, without perforation or abscess with bleeding: Secondary | ICD-10-CM | POA: Insufficient documentation

## 2024-03-17 DIAGNOSIS — K922 Gastrointestinal hemorrhage, unspecified: Principal | ICD-10-CM | POA: Diagnosis present

## 2024-03-17 DIAGNOSIS — K5732 Diverticulitis of large intestine without perforation or abscess without bleeding: Secondary | ICD-10-CM | POA: Insufficient documentation

## 2024-03-17 DIAGNOSIS — I10 Essential (primary) hypertension: Secondary | ICD-10-CM | POA: Insufficient documentation

## 2024-03-17 DIAGNOSIS — R945 Abnormal results of liver function studies: Secondary | ICD-10-CM

## 2024-03-17 DIAGNOSIS — K5792 Diverticulitis of intestine, part unspecified, without perforation or abscess without bleeding: Secondary | ICD-10-CM | POA: Insufficient documentation

## 2024-03-17 HISTORY — DX: Diverticulosis of intestine, part unspecified, without perforation or abscess without bleeding: K57.90

## 2024-03-17 LAB — CBC WITH DIFFERENTIAL/PLATELET
Abs Immature Granulocytes: 0.06 10*3/uL (ref 0.00–0.07)
Basophils Absolute: 0.1 10*3/uL (ref 0.0–0.1)
Basophils Relative: 1 %
Eosinophils Absolute: 0.1 10*3/uL (ref 0.0–0.5)
Eosinophils Relative: 1 %
HCT: 40.9 % (ref 36.0–46.0)
Hemoglobin: 14 g/dL (ref 12.0–15.0)
Immature Granulocytes: 1 %
Lymphocytes Relative: 15 %
Lymphs Abs: 1.8 10*3/uL (ref 0.7–4.0)
MCH: 31.4 pg (ref 26.0–34.0)
MCHC: 34.2 g/dL (ref 30.0–36.0)
MCV: 91.7 fL (ref 80.0–100.0)
Monocytes Absolute: 0.8 10*3/uL (ref 0.1–1.0)
Monocytes Relative: 6 %
Neutro Abs: 9.1 10*3/uL — ABNORMAL HIGH (ref 1.7–7.7)
Neutrophils Relative %: 76 %
Platelets: 223 10*3/uL (ref 150–400)
RBC: 4.46 MIL/uL (ref 3.87–5.11)
RDW: 13.3 % (ref 11.5–15.5)
Smear Review: NORMAL
WBC: 11.9 10*3/uL — ABNORMAL HIGH (ref 4.0–10.5)
nRBC: 0 % (ref 0.0–0.2)

## 2024-03-17 LAB — URINALYSIS, ROUTINE W REFLEX MICROSCOPIC
Bilirubin Urine: NEGATIVE
Glucose, UA: NEGATIVE mg/dL
Ketones, ur: NEGATIVE mg/dL
Leukocytes,Ua: NEGATIVE
Nitrite: NEGATIVE
Protein, ur: NEGATIVE mg/dL
Specific Gravity, Urine: 1.021 (ref 1.005–1.030)
pH: 5 (ref 5.0–8.0)

## 2024-03-17 LAB — COMPREHENSIVE METABOLIC PANEL WITH GFR
ALT: 51 U/L — ABNORMAL HIGH (ref 0–44)
AST: 46 U/L — ABNORMAL HIGH (ref 15–41)
Albumin: 4.3 g/dL (ref 3.5–5.0)
Alkaline Phosphatase: 79 U/L (ref 38–126)
Anion gap: 13 (ref 5–15)
BUN: 12 mg/dL (ref 6–20)
CO2: 23 mmol/L (ref 22–32)
Calcium: 9.2 mg/dL (ref 8.9–10.3)
Chloride: 100 mmol/L (ref 98–111)
Creatinine, Ser: 0.6 mg/dL (ref 0.44–1.00)
GFR, Estimated: >60 mL/min
Glucose, Bld: 147 mg/dL — ABNORMAL HIGH (ref 70–99)
Potassium: 4.5 mmol/L (ref 3.5–5.1)
Sodium: 136 mmol/L (ref 135–145)
Total Bilirubin: 0.5 mg/dL (ref 0.0–1.2)
Total Protein: 7.4 g/dL (ref 6.5–8.1)

## 2024-03-17 LAB — HEMOGLOBIN AND HEMATOCRIT, BLOOD
HCT: 39.5 % (ref 36.0–46.0)
HCT: 37.6 % (ref 36.0–46.0)
Hemoglobin: 13.5 g/dL (ref 12.0–15.0)
Hemoglobin: 12.9 g/dL (ref 12.0–15.0)

## 2024-03-17 LAB — LIPASE, BLOOD: Lipase: 23 U/L (ref 11–51)

## 2024-03-17 MED ORDER — PIPERACILLIN-TAZOBACTAM 3.375 G IVPB
3.3750 g | Freq: Once | INTRAVENOUS | Status: AC
  Administered 2024-03-17: 3.375 g via INTRAVENOUS
  Filled 2024-03-17: qty 50

## 2024-03-17 MED ORDER — HYDRALAZINE HCL 25 MG PO TABS
25.0000 mg | ORAL_TABLET | Freq: Three times a day (TID) | ORAL | Status: DC
  Administered 2024-03-17 – 2024-03-18 (×2): 25 mg via ORAL
  Filled 2024-03-17 (×2): qty 1

## 2024-03-17 MED ORDER — PIPERACILLIN-TAZOBACTAM 3.375 G IVPB
3.3750 g | Freq: Three times a day (TID) | INTRAVENOUS | Status: DC
  Administered 2024-03-17 – 2024-03-18 (×2): 3.375 g via INTRAVENOUS
  Filled 2024-03-17 (×2): qty 50

## 2024-03-17 MED ORDER — ONDANSETRON HCL 4 MG PO TABS: 4.0000 mg | ORAL_TABLET | Freq: Four times a day (QID) | ORAL | Status: DC | PRN

## 2024-03-17 MED ORDER — HYDROMORPHONE HCL 1 MG/ML IJ SOLN: 0.5000 mg | INTRAMUSCULAR | Status: DC | PRN

## 2024-03-17 MED ORDER — IOHEXOL 350 MG/ML SOLN
100.0000 mL | Freq: Once | INTRAVENOUS | Status: AC | PRN
  Administered 2024-03-17: 100 mL via INTRAVENOUS

## 2024-03-17 MED ORDER — SENNOSIDES-DOCUSATE SODIUM 8.6-50 MG PO TABS: 1.0000 | ORAL_TABLET | Freq: Every evening | ORAL | Status: DC | PRN

## 2024-03-17 MED ORDER — RISAQUAD PO CAPS
1.0000 | ORAL_CAPSULE | Freq: Three times a day (TID) | ORAL | Status: DC
  Administered 2024-03-17 – 2024-03-18 (×3): 1 via ORAL
  Filled 2024-03-17 (×3): qty 1

## 2024-03-17 MED ORDER — TRAZODONE HCL 50 MG PO TABS: 25.0000 mg | ORAL_TABLET | Freq: Every evening | ORAL | Status: DC | PRN

## 2024-03-17 MED ORDER — ONDANSETRON HCL 4 MG/2ML IJ SOLN: 4.0000 mg | Freq: Four times a day (QID) | INTRAMUSCULAR | Status: DC | PRN

## 2024-03-17 MED ORDER — HYDRALAZINE HCL 10 MG PO TABS
10.0000 mg | ORAL_TABLET | Freq: Three times a day (TID) | ORAL | Status: DC
  Administered 2024-03-17: 10 mg via ORAL
  Filled 2024-03-17: qty 1

## 2024-03-17 MED ORDER — ACETAMINOPHEN 325 MG PO TABS: 650.0000 mg | ORAL_TABLET | Freq: Four times a day (QID) | ORAL | Status: DC | PRN

## 2024-03-17 MED ORDER — HYDRALAZINE HCL 20 MG/ML IJ SOLN: 5.0000 mg | Freq: Four times a day (QID) | INTRAMUSCULAR | Status: DC | PRN

## 2024-03-17 MED ORDER — ACETAMINOPHEN 650 MG RE SUPP: 650.0000 mg | Freq: Four times a day (QID) | RECTAL | Status: DC | PRN

## 2024-03-17 MED ORDER — PRAZIQUANTEL 600 MG PO TABS: 600.0000 mg | ORAL_TABLET | Freq: Three times a day (TID) | ORAL | Status: DC

## 2024-03-17 MED ORDER — SODIUM CHLORIDE 0.9 % IV SOLN: INTRAVENOUS | Status: AC

## 2024-03-17 NOTE — ED Notes (Signed)
 Advised nurse that patient has ready bed

## 2024-03-17 NOTE — ED Triage Notes (Addendum)
 Pt c/o lower abdominal pain and bright red rectal bleeding w/ BM starting this morning. Pain score 3/10.  Pt reports previous bleeding w/ hemorrhoids.  Hx of diverticulosis.   Pt reports chronic diarrhea and recently finished a course of Flagyl d/t a tape worm and giardia.

## 2024-03-17 NOTE — H&P (Signed)
 " History and Physical    JAQUELYN SAKAMOTO FMW:[id number] DOB: [birth year] DOA: 03/17/2024  PCP: Glendia Shad, MD (Confirm with patient/family/NH records and if not entered, this has to be entered at [facility] point of entry) Patient coming from: Home  I have personally briefly reviewed patient's old medical records in [facility]  Chief Complaint: Rectal bleed  HPI: MARCENA DIAS is a 59 y.o. 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  This morning patient wake up with severe lower abdominal pain.  Soon follow-up, she passed a large amount of bright red blood, but denied any tenesmus, no fever chills no nausea vomiting.  ED Course: Afebrile, blood pressure 129/73 with exertion 96% on room air.  CT positive for active bleeding at junction of descending colon and sigmoid colon signs of diverticulitis also seen.  Blood work showed WBC 10.0 BUN 12 creatinine 0.9 glucose 146.  GI and IR consulted in ER, both recommended conservative management.  Zosyn  was started in ED.  Review of Systems: As per HPI otherwise 14 point review of systems negative.    Past Medical History:  Diagnosis Date   Allergy    Anemia    Angio-edema    Arthritis    Atrial fibrillation (HCC)    Complication of anesthesia    Diabetes mellitus without complication (HCC)    diet controlled   Diverticulosis    Dysplastic nevus 06/27/2018   Left upper back. Moderate atypia, limited margins free.    Eczema    Epstein Barr infection    Family history of anesthesia complication    Mother - confusion   Fatty liver    Heart murmur    Slight - nothing to worry about   Hemorrhoid     Hypertension    Hypothyroidism    Neuromuscular disorder (HCC)    PONV (postoperative nausea and vomiting)    only with c-sections   Sleep apnea    mild    Past Surgical History:  Procedure Laterality Date   CESAREAN SECTION     CHOLECYSTECTOMY N/A 09/07/2018   Procedure: LAPAROSCOPIC CHOLECYSTECTOMY;  Surgeon: Rodolph Romano, MD;  Location: [facility] ORS;  Service: General;  Laterality: N/A;   FINGER SURGERY     pin to 3rd finger Right hand   LUMBAR LAMINECTOMY/DECOMPRESSION MICRODISCECTOMY  12/15/2011   Procedure: LUMBAR LAMINECTOMY/DECOMPRESSION MICRODISCECTOMY 1 LEVEL;  Surgeon: Reyes JONETTA Budge, MD;  Location: [facility] OR[facility];  Service: Neurosurgery;  Laterality: Right;  RIGHT Lumbar four-Five diskectomy   LUMBAR LAMINECTOMY/DECOMPRESSION MICRODISCECTOMY N/A 11/04/2019   Procedure: MICRODISCECTOMY LEFT LUMBAR FIVE SACRAL ONE;  Surgeon: Budge Reyes, MD;  Location: [facility] OR;  Service: Neurosurgery;  Laterality: N/A;  3C   MICRODISCECTOMY LUMBAR     L4-5   SPINE SURGERY     WISDOM TOOTH EXTRACTION       reports that she has never smoked. She has never been exposed to tobacco smoke. She has never used smokeless tobacco. She reports that she does not currently use alcohol. She reports that she does not use drugs.  Allergies[1]  Family History  Problem Relation Age of Onset   Hypertension Mother    GER disease Mother    Hyperlipidemia Mother    Vision  loss Mother    Heart failure Father    Hypertension Father    Diabetes Father    Cancer Father        Melanoma, Prostate   Heart disease Father    Early death Father    Breast cancer Sister 57   Arthritis Sister    Cancer Sister    Stroke Maternal Grandmother    Heart attack Maternal Grandfather    Heart disease Maternal Grandfather    Diabetes Paternal Grandfather      Prior to Admission medications  Medication Sig Start Date End Date Taking? Authorizing Provider  praziquantel  (BILTRICIDE ) 600 MG tablet Take  600 mg by mouth 3 (three) times daily. 03/15/24  Yes [provider]  carvedilol  (COREG ) 12.5 MG tablet TAKE 1 TABLET (12.5MG  TOTAL) BY MOUTH TWICE A DAY WITH MEALS 02/16/24   Glendia Shad, MD  EPINEPHrine  (EPIPEN  2-PAK) 0.3 mg/0.3 mL IJ SOAJ injection Inject 0.3 mg into the muscle as needed for anaphylaxis. 01/10/23   Glendia Shad, MD  hydrALAZINE  (APRESOLINE ) 10 MG tablet TAKE 1 TABLET (10 MG TOTAL) BY MOUTH 3 (THREE) TIMES DAILY. TO TAKE WITH THE 25 MG TABLETS 02/08/24   Raford Riggs, MD  hydrALAZINE  (APRESOLINE ) 25 MG tablet Take 1 tablet (25 mg total) by mouth 3 (three) times daily. To take with the 10 mg tablets 02/08/24   Raford Riggs, MD  hydrocortisone  (ANUSOL -HC) 25 MG suppository PLACE 1 SUPPOSITORY RECTALLY 2 (TWO) TIMES DAILY AS NEEDED FOR HEMORRHOIDS OR ANAL ITCHING. 10/25/23   Glendia Shad, MD  hydrocortisone  2.5 % cream Apply twice a day for up to 2 weeks as needed for rash. 02/05/24   Jackquline Sawyer, MD  ketoconazole  (NIZORAL ) 2 % cream Apply twice a day as needed for rash. 02/05/24   Stewart, Tara, MD  Magnesium Bisglycinate Dihyd POWD Take 400 mg by mouth See admin instructions. Pt takes 2-4 times per week    [provider]  NALTREXONE  HCL, PAIN, PO Take by mouth. Patient taking differently: Take 0.5 mg by mouth.    [provider]  VITAMIN D  PO Take by mouth.    [provider]    Physical Exam: Vitals:   03/17/24 0919 03/17/24 0920 03/17/24 0930 03/17/24 1300  BP: (!) 165/71  (!) 167/93 (!) 149/73  Pulse: 82  91 84  Resp: 20   17  Temp: 98.2 F (36.8 C)   98.7 F (37.1 C)  TempSrc: Oral   Oral  SpO2: 96%  96% 95%  Weight:  118.4 kg    Height:  5' 6 (1.676 m)      Constitutional: NAD, calm, comfortable Vitals:   03/17/24 0919 03/17/24 0920 03/17/24 0930 03/17/24 1300  BP: (!) 165/71  (!) 167/93 (!) 149/73  Pulse: 82  91 84  Resp: 20   17  Temp: 98.2 F (36.8 C)   98.7 F (37.1 C)  TempSrc: Oral   Oral   SpO2: 96%  96% 95%  Weight:  118.4 kg    Height:  5' 6 (1.676 m)     Eyes: PERRL, lids and conjunctivae normal ENMT: Mucous membranes are dry. Posterior pharynx clear of any exudate or lesions.Normal dentition.  Neck: normal, supple, no masses, no thyromegaly Respiratory: clear to auscultation bilaterally, no wheezing, no crackles. Normal respiratory effort. No accessory muscle use.  Cardiovascular: Regular rate and rhythm, no murmurs / rubs / gallops. No extremity edema. 2+ pedal pulses. No carotid bruits.  Abdomen: LLQ tenderness, no rebound no  guarding, no masses palpated. No hepatosplenomegaly. Bowel sounds positive.  Musculoskeletal: no clubbing / cyanosis. No joint deformity upper and lower extremities. Good ROM, no contractures. Normal muscle tone.  Skin: no rashes, lesions, ulcers. No induration Neurologic: CN 2-12 grossly intact. Sensation intact, DTR normal. Strength 5/5 in all 4.  Psychiatric: Normal judgment and insight. Alert and oriented x 3. Normal mood.     Labs on Admission: I have personally reviewed following labs and imaging studies  CBC: Recent Labs  Lab 03/17/24 0942 03/17/24 1301  WBC 11.9*  --   NEUTROABS 9.1*  --   HGB 14.0 13.5  HCT 40.9 39.5  MCV 91.7  --   PLT 223  --    Basic Metabolic Panel: Recent Labs  Lab 03/17/24 0942  NA 136  K 4.5  CL 100  CO2 23  GLUCOSE 147*  BUN 12  CREATININE 0.60  CALCIUM 9.2   GFR: Estimated Creatinine Clearance: 102.8 mL/min (by C-G formula based on SCr of 0.6 mg/dL). Liver Function Tests: Recent Labs  Lab 03/17/24 0942  AST 46*  ALT 51*  ALKPHOS 79  BILITOT 0.5  PROT 7.4  ALBUMIN 4.3   Recent Labs  Lab 03/17/24 0942  LIPASE 23   No results for input(s): AMMONIA in the last 168 hours. Coagulation Profile: No results for input(s): INR, PROTIME in the last 168 hours. Cardiac Enzymes: No results for input(s): CKTOTAL, CKMB, CKMBINDEX, TROPONINI in the last 168 hours. BNP (last  3 results) No results for input(s): PROBNP in the last 8760 hours. HbA1C: No results for input(s): HGBA1C in the last 72 hours. CBG: No results for input(s): GLUCAP in the last 168 hours. Lipid Profile: No results for input(s): CHOL, HDL, LDLCALC, TRIG, CHOLHDL, LDLDIRECT in the last 72 hours. Thyroid  Function Tests: No results for input(s): TSH, T4TOTAL, FREET4, T3FREE, THYROIDAB in the last 72 hours. Anemia Panel: No results for input(s): VITAMINB12, FOLATE, FERRITIN, TIBC, IRON, RETICCTPCT in the last 72 hours. Urine analysis:    Component Value Date/Time   COLORURINE YELLOW (A) 03/17/2024 0942   APPEARANCEUR CLEAR (A) 03/17/2024 0942   LABSPEC 1.021 03/17/2024 0942   PHURINE 5.0 03/17/2024 0942   GLUCOSEU NEGATIVE 03/17/2024 0942   GLUCOSEU NEGATIVE 05/07/2021 0811   HGBUR SMALL (A) 03/17/2024 0942   BILIRUBINUR NEGATIVE 03/17/2024 0942   KETONESUR NEGATIVE 03/17/2024 0942   PROTEINUR NEGATIVE 03/17/2024 0942   UROBILINOGEN 0.2 05/07/2021 0811   NITRITE NEGATIVE 03/17/2024 0942   LEUKOCYTESUR NEGATIVE 03/17/2024 0942    Radiological Exams on Admission: CT ANGIO GI BLEED Result Date: 03/17/2024 EXAM: CTA ABDOMEN AND PELVIS WITH CONTRAST 03/17/2024 11:13:00 AM TECHNIQUE: CTA images of the abdomen and pelvis with 100 mL of iohexol  (OMNIPAQUE ) 350 MG/ML injection intravenous contrast. Three-dimensional MIP/volume rendered formations were performed. Automated exposure control, iterative reconstruction, and/or weight based adjustment of the mA/kV was utilized to reduce the radiation dose to as low as reasonably achievable. COMPARISON: 12/16/2005 CLINICAL HISTORY: Lower GI bleed, recently treated for Giardia, tapeworm, external hemorrhoids on exam. FINDINGS: VASCULATURE: GI BLEED: Subtle intraluminal hyperdensity at the descending/sigmoid junction on arterial and delayed phase images suggesting active extravasation. AORTA: Minimal aortic calcified  plaque. No acute finding. No abdominal aortic aneurysm. No dissection. CELIAC TRUNK: No acute finding. No occlusion or significant stenosis. SUPERIOR MESENTERIC ARTERY: No acute finding. No occlusion or significant stenosis. INFERIOR MESENTERIC ARTERY: No acute finding. No occlusion or significant stenosis. RENAL ARTERIES: Single right renal artery, widely patent. 3 left renal arteries, superior dominant,  all patent. The middle and inferior left renal arteries have aberrant origins from the aorta, below the level of the IMA. ILIAC ARTERIES: No acute finding. No occlusion or significant stenosis. ABDOMEN/PELVIS: LOWER CHEST: Visualized portion of the lower chest demonstrates no acute abnormality. LIVER: The liver is unremarkable. GALLBLADDER AND BILE DUCTS: Cholecystectomy clips are present, indicating prior cholecystectomy. No biliary ductal dilatation. SPLEEN: The spleen is unremarkable. PANCREAS: The pancreas is unremarkable. ADRENAL GLANDS: Bilateral adrenal glands demonstrate no acute abnormality. KIDNEYS, URETERS AND BLADDER: No stones in the kidneys or ureters. No hydronephrosis. No perinephric or periureteral stranding. Urinary bladder is unremarkable. GI AND BOWEL: Stomach and duodenal sweep demonstrate no acute abnormality. There is no bowel obstruction. Multiple descending and sigmoid diverticula. Development of subtle intraluminal hyperdensity at the descending/sigmoid junction on arterial and delayed phase images suggesting active extravasation. There are some mild inflammatory/edematous changes at the descending/sigmoid junction suggesting diverticulitis. No abscess or perforation. REPRODUCTIVE: Reproductive organs are unremarkable. PERITONEUM AND RETROPERITONEUM: No ascites or free air. LYMPH NODES: No lymphadenopathy. BONES AND SOFT TISSUES: Mild degenerative disc disease at L4-L5 and S1. 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 No acute soft tissue abnormality. IMPRESSION:  1. Active extravasation at the descending/sigmoid junction, consistent with ongoing lower GI bleeding. 2. Diverticulosis with Mild inflammatory/edematous changes at the descending/sigmoid junction, consistent with diverticulitis. No abscess or free air. Electronically signed by: Dayne Hassell MD 03/17/2024 12:06 PM EST RP Workstation: [id number]    EKG: Independently reviewed.  Sinus rhythm, no acute ST changes  Assessment/Plan Principal Problem:   Lower GI bleed Active Problems:   Diverticulitis   Diverticulosis of intestine with bleeding  (please populate well all problems here in Problem List. (For example, if patient is on BP meds at home and you resume or decide to hold them, it is a problem that needs to be her. Same for CAD, COPD, HLD and so on)  Hematochezia Acute diverticulitis - Continue Zosyn  - Case was discussed between ED physician IR and GI attending, both GI and IR recommended conservative management. IR reviewed the image study and impression is there is no signs of arterial bleeding, thus no indication for IR embolization. - Recheck H&H this afternoon and this evening transfuse for hemodynamic instability - Probiotics - Outpatient follow-up with GI for colonoscopy in 6 weeks.  HTN - Hold off home BP meds - Start as needed hydralazine   DVT prophylaxis: SCD Code Status: Full code Family Communication: Husband present Disposition Plan: Patient is sick with acute lower GI bleed acute diverticulitis requiring IV antibiotics, expect more than 2 midnight hospital stay. Consults called: IR and GI Admission status: Tele admit   Cort ONEIDA Mana MD [facility] Pager [phone]  03/17/2024, 1:13 PM       [1]  Allergies Allergen Reactions   Angiotensin Receptor Blockers Swelling    Angioedema Had past reaction of arb and angioedema with ace   Lisinopril  Swelling   Amlodipine  Besylate Itching   Gluten Meal Other (See Comments)    Other reaction(s): Other (See  Comments)   Morphine  And Codeine Itching    Slight itching around nose area   "

## 2024-03-17 NOTE — ED Notes (Signed)
 Dr Nicholaus chaperoned during pts rectal exam

## 2024-03-17 NOTE — Plan of Care (Signed)
   Problem: Clinical Measurements: Goal: Ability to maintain clinical measurements within normal limits will improve Outcome: Progressing   Problem: Nutrition: Goal: Adequate nutrition will be maintained Outcome: Progressing   Problem: Elimination: Goal: Will not experience complications related to bowel motility Outcome: Progressing

## 2024-03-17 NOTE — Progress Notes (Signed)
 Brief GI Consult Note Case d/w Emergency Dept physician on phone- not physically seen/examined by me  59 y/o CF c pan diverticulosis (on csy 2022-[facility]) p/w BRBPR 2/2 diverticular bleed.  Hgb 14 and VSS at this time. CTA performed demonstrating active extravasation in the descending colon Also noted diverticulitis distal to this  Colonoscopy of low yield in active diverticular bleed and contraindicated in active diverticulitis.  Recommend IR consultation for embolization consideration, otherwise supportive care from gi standpoint.  Hold dvt ppx. Pt not on oac/anti plt agents Monitor H&H.  Transfusion and resuscitation as per primary team Avoid frequent lab draws to prevent lab induced anemia Supportive care and antiemetics as per primary team Maintain two sites IV access Avoid nsaids Monitor for GIB.  Formal GI consult to follow tomorrow.  Joy EMERSON Jungling, DO [facility]

## 2024-03-17 NOTE — Progress Notes (Signed)
 Patient ID: Joy Houston, female   DOB: 1965-06-26, 59 y.o.   MRN: [id number]   Called to review CTA for lower GIB  CTA finding is a very subtle small hyperdensity at the LLQ focal acute sigmoid diverticulitis.  Certainly NOT a brisk arterial bleeding site by CTA.  Hgb stable at 14.  VSS.    Agree w GI note and admit for observation, bedrest, keep npo for now, trend hgb and vitals, IV abx, and hold any anticoags.  IR will follow and see tomorrow.

## 2024-03-17 NOTE — ED Provider Notes (Signed)
 "  [facility] Provider Note    Event Date/Time   First MD Initiated Contact with Patient 03/17/24 [phone]     (approximate)   History   Abdominal Pain and Rectal Bleeding   HPI  Joy Houston is a 59 y.o. female  hx of hypertension, OSA, and PAF not on any anticoagulation who presents to the emergency department with 1 day of bright red blood per rectum.  Patient states that she chronically has diarrhea but it has acutely worsened over the past several weeks that she has been treated for Giardia and a tapeworm.  Today while using the restroom she had a gush of blood.  She reports some abdominal discomfort.  Reports her last colonoscopy was 3 years ago.  She presents with her significant other who helps contribute to the history     Physical Exam   Triage Vital Signs: ED Triage Vitals  Encounter Vitals Group     BP 03/17/24 0919 (!) 165/71     Girls Systolic BP Percentile --      Girls Diastolic BP Percentile --      Boys Systolic BP Percentile --      Boys Diastolic BP Percentile --      Pulse Rate 03/17/24 0919 82     Resp 03/17/24 0919 20     Temp 03/17/24 0919 98.2 F (36.8 C)     Temp Source 03/17/24 0919 Oral     SpO2 03/17/24 0919 96 %     Weight 03/17/24 0920 261 lb (118.4 kg)     Height 03/17/24 0920 5' 6 (1.676 m)     Head Circumference --      Peak Flow --      Pain Score 03/17/24 0920 3     Pain Loc --      Pain Education --      Exclude from Growth Chart --     Most recent vital signs: Vitals:   03/17/24 1300 03/17/24 1537  BP: (!) 149/73 (!) 148/88  Pulse: 84 77  Resp: 17 18  Temp: 98.7 F (37.1 C) 98 F (36.7 C)  SpO2: 95% 97%    Nursing Triage Note reviewed. Vital signs reviewed and patients oxygen saturation is normoxic  General: Patient is well nourished, well developed, awake and alert, resting comfortably in no acute distress Head: Normocephalic and atraumatic Eyes: Normal inspection, extraocular muscles intact,  no conjunctival pallor Ear, nose, throat: Normal external exam Neck: Normal range of motion Respiratory: Patient is in no respiratory distress, lungs CTAB Cardiovascular: Patient is not tachycardic, RRR without murmur appreciated GI: Abd SNT with no guarding or rebound  GU rectal: Patient has external hemorrhoids but nothing actively bleeding.  Has gross blood on rectal exam.  This was performed with chaperone Geni RN in the room Back: Normal inspection of the back with good strength and range of motion throughout all ext Extremities: pulses intact with good cap refills, no LE pitting edema or calf tenderness Neuro: The patient is alert and oriented to person, place, and time, appropriately conversive, with 5/5 bilat UE/LE strength, no gross motor or sensory defects noted. Coordination appears to be adequate. Skin: Warm, dry, and intact Psych: normal mood and affect, no SI or HI  ED Results / Procedures / Treatments   Labs (all labs ordered are listed, but only abnormal results are displayed) Labs Reviewed  CBC WITH DIFFERENTIAL/PLATELET - Abnormal; Notable for the following components:      Result  Value   WBC 11.9 (*)    Neutro Abs 9.1 (*)    All other components within normal limits  COMPREHENSIVE METABOLIC PANEL WITH GFR - Abnormal; Notable for the following components:   Glucose, Bld 147 (*)    AST 46 (*)    ALT 51 (*)    All other components within normal limits  URINALYSIS, ROUTINE W REFLEX MICROSCOPIC - Abnormal; Notable for the following components:   Color, Urine YELLOW (*)    APPearance CLEAR (*)    Hgb urine dipstick SMALL (*)    Bacteria, UA RARE (*)    All other components within normal limits  LIPASE, BLOOD  HEMOGLOBIN AND HEMATOCRIT, BLOOD  HEMOGLOBIN AND HEMATOCRIT, BLOOD  HIV ANTIBODY (ROUTINE TESTING W REFLEX)  CBC     EKG EKG and rhythm strip are interpreted by myself:   EKG: [Normal sinus rhythm] at heart rate of 80, normal QRS duration, QTc 439,  nonspecific ST segments and T waves no ectopy EKG not consistent with Acute STEMI Rhythm strip: NSR in lead II   RADIOLOGY CTA GI bleed: Diverticulitis with active extravasation on my independent review and interpretation radiologist agrees    PROCEDURES:  Critical Care performed: Yes, see critical care procedure note(s)  .Critical Care  Performed by: Nicholaus Rolland BRAVO, MD Authorized by: Nicholaus Rolland BRAVO, MD   Critical care provider statement:    Critical care time (minutes):  35   Critical care was necessary to treat or prevent imminent or life-threatening deterioration of the following conditions: Active gastroenterology hemorrhage.   Critical care was time spent personally by me on the following activities:  Blood draw for specimens, ordering and performing treatments and interventions, ordering and review of laboratory studies, development of treatment plan with patient or surrogate, discussions with consultants, discussions with primary provider, pulse oximetry, ordering and review of radiographic studies, re-evaluation of patient's condition, evaluation of patient's response to treatment and examination of patient   Care discussed with: admitting provider      MEDICATIONS ORDERED IN ED: Medications  acidophilus (RISAQUAD) capsule 1 capsule (1 capsule Oral Given 03/17/24 1623)  piperacillin -tazobactam (ZOSYN ) IVPB 3.375 g (has no administration in time range)  0.9 %  sodium chloride  infusion ( Intravenous New Bag/Given 03/17/24 1430)  ondansetron  (ZOFRAN ) tablet 4 mg (has no administration in time range)    Or  ondansetron  (ZOFRAN ) injection 4 mg (has no administration in time range)  acetaminophen  (TYLENOL ) tablet 650 mg (has no administration in time range)    Or  acetaminophen  (TYLENOL ) suppository 650 mg (has no administration in time range)  HYDROmorphone  (DILAUDID ) injection 0.5-1 mg (has no administration in time range)  traZODone  (DESYREL ) tablet 25 mg (has no  administration in time range)  senna-docusate (Senokot-S) tablet 1 tablet (has no administration in time range)  hydrALAZINE  (APRESOLINE ) injection 5 mg (has no administration in time range)  hydrALAZINE  (APRESOLINE ) tablet 25 mg (has no administration in time range)  iohexol  (OMNIPAQUE ) 350 MG/ML injection 100 mL (100 mLs Intravenous Contrast Given 03/17/24 1113)  piperacillin -tazobactam (ZOSYN ) IVPB 3.375 g (0 g Intravenous Stopped 03/17/24 1628)     IMPRESSION / MDM / ASSESSMENT AND PLAN / ED COURSE                                Differential diagnosis includes, but is not limited to, acute anemia, GI bleed, electrolyte derangement, hemorrhoids  ED course: Reassured that patient appears  well-perfused and not hypotensive.  She does have gross blood on rectal exam.  She was not acutely anemic with CTA GI bleed did demonstrate active extravasation in the setting of diverticulitis.  She was given Zosyn  and her case was discussed with gastroenterology consultant and also interventional radiologist.  Both state to pursue supportive care at this time.  Patient admitted to the hospitalist   Clinical Course as of 03/17/24 1752  [place] Mar 17, 2024  1210 CT ANGIO GI BLEED Acute extravasation [HD]  1211 Will talk to gastroenterology given active extravasation in the lower rectum [HD]  1214 Case discussed with Dr. Marcey Jungling for gastroenterology.  He states that he recommends treating the diverticulitis but they would not do a colonoscopy given that infection, and is unsure IR would do anything as well [HD]  1218 Talking with IR consultant Dr. Johann [HD]  1222 Dr. Johann says he is not on for primary and it is Dr. Vanice will send him a message [HD]  1227 Dr. Vanice paged [HD]  1233 Dr. Majel did call back and right now recommends supportive care [HD]  1244 Per IR: very subtle finding at site of focal diverticulitis, certainly NOT a brisk arterial bleed on the CTA.  agree with adm for observation,  bedrest, keep npo for now and trend hgb / vitals. Agree w GI note. [HD]  1301 Case discussed with hospitalist for admission [HD]    Clinical Course User Index [HD] Nicholaus Rolland BRAVO, MD   -- Risk: 5 This patient has a high risk of morbidity due to further diagnostic testing or treatment. Rationale: This patients evaluation and management involve a high risk of morbidity due to the potential severity of presenting symptoms, need for diagnostic testing, and/or initiation of treatment that may require close monitoring. The differential includes conditions with potential for significant deterioration or requiring escalation of care. Treatment decisions in the ED, including medication administration, procedural interventions, or disposition planning, reflect this level of risk. COPA: 5 The patient has the following acute or chronic illness/injury that poses a possible threat to life or bodily function: [X] : The patient has a potentially serious acute condition or an acute exacerbation of a chronic illness requiring urgent evaluation and management in the Emergency Department. The clinical presentation necessitates immediate consideration of life-threatening or function-threatening diagnoses, even if they are ultimately ruled out.   FINAL CLINICAL IMPRESSION(S) / ED DIAGNOSES   Final diagnoses:  Lower GI bleed  Diverticulitis     Rx / DC Orders   ED Discharge Orders     None        Note:  This document was prepared using Dragon voice recognition software and may include unintentional dictation errors.   Nicholaus Rolland BRAVO, MD 03/17/24 1752  "

## 2024-03-18 ENCOUNTER — Other Ambulatory Visit: Payer: Self-pay

## 2024-03-18 DIAGNOSIS — K922 Gastrointestinal hemorrhage, unspecified: Secondary | ICD-10-CM | POA: Diagnosis not present

## 2024-03-18 LAB — CBC
HCT: 38.7 % (ref 36.0–46.0)
Hemoglobin: 12.9 g/dL (ref 12.0–15.0)
MCH: 31 pg (ref 26.0–34.0)
MCHC: 33.3 g/dL (ref 30.0–36.0)
MCV: 93 fL (ref 80.0–100.0)
Platelets: 231 10*3/uL (ref 150–400)
RBC: 4.16 MIL/uL (ref 3.87–5.11)
RDW: 13.4 % (ref 11.5–15.5)
WBC: 9.4 10*3/uL (ref 4.0–10.5)
nRBC: 0 % (ref 0.0–0.2)

## 2024-03-18 MED ORDER — CARVEDILOL 12.5 MG PO TABS: 12.5000 mg | ORAL_TABLET | Freq: Two times a day (BID) | ORAL | Status: DC

## 2024-03-18 MED ORDER — AMOXICILLIN-POT CLAVULANATE 875-125 MG PO TABS
1.0000 | ORAL_TABLET | Freq: Two times a day (BID) | ORAL | 0 refills | Status: AC
  Filled 2024-03-18: qty 12, 6d supply, fill #0

## 2024-03-18 MED ORDER — NALTREXONE HCL (PAIN) 1.5 MG PO CAPS: 0.5000 mg | ORAL_CAPSULE | Freq: Every day | ORAL | Status: DC

## 2024-03-18 NOTE — Plan of Care (Signed)

## 2024-03-18 NOTE — Discharge Summary (Signed)
 Physician Discharge Summary  Joy Houston FMW:[id number] DOB: 1965/06/28 DOA: 03/17/2024  PCP: Glendia Shad, MD  Admit date: 03/17/2024 Discharge date: 03/18/2024  Admitted From: Home Disposition:  Home  Recommendations for Outpatient Follow-up:  Follow up with PCP in 1-2 weeks Ambulatory referral to gastroenterology  Home Health: No Equipment/Devices: None  Discharge Condition: Stable CODE STATUS: Full Diet recommendation: Soft/bland  Brief/Interim Summary: 59 y.o. 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  This morning patient wake up with severe lower abdominal pain.  Soon follow-up, she passed a large amount of bright red blood, but denied any tenesmus, no fever chills no nausea vomiting.      Discharge Diagnoses:  Principal Problem:   Lower GI bleed Active Problems:   Diverticulitis   Diverticulosis of intestine with bleeding  Hematochezia Acute diverticulitis Is evaluated by IR and GI.  No endoscopic intervention warranted.  Not a brisk arterial bleed per IR.  Patient has remained stable with normal range hemoglobin.  Seen by GI and case discussed with.  Cleared for discharge home.  Will be seen as outpatient by gastroenterology.  Will recommend short course of Augmentin  and advised on soft/bland diet and avoidance of constipation to minimize risk of rebleed.  Patient advised to return to emergency room if any episodes of bleeding occur in the future.  HTN Can resume home regimen     Discharge Instructions  Discharge Instructions     Diet - low sodium heart healthy   Complete by: As directed    Increase activity slowly   Complete  by: As directed       Allergies as of 03/18/2024       Reactions   Angiotensin Receptor Blockers Swelling   Angioedema Had past reaction of arb and angioedema with ace   Lisinopril  Swelling   Amlodipine  Besylate Itching   Gluten Meal Other (See Comments)   Other reaction(s): Other (See Comments)   Morphine  And Codeine Itching   Slight itching around nose area        Medication List     PAUSE taking these medications    praziquantel  600 MG tablet Wait to take this until your doctor or other care provider tells you to start again. Commonly known as: BILTRICIDE  Take 600 mg by mouth 3 (three) times daily.       STOP taking these medications    hydrocortisone  2.5 % cream   hydrocortisone  25 MG suppository Commonly known as: ANUSOL -HC   ketoconazole  2 % cream Commonly known as: NIZORAL        TAKE these medications    amoxicillin -clavulanate 875-125 MG tablet Commonly known as: AUGMENTIN  Take 1 tablet by mouth 2 (two) times daily for 6 days.   carvedilol  12.5 MG tablet Commonly known as: COREG  TAKE 1 TABLET (12.5MG  TOTAL) BY MOUTH TWICE A DAY WITH MEALS   EPINEPHrine  0.3 mg/0.3 mL Soaj injection Commonly known as: EpiPen  2-Pak Inject 0.3 mg into the muscle as needed for anaphylaxis.   hydrALAZINE  10 MG tablet Commonly known as: APRESOLINE  TAKE 1 TABLET (10 MG TOTAL) BY MOUTH 3 (THREE) TIMES DAILY. TO TAKE WITH THE 25 MG TABLETS   hydrALAZINE  25 MG tablet Commonly known as: APRESOLINE  Take 1 tablet (25  mg total) by mouth 3 (three) times daily. To take with the 10 mg tablets   Magnesium Bisglycinate Dihyd Powd Take 400 mg by mouth See admin instructions. Pt takes 2-4 times per week   NALTREXONE  HCL (PAIN) PO Take by mouth. What changed: how much to take   VITAMIN D  PO Take by mouth.        Allergies[1]  Consultations: GI   Procedures/Studies: CT ANGIO GI BLEED Result Date: 03/17/2024 EXAM: CTA ABDOMEN AND PELVIS WITH CONTRAST 03/17/2024  11:13:00 AM TECHNIQUE: CTA images of the abdomen and pelvis with 100 mL of iohexol  (OMNIPAQUE ) 350 MG/ML injection intravenous contrast. Three-dimensional MIP/volume rendered formations were performed. Automated exposure control, iterative reconstruction, and/or weight based adjustment of the mA/kV was utilized to reduce the radiation dose to as low as reasonably achievable. COMPARISON: 12/16/2005 CLINICAL HISTORY: Lower GI bleed, recently treated for Giardia, tapeworm, external hemorrhoids on exam. FINDINGS: VASCULATURE: GI BLEED: Subtle intraluminal hyperdensity at the descending/sigmoid junction on arterial and delayed phase images suggesting active extravasation. AORTA: Minimal aortic calcified plaque. No acute finding. No abdominal aortic aneurysm. No dissection. CELIAC TRUNK: No acute finding. No occlusion or significant stenosis. SUPERIOR MESENTERIC ARTERY: No acute finding. No occlusion or significant stenosis. INFERIOR MESENTERIC ARTERY: No acute finding. No occlusion or significant stenosis. RENAL ARTERIES: Single right renal artery, widely patent. 3 left renal arteries, superior dominant, all patent. The middle and inferior left renal arteries have aberrant origins from the aorta, below the level of the IMA. ILIAC ARTERIES: No acute finding. No occlusion or significant stenosis. ABDOMEN/PELVIS: LOWER CHEST: Visualized portion of the lower chest demonstrates no acute abnormality. LIVER: The liver is unremarkable. GALLBLADDER AND BILE DUCTS: Cholecystectomy clips are present, indicating prior cholecystectomy. No biliary ductal dilatation. SPLEEN: The spleen is unremarkable. PANCREAS: The pancreas is unremarkable. ADRENAL GLANDS: Bilateral adrenal glands demonstrate no acute abnormality. KIDNEYS, URETERS AND BLADDER: No stones in the kidneys or ureters. No hydronephrosis. No perinephric or periureteral stranding. Urinary bladder is unremarkable. GI AND BOWEL: Stomach and duodenal sweep demonstrate no acute  abnormality. There is no bowel obstruction. Multiple descending and sigmoid diverticula. Development of subtle intraluminal hyperdensity at the descending/sigmoid junction on arterial and delayed phase images suggesting active extravasation. There are some mild inflammatory/edematous changes at the descending/sigmoid junction suggesting diverticulitis. No abscess or perforation. REPRODUCTIVE: Reproductive organs are unremarkable. PERITONEUM AND RETROPERITONEUM: No ascites or free air. LYMPH NODES: No lymphadenopathy. BONES AND SOFT TISSUES: Mild degenerative disc disease at L4-L5 and S1. 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 No acute soft tissue abnormality. IMPRESSION: 1. Active extravasation at the descending/sigmoid junction, consistent with ongoing lower GI bleeding. 2. Diverticulosis with Mild inflammatory/edematous changes at the descending/sigmoid junction, consistent with diverticulitis. No abscess or free air. Electronically signed by: Dayne Hassell MD 03/17/2024 12:06 PM EST RP Workstation: [id number]      Subjective: Seen and examined on the day of discharge.  Stable no distress.  Appropriate discharge home.  Discharge Exam: Vitals:   03/18/24 0411 03/18/24 0748  BP: (!) 145/85 (!) 139/57  Pulse: 83 77  Resp: 17 18  Temp: 97.6 F (36.4 C) 97.7 F (36.5 C)  SpO2: 98% 97%   Vitals:   03/17/24 1537 03/17/24 2025 03/18/24 0411 03/18/24 0748  BP: (!) 148/88 (!) 151/83 (!) 145/85 (!) 139/57  Pulse: 77 79 83 77  Resp: 18 18 17 18   Temp: 98 F (36.7 C) 98.6 F (37 C) 97.6 F (36.4 C) 97.7 F (36.5 C)  TempSrc:   Oral Oral  SpO2: 97% 98% 98% 97%  Weight:      Height:        General: Pt is alert, awake, not in acute distress Cardiovascular: RRR, S1/S2 +, no rubs, no gallops Respiratory: CTA bilaterally, no wheezing, no rhonchi Abdominal: Soft, NT, ND, bowel sounds + Extremities: no edema, no cyanosis    The results of significant diagnostics  from this hospitalization (including imaging, microbiology, ancillary and laboratory) are listed below for reference.     Microbiology: No results found for this or any previous visit (from the past 240 hours).   Labs: BNP (last 3 results) No results for input(s): BNP in the last 8760 hours. Basic Metabolic Panel: Recent Labs  Lab 03/17/24 0942  NA 136  K 4.5  CL 100  CO2 23  GLUCOSE 147*  BUN 12  CREATININE 0.60  CALCIUM 9.2   Liver Function Tests: Recent Labs  Lab 03/17/24 0942  AST 46*  ALT 51*  ALKPHOS 79  BILITOT 0.5  PROT 7.4  ALBUMIN 4.3   Recent Labs  Lab 03/17/24 0942  LIPASE 23   No results for input(s): AMMONIA in the last 168 hours. CBC: Recent Labs  Lab 03/17/24 0942 03/17/24 1301 03/17/24 2013 03/18/24 0548  WBC 11.9*  --   --  9.4  NEUTROABS 9.1*  --   --   --   HGB 14.0 13.5 12.9 12.9  HCT 40.9 39.5 37.6 38.7  MCV 91.7  --   --  93.0  PLT 223  --   --  231   Cardiac Enzymes: No results for input(s): CKTOTAL, CKMB, CKMBINDEX, TROPONINI in the last 168 hours. BNP: Invalid input(s): POCBNP CBG: No results for input(s): GLUCAP in the last 168 hours. D-Dimer No results for input(s): DDIMER in the last 72 hours. Hgb A1c No results for input(s): HGBA1C in the last 72 hours. Lipid Profile No results for input(s): CHOL, HDL, LDLCALC, TRIG, CHOLHDL, LDLDIRECT in the last 72 hours. Thyroid  function studies No results for input(s): TSH, T4TOTAL, T3FREE, THYROIDAB in the last 72 hours.  Invalid input(s): FREET3 Anemia work up No results for input(s): VITAMINB12, FOLATE, FERRITIN, TIBC, IRON, RETICCTPCT in the last 72 hours. Urinalysis    Component Value Date/Time   COLORURINE YELLOW (A) 03/17/2024 0942   APPEARANCEUR CLEAR (A) 03/17/2024 0942   LABSPEC 1.021 03/17/2024 0942   PHURINE 5.0 03/17/2024 0942   GLUCOSEU NEGATIVE 03/17/2024 0942   GLUCOSEU NEGATIVE 05/07/2021 0811   HGBUR  SMALL (A) 03/17/2024 0942   BILIRUBINUR NEGATIVE 03/17/2024 0942   KETONESUR NEGATIVE 03/17/2024 0942   PROTEINUR NEGATIVE 03/17/2024 0942   UROBILINOGEN 0.2 05/07/2021 0811   NITRITE NEGATIVE 03/17/2024 0942   LEUKOCYTESUR NEGATIVE 03/17/2024 0942   Sepsis Labs Recent Labs  Lab 03/17/24 0942 03/18/24 0548  WBC 11.9* 9.4   Microbiology No results found for this or any previous visit (from the past 240 hours).   Time coordinating discharge: 40 minutes  SIGNED:   Calvin KATHEE Robson, MD  [facility] 03/18/2024, 12:59 PM Pager   If 7PM-7AM, please contact night-coverage     [1]  Allergies Allergen Reactions   Angiotensin Receptor Blockers Swelling    Angioedema Had past reaction of arb and angioedema with ace   Lisinopril  Swelling   Amlodipine  Besylate Itching   Gluten Meal Other (See Comments)    Other reaction(s): Other (See Comments)   Morphine  And Codeine Itching    Slight itching around nose area

## 2024-03-18 NOTE — Consult Note (Signed)
 "      Joy Copping, MD [place] [facility]  [address] [place], [place] [address] Phone: [phone] Fax : [phone]  Consultation  Referring Provider:     Dr. Nicholaus Primary Care Physician:  Glendia Shad, MD Primary Gastroenterologist: [facility]         Reason for Consultation:     Bright red blood per rectum and diverticulitis  Date of Admission:  03/17/2024 Date of Consultation:  03/18/2024         HPI:   Joy Houston is a 59 y.o. female with a history of chronic diarrhea and hypertension, A-fib and obstructive sleep apnea who presented to the emergency department with a report of bright red blood per rectum for on day of admission.  The patient had reported that her diarrhea had worsened over the last few weeks and she was treated for Giardia and a tapeworm as reported to the ED.  Prior to admission the patient reports that she had abdominal discomfort.  The patient's last colonoscopy was at [facility] in 2022 and showed 4 polyps being removed and the recommended of a repeat colonoscopy in 3 years.  Upon admission to the emergency department the patient underwent a CT angiography that showed:  IMPRESSION: 1. Active extravasation at the descending/sigmoid junction, consistent with ongoing lower GI bleeding. 2. Diverticulosis with Mild inflammatory/edematous changes at the descending/sigmoid junction, consistent with diverticulitis. No abscess or free air. The patients Hb and Hct have shown:  Component     Latest Ref Rng 03/30/2023 03/17/2024 03/18/2024  Hemoglobin     12.0 - 15.0 g/dL 85.4  87.0  87.0   Hemoglobin       13.5    Hemoglobin       14.0    HCT     36.0 - 46.0 % 42.8  37.6  38.7   HCT       39.5    HCT       40.9     The patient has had no further signs of any GI bleeding today.  The patient is also concerned because she has had longstanding diarrhea and it started after having her gallbladder out.  The patient has also had slightly elevated ALT for some time.  Past  Medical History:  Diagnosis Date   Allergy    Anemia    Angio-edema    Arthritis    Atrial fibrillation (HCC)    Complication of anesthesia    Diabetes mellitus without complication (HCC)    diet controlled   Diverticulosis    Dysplastic nevus 06/27/2018   Left upper back. Moderate atypia, limited margins free.    Eczema    Epstein Barr infection    Family history of anesthesia complication    Mother - confusion   Fatty liver    Heart murmur    Slight - nothing to worry about   Hemorrhoid    Hypertension    Hypothyroidism    Neuromuscular disorder (HCC)    PONV (postoperative nausea and vomiting)    only with c-sections   Sleep apnea    mild    Past Surgical History:  Procedure Laterality Date   CESAREAN SECTION     CHOLECYSTECTOMY N/A 09/07/2018   Procedure: LAPAROSCOPIC CHOLECYSTECTOMY;  Surgeon: Rodolph Romano, MD;  Location: [facility] ORS;  Service: General;  Laterality: N/A;   FINGER SURGERY     pin to 3rd finger Right hand   LUMBAR LAMINECTOMY/DECOMPRESSION MICRODISCECTOMY  12/15/2011   Procedure: LUMBAR  LAMINECTOMY/DECOMPRESSION MICRODISCECTOMY 1 LEVEL;  Surgeon: Reyes JONETTA Budge, MD;  Location: [facility];  Service: Neurosurgery;  Laterality: Right;  RIGHT Lumbar four-Five diskectomy   LUMBAR LAMINECTOMY/DECOMPRESSION MICRODISCECTOMY N/A 11/04/2019   Procedure: MICRODISCECTOMY LEFT LUMBAR FIVE SACRAL ONE;  Surgeon: Budge Reyes, MD;  Location: [facility] OR;  Service: Neurosurgery;  Laterality: N/A;  3C   MICRODISCECTOMY LUMBAR     L4-5   SPINE SURGERY     WISDOM TOOTH EXTRACTION      Prior to Admission medications  Medication Sig Start Date End Date Taking? Authorizing Provider  carvedilol  (COREG ) 12.5 MG tablet TAKE 1 TABLET (12.5MG  TOTAL) BY MOUTH TWICE A DAY WITH MEALS 02/16/24  Yes Glendia Shad, MD  hydrALAZINE  (APRESOLINE ) 10 MG tablet TAKE 1 TABLET (10 MG TOTAL) BY MOUTH 3 (THREE) TIMES DAILY. TO TAKE WITH THE 25 MG TABLETS 02/08/24  Yes  Raford Riggs, MD  hydrALAZINE  (APRESOLINE ) 25 MG tablet Take 1 tablet (25 mg total) by mouth 3 (three) times daily. To take with the 10 mg tablets 02/08/24  Yes Raford Riggs, MD  Magnesium Bisglycinate Dihyd POWD Take 400 mg by mouth See admin instructions. Pt takes 2-4 times per week   Yes [provider]  NALTREXONE  HCL, PAIN, PO Take by mouth. Patient taking differently: Take 0.5 mg by mouth.   Yes [provider]  praziquantel  (BILTRICIDE ) 600 MG tablet Take 600 mg by mouth 3 (three) times daily. 03/15/24  Yes [provider]  VITAMIN D  PO Take by mouth.   Yes [provider]  EPINEPHrine  (EPIPEN  2-PAK) 0.3 mg/0.3 mL IJ SOAJ injection Inject 0.3 mg into the muscle as needed for anaphylaxis. 01/10/23   Glendia Shad, MD  hydrocortisone  (ANUSOL -HC) 25 MG suppository PLACE 1 SUPPOSITORY RECTALLY 2 (TWO) TIMES DAILY AS NEEDED FOR HEMORRHOIDS OR ANAL ITCHING. Patient not taking: Reported on 03/17/2024 10/25/23   Glendia Shad, MD  hydrocortisone  2.5 % cream Apply twice a day for up to 2 weeks as needed for rash. Patient not taking: Reported on 03/17/2024 02/05/24   Jackquline Sawyer, MD  ketoconazole  (NIZORAL ) 2 % cream Apply twice a day as needed for rash. Patient not taking: Reported on 03/17/2024 02/05/24   Jackquline Sawyer, MD    Family History  Problem Relation Age of Onset   Hypertension Mother    GER disease Mother    Hyperlipidemia Mother    Vision loss Mother    Heart failure Father    Hypertension Father    Diabetes Father    Cancer Father        Melanoma, Prostate   Heart disease Father    Early death Father    Breast cancer Sister 25   Arthritis Sister    Cancer Sister    Stroke Maternal Grandmother    Heart attack Maternal Grandfather    Heart disease Maternal Grandfather    Diabetes Paternal Grandfather      Social History[1]  Allergies as of 03/17/2024 - Review Complete 03/17/2024  Allergen Reaction Noted   Angiotensin  receptor blockers Swelling 04/16/2018   Lisinopril  Swelling 04/14/2018   Amlodipine  besylate Itching 06/30/2021   Gluten meal Other (See Comments) 09/09/2020   Morphine  and codeine Itching 12/15/2011    Review of Systems:    All systems reviewed and negative except where noted in HPI.   Physical Exam:  Vital signs in last 24 hours: Temp:  [97.6 F (36.4 C)-98.7 F (37.1 C)] 97.7 F (36.5 C) (01/26 0748) Pulse Rate:  [77-91] 77 (  01/26 0748) Resp:  [17-20] 18 (01/26 0748) BP: (139-167)/(57-93) 139/57 (01/26 0748) SpO2:  [95 %-98 %] 97 % (01/26 0748) Weight:  [118.4 kg] 118.4 kg (01/25 0920) Last BM Date : 03/18/24 General:   Pleasant, cooperative in NAD Head:  Normocephalic and atraumatic. Eyes:   No icterus.   Conjunctiva pink. PERRLA. Ears:  Normal auditory acuity. Neck:  Supple; no masses or thyroidomegaly Lungs: Respirations even and unlabored. Lungs clear to auscultation bilaterally.   No wheezes, crackles, or rhonchi.  Heart:  Regular rate and rhythm;  Without murmur, clicks, rubs or gallops Abdomen:  Soft, nondistended, nontender. Normal bowel sounds. No appreciable masses or hepatomegaly.  No rebound or guarding.  Rectal:  Not performed. Msk:  Symmetrical without gross deformities.  Extremities:  Without edema, cyanosis or clubbing. Neurologic:  Alert and oriented x3;  grossly normal neurologically. Skin:  Intact without significant lesions or rashes. Cervical Nodes:  No significant cervical adenopathy. Psych:  Alert and cooperative. Normal affect.  LAB RESULTS: Recent Labs    03/17/24 0942 03/17/24 1301 03/17/24 2013 03/18/24 0548  WBC 11.9*  --   --  9.4  HGB 14.0 13.5 12.9 12.9  HCT 40.9 39.5 37.6 38.7  PLT 223  --   --  231   BMET Recent Labs    03/17/24 0942  NA 136  K 4.5  CL 100  CO2 23  GLUCOSE 147*  BUN 12  CREATININE 0.60  CALCIUM 9.2   LFT Recent Labs    03/17/24 0942  PROT 7.4  ALBUMIN 4.3  AST 46*  ALT 51*  ALKPHOS 79  BILITOT  0.5   PT/INR No results for input(s): LABPROT, INR in the last 72 hours.  STUDIES: CT ANGIO GI BLEED Result Date: 03/17/2024 EXAM: CTA ABDOMEN AND PELVIS WITH CONTRAST 03/17/2024 11:13:00 AM TECHNIQUE: CTA images of the abdomen and pelvis with 100 mL of iohexol  (OMNIPAQUE ) 350 MG/ML injection intravenous contrast. Three-dimensional MIP/volume rendered formations were performed. Automated exposure control, iterative reconstruction, and/or weight based adjustment of the mA/kV was utilized to reduce the radiation dose to as low as reasonably achievable. COMPARISON: 12/16/2005 CLINICAL HISTORY: Lower GI bleed, recently treated for Giardia, tapeworm, external hemorrhoids on exam. FINDINGS: VASCULATURE: GI BLEED: Subtle intraluminal hyperdensity at the descending/sigmoid junction on arterial and delayed phase images suggesting active extravasation. AORTA: Minimal aortic calcified plaque. No acute finding. No abdominal aortic aneurysm. No dissection. CELIAC TRUNK: No acute finding. No occlusion or significant stenosis. SUPERIOR MESENTERIC ARTERY: No acute finding. No occlusion or significant stenosis. INFERIOR MESENTERIC ARTERY: No acute finding. No occlusion or significant stenosis. RENAL ARTERIES: Single right renal artery, widely patent. 3 left renal arteries, superior dominant, all patent. The middle and inferior left renal arteries have aberrant origins from the aorta, below the level of the IMA. ILIAC ARTERIES: No acute finding. No occlusion or significant stenosis. ABDOMEN/PELVIS: LOWER CHEST: Visualized portion of the lower chest demonstrates no acute abnormality. LIVER: The liver is unremarkable. GALLBLADDER AND BILE DUCTS: Cholecystectomy clips are present, indicating prior cholecystectomy. No biliary ductal dilatation. SPLEEN: The spleen is unremarkable. PANCREAS: The pancreas is unremarkable. ADRENAL GLANDS: Bilateral adrenal glands demonstrate no acute abnormality. KIDNEYS, URETERS AND BLADDER: No  stones in the kidneys or ureters. No hydronephrosis. No perinephric or periureteral stranding. Urinary bladder is unremarkable. GI AND BOWEL: Stomach and duodenal sweep demonstrate no acute abnormality. There is no bowel obstruction. Multiple descending and sigmoid diverticula. Development of subtle intraluminal hyperdensity at the descending/sigmoid junction on arterial and delayed phase  images suggesting active extravasation. There are some mild inflammatory/edematous changes at the descending/sigmoid junction suggesting diverticulitis. No abscess or perforation. REPRODUCTIVE: Reproductive organs are unremarkable. PERITONEUM AND RETROPERITONEUM: No ascites or free air. LYMPH NODES: No lymphadenopathy. BONES AND SOFT TISSUES: Mild degenerative disc disease at L4-L5 and S1. 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 No acute soft tissue abnormality. IMPRESSION: 1. Active extravasation at the descending/sigmoid junction, consistent with ongoing lower GI bleeding. 2. Diverticulosis with Mild inflammatory/edematous changes at the descending/sigmoid junction, consistent with diverticulitis. No abscess or free air. Electronically signed by: Katheleen Faes MD 03/17/2024 12:06 PM EST RP Workstation: [id number]      Impression / Plan:   Assessment: Principal Problem:   Lower GI bleed Active Problems:   Diverticulitis   Diverticulosis of intestine with bleeding   Joy Houston is a 59 y.o. y/o female with who comes in with bright red blood per rectum and diverticulitis.  The patient was sent for intervention radiology and was found to have a active GI bleed at the sigmoid descending colon junction consistent with a diverticular bleed.  The patient was colonoscopy was 3 years ago and it was recommended that the patient have a repeat colonoscopy in 3 years after 4 polyps were found at that time.  Plan:  The patient has a contraindication to any endoluminal evaluation now due to the  patient's diverticulitis.  The patient has been told that she should wait 6 weeks prior to having a colonoscopy but she should be set up for a colonoscopy as an outpatient.  The patient also has been told that she can follow-up as an outpatient for her abnormal liver enzymes.  The patient's hemoglobin has been stable and she has been told that although the diverticular bleeding can recur there is no telling when it may or may not recur and that she is feeling well and doing fine that the hospitalist can consider discharge from a GI point of view.  The patient has been explained the plan and agrees with it.  Thank you for involving me in the care of this patient.      LOS: 1 day   Joy Copping, MD, MD. NOLIA 03/18/2024, 8:29 AM,  Pager [phone] 7am-5pm  Check AMION for 5pm -7am coverage and on weekends   Note: This dictation was prepared with Dragon dictation along with smaller phrase technology. Any transcriptional errors that result from this process are unintentional.       [1]  Social History Tobacco Use   Smoking status: Never    Passive exposure: Never   Smokeless tobacco: Never  Vaping Use   Vaping status: Never Used  Substance Use Topics   Alcohol use: Not Currently    Alcohol/week: 0.0 standard drinks of alcohol   Drug use: No   "

## 2024-03-19 ENCOUNTER — Telehealth: Payer: Self-pay | Admitting: *Deleted

## 2024-03-19 NOTE — Patient Instructions (Addendum)
 Visit Information  Thank you for taking time to visit with me today. Please don't hesitate to contact your primary care provider with any additional inquires or questions.  Patient Instructions: -Report signs of re-bleeding, medication-related complications and when to contact her provider or seek emergency care -Will attend and schedule appointments (primary care provider 7-14 days and gastroenterology) -Will follow up with all providers to scheduled post hospital appointments -Report symptoms of dizziness, and syncope  -Follow the recommended prescribed dietary for low sodium heart healthy diet  Will also attach to Lower GI bleeding What to know to AVS.   Care plan and visit instructions communicated with the patient verbally today. Patient agrees to receive a copy in MyChart. Active MyChart status and patient understanding of how to access instructions and care plan via MyChart confirmed with patient.     No further follow up required: Patient opt to declined TOC program and services at this time.  Please call the care guide team at [phone] if you need to cancel or reschedule your appointment.   Please call the Suicide and Crisis Lifeline: 988 call the [place]  National Suicide Prevention Lifeline: 585-747-9794 or TTY: 720-407-3922 TTY (229) 854-4513) to talk to a trained counselor call 1-800-273-TALK (toll free, 24 hour hotline) if you are experiencing a Mental Health or Behavioral Health Crisis or need someone to talk to.  Olam Ku, RN, BSN [place]  [facility], [facility] RN Care Manager Direct Dial : [phone]  Fax: [phone]

## 2024-03-19 NOTE — Transitions of Care (Post Inpatient/ED Visit) (Signed)
" ° °  03/19/2024  Name: Joy Houston MRN: [id number] DOB: [birth year]  Today's TOC FU Call Status: Today's TOC FU Call Status:: Unsuccessful Call (1st Attempt) Unsuccessful Call (1st Attempt) Date: 03/19/24  Attempted to reach the patient regarding the most recent Inpatient/ED visit.  Follow Up Plan: Additional outreach attempts will be made to reach the patient to complete the Transitions of Care (Post Inpatient/ED visit) call.    Olam Ku, RN, BSN [place]  [facility], [facility] RN Care Manager Direct Dial : [phone]  Fax: [phone]   "

## 2024-03-19 NOTE — Transitions of Care (Post Inpatient/ED Visit) (Signed)
" ° °  03/19/2024  Name: Joy Houston MRN: [id number] DOB: 04/29/1965  Today's TOC FU Call Status: Today's TOC FU Call Status:: Successful TOC FU Call Completed TOC FU Call Complete Date: 03/19/24  Patient's Name and Date of Birth confirmed. DOB, Name  Transition Care Management Follow-up Telephone Call Date of Discharge: 03/18/24 Discharge Facility: [facility]) Type of Discharge: Inpatient Admission Primary Inpatient Discharge Diagnosis:: Lower GI Bleed How have you been since you were released from the hospital?: Better Any questions or concerns?: No  Items Reviewed: Did you receive and understand the discharge instructions provided?: Yes Medications obtained,verified, and reconciled?: Yes (Medications Reviewed) Any new allergies since your discharge?: No Dietary orders reviewed?: Yes Type of Diet Ordered:: low sodium heart healthy Do you have support at home?: Yes People in Home [RPT]: spouse Name of Support/Comfort Primary Source: Cathlyn Pollock  Medications Reviewed Today: Medications Reviewed Today   Medications were not reviewed in this encounter     Home Care and Equipment/Supplies: Were Home Health Services Ordered?: No Any new equipment or medical supplies ordered?: No  Functional Questionnaire: Do you need assistance with bathing/showering or dressing?: No Do you need assistance with meal preparation?: No Do you need assistance with eating?: No Do you have difficulty maintaining continence: No Do you need assistance with getting out of bed/getting out of a chair/moving?: No Do you have difficulty managing or taking your medications?: No  Follow up appointments reviewed: PCP Follow-up appointment confirmed?: No (Patient has an appointment scheduled on 2/17 prior to her hospitalization. Patient indicates she will contact her provider today and reschedule for a hospital follow up appointment.) MD Provider Line Number:[phone] Given:  No Specialist Hospital Follow-up appointment confirmed?: No Reason Specialist Follow-Up Not Confirmed: Patient has Specialist Provider Number and will Call for Appointment (Patient reports she will contact her GI specialist Dr. Theophilus today and scheduled an appointment) Do you need transportation to your follow-up appointment?: No Do you understand care options if your condition(s) worsen?: Yes-patient verbalized understanding  SDOH Interventions Today    Flowsheet Row Most Recent Value  SDOH Interventions   Food Insecurity Interventions Intervention Not Indicated  Housing Interventions Intervention Not Indicated  Transportation Interventions Intervention Not Indicated  Utilities Interventions Intervention Not Indicated   Discussed and offered 30 day TOC program.  Patient opt to decline TOC program at this time .  The patient has been provided with contact information for the care management team and has been advised to call with any health -related questions or concerns.  The patient verbalized understanding with current plan of care.  The patient is directed to their insurance card regarding availability of benefits coverage.    Olam Ku, RN, BSN [place]  [facility], [facility] RN Care Manager Direct Dial : [phone]  Fax: [phone]   "

## 2024-03-20 ENCOUNTER — Ambulatory Visit: Payer: Self-pay | Admitting: Internal Medicine

## 2024-03-20 LAB — MISC LABCORP TEST (SEND OUT): Labcorp test code: 83965

## 2024-03-20 LAB — HIV ANTIBODY (ROUTINE TESTING W REFLEX): HIV Screen 4th Generation wRfx: NONREACTIVE

## 2024-03-21 ENCOUNTER — Encounter: Payer: Self-pay | Admitting: Internal Medicine

## 2024-03-21 ENCOUNTER — Ambulatory Visit: Admitting: Internal Medicine

## 2024-03-21 ENCOUNTER — Other Ambulatory Visit: Payer: Self-pay

## 2024-03-21 ENCOUNTER — Emergency Department

## 2024-03-21 ENCOUNTER — Ambulatory Visit: Payer: Self-pay | Admitting: Internal Medicine

## 2024-03-21 ENCOUNTER — Emergency Department
Admission: EM | Admit: 2024-03-21 | Discharge: 2024-03-21 | Disposition: A | Discharge status: Home / Self Care | Attending: Emergency Medicine | Admitting: Emergency Medicine

## 2024-03-21 VITALS — BP 148/88 | HR 93 | Temp 98.2°F | Ht 66.0 in | Wt 260.0 lb

## 2024-03-21 DIAGNOSIS — R002 Palpitations: Secondary | ICD-10-CM | POA: Insufficient documentation

## 2024-03-21 DIAGNOSIS — R000 Tachycardia, unspecified: Secondary | ICD-10-CM

## 2024-03-21 DIAGNOSIS — I480 Paroxysmal atrial fibrillation: Secondary | ICD-10-CM | POA: Diagnosis not present

## 2024-03-21 DIAGNOSIS — I10 Essential (primary) hypertension: Secondary | ICD-10-CM | POA: Diagnosis not present

## 2024-03-21 DIAGNOSIS — E1165 Type 2 diabetes mellitus with hyperglycemia: Secondary | ICD-10-CM

## 2024-03-21 DIAGNOSIS — R0789 Other chest pain: Secondary | ICD-10-CM | POA: Insufficient documentation

## 2024-03-21 DIAGNOSIS — R079 Chest pain, unspecified: Secondary | ICD-10-CM | POA: Diagnosis not present

## 2024-03-21 DIAGNOSIS — D72829 Elevated white blood cell count, unspecified: Secondary | ICD-10-CM | POA: Insufficient documentation

## 2024-03-21 DIAGNOSIS — R945 Abnormal results of liver function studies: Secondary | ICD-10-CM | POA: Diagnosis not present

## 2024-03-21 DIAGNOSIS — K5792 Diverticulitis of intestine, part unspecified, without perforation or abscess without bleeding: Secondary | ICD-10-CM

## 2024-03-21 DIAGNOSIS — R1013 Epigastric pain: Secondary | ICD-10-CM | POA: Insufficient documentation

## 2024-03-21 DIAGNOSIS — R42 Dizziness and giddiness: Secondary | ICD-10-CM

## 2024-03-21 LAB — URINALYSIS, ROUTINE W REFLEX MICROSCOPIC
Bilirubin Urine: NEGATIVE
Glucose, UA: NEGATIVE mg/dL
Hgb urine dipstick: NEGATIVE
Ketones, ur: NEGATIVE mg/dL
Nitrite: NEGATIVE
Protein, ur: NEGATIVE mg/dL
Specific Gravity, Urine: 1.008 (ref 1.005–1.030)
pH: 5 (ref 5.0–8.0)

## 2024-03-21 LAB — BASIC METABOLIC PANEL WITH GFR
Anion gap: 13 (ref 5–15)
BUN: 10 mg/dL (ref 6–20)
CO2: 25 mmol/L (ref 22–32)
Calcium: 9.6 mg/dL (ref 8.9–10.3)
Chloride: 103 mmol/L (ref 98–111)
Creatinine, Ser: 0.68 mg/dL (ref 0.44–1.00)
GFR, Estimated: >60 mL/min
Glucose, Bld: 124 mg/dL — ABNORMAL HIGH (ref 70–99)
Potassium: 4.1 mmol/L (ref 3.5–5.1)
Sodium: 141 mmol/L (ref 135–145)

## 2024-03-21 LAB — CBC
HCT: 45.4 % (ref 36.0–46.0)
Hemoglobin: 15 g/dL (ref 12.0–15.0)
MCH: 30.7 pg (ref 26.0–34.0)
MCHC: 33 g/dL (ref 30.0–36.0)
MCV: 92.8 fL (ref 80.0–100.0)
Platelets: 308 10*3/uL (ref 150–400)
RBC: 4.89 MIL/uL (ref 3.87–5.11)
RDW: 13.2 % (ref 11.5–15.5)
WBC: 10.9 10*3/uL — ABNORMAL HIGH (ref 4.0–10.5)
nRBC: 0 % (ref 0.0–0.2)

## 2024-03-21 LAB — TROPONIN T, HIGH SENSITIVITY
Troponin T High Sensitivity: 8 ng/L (ref 0–19)
Troponin T High Sensitivity: 9 ng/L (ref 0–19)

## 2024-03-21 MED ORDER — FLUCONAZOLE 150 MG PO TABS: 150.0000 mg | ORAL_TABLET | Freq: Once | ORAL | 0 refills | Status: AC

## 2024-03-21 NOTE — Assessment & Plan Note (Signed)
 Intermittent episodes of light headedness. Blood pressure as outlined. Decreased appetite. Decreased po intake. Follow.

## 2024-03-21 NOTE — Assessment & Plan Note (Signed)
 S/p ablation.  Followed by cardiology. In SR today. Had episode of increased heart rate prior to coming to appt. EKG as outlined. SR now. Follow. Continue carvedilol .

## 2024-03-21 NOTE — Assessment & Plan Note (Signed)
 Recent hospitalization as outlined. On augmentin . Lower abdominal discomfort improved.

## 2024-03-21 NOTE — Progress Notes (Signed)
 "  Subjective:    Patient ID: Joy Houston, female    DOB: [birth year], 59 y.o.   MRN: [id number]  Patient here for  Chief Complaint  Patient presents with   Hospitalization Follow-up    HPI Here for hospital follow up - admitted 03/17/24 - 03/18/24 - after presenting with rectal bleeding. Woke day of admission with increased abdominal pain. Passed a large amount of BRB. Was evaluated by IR and GI. No endoscopic intervention warranted. Evaluated by GI. Recommended outpatient follow up. Hgb remained stable. Recommended short course of augmentin  and advised soft/bland diet and avoidance of constipation.  She reports no further bleeding since discharge. She does report she had an episode of epigastric/lower chest pain last night. Was lying in bed. Also had an episode of chest/epigastric discomfort this am - woke her from sleep. Had another episode later this am and brief episode here in the office. Breathing overall stable. Abdominal discomfort has improved some. No vomiting. She has been eating bland foods and decreased po intake. Occasional light headedness.    Past Medical History:  Diagnosis Date   Allergy    Anemia    Angio-edema    Arthritis    Atrial fibrillation (HCC)    Complication of anesthesia    Diabetes mellitus without complication (HCC)    diet controlled   Diverticulosis    Dysplastic nevus 06/27/2018   Left upper back. Moderate atypia, limited margins free.    Eczema    Epstein Barr infection    Family history of anesthesia complication    Mother - confusion   Fatty liver    Heart murmur    Slight - nothing to worry about   Hemorrhoid    Hypertension    Hypothyroidism    Neuromuscular disorder (HCC)    PONV (postoperative nausea and vomiting)    only with c-sections   Sleep apnea    mild   Past Surgical History:  Procedure Laterality Date   CESAREAN SECTION     CHOLECYSTECTOMY N/A 09/07/2018   Procedure: LAPAROSCOPIC CHOLECYSTECTOMY;  Surgeon:  Rodolph Romano, MD;  Location: [facility] ORS;  Service: General;  Laterality: N/A;   FINGER SURGERY     pin to 3rd finger Right hand   LUMBAR LAMINECTOMY/DECOMPRESSION MICRODISCECTOMY  12/15/2011   Procedure: LUMBAR LAMINECTOMY/DECOMPRESSION MICRODISCECTOMY 1 LEVEL;  Surgeon: Reyes JONETTA Budge, MD;  Location: [facility];  Service: Neurosurgery;  Laterality: Right;  RIGHT Lumbar four-Five diskectomy   LUMBAR LAMINECTOMY/DECOMPRESSION MICRODISCECTOMY N/A 11/04/2019   Procedure: MICRODISCECTOMY LEFT LUMBAR FIVE SACRAL ONE;  Surgeon: Budge Reyes, MD;  Location: [facility] OR;  Service: Neurosurgery;  Laterality: N/A;  3C   MICRODISCECTOMY LUMBAR     L4-5   SPINE SURGERY     WISDOM TOOTH EXTRACTION     Family History  Problem Relation Age of Onset   Hypertension Mother    GER disease Mother    Hyperlipidemia Mother    Vision loss Mother    Heart failure Father    Hypertension Father    Diabetes Father    Cancer Father        Melanoma, Prostate   Heart disease Father    Early death Father    Breast cancer Sister 63   Arthritis Sister    Cancer Sister    Stroke Maternal Grandmother    Heart attack Maternal Grandfather    Heart disease Maternal Grandfather    Diabetes Paternal Grandfather    Social History   Socioeconomic History  Marital status: Married    Spouse name: Not on file   Number of children: 2   Years of education: Not on file   Highest education level: Bachelor's degree (e.g., BA, AB, BS)  Occupational History    Employer: [employer]  Tobacco Use   Smoking status: Never    Passive exposure: Never   Smokeless tobacco: Never  Vaping Use   Vaping status: Never Used  Substance and Sexual Activity   Alcohol use: Not Currently    Alcohol/week: 0.0 standard drinks of alcohol   Drug use: No   Sexual activity: Yes    Partners: Male    Comment: married  Other Topics Concern   Not on file  Social History Narrative   Nurse    Married   Social Drivers of  Health   Tobacco Use: Low Risk (03/21/2024)   Patient History    Smoking Tobacco Use: Never    Smokeless Tobacco Use: Never    Passive Exposure: Never  Financial Resource Strain: Low Risk (04/24/2023)   Overall Financial Resource Strain (CARDIA)    Difficulty of Paying Living Expenses: Not hard at all  Food Insecurity: No Food Insecurity (03/19/2024)   Epic    Worried About Radiation Protection Practitioner of Food in the Last Year: Never true    Ran Out of Food in the Last Year: Never true  Transportation Needs: No Transportation Needs (03/19/2024)   Epic    Lack of Transportation (Medical): No    Lack of Transportation (Non-Medical): No  Physical Activity: Unknown (04/24/2023)   Exercise Vital Sign    Days of Exercise per Week: Patient declined    Minutes of Exercise per Session: Not on file  Stress: Patient Declined (04/24/2023)   [employer] of Occupational Health - Occupational Stress Questionnaire    Feeling of Stress : Patient declined  Social Connections: Socially Integrated (04/24/2023)   Social Connection and Isolation Panel    Frequency of Communication with Friends and Family: More than three times a week    Frequency of Social Gatherings with Friends and Family: More than three times a week    Attends Religious Services: More than 4 times per year    Active Member of Clubs or Organizations: Yes    Attends Banker Meetings: More than 4 times per year    Marital Status: Married  Depression (PHQ2-9): Low Risk (03/19/2024)   Depression (PHQ2-9)    PHQ-2 Score: 0  Alcohol Screen: Not on file  Housing: Low Risk (03/19/2024)   Epic    Unable to Pay for Housing in the Last Year: No    Number of Times Moved in the Last Year: 0    Homeless in the Last Year: No  Utilities: Not At Risk (03/19/2024)   Epic    Threatened with loss of utilities: No  Health Literacy: Not on file     Review of Systems  Constitutional:  Negative for unexpected weight change.       Decreased appetite.    HENT:  Negative for congestion and sinus pressure.   Respiratory:  Positive for chest tightness. Negative for cough.        Breathing overall stable.   Cardiovascular:  Negative for palpitations and leg swelling.  Gastrointestinal:  Negative for nausea and vomiting.       Abdominal pain - improved.   Genitourinary:  Negative for difficulty urinating and dysuria.  Musculoskeletal:  Negative for joint swelling and myalgias.  Skin:  Negative for  color change and rash.  Neurological:        Occasional light headedness.   Psychiatric/Behavioral:  Negative for agitation and dysphoric mood.        Objective:     BP (!) 148/88   Pulse 93   Temp 98.2 F (36.8 C) (Oral)   Ht 5' 6 (1.676 m)   Wt 260 lb (117.9 kg)   SpO2 96%   BMI 41.97 kg/m  Wt Readings from Last 3 Encounters:  03/21/24 260 lb (117.9 kg)  03/21/24 260 lb (117.9 kg)  03/19/24 258 lb (117 kg)    Physical Exam Vitals reviewed.  Constitutional:      General: She is not in acute distress.    Appearance: Normal appearance.  HENT:     Head: Normocephalic and atraumatic.     Right Ear: External ear normal.     Left Ear: External ear normal.     Mouth/Throat:     Pharynx: No oropharyngeal exudate or posterior oropharyngeal erythema.  Eyes:     General: No scleral icterus.       Right eye: No discharge.        Left eye: No discharge.     Conjunctiva/sclera: Conjunctivae normal.  Neck:     Thyroid : No thyromegaly.  Cardiovascular:     Rate and Rhythm: Normal rate and regular rhythm.  Pulmonary:     Effort: No respiratory distress.     Breath sounds: Normal breath sounds. No wheezing.  Abdominal:     General: Bowel sounds are normal.     Palpations: Abdomen is soft.     Tenderness: There is no abdominal tenderness.  Musculoskeletal:        General: No swelling or tenderness.     Cervical back: Neck supple. No tenderness.  Lymphadenopathy:     Cervical: No cervical adenopathy.  Skin:    Findings: No  erythema or rash.  Neurological:     Mental Status: She is alert.  Psychiatric:        Mood and Affect: Mood normal.        Behavior: Behavior normal.         Outpatient Encounter Medications as of 03/21/2024  Medication Sig   amoxicillin -clavulanate (AUGMENTIN ) 875-125 MG tablet Take 1 tablet by mouth 2 (two) times daily for 6 days.   carvedilol  (COREG ) 12.5 MG tablet TAKE 1 TABLET (12.5MG  TOTAL) BY MOUTH TWICE A DAY WITH MEALS   EPINEPHrine  (EPIPEN  2-PAK) 0.3 mg/0.3 mL IJ SOAJ injection Inject 0.3 mg into the muscle as needed for anaphylaxis.   hydrALAZINE  (APRESOLINE ) 10 MG tablet TAKE 1 TABLET (10 MG TOTAL) BY MOUTH 3 (THREE) TIMES DAILY. TO TAKE WITH THE 25 MG TABLETS   hydrALAZINE  (APRESOLINE ) 25 MG tablet Take 1 tablet (25 mg total) by mouth 3 (three) times daily. To take with the 10 mg tablets   Magnesium Bisglycinate Dihyd POWD Take 400 mg by mouth See admin instructions. Pt takes 2-4 times per week   NALTREXONE  HCL, PAIN, PO Take by mouth. (Patient taking differently: Take 0.5 mg by mouth.)   [Paused] praziquantel  (BILTRICIDE ) 600 MG tablet Take 600 mg by mouth 3 (three) times daily.   VITAMIN D  PO Take by mouth.   No facility-administered encounter medications on file as of 03/21/2024.     Lab Results  Component Value Date   WBC 10.9 (H) 03/21/2024   HGB 15.0 03/21/2024   HCT 45.4 03/21/2024   PLT 308 03/21/2024   GLUCOSE 147 (  H) 03/17/2024   CHOL 190 12/12/2023   TRIG 121.0 12/12/2023   HDL 56.50 12/12/2023   LDLDIRECT 145.4 03/01/2013   LDLCALC 109 (H) 12/12/2023   ALT 51 (H) 03/17/2024   AST 46 (H) 03/17/2024   NA 136 03/17/2024   K 4.5 03/17/2024   CL 100 03/17/2024   CREATININE 0.60 03/17/2024   BUN 12 03/17/2024   CO2 23 03/17/2024   TSH 4.23 12/12/2023   HGBA1C 6.5 12/12/2023   MICROALBUR 2.4 (H) 12/12/2023    CT ANGIO GI BLEED Result Date: 03/17/2024 EXAM: CTA ABDOMEN AND PELVIS WITH CONTRAST 03/17/2024 11:13:00 AM TECHNIQUE: CTA images of the  abdomen and pelvis with 100 mL of iohexol  (OMNIPAQUE ) 350 MG/ML injection intravenous contrast. Three-dimensional MIP/volume rendered formations were performed. Automated exposure control, iterative reconstruction, and/or weight based adjustment of the mA/kV was utilized to reduce the radiation dose to as low as reasonably achievable. COMPARISON: 12/16/2005 CLINICAL HISTORY: Lower GI bleed, recently treated for Giardia, tapeworm, external hemorrhoids on exam. FINDINGS: VASCULATURE: GI BLEED: Subtle intraluminal hyperdensity at the descending/sigmoid junction on arterial and delayed phase images suggesting active extravasation. AORTA: Minimal aortic calcified plaque. No acute finding. No abdominal aortic aneurysm. No dissection. CELIAC TRUNK: No acute finding. No occlusion or significant stenosis. SUPERIOR MESENTERIC ARTERY: No acute finding. No occlusion or significant stenosis. INFERIOR MESENTERIC ARTERY: No acute finding. No occlusion or significant stenosis. RENAL ARTERIES: Single right renal artery, widely patent. 3 left renal arteries, superior dominant, all patent. The middle and inferior left renal arteries have aberrant origins from the aorta, below the level of the IMA. ILIAC ARTERIES: No acute finding. No occlusion or significant stenosis. ABDOMEN/PELVIS: LOWER CHEST: Visualized portion of the lower chest demonstrates no acute abnormality. LIVER: The liver is unremarkable. GALLBLADDER AND BILE DUCTS: Cholecystectomy clips are present, indicating prior cholecystectomy. No biliary ductal dilatation. SPLEEN: The spleen is unremarkable. PANCREAS: The pancreas is unremarkable. ADRENAL GLANDS: Bilateral adrenal glands demonstrate no acute abnormality. KIDNEYS, URETERS AND BLADDER: No stones in the kidneys or ureters. No hydronephrosis. No perinephric or periureteral stranding. Urinary bladder is unremarkable. GI AND BOWEL: Stomach and duodenal sweep demonstrate no acute abnormality. There is no bowel  obstruction. Multiple descending and sigmoid diverticula. Development of subtle intraluminal hyperdensity at the descending/sigmoid junction on arterial and delayed phase images suggesting active extravasation. There are some mild inflammatory/edematous changes at the descending/sigmoid junction suggesting diverticulitis. No abscess or perforation. REPRODUCTIVE: Reproductive organs are unremarkable. PERITONEUM AND RETROPERITONEUM: No ascites or free air. LYMPH NODES: No lymphadenopathy. BONES AND SOFT TISSUES: Mild degenerative disc disease at L4-L5 and S1. 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 No acute soft tissue abnormality. IMPRESSION: 1. Active extravasation at the descending/sigmoid junction, consistent with ongoing lower GI bleeding. 2. Diverticulosis with Mild inflammatory/edematous changes at the descending/sigmoid junction, consistent with diverticulitis. No abscess or free air. Electronically signed by: Katheleen Faes MD 03/17/2024 12:06 PM EST RP Workstation: [id number]       Assessment & Plan:  Tachycardia -     EKG 12-Lead -     CBC with Differential/Platelet -     Comprehensive metabolic panel with GFR  Chest pain, unspecified type Assessment & Plan: Chest and epigastric pain as outlined. Intermittent episodes. Denies acid reflux. No vomiting. Does not appear to be triggered by eating. Woke her from sleep. EKG - SR with ventricular rate 85-90. ?ST depression II and avF. Some non specific changes. Discussed given intermittent episodes of discomfort with recent admission and EKG, the  need for further testing - further evaluation in ED. Pt in agreement. Husband came to transport her to ED. ED notified.    Abnormal liver function Assessment & Plan: Liver - recent scan - unremarkable. Follow liver panel.    Diverticulitis Assessment & Plan: Recent hospitalization as outlined. On augmentin . Lower abdominal discomfort improved.    Dizziness Assessment &  Plan: Intermittent episodes of light headedness. Blood pressure as outlined. Decreased appetite. Decreased po intake. Follow.    Primary hypertension Assessment & Plan: Back on her regular blood pressure medications as outlined. Check metabolic panel.    Paroxysmal A-fib [facility]) Assessment & Plan: S/p ablation.  Followed by cardiology. In SR today. Had episode of increased heart rate prior to coming to appt. EKG as outlined. SR now. Follow. Continue carvedilol .    Type 2 diabetes mellitus with hyperglycemia, without long-term current use of insulin  [facility]) Assessment & Plan: Follow met b and A1c.  Lab Results  Component Value Date   HGBA1C 6.5 12/12/2023     I spent 45 minutes with the patient. Time spent discussing her current concerns and symptoms. Specifically time spent discussing her chest pain and recent hospitalization.  Time also spent discussing further w/up, evaluation and treatment.    Allena Hamilton, MD "

## 2024-03-21 NOTE — Discharge Instructions (Addendum)
 Take the Diflucan  the day that you finish the Augmentin .  Return to the ER for new, worsening, persistent chest or epigastric pain, palpitations, dizziness or lightheadedness, or any other new or worsening symptoms that concern you.

## 2024-03-21 NOTE — ED Triage Notes (Signed)
 Pt comes with c/o tachycardia and some epigastric pain. Pt was recently seen here and admitted for GI issues. Pt did have diarrhea several times today and thinks she might be dehydrated.

## 2024-03-21 NOTE — Assessment & Plan Note (Signed)
 Back on her regular blood pressure medications as outlined. Check metabolic panel.

## 2024-03-21 NOTE — Assessment & Plan Note (Signed)
 Follow met b and A1c.  Lab Results  Component Value Date   HGBA1C 6.5 12/12/2023

## 2024-03-21 NOTE — ED Provider Notes (Signed)
 "  [facility] Provider Note    Event Date/Time   First MD Initiated Contact with Patient 03/21/24 2026     (approximate)   History   Abdominal Pain   HPI  Joy Houston is a 59 y.o. 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  In addition the patient has had intermittent episodes of epigastric or lower chest pain over the course the day today.  She had an episode in the waiting room.  She is not having any pain currently.  The patient states that she has had ongoing GI issues and is also on Augmentin .  She had several episodes of diarrhea earlier today and thinks that she might just be dehydrated, causing the palpitations.  She denies any difficulty breathing.  She does not feel dizzy or lightheaded.  I reviewed the past medical records but the patient was just admitted to the hospitalist service several days ago after presenting with rectal bleeding.  She was evaluated by IR and GI did not require intervention.  Hemoglobin remained stable.  She was started on Augmentin    Physical Exam   Triage Vital Signs: ED Triage Vitals [03/21/24 1746]  Encounter Vitals Group     BP 135/87     Girls Systolic BP Percentile      Girls Diastolic BP Percentile      Boys Systolic BP Percentile      Boys Diastolic BP Percentile      Pulse Rate 77     Resp 18     Temp 98 F (36.7 C)     Temp src      SpO2 97 %     Weight 260 lb (117.9 kg)     Height 5' 6 (1.676 m)     Head Circumference      Peak Flow      Pain Score 0     Pain Loc      Pain Education      Exclude from Growth Chart     Most recent vital signs: Vitals:   03/21/24 1746 03/21/24 2222  BP: 135/87 (!) 170/92  Pulse: 77 77  Resp: 18 18  Temp: 98 F (36.7 C)   SpO2: 97% 97%     General: Alert, well-appearing, no distress.  CV:  Good peripheral  perfusion.  Normal heart sounds. Resp:  Normal effort.  Lungs CTAB. Abd:  No distention.  Other:  No jaundice or scleral icterus.   ED Results / Procedures / Treatments   Labs (all labs ordered are listed, but only abnormal results are displayed) Labs Reviewed  BASIC METABOLIC PANEL WITH GFR - Abnormal; Notable for the following components:      Result Value   Glucose, Bld 124 (*)    All other components within normal limits  CBC - Abnormal; Notable for the following components:   WBC 10.9 (*)    All other components within normal limits  URINALYSIS, ROUTINE W REFLEX MICROSCOPIC - Abnormal; Notable for the following components:   Color, Urine STRAW (*)    APPearance CLEAR (*)    Leukocytes,Ua TRACE (*)    Bacteria, UA RARE (*)    All other components within normal limits  TROPONIN T, HIGH SENSITIVITY  TROPONIN T, HIGH SENSITIVITY     EKG  ED ECG REPORT I, Waylon Cassis, the attending physician, personally viewed and interpreted  this ECG.  Date: 03/21/2024 EKG Time: 1749 Rate: 85 Rhythm: normal sinus rhythm QRS Axis: normal Intervals: normal ST/T Wave abnormalities: none Narrative Interpretation: no evidence of acute ischemia   RADIOLOGY  Chest x-ray: I independently viewed and interpreted the images; there is no focal consolidation or edema  PROCEDURES:  Critical Care performed: No  Procedures   MEDICATIONS ORDERED IN ED: Medications - No data to display   IMPRESSION / MDM / ASSESSMENT AND PLAN / ED COURSE  I reviewed the triage vital signs and the nursing notes.  59 year old female with PMH as noted above presents with palpitations as well as several episodes of atypical lower chest or epigastric pain that is now resolved.  On exam the patient is hypertensive.  Other vital signs are normal.  She is well-appearing.  Physical exam is unremarkable for acute findings.  EKG is nonischemic.  Differential diagnosis includes, but is not limited to,  sinus tachycardia, dehydration, gastritis, gastroenteritis, medication side effect, less likely ACS or other cardiac etiology.  Initial troponin is negative.  Chest x-ray is clear.  BMP and CBC show no acute findings.  We will obtain a repeat troponin and reassess.  Patient's presentation is most consistent with acute complicated illness / injury requiring diagnostic workup.  ----------------------------------------- 11:09 PM on 03/21/2024 -----------------------------------------  Repeat troponin is negative.  The patient remains asymptomatic.  She is stable for discharge home at this time.  I counseled her on the results of the workup and answered all of her questions.  I gave strict return precautions, and she expressed understanding.   FINAL CLINICAL IMPRESSION(S) / ED DIAGNOSES   Final diagnoses:  Epigastric pain  Palpitations     Rx / DC Orders   ED Discharge Orders          Ordered    fluconazole  (DIFLUCAN ) 150 MG tablet   Once        03/21/24 2211             Note:  This document was prepared using Dragon voice recognition software and may include unintentional dictation errors.    Jacolyn Pae, MD 03/21/24 2309  "

## 2024-03-21 NOTE — Assessment & Plan Note (Addendum)
 Chest and epigastric pain as outlined. Intermittent episodes. Denies acid reflux. No vomiting. Does not appear to be triggered by eating. Woke her from sleep. EKG - SR with ventricular rate 85-90. ?ST depression II and avF. Some non specific changes. Discussed given intermittent episodes of discomfort with recent admission and EKG, the need for further testing - further evaluation in ED. Pt in agreement. Husband came to transport her to ED. ED notified.

## 2024-03-21 NOTE — Assessment & Plan Note (Signed)
 Liver - recent scan - unremarkable. Follow liver panel.

## 2024-03-22 LAB — CBC WITH DIFFERENTIAL/PLATELET
Basophils Absolute: 0.1 10*3/uL (ref 0.0–0.1)
Basophils Relative: 1.2 % (ref 0.0–3.0)
Eosinophils Absolute: 0.1 10*3/uL (ref 0.0–0.7)
Eosinophils Relative: 1.3 % (ref 0.0–5.0)
HCT: 42.7 % (ref 36.0–46.0)
Hemoglobin: 14.4 g/dL (ref 12.0–15.0)
Lymphocytes Relative: 19.2 % (ref 12.0–46.0)
Lymphs Abs: 1.9 10*3/uL (ref 0.7–4.0)
MCHC: 33.7 g/dL (ref 30.0–36.0)
MCV: 92.3 fl (ref 78.0–100.0)
Monocytes Absolute: 0.7 10*3/uL (ref 0.1–1.0)
Monocytes Relative: 6.7 % (ref 3.0–12.0)
Neutro Abs: 7.1 10*3/uL (ref 1.4–7.7)
Neutrophils Relative %: 71.6 % (ref 43.0–77.0)
Platelets: 279 10*3/uL (ref 150.0–400.0)
RBC: 4.62 Mil/uL (ref 3.87–5.11)
RDW: 13.5 % (ref 11.5–15.5)
WBC: 9.9 10*3/uL (ref 4.0–10.5)

## 2024-03-22 LAB — COMPREHENSIVE METABOLIC PANEL WITH GFR
ALT: 48 U/L — ABNORMAL HIGH (ref 3–35)
AST: 44 U/L — ABNORMAL HIGH (ref 5–37)
Albumin: 4.2 g/dL (ref 3.5–5.2)
Alkaline Phosphatase: 64 U/L (ref 39–117)
BUN: 10 mg/dL (ref 6–23)
CO2: 25 meq/L (ref 19–32)
Calcium: 9.6 mg/dL (ref 8.4–10.5)
Chloride: 102 meq/L (ref 96–112)
Creatinine, Ser: 0.7 mg/dL (ref 0.40–1.20)
GFR: 96.55 mL/min
Glucose, Bld: 168 mg/dL — ABNORMAL HIGH (ref 70–99)
Potassium: 3.8 meq/L (ref 3.5–5.1)
Sodium: 139 meq/L (ref 135–145)
Total Bilirubin: 0.4 mg/dL (ref 0.2–1.2)
Total Protein: 7.4 g/dL (ref 6.0–8.3)

## 2024-03-24 ENCOUNTER — Encounter: Payer: Self-pay | Admitting: Internal Medicine

## 2024-03-24 ENCOUNTER — Encounter (HOSPITAL_BASED_OUTPATIENT_CLINIC_OR_DEPARTMENT_OTHER): Payer: Self-pay | Admitting: Cardiovascular Disease

## 2024-03-25 ENCOUNTER — Other Ambulatory Visit (HOSPITAL_BASED_OUTPATIENT_CLINIC_OR_DEPARTMENT_OTHER): Payer: Self-pay | Admitting: *Deleted

## 2024-03-25 MED ORDER — CARVEDILOL 12.5 MG PO TABS: 12.5000 mg | ORAL_TABLET | Freq: Two times a day (BID) | ORAL | 1 refills | Status: AC

## 2024-03-25 MED ORDER — CARVEDILOL 12.5 MG PO TABS: 12.5000 mg | ORAL_TABLET | Freq: Two times a day (BID) | ORAL | 1 refills | Status: DC

## 2024-03-26 NOTE — Telephone Encounter (Signed)
 Please call her and clarify current symptoms. Is she having any urinary symptoms. Was previously on augmentin . Augentin can cover UTI, but if persistent symptoms, will need to recheck urine and culture.

## 2024-03-27 ENCOUNTER — Ambulatory Visit

## 2024-03-27 DIAGNOSIS — R42 Dizziness and giddiness: Secondary | ICD-10-CM

## 2024-03-27 DIAGNOSIS — R000 Tachycardia, unspecified: Secondary | ICD-10-CM

## 2024-03-27 NOTE — Telephone Encounter (Signed)
 Order signed for zio monitor.

## 2024-03-28 NOTE — Telephone Encounter (Signed)
 Called and spoke with pt, read pt Dr. Freda message.  Pt reported that the only urinary symptom that she was having was frequency but this is not a new problem has been going on for years, she is also trying to increase her water intake.  Offered pt a lab appointment for urine samples.  Pt declined appointment.  Would like to wait until her appointment on 04/09/24.  Encouraged pt to call back if she started to experience any worrisome urinary symptoms.  Pt verbalized understanding.

## 2024-04-09 ENCOUNTER — Ambulatory Visit: Admitting: Internal Medicine

## 2024-04-16 ENCOUNTER — Ambulatory Visit

## 2024-04-24 ENCOUNTER — Encounter (HOSPITAL_BASED_OUTPATIENT_CLINIC_OR_DEPARTMENT_OTHER): Admitting: Cardiovascular Disease

## 2025-01-27 ENCOUNTER — Encounter

## 2025-01-27 ENCOUNTER — Encounter: Admitting: Dermatology
